# Patient Record
Sex: Female | Born: 1955 | Race: Black or African American | Hispanic: No | Marital: Married | Smoking: Never smoker
Health system: Southern US, Community
[De-identification: ages and names within clinical notes are randomized; demographics above are authoritative.]

## PROBLEM LIST (undated history)

## (undated) DIAGNOSIS — I10 Essential (primary) hypertension: Secondary | ICD-10-CM

## (undated) DIAGNOSIS — R51 Headache: Secondary | ICD-10-CM

## (undated) DIAGNOSIS — G43909 Migraine, unspecified, not intractable, without status migrainosus: Secondary | ICD-10-CM

## (undated) DIAGNOSIS — F32A Depression, unspecified: Secondary | ICD-10-CM

## (undated) DIAGNOSIS — Z9289 Personal history of other medical treatment: Secondary | ICD-10-CM

## (undated) DIAGNOSIS — R569 Unspecified convulsions: Secondary | ICD-10-CM

## (undated) DIAGNOSIS — R519 Headache, unspecified: Secondary | ICD-10-CM

## (undated) DIAGNOSIS — F329 Major depressive disorder, single episode, unspecified: Secondary | ICD-10-CM

## (undated) DIAGNOSIS — K259 Gastric ulcer, unspecified as acute or chronic, without hemorrhage or perforation: Secondary | ICD-10-CM

## (undated) DIAGNOSIS — R55 Syncope and collapse: Secondary | ICD-10-CM

## (undated) DIAGNOSIS — M1990 Unspecified osteoarthritis, unspecified site: Secondary | ICD-10-CM

## (undated) DIAGNOSIS — C50919 Malignant neoplasm of unspecified site of unspecified female breast: Secondary | ICD-10-CM

## (undated) HISTORY — DX: Malignant neoplasm of unspecified site of unspecified female breast: C50.919

## (undated) HISTORY — DX: Depression, unspecified: F32.A

## (undated) HISTORY — DX: Essential (primary) hypertension: I10

## (undated) HISTORY — DX: Major depressive disorder, single episode, unspecified: F32.9

## (undated) HISTORY — DX: Unspecified osteoarthritis, unspecified site: M19.90

## (undated) HISTORY — PX: ABDOMINAL HYSTERECTOMY: SHX81

## (undated) HISTORY — DX: Headache: R51

## (undated) HISTORY — DX: Personal history of other medical treatment: Z92.89

## (undated) HISTORY — DX: Migraine, unspecified, not intractable, without status migrainosus: G43.909

## (undated) HISTORY — DX: Syncope and collapse: R55

## (undated) HISTORY — DX: Headache, unspecified: R51.9

---

## 2013-06-09 ENCOUNTER — Ambulatory Visit: Payer: Self-pay | Admitting: Internal Medicine

## 2013-07-04 ENCOUNTER — Ambulatory Visit (INDEPENDENT_AMBULATORY_CARE_PROVIDER_SITE_OTHER)
Admission: RE | Admit: 2013-07-04 | Discharge: 2013-07-04 | Disposition: A | Discharge status: Home / Self Care | Payer: BC Managed Care – PPO | Source: Ambulatory Visit | Attending: Internal Medicine | Admitting: Internal Medicine

## 2013-07-04 ENCOUNTER — Encounter: Payer: Self-pay | Admitting: Internal Medicine

## 2013-07-04 ENCOUNTER — Ambulatory Visit (INDEPENDENT_AMBULATORY_CARE_PROVIDER_SITE_OTHER): Payer: BC Managed Care – PPO | Admitting: Internal Medicine

## 2013-07-04 VITALS — BP 162/110 | HR 81 | Temp 99.1°F | Ht 64.5 in | Wt 181.2 lb

## 2013-07-04 DIAGNOSIS — F411 Generalized anxiety disorder: Secondary | ICD-10-CM | POA: Insufficient documentation

## 2013-07-04 DIAGNOSIS — M1990 Unspecified osteoarthritis, unspecified site: Secondary | ICD-10-CM | POA: Insufficient documentation

## 2013-07-04 DIAGNOSIS — Z0000 Encounter for general adult medical examination without abnormal findings: Secondary | ICD-10-CM

## 2013-07-04 DIAGNOSIS — M25539 Pain in unspecified wrist: Secondary | ICD-10-CM

## 2013-07-04 DIAGNOSIS — R079 Chest pain, unspecified: Secondary | ICD-10-CM

## 2013-07-04 DIAGNOSIS — F329 Major depressive disorder, single episode, unspecified: Secondary | ICD-10-CM | POA: Insufficient documentation

## 2013-07-04 DIAGNOSIS — I10 Essential (primary) hypertension: Secondary | ICD-10-CM

## 2013-07-04 DIAGNOSIS — G43909 Migraine, unspecified, not intractable, without status migrainosus: Secondary | ICD-10-CM

## 2013-07-04 DIAGNOSIS — M25531 Pain in right wrist: Secondary | ICD-10-CM | POA: Insufficient documentation

## 2013-07-04 MED ORDER — NAPROXEN 500 MG PO TABS: 500.0000 mg | ORAL_TABLET | Freq: Two times a day (BID) | ORAL | Status: DC

## 2013-07-04 MED ORDER — ESCITALOPRAM OXALATE 10 MG PO TABS: 10.0000 mg | ORAL_TABLET | Freq: Every day | ORAL | Status: DC

## 2013-07-04 MED ORDER — ASPIRIN 81 MG PO TBEC: 81.0000 mg | DELAYED_RELEASE_TABLET | Freq: Every day | ORAL | Status: DC

## 2013-07-04 MED ORDER — SUMATRIPTAN SUCCINATE 100 MG PO TABS: 100.0000 mg | ORAL_TABLET | ORAL | Status: DC | PRN

## 2013-07-04 MED ORDER — LISINOPRIL 20 MG PO TABS: 20.0000 mg | ORAL_TABLET | Freq: Every day | ORAL | Status: DC

## 2013-07-04 NOTE — Patient Instructions (Addendum)
Your EKG was OK today Please take all new medication as prescribed- the antiinflammatory for pain, lisinopril for blood pressure, generic imitrex for migraine, and Lexapro for nerves Please also start Aspirin 81 mg per day (enteric coated only) to reduce risk of stroke and heart disease You will be contacted regarding the referral for: hand surgury Please go to the XRAY Department in the Basement (go straight as you get off the elevator) for the x-ray testing You will be contacted by phone if any changes need to be made immediately.  Otherwise, you will receive a letter about your results with an explanation, but please check with MyChart first.  Please remember to sign up for My Chart if you have not done so, as this will be important to you in the future with finding out test results, communicating by private email, and scheduling acute appointments online when needed.  Please return in 1 months, or sooner if needed, with Lab testing done 3-5 days before

## 2013-07-04 NOTE — Assessment & Plan Note (Signed)
For imitrex prn,  to f/u any worsening symptoms or concerns  

## 2013-07-04 NOTE — Assessment & Plan Note (Signed)
For lexapro 10 qd  Note:  Total time for pt hx, exam, review of record with pt in the room, determination of diagnoses and plan for further eval and tx is > 40 min, with over 50% spent in coordination and counseling of patient

## 2013-07-04 NOTE — Assessment & Plan Note (Signed)
Atypical, suspect MSK, for nsaid prn, ECG reviewed as per emr, and for cxr as well, with labs to be done prior  To next visit

## 2013-07-04 NOTE — Assessment & Plan Note (Signed)
To re-start med as before

## 2013-07-04 NOTE — Assessment & Plan Note (Signed)
Rather severe lateral wrist bony swelling, some soft tissue swelling - for nsaid, hand surgury referral, to f/u any worsening symptoms or concerns

## 2013-07-04 NOTE — Progress Notes (Signed)
  Subjective:    Patient ID: Mary [name], female    DOB: December 30, 1955, 57 y.o.   MRN: [id number]  HPI  Here to establish as new pt, c/o left lower anterior soreness/sharpness x 1 mo, mild, pleuritic, without fever, ST , cough, Pt denies chest pain, increased sob or doe, wheezing, orthopnea, PND, increased LE swelling, palpitations, dizziness or syncope, except for recent ankle fx x 3 yrs, used crutches, now with 2 knows to the right lateral chest near the armpit.  Not working, Has had mild worsening depressive symptoms x 2 yrs, without suicidal ideation, or panic; has ongoing anxiety, also increased recently. Treated years ago with paxil and another that did seem to help.  Does c/o ongoing fatigue, but denies signficant daytime hypersomnolence except for past 1 wk, does snore at night, no apnea per pt per husband.  Also with recurrent migraines, worse with more stress, has been freq for 6 mo, last tx years ago. No med use for BP for over 1 yr, first dx x 7 yrs, remembers being on one med - lisinopril from [facility]. Past Medical History  Diagnosis Date  . Arthritis   . Depression   . Fainting   . Headache   . Hypertension   . Migraines   . History of blood transfusion    No past surgical history on file.  reports that she has never smoked. She has never used smokeless tobacco. She reports that she does not drink alcohol or use illicit drugs. family history includes Arthritis in her other; Heart disease in her other; Hypertension in her others; and Stroke in her other. Allergies  Allergen Reactions  . Penicillins     swelling   No current outpatient prescriptions on file prior to visit.   No current facility-administered medications on file prior to visit.   Review of Systems  Constitutional: Negative for unexpected weight change, or unusual diaphoresis  HENT: Negative for tinnitus.   Eyes: Negative for photophobia and visual disturbance.  Respiratory: Negative for choking and stridor.    Gastrointestinal: Negative for vomiting and blood in stool.  Genitourinary: Negative for hematuria and decreased urine volume.  Musculoskeletal: Negative for acute joint swelling Skin: Negative for color change and wound.  Neurological: Negative for tremors and numbness other than noted  Psychiatric/Behavioral: Negative for decreased concentration or  hyperactivity.       Objective:   Physical Exam BP 162/110  Pulse 81  Temp(Src) 99.1 F (37.3 C) (Oral)  Ht 5' 4.5" (1.638 m)  Wt 181 lb 4 oz (82.214 kg)  BMI 30.64 kg/m2  SpO2 97% VS noted,  Constitutional: Pt appears well-developed and well-nourished.  HENT: Head: NCAT.  Right Ear: External ear normal.  Left Ear: External ear normal.  Eyes: Conjunctivae and EOM are normal. Pupils are equal, round, and reactive to light.  Neck: Normal range of motion. Neck supple.  Cardiovascular: Normal rate and regular rhythm.   Pulmonary/Chest: Effort normal and breath sounds normal.  Abd:  Soft, NT, non-distended, + BS No overt costal margin tender Right wrist with medial bony and soft tissue 2-3+ swelling, mild tender Neurological: Pt is alert. Not confused , motor 5/5, dtr intact Skin: Skin is warm. No erythema.  Psychiatric: Pt behavior is normal. Thought content normal. mild nervous, somewhat decrased affect    Assessment & Plan:

## 2013-08-04 ENCOUNTER — Ambulatory Visit: Payer: BC Managed Care – PPO

## 2013-08-04 ENCOUNTER — Other Ambulatory Visit (INDEPENDENT_AMBULATORY_CARE_PROVIDER_SITE_OTHER): Payer: BC Managed Care – PPO

## 2013-08-04 ENCOUNTER — Ambulatory Visit (INDEPENDENT_AMBULATORY_CARE_PROVIDER_SITE_OTHER): Payer: BC Managed Care – PPO | Admitting: Internal Medicine

## 2013-08-04 ENCOUNTER — Encounter: Payer: Self-pay | Admitting: Internal Medicine

## 2013-08-04 VITALS — BP 152/110 | HR 60 | Temp 98.5°F | Ht 64.0 in | Wt 178.0 lb

## 2013-08-04 DIAGNOSIS — Z0000 Encounter for general adult medical examination without abnormal findings: Secondary | ICD-10-CM

## 2013-08-04 DIAGNOSIS — Z23 Encounter for immunization: Secondary | ICD-10-CM

## 2013-08-04 DIAGNOSIS — I10 Essential (primary) hypertension: Secondary | ICD-10-CM

## 2013-08-04 DIAGNOSIS — D649 Anemia, unspecified: Secondary | ICD-10-CM

## 2013-08-04 LAB — URINALYSIS, ROUTINE W REFLEX MICROSCOPIC
Ketones, ur: NEGATIVE
Total Protein, Urine: NEGATIVE
Urine Glucose: NEGATIVE

## 2013-08-04 LAB — HEPATIC FUNCTION PANEL
Alkaline Phosphatase: 72 U/L (ref 39–117)
Bilirubin, Direct: 0.1 mg/dL (ref 0.0–0.3)
Total Bilirubin: 0.6 mg/dL (ref 0.3–1.2)

## 2013-08-04 LAB — BASIC METABOLIC PANEL
CO2: 29 mEq/L (ref 19–32)
Calcium: 9.7 mg/dL (ref 8.4–10.5)
Creatinine, Ser: 0.7 mg/dL (ref 0.4–1.2)
Sodium: 140 mEq/L (ref 135–145)

## 2013-08-04 LAB — CBC WITH DIFFERENTIAL/PLATELET
Basophils Absolute: 0 10*3/uL (ref 0.0–0.1)
Basophils Relative: 0.9 % (ref 0.0–3.0)
Eosinophils Absolute: 0.1 10*3/uL (ref 0.0–0.7)
HCT: 35 % — ABNORMAL LOW (ref 36.0–46.0)
Hemoglobin: 11.4 g/dL — ABNORMAL LOW (ref 12.0–15.0)
Lymphocytes Relative: 40.1 % (ref 12.0–46.0)
Lymphs Abs: 2.2 10*3/uL (ref 0.7–4.0)
MCHC: 32.7 g/dL (ref 30.0–36.0)
MCV: 88.8 fl (ref 78.0–100.0)
Monocytes Absolute: 0.5 10*3/uL (ref 0.1–1.0)
Neutro Abs: 2.6 10*3/uL (ref 1.4–7.7)
RBC: 3.94 Mil/uL (ref 3.87–5.11)
RDW: 14.6 % (ref 11.5–14.6)

## 2013-08-04 LAB — LIPID PANEL
HDL: 36.6 mg/dL — ABNORMAL LOW (ref >39.00)
LDL Cholesterol: 84 mg/dL (ref 0–99)
Total CHOL/HDL Ratio: 4
Triglycerides: 81 mg/dL (ref 0.0–149.0)

## 2013-08-04 MED ORDER — LISINOPRIL 40 MG PO TABS: 40.0000 mg | ORAL_TABLET | Freq: Every day | ORAL | Status: DC

## 2013-08-04 MED ORDER — AMLODIPINE BESYLATE 5 MG PO TABS: 5.0000 mg | ORAL_TABLET | Freq: Every day | ORAL | Status: DC

## 2013-08-04 NOTE — Patient Instructions (Addendum)
You had the tetanus shot today Please remember to followup with your GYN for the mammogram ( to consider [facility] on [address]) You will be contacted regarding the referral for: colonoscopy You had blood work done today You will be contacted by phone if any changes need to be made immediately.  Otherwise, you will receive a letter about your results with an explanation, but please check with MyChart first. Please take all new medication as prescribed - the increased lisinopril to 40 mg, and the amlodipine 5 mg per day Please continue all other medications as before, and refills have been done if requested. Please continue your efforts at being more active, low cholesterol diet, and weight control. You are otherwise up to date with prevention measures today.  Please return in 6 months

## 2013-08-04 NOTE — Assessment & Plan Note (Signed)

## 2013-08-04 NOTE — Assessment & Plan Note (Signed)
Uncontrolled, to incr lisinopril to 40 qd, add amlodpine, f/u BP at home , f/u next visit

## 2013-08-04 NOTE — Progress Notes (Signed)
Subjective:    Patient ID: Mary [name], female    DOB: [birth year], 57 y.o.   MRN: [id number]  HPI  Here for wellness and f/u;  Overall doing ok;  Pt denies CP, worsening SOB, DOE, wheezing, orthopnea, PND, worsening LE edema, palpitations, dizziness or syncope.  Pt denies neurological change such as new headache, facial or extremity weakness.  Pt denies polydipsia, polyuria, or low sugar symptoms. Pt states overall good compliance with treatment and medications, good tolerability, and has been trying to follow lower cholesterol diet.  Pt denies worsening depressive symptoms, suicidal ideation or panic. No fever, night sweats, wt loss, loss of appetite, or other constitutional symptoms.  Pt states good ability with ADL's, has low fall risk, home safety reviewed and adequate, no other significant changes in hearing or vision, and only occasionally active with exercise. Had labs done prior to visit, not available yet. Past Medical History  Diagnosis Date  . Arthritis   . Depression   . Fainting   . Headache   . Hypertension   . Migraines   . History of blood transfusion    No past surgical history on file.  reports that she has never smoked. She has never used smokeless tobacco. She reports that she does not drink alcohol or use illicit drugs. family history includes Arthritis in her other; Heart disease in her other; Hypertension in her others; and Stroke in her other. Allergies  Allergen Reactions  . Penicillins     swelling   Current Outpatient Prescriptions on File Prior to Visit  Medication Sig Dispense Refill  . aspirin 81 MG EC tablet Take 1 tablet (81 mg total) by mouth daily. Swallow whole.  30 tablet  12  . escitalopram (LEXAPRO) 10 MG tablet Take 1 tablet (10 mg total) by mouth daily.  90 tablet  3  . naproxen (NAPROSYN) 500 MG tablet Take 1 tablet (500 mg total) by mouth 2 (two) times daily with a meal.  60 tablet  3  . SUMAtriptan (IMITREX) 100 MG tablet Take 1 tablet (100 mg  total) by mouth every 2 (two) hours as needed for migraine.  10 tablet  3   No current facility-administered medications on file prior to visit.   Review of Systems  Constitutional: Negative for diaphoresis, activity change, appetite change or unexpected weight change.  HENT: Negative for hearing loss, ear pain, facial swelling, mouth sores and neck stiffness.   Eyes: Negative for pain, redness and visual disturbance.  Respiratory: Negative for shortness of breath and wheezing.   Cardiovascular: Negative for chest pain and palpitations.  Gastrointestinal: Negative for diarrhea, blood in stool, abdominal distention or other pain Genitourinary: Negative for hematuria, flank pain or change in urine volume.  Musculoskeletal: Negative for myalgias and joint swelling.  Skin: Negative for color change and wound.  Neurological: Negative for syncope and numbness. other than noted Hematological: Negative for adenopathy.  Psychiatric/Behavioral: Negative for hallucinations, self-injury, decreased concentration and agitation.      Objective:   Physical Exam BP 152/110  Pulse 60  Temp(Src) 98.5 F (36.9 C) (Oral)  Ht 5\' 4"  (1.626 m)  Wt 178 lb (80.74 kg)  BMI 30.54 kg/m2  SpO2 97% VS noted,  Constitutional: Pt is oriented to person, place, and time. Appears well-developed and well-nourished.  Head: Normocephalic and atraumatic.  Right Ear: External ear normal.  Left Ear: External ear normal.  Nose: Nose normal.  Mouth/Throat: Oropharynx is clear and moist.  Eyes: Conjunctivae and EOM are normal. Pupils  are equal, round, and reactive to light.  Neck: Normal range of motion. Neck supple. No JVD present. No tracheal deviation present.  Cardiovascular: Normal rate, regular rhythm, normal heart sounds and intact distal pulses.   Pulmonary/Chest: Effort normal and breath sounds normal.  Abdominal: Soft. Bowel sounds are normal. There is no tenderness. No HSM  Musculoskeletal: Normal range of  motion. Exhibits no edema.  Lymphadenopathy:  Has no cervical adenopathy.  Neurological: Pt is alert and oriented to person, place, and time. Pt has normal reflexes. No cranial nerve deficit.  Skin: Skin is warm and dry. No rash noted.  Psychiatric:  Has  normal mood and affect. Behavior is normal.     Assessment & Plan:

## 2013-08-09 ENCOUNTER — Telehealth: Payer: Self-pay | Admitting: Internal Medicine

## 2013-08-09 NOTE — Telephone Encounter (Signed)
rec'd records from [facility],Forward 4 page's to Dr. Jonny Ruiz

## 2013-08-16 ENCOUNTER — Encounter: Payer: Self-pay | Admitting: Internal Medicine

## 2013-08-22 ENCOUNTER — Telehealth: Payer: Self-pay

## 2013-08-22 MED ORDER — ESCITALOPRAM OXALATE 20 MG PO TABS: 20.0000 mg | ORAL_TABLET | Freq: Every day | ORAL | Status: DC

## 2013-08-22 NOTE — Telephone Encounter (Signed)
Ok to increase lexapro to 20 mg qd

## 2013-08-22 NOTE — Telephone Encounter (Signed)
The patient was in the office today with her sister and mom ([name] Eliberto Ivory and Zane Herald) and is requesting something to be sent to [facility] for her nerves.

## 2013-10-26 ENCOUNTER — Encounter: Payer: Self-pay | Admitting: Internal Medicine

## 2013-10-31 ENCOUNTER — Encounter: Payer: BC Managed Care – PPO | Admitting: Internal Medicine

## 2014-01-03 ENCOUNTER — Encounter: Payer: BC Managed Care – PPO | Admitting: Internal Medicine

## 2014-02-09 ENCOUNTER — Ambulatory Visit: Payer: BC Managed Care – PPO | Admitting: Internal Medicine

## 2014-04-03 ENCOUNTER — Ambulatory Visit: Payer: BC Managed Care – PPO | Admitting: Internal Medicine

## 2014-04-04 ENCOUNTER — Ambulatory Visit: Payer: BC Managed Care – PPO | Admitting: Internal Medicine

## 2014-04-04 DIAGNOSIS — Z0289 Encounter for other administrative examinations: Secondary | ICD-10-CM

## 2014-04-05 ENCOUNTER — Ambulatory Visit (INDEPENDENT_AMBULATORY_CARE_PROVIDER_SITE_OTHER): Payer: Medicaid Other | Admitting: Internal Medicine

## 2014-04-05 ENCOUNTER — Encounter (HOSPITAL_COMMUNITY): Payer: Self-pay | Admitting: Emergency Medicine

## 2014-04-05 ENCOUNTER — Inpatient Hospital Stay (HOSPITAL_COMMUNITY)
Admission: EM | Admit: 2014-04-05 | Discharge: 2014-04-10 | DRG: 871 | Disposition: A | Discharge status: Home / Self Care | Payer: BC Managed Care – PPO | Attending: Internal Medicine | Admitting: Internal Medicine

## 2014-04-05 ENCOUNTER — Encounter: Payer: Self-pay | Admitting: Internal Medicine

## 2014-04-05 ENCOUNTER — Ambulatory Visit (INDEPENDENT_AMBULATORY_CARE_PROVIDER_SITE_OTHER)
Admission: RE | Admit: 2014-04-05 | Discharge: 2014-04-05 | Disposition: A | Discharge status: Home / Self Care | Payer: BC Managed Care – PPO | Source: Ambulatory Visit | Attending: Internal Medicine | Admitting: Internal Medicine

## 2014-04-05 VITALS — BP 140/90 | HR 118 | Temp 100.3°F | Ht 64.0 in | Wt 169.2 lb

## 2014-04-05 DIAGNOSIS — Z823 Family history of stroke: Secondary | ICD-10-CM

## 2014-04-05 DIAGNOSIS — E43 Unspecified severe protein-calorie malnutrition: Secondary | ICD-10-CM

## 2014-04-05 DIAGNOSIS — R509 Fever, unspecified: Secondary | ICD-10-CM

## 2014-04-05 DIAGNOSIS — R109 Unspecified abdominal pain: Secondary | ICD-10-CM

## 2014-04-05 DIAGNOSIS — M1990 Unspecified osteoarthritis, unspecified site: Secondary | ICD-10-CM

## 2014-04-05 DIAGNOSIS — R5383 Other fatigue: Secondary | ICD-10-CM

## 2014-04-05 DIAGNOSIS — F411 Generalized anxiety disorder: Secondary | ICD-10-CM

## 2014-04-05 DIAGNOSIS — M25559 Pain in unspecified hip: Secondary | ICD-10-CM

## 2014-04-05 DIAGNOSIS — R188 Other ascites: Secondary | ICD-10-CM | POA: Diagnosis present

## 2014-04-05 DIAGNOSIS — M549 Dorsalgia, unspecified: Secondary | ICD-10-CM

## 2014-04-05 DIAGNOSIS — R7401 Elevation of levels of liver transaminase levels: Secondary | ICD-10-CM | POA: Diagnosis present

## 2014-04-05 DIAGNOSIS — M25551 Pain in right hip: Secondary | ICD-10-CM

## 2014-04-05 DIAGNOSIS — D72829 Elevated white blood cell count, unspecified: Secondary | ICD-10-CM

## 2014-04-05 DIAGNOSIS — R10817 Generalized abdominal tenderness: Secondary | ICD-10-CM

## 2014-04-05 DIAGNOSIS — C787 Secondary malignant neoplasm of liver and intrahepatic bile duct: Secondary | ICD-10-CM | POA: Diagnosis present

## 2014-04-05 DIAGNOSIS — F329 Major depressive disorder, single episode, unspecified: Secondary | ICD-10-CM | POA: Diagnosis present

## 2014-04-05 DIAGNOSIS — R42 Dizziness and giddiness: Secondary | ICD-10-CM | POA: Diagnosis present

## 2014-04-05 DIAGNOSIS — Z0000 Encounter for general adult medical examination without abnormal findings: Secondary | ICD-10-CM

## 2014-04-05 DIAGNOSIS — R5381 Other malaise: Secondary | ICD-10-CM | POA: Diagnosis present

## 2014-04-05 DIAGNOSIS — R7402 Elevation of levels of lactic acid dehydrogenase (LDH): Secondary | ICD-10-CM | POA: Diagnosis present

## 2014-04-05 DIAGNOSIS — D649 Anemia, unspecified: Secondary | ICD-10-CM | POA: Diagnosis present

## 2014-04-05 DIAGNOSIS — C7800 Secondary malignant neoplasm of unspecified lung: Secondary | ICD-10-CM | POA: Diagnosis present

## 2014-04-05 DIAGNOSIS — F3289 Other specified depressive episodes: Secondary | ICD-10-CM | POA: Diagnosis present

## 2014-04-05 DIAGNOSIS — R748 Abnormal levels of other serum enzymes: Secondary | ICD-10-CM | POA: Diagnosis present

## 2014-04-05 DIAGNOSIS — C7951 Secondary malignant neoplasm of bone: Secondary | ICD-10-CM | POA: Diagnosis present

## 2014-04-05 DIAGNOSIS — Z7982 Long term (current) use of aspirin: Secondary | ICD-10-CM

## 2014-04-05 DIAGNOSIS — Z8711 Personal history of peptic ulcer disease: Secondary | ICD-10-CM

## 2014-04-05 DIAGNOSIS — C7952 Secondary malignant neoplasm of bone marrow: Secondary | ICD-10-CM

## 2014-04-05 DIAGNOSIS — Z79899 Other long term (current) drug therapy: Secondary | ICD-10-CM

## 2014-04-05 DIAGNOSIS — N6310 Unspecified lump in the right breast, unspecified quadrant: Secondary | ICD-10-CM

## 2014-04-05 DIAGNOSIS — M8448XA Pathological fracture, other site, initial encounter for fracture: Secondary | ICD-10-CM | POA: Diagnosis present

## 2014-04-05 DIAGNOSIS — F32A Depression, unspecified: Secondary | ICD-10-CM

## 2014-04-05 DIAGNOSIS — N63 Unspecified lump in unspecified breast: Secondary | ICD-10-CM

## 2014-04-05 DIAGNOSIS — R55 Syncope and collapse: Secondary | ICD-10-CM

## 2014-04-05 DIAGNOSIS — N61 Mastitis without abscess: Secondary | ICD-10-CM

## 2014-04-05 DIAGNOSIS — R000 Tachycardia, unspecified: Secondary | ICD-10-CM | POA: Diagnosis present

## 2014-04-05 DIAGNOSIS — I10 Essential (primary) hypertension: Secondary | ICD-10-CM

## 2014-04-05 DIAGNOSIS — C50419 Malignant neoplasm of upper-outer quadrant of unspecified female breast: Secondary | ICD-10-CM | POA: Diagnosis present

## 2014-04-05 DIAGNOSIS — Z6828 Body mass index (BMI) 28.0-28.9, adult: Secondary | ICD-10-CM

## 2014-04-05 DIAGNOSIS — L0390 Cellulitis, unspecified: Secondary | ICD-10-CM | POA: Diagnosis present

## 2014-04-05 DIAGNOSIS — C779 Secondary and unspecified malignant neoplasm of lymph node, unspecified: Secondary | ICD-10-CM | POA: Diagnosis present

## 2014-04-05 DIAGNOSIS — R740 Nonspecific elevation of levels of transaminase and lactic acid dehydrogenase [LDH]: Secondary | ICD-10-CM

## 2014-04-05 DIAGNOSIS — A419 Sepsis, unspecified organism: Principal | ICD-10-CM

## 2014-04-05 DIAGNOSIS — G43909 Migraine, unspecified, not intractable, without status migrainosus: Secondary | ICD-10-CM

## 2014-04-05 HISTORY — DX: Gastric ulcer, unspecified as acute or chronic, without hemorrhage or perforation: K25.9

## 2014-04-05 LAB — COMPREHENSIVE METABOLIC PANEL
ALT: 44 U/L — ABNORMAL HIGH (ref 0–35)
AST: 85 U/L — ABNORMAL HIGH (ref 0–37)
Albumin: 2.9 g/dL — ABNORMAL LOW (ref 3.5–5.2)
Alkaline Phosphatase: 170 U/L — ABNORMAL HIGH (ref 39–117)
BUN: 17 mg/dL (ref 6–23)
CO2: 22 meq/L (ref 19–32)
CREATININE: 0.94 mg/dL (ref 0.50–1.10)
Calcium: 9.7 mg/dL (ref 8.4–10.5)
Chloride: 97 mEq/L (ref 96–112)
GFR calc Af Amer: 77 mL/min — ABNORMAL LOW (ref >90)
GFR, EST NON AFRICAN AMERICAN: 66 mL/min — AB (ref >90)
Glucose, Bld: 102 mg/dL — ABNORMAL HIGH (ref 70–99)
Potassium: 4.3 mEq/L (ref 3.7–5.3)
Sodium: 134 mEq/L — ABNORMAL LOW (ref 137–147)
Total Bilirubin: 1.2 mg/dL (ref 0.3–1.2)
Total Protein: 7.4 g/dL (ref 6.0–8.3)

## 2014-04-05 LAB — URINE MICROSCOPIC-ADD ON

## 2014-04-05 LAB — URINALYSIS, ROUTINE W REFLEX MICROSCOPIC
Glucose, UA: NEGATIVE mg/dL
Ketones, ur: 15 mg/dL — AB
NITRITE: NEGATIVE
Specific Gravity, Urine: 1.023 (ref 1.005–1.030)
UROBILINOGEN UA: 1 mg/dL (ref 0.0–1.0)
pH: 6 (ref 5.0–8.0)

## 2014-04-05 LAB — CBC WITH DIFFERENTIAL/PLATELET
Basophils Absolute: 0 10*3/uL (ref 0.0–0.1)
Basophils Relative: 0 % (ref 0–1)
EOS ABS: 0 10*3/uL (ref 0.0–0.7)
EOS PCT: 0 % (ref 0–5)
HCT: 27.8 % — ABNORMAL LOW (ref 36.0–46.0)
HEMOGLOBIN: 9.5 g/dL — AB (ref 12.0–15.0)
LYMPHS ABS: 1.3 10*3/uL (ref 0.7–4.0)
Lymphocytes Relative: 8 % — ABNORMAL LOW (ref 12–46)
MCH: 30.7 pg (ref 26.0–34.0)
MCHC: 34.2 g/dL (ref 30.0–36.0)
MCV: 90 fL (ref 78.0–100.0)
Monocytes Absolute: 2.2 10*3/uL — ABNORMAL HIGH (ref 0.1–1.0)
Monocytes Relative: 13 % — ABNORMAL HIGH (ref 3–12)
NEUTROS PCT: 79 % — AB (ref 43–77)
Neutro Abs: 13.1 10*3/uL — ABNORMAL HIGH (ref 1.7–7.7)
Platelets: 343 10*3/uL (ref 150–400)
RBC: 3.09 MIL/uL — AB (ref 3.87–5.11)
RDW: 14.6 % (ref 11.5–15.5)
WBC: 16.7 10*3/uL — ABNORMAL HIGH (ref 4.0–10.5)

## 2014-04-05 LAB — CBG MONITORING, ED: Glucose-Capillary: 92 mg/dL (ref 70–99)

## 2014-04-05 MED ORDER — ZOLPIDEM TARTRATE 5 MG PO TABS
5.0000 mg | ORAL_TABLET | Freq: Once | ORAL | Status: AC
  Administered 2014-04-05: 5 mg via ORAL
  Filled 2014-04-05: qty 1

## 2014-04-05 MED ORDER — SUMATRIPTAN SUCCINATE 100 MG PO TABS
100.0000 mg | ORAL_TABLET | ORAL | Status: DC | PRN
  Filled 2014-04-05: qty 1

## 2014-04-05 MED ORDER — SODIUM CHLORIDE 0.9 % IV SOLN
INTRAVENOUS | Status: DC
  Administered 2014-04-05 – 2014-04-09 (×7): via INTRAVENOUS
  Administered 2014-04-09: 75 mL via INTRAVENOUS

## 2014-04-05 MED ORDER — CLINDAMYCIN PHOSPHATE 600 MG/50ML IV SOLN
600.0000 mg | Freq: Three times a day (TID) | INTRAVENOUS | Status: DC
Start: 2014-04-05 — End: 2014-04-10
  Administered 2014-04-05 – 2014-04-10 (×14): 600 mg via INTRAVENOUS
  Filled 2014-04-05 (×16): qty 50

## 2014-04-05 MED ORDER — SODIUM CHLORIDE 0.9 % IV BOLUS (SEPSIS)
1000.0000 mL | Freq: Once | INTRAVENOUS | Status: AC
  Administered 2014-04-05: 1000 mL via INTRAVENOUS

## 2014-04-05 MED ORDER — LEVOFLOXACIN 500 MG PO TABS: 500.0000 mg | ORAL_TABLET | Freq: Every day | ORAL | Status: DC

## 2014-04-05 MED ORDER — ESCITALOPRAM OXALATE 20 MG PO TABS
20.0000 mg | ORAL_TABLET | Freq: Every day | ORAL | Status: DC
Start: 2014-04-05 — End: 2014-04-10
  Administered 2014-04-05 – 2014-04-10 (×6): 20 mg via ORAL
  Filled 2014-04-05 (×6): qty 1

## 2014-04-05 MED ORDER — CLINDAMYCIN PHOSPHATE 600 MG/50ML IV SOLN
600.0000 mg | Freq: Once | INTRAVENOUS | Status: AC
  Administered 2014-04-05: 600 mg via INTRAVENOUS
  Filled 2014-04-05: qty 50

## 2014-04-05 NOTE — Progress Notes (Signed)
Utilization Review completed.  Gary Bultman RN CM  

## 2014-04-05 NOTE — ED Notes (Addendum)
Pt c/o light headedness and weakness. Says "she has a tooth that needs to be fixed." States this has been the main reason for not eating and drinking. Arrived with one patent peripheral IV. Pt says it has been Saturday since she ate anything. A&O x4. Able to move herself over to stretcher. Husband, daughter, and sister at bedside. No c/o pain at this time. Right breast noted to be grossly swollen compared to left. Small bandage is in place over a circular 2cmx2cm yellow wound. Skin marked around entire wound. Patient denies pain with touch. Resting comfortably and waiting for MD.

## 2014-04-05 NOTE — Progress Notes (Signed)
[facility] CL spoke with patient about community resources. Patient stated that she had [employer] and was just waiting on card.

## 2014-04-05 NOTE — Progress Notes (Signed)
Pre visit review using our clinic review tool, if applicable. No additional management support is needed unless otherwise documented below in the visit note. 

## 2014-04-05 NOTE — Progress Notes (Addendum)
Subjective:    Patient ID: Mary [name], female    DOB: Oct 05, 1956, 58 y.o.   MRN: [id number]  HPI  Here for evalaution of acute symtpoms; approx 10 days ago recalls changing position with bending and felt a pop in her back, points to midline thoracic, and recalls several days of what sounds like signficant bilat paravertebral muscular stiffness that improved afterwards; today c/o 4 days feverish, decreased appetite, flushed feeling, weakness with standing, as well as right groin/hip pain with radiation to the right anterior thigh such that she has worsening ability to walk and stand as well, no Falls.  Not aware of specific fever.  Has had some diffuse abd pain as well but Denies urinary symptoms such as dysuria, frequency, urgency, flank pain, hematuria or n/v. No HA, ST, cough, and Pt denies chest pain, increased sob or doe, wheezing, orthopnea, PND, increased LE swelling, palpitations, dizziness or syncope.  Denies worsening reflux, dysphagia, n/v, diarrhea or blood. Past Medical History  Diagnosis Date  . Arthritis   . Depression   . Fainting   . Headache   . Hypertension   . Migraines   . History of blood transfusion    No past surgical history on file.  reports that she has never smoked. She has never used smokeless tobacco. She reports that she does not drink alcohol or use illicit drugs. family history includes Arthritis in her other; Heart disease in her other; Hypertension in her other and other; Stroke in her other. Allergies  Allergen Reactions  . Penicillins     swelling   Current Outpatient Prescriptions on File Prior to Visit  Medication Sig Dispense Refill  . amLODipine (NORVASC) 5 MG tablet Take 1 tablet (5 mg total) by mouth daily.  90 tablet  3  . aspirin 81 MG EC tablet Take 1 tablet (81 mg total) by mouth daily. Swallow whole.  30 tablet  12  . escitalopram (LEXAPRO) 20 MG tablet Take 1 tablet (20 mg total) by mouth daily.  90 tablet  3  . lisinopril  (PRINIVIL,ZESTRIL) 40 MG tablet Take 1 tablet (40 mg total) by mouth daily.  90 tablet  3  . naproxen (NAPROSYN) 500 MG tablet Take 1 tablet (500 mg total) by mouth 2 (two) times daily with a meal.  60 tablet  3  . SUMAtriptan (IMITREX) 100 MG tablet Take 1 tablet (100 mg total) by mouth every 2 (two) hours as needed for migraine.  10 tablet  3   No current facility-administered medications on file prior to visit.   Review of Systems  Constitutional: Negative for unexpected weight change, or unusual diaphoresis  HENT: Negative for tinnitus.   Eyes: Negative for photophobia and visual disturbance.  Respiratory: Negative for choking and stridor.   Gastrointestinal: Negative for vomiting and blood in stool.  Genitourinary: Negative for hematuria and decreased urine volume.  Musculoskeletal: Negative for acute joint swelling Skin: Negative for color change and wound.  Neurological: Negative for tremors and numbness other than noted  Psychiatric/Behavioral: Negative for decreased concentration or  hyperactivity.       Objective:   Physical Exam BP 140/90  Pulse 118  Temp(Src) 100.3 F (37.9 C) (Oral)  Ht 5\' 4"  (1.626 m)  Wt 169 lb 4 oz (76.771 kg)  BMI 29.04 kg/m2  SpO2 98% VS noted, mild ill, generally weak appearing but able to get on exam table, and sit up without assist Constitutional: Pt appears well-developed and well-nourished.  HENT: Head: NCAT.  Right Ear: External ear normal.  Left Ear: External ear normal.  Eyes: Conjunctivae and EOM are normal. Pupils are equal, round, and reactive to light.  Neck: Normal range of motion. Neck supple. No LA or masses Cardiovascular: mild increased rate and regular rhythm.   Pulmonary/Chest: Effort normal and breath sounds normal.  Abd:  Soft, non-distended, + BS but diffuse mild tender, ? Worse to low mid abd and epigastric Diffuse tender thoracic and lumbar spine in midline, ? Worst lower thoracic maybe t10 Has some mild left upper  buttock tender, none on right, no rash, erythema or swelling Right groin tender along the inguinal ligament area, no masses Passive Right hip flexion and int rotation elicit at least mod pain with decreased ROM Neurological: Pt is alert. Not confused, motor 5/5 , sens intact Skin: Skin is warm. No erythema. No LE edema Psychiatric: Pt behavior is normal. Thought content normal.     Assessment & Plan:

## 2014-04-05 NOTE — ED Notes (Signed)
Pt is aware that a urine sample is needed and will give one as soon as she can.

## 2014-04-05 NOTE — ED Notes (Signed)
Bed: PP50 Expected date:  Expected time:  Means of arrival:  Comments: ems- altered

## 2014-04-05 NOTE — ED Notes (Signed)
Lexapro not given due to medication not available yet from pharmacy.

## 2014-04-05 NOTE — H&P (Signed)
History and Physical   Mary [name]:[id number] DOB: Dec 06, 1956 DOA: 04/05/2014  Referring physician: Dr. Leonides Schanz PCP: [name], MD  Specialists: surgery  Chief Complaint: fever/weakness   HPI: Mary [name] is a 58 y.o. female has a past medical history significant for HTN, Migraines presents to the ED from her PCP office where she was febrile, lightheaded and had a near syncopal episode. She is complaining of a left breast redness that has appeared on Monday and has progressed since. She has a chronic left breast ulceration that has been there for a year or more, previously worked up in [place] and she was told it was nothing. Her ulceration has now started to have a drainage with the development of her redness. Endorses fevers at home and weakness. Patient denies chest pain, breathing difficulties. She has no GI or GU complaints. She has no history of DM. In the ED, patient normotensive but febrile, tachycardic and with Leukocytosis. General surgery has been consulted by EDP for evaluation and [facility] asked to admit.   Review of Systems: as per HPI otherwise negative  Past Medical History  Diagnosis Date  . Arthritis   . Depression   . Fainting   . Headache   . Hypertension   . Migraines   . History of blood transfusion   . Gastric ulcer    Past Surgical History  Procedure Laterality Date  . Abdominal hysterectomy  15 years go    partial   Social History:  reports that she has never smoked. She has never used smokeless tobacco. She reports that she does not drink alcohol or use illicit drugs.  Allergies  Allergen Reactions  . Hydrocodone     "makes me crawl on the floor"  . Penicillins     swelling    Family History  Problem Relation Age of Onset  . Arthritis Other   . Hypertension Other   . Hypertension Other   . Heart disease Other   . Stroke Other    Prior to Admission medications   Medication Sig Start Date End Date Taking? Authorizing Provider  amLODipine  (NORVASC) 5 MG tablet Take 1 tablet (5 mg total) by mouth daily. 08/04/13  Yes Biagio Borg, MD  aspirin 81 MG EC tablet Take 1 tablet (81 mg total) by mouth daily. Swallow whole. 07/04/13  Yes Biagio Borg, MD  escitalopram (LEXAPRO) 20 MG tablet Take 1 tablet (20 mg total) by mouth daily. 08/22/13  Yes Biagio Borg, MD  lisinopril (PRINIVIL,ZESTRIL) 40 MG tablet Take 1 tablet (40 mg total) by mouth daily. 08/04/13  Yes Biagio Borg, MD  naproxen (NAPROSYN) 500 MG tablet Take 1 tablet (500 mg total) by mouth 2 (two) times daily with a meal. 07/04/13  Yes Biagio Borg, MD  SUMAtriptan (IMITREX) 100 MG tablet Take 1 tablet (100 mg total) by mouth every 2 (two) hours as needed for migraine. 07/04/13  Yes Biagio Borg, MD  levofloxacin (LEVAQUIN) 500 MG tablet Take 1 tablet (500 mg total) by mouth daily. 04/05/14   Biagio Borg, MD   Physical Exam: Filed Vitals:   04/05/14 1439 04/05/14 1442 04/05/14 1443 04/05/14 1500  BP: 138/76 141/75 136/80 131/76  Pulse: 104 98 118 97  Temp:      Resp:    18  Height:      Weight:      SpO2:    96%    General:  No apparent distress  Eyes: PERRL,  EOMI, no scleral icterus  ENT: moist oropharynx  Neck: supple, no JVD  Cardiovascular: regular rate without MRG; 2+ peripheral pulses  Respiratory: CTA biL, good air movement without wheezing, rhonchi or crackled  Abdomen: soft, non tender to palpation, positive bowel sounds, no guarding, no rebound  Skin: right breast with 1 cm ulceration with drainage, extensive cellulitic changes, marked, picture taken in ED note. ~4x4 cm collection/mass felt under ulceration.   Musculoskeletal: no peripheral edema  Psychiatric: normal mood and affect  Neurologic: CN 2-12 grossly intact, MS 5/5 in all 4  Labs on Admission:  Basic Metabolic Panel:  Recent Labs Lab 04/05/14 1410  NA 134*  K 4.3  CL 97  CO2 22  GLUCOSE 102*  BUN 17  CREATININE 0.94  CALCIUM 9.7   Liver Function Tests:  Recent Labs Lab  04/05/14 1410  AST 85*  ALT 44*  ALKPHOS 170*  BILITOT 1.2  PROT 7.4  ALBUMIN 2.9*   CBC:  Recent Labs Lab 04/05/14 1410  WBC 16.7*  NEUTROABS 13.1*  HGB 9.5*  HCT 27.8*  MCV 90.0  PLT 343   Radiological Exams on Admission: Dg Thoracic Spine 2 View  04/05/2014   CLINICAL DATA:  Back pain  EXAM: THORACIC SPINE - 2 VIEW  COMPARISON:  CXR 07/04/2013  FINDINGS: There is no evidence of thoracic spine fracture. Alignment is normal. No other significant bone abnormalities are identified.  IMPRESSION: Negative.   Electronically Signed   By: Franchot Gallo M.D.   On: 04/05/2014 14:20    Assessment/Plan Principal Problem:   Sepsis Active Problems:   Hypertension   Fever, unspecified   Cellulitis   Migraine headache   Elevated liver enzymes    Sepsis due to cellulitis - with elevated WBC, fever, tachycardia. She is pen allergic, not a known diabetic, will start IV clindamycin, obtain blood cultures. Contours drawn in ED. There is underlying mass wonder if this is infectious related or an underlying mass, I am concerned about the chronic ulceration that the patient described and the skin appearance. Will probably need some form of imaging, appreciate surgery input also. I understand that we cannot obtain breast US as an inpatient, appreciate surgery input regarding alternatives.  Cellulitis - as above. Will check A1C to evaluate underlying DM. HTN - hold home antihypertensives for tonight Migraine HA - Imitrex PRN.  Mild transaminitis - unclear etiology, will repeat in am and if persistently high consider RUQ [place]   Diet: regular Fluids: SN DVT Prophylaxis: SCDs  Code Status: Full  Family Communication: family bedside  Disposition Plan: inpatient  Time spent: 53  This note has been created with Surveyor, quantity. Any transcriptional errors are unintentional.   Costin M. Cruzita Lederer, MD [facility] Pager [phone]  If  7PM-7AM, please contact night-coverage [email or web address] Password [email or web address] 04/05/2014, 4:31 PM

## 2014-04-05 NOTE — ED Provider Notes (Signed)
CSN: [id number]     Arrival date & time 04/05/14  1312 History   First MD Initiated Contact with Patient 04/05/14 1344     Chief Complaint  Patient presents with  . Weakness  . Near Syncope     (Consider location/radiation/quality/duration/timing/severity/associated sxs/prior Treatment) Patient is a 58 y.o. female presenting with near-syncope and rash. The history is provided by the patient and a relative.  Near Syncope This is a new problem. The current episode started 1 to 2 hours ago. The problem occurs rarely. The problem has been resolved. Pertinent negatives include no chest pain, no abdominal pain, no headaches and no shortness of breath. The symptoms are aggravated by standing. The symptoms are relieved by lying down. She has tried nothing for the symptoms. The treatment provided no relief.  Rash Location:  Torso Torso rash location:  R chest Quality: draining, redness and swelling   Severity:  Moderate Onset quality: reports small bump at site for 4 years, suddenly worse for 1 week. Duration:  1 week Timing:  Constant Progression:  Worsening Chronicity:  New Relieved by:  Nothing Ineffective treatments:  None tried Associated symptoms: fatigue and fever   Associated symptoms: no abdominal pain, no diarrhea, no headaches, no joint pain, no nausea, no shortness of breath, no sore throat and not vomiting     Past Medical History  Diagnosis Date  . Arthritis   . Depression   . Fainting   . Headache   . Hypertension   . Migraines   . History of blood transfusion    Past Surgical History  Procedure Laterality Date  . Abdominal hysterectomy  15 years go    partial   Family History  Problem Relation Age of Onset  . Arthritis Other   . Hypertension Other   . Hypertension Other   . Heart disease Other   . Stroke Other    History  Substance Use Topics  . Smoking status: Never Smoker   . Smokeless tobacco: Never Used  . Alcohol Use: No   OB History   Grav Para  Term Preterm Abortions TAB SAB Ect Mult Living                 Review of Systems  Constitutional: Positive for fever, activity change, appetite change and fatigue. Negative for chills and diaphoresis.  HENT: Negative for congestion, facial swelling, rhinorrhea and sore throat.   Eyes: Negative for photophobia and discharge.  Respiratory: Negative for cough, chest tightness and shortness of breath.   Cardiovascular: Positive for near-syncope. Negative for chest pain, palpitations and leg swelling.  Gastrointestinal: Negative for nausea, vomiting, abdominal pain and diarrhea.  Endocrine: Negative for polydipsia and polyuria.  Genitourinary: Negative for dysuria, frequency, difficulty urinating and pelvic pain.  Musculoskeletal: Negative for arthralgias, back pain, neck pain and neck stiffness.  Skin: Positive for rash and wound. Negative for color change.  Allergic/Immunologic: Negative for immunocompromised state.  Neurological: Positive for weakness and light-headedness. Negative for facial asymmetry, numbness and headaches.  Hematological: Does not bruise/bleed easily.  Psychiatric/Behavioral: Negative for confusion and agitation.      Allergies  Hydrocodone and Penicillins  Home Medications   Current Outpatient Rx  Name  Route  Sig  Dispense  Refill  . amLODipine (NORVASC) 5 MG tablet   Oral   Take 1 tablet (5 mg total) by mouth daily.   90 tablet   3   . aspirin 81 MG EC tablet   Oral   Take 1  tablet (81 mg total) by mouth daily. Swallow whole.   30 tablet   12   . escitalopram (LEXAPRO) 20 MG tablet   Oral   Take 1 tablet (20 mg total) by mouth daily.   90 tablet   3   . lisinopril (PRINIVIL,ZESTRIL) 40 MG tablet   Oral   Take 1 tablet (40 mg total) by mouth daily.   90 tablet   3   . naproxen (NAPROSYN) 500 MG tablet   Oral   Take 1 tablet (500 mg total) by mouth 2 (two) times daily with a meal.   60 tablet   3   . SUMAtriptan (IMITREX) 100 MG tablet    Oral   Take 1 tablet (100 mg total) by mouth every 2 (two) hours as needed for migraine.   10 tablet   3   . levofloxacin (LEVAQUIN) 500 MG tablet   Oral   Take 1 tablet (500 mg total) by mouth daily.   10 tablet   0    BP 131/76  Pulse 97  Temp(Src) 99.5 F (37.5 C)  Resp 18  Ht 5\' 4"  (1.626 m)  Wt 169 lb (76.658 kg)  BMI 28.99 kg/m2  SpO2 96% Physical Exam  Constitutional: She is oriented to person, place, and time. She appears well-developed and well-nourished. No distress.  HENT:  Head: Normocephalic and atraumatic.  Mouth/Throat: No oropharyngeal exudate.  Eyes: Pupils are equal, round, and reactive to light.  Neck: Normal range of motion. Neck supple.  Cardiovascular: Normal rate, regular rhythm and normal heart sounds.  Exam reveals no gallop and no friction rub.   No murmur heard. Pulmonary/Chest: Effort normal and breath sounds normal. No respiratory distress. She has no wheezes. She has no rales. Right breast exhibits mass and skin change. Right breast exhibits no tenderness. Breasts are asymmetrical.    Abdominal: Soft. Bowel sounds are normal. She exhibits no distension and no mass. There is no tenderness. There is no rebound and no guarding.  Musculoskeletal: Normal range of motion. She exhibits no edema and no tenderness.  Neurological: She is alert and oriented to person, place, and time.  Skin: Skin is warm and dry.  Psychiatric: She has a normal mood and affect.         ED Course  Procedures (including critical care time) Labs Review Labs Reviewed  COMPREHENSIVE METABOLIC PANEL - Abnormal; Notable for the following:    Sodium 134 (*)    Glucose, Bld 102 (*)    Albumin 2.9 (*)    AST 85 (*)    ALT 44 (*)    Alkaline Phosphatase 170 (*)    GFR calc non Af Amer 66 (*)    GFR calc Af Amer 77 (*)    All other components within normal limits  CBC WITH DIFFERENTIAL - Abnormal; Notable for the following:    WBC 16.7 (*)    RBC 3.09 (*)     Hemoglobin 9.5 (*)    HCT 27.8 (*)    Neutrophils Relative % 79 (*)    Neutro Abs 13.1 (*)    Lymphocytes Relative 8 (*)    Monocytes Relative 13 (*)    Monocytes Absolute 2.2 (*)    All other components within normal limits  URINE CULTURE  URINALYSIS, ROUTINE W REFLEX MICROSCOPIC   Imaging Review Dg Thoracic Spine 2 View  04/05/2014   CLINICAL DATA:  Back pain  EXAM: THORACIC SPINE - 2 VIEW  COMPARISON:  CXR 07/04/2013  FINDINGS: There is no evidence of thoracic spine fracture. Alignment is normal. No other significant bone abnormalities are identified.  IMPRESSION: Negative.   Electronically Signed   By: Franchot Gallo M.D.   On: 04/05/2014 14:20     EKG Interpretation   Date/Time:  Thursday April 05 2014 14:15:57 EDT Ventricular Rate:  102 PR Interval:  165 QRS Duration: 94 QT Interval:  346 QTC Calculation: 451 R Axis:   74 Text Interpretation:  Sinus tachycardia Baseline wander in lead(s) V6 no  prior for comparison Confirmed by DOCHERTY  MD, MEGAN ([id number]) on 04/05/2014  4:15:57 PM      MDM   Final diagnoses:  Breast mass, right  Cellulitis of breast  Near syncope    Pt is a 58 y.o. female with Pmhx as above who presents with near syncopal episode during PCP appointment. She complains of about 1 week of inc swelling, redness around L lateral breast lesion which had been present for about 4 years, but is now draining purulent fluid. She has also had intermittent fevers, chills, and anorexia. On PE, pt febrile 100.3, tachycardic at 118. R breast exam as above with large, irregular mass with purulent drainage and irregularly shaped underlying fluctuant area. Breast is grossly enlarged compared w/ L, demonstrates erythema, warmth, drainage from large central lesion but pt denies pain w/ palpation.  Pt has skin changes c/w cellulitis, but central lesion concerning for breast mass +/- abscess formation. IV clinda given. Sen surg consulted, rec [facility] for admission for tx of  cellulitis.         Neta Ehlers, MD 04/05/14 [phone]

## 2014-04-05 NOTE — Assessment & Plan Note (Signed)
Etiology unclear, exam as documented, consider hip MRI

## 2014-04-05 NOTE — Assessment & Plan Note (Signed)
Difficult to know of relevance as onset of at least some her back pain occurred prior to onset fever aspect, suspect MSK strain, but cant r/o other such as discitis or other given fever

## 2014-04-05 NOTE — Assessment & Plan Note (Addendum)
Worst tender mid low abdomen, but also LLQ and epigastric to my exam less so, also some diffuse tender as well; for eval as above, if UA neg for obvious infection, would likely need abd/pelvic ct if bun/cr reasonable

## 2014-04-05 NOTE — ED Notes (Signed)
Patient requesting to eat. MD advised against this due to general surgery consult. General surgery in to assess patient. Will check BG and continue to monitor. Sister and mother at bedside also informed of plan.

## 2014-04-05 NOTE — Patient Instructions (Signed)
Please take all new medication as prescribed - the antibiotic, and pain medication  Please continue all other medications as before, and refills have been done if requested. Please have the pharmacy call with any other refills you may need.  Please go to the LAB in the Basement (turn left off the elevator) for the tests to be done today  Please go to the XRAY Department in the Basement (go straight as you get off the elevator) for the x-ray testing  You will be contacted by phone if any changes need to be made immediately.  Otherwise, you will receive a letter about your results with an explanation, but please check with MyChart first.  Please remember to sign up for MyChart if you have not done so, as this will be important to you in the future with finding out test results, communicating by private email, and scheduling acute appointments online when needed.  Please return in 1 week or less if not improved, or consider going to ER if any worsening fever, chills, abd pain or other pain, falls or other unsual symptoms

## 2014-04-05 NOTE — ED Notes (Signed)
Attempted to call report. Receiving nurse unable to take report at this time and will call back when able.

## 2014-04-05 NOTE — Progress Notes (Signed)
Mary [name] [birth year]  [id number].    Requesting MD: Dr. Tawnya Crook Chief Complaint/Reason for Consult: Right breast cellulitis  HPI:  58 y/o female who reported to her PCP at [facility] for fevers/chills over the last week.  On Sunday she started to feel ill.  Incidentally her sister pointed out that the patient has an area of redness on her right breast, and pus draining from a sore on her right axilla.  She says it started on Monday and started around the sore in the axilla that has been there for 4 years or more.  She says she had it looked at by her PCP in the past who did not recommend any follow up.  She says over the last few days the redness has spread and the are is hard and warm.  The axilla wound is draining yellow purulent drainage.  +fever/chills, fatigue, anorexia, and near syncope/lightheadedness/weakenss.  Denies chest pain/SOB, headache, abdominal pain, myalgias/arthralgias, rashes, denies any known lymphadenopathy.  She says her other breast is completely normal.  Mild pain radiates across the right chest wall and axilla, standing makes the pain worse, lying down improves the pain.  Her last mammogram was maybe 2 years ago which she says was normal.  She had an abdominal hysterectomy, but ovaries were spared.  She denies any history of cancer in the family including breast cancer.  She has been dressing it to collect the drainage but thought it would go away.  She says the axilla sore has intermittently become inflamed and enlarged, but always resolves down to about 1cm.    ROS: All systems reviewed and otherwise negative except for as above  Family History  Problem Relation Age of Onset  . Arthritis Other   . Hypertension Other   . Hypertension Other   . Heart disease Other   . Stroke Other     Past Medical History  Diagnosis Date  . Arthritis   . Depression   . Fainting   . Headache   . Hypertension   . Migraines   . History of blood transfusion   . Gastric ulcer      Past Surgical History  Procedure Laterality Date  . Abdominal hysterectomy  15 years go    partial    Social History:  reports that she has never smoked. She has never used smokeless tobacco. She reports that she does not drink alcohol or use illicit drugs.  Allergies:  Allergies  Allergen Reactions  . Hydrocodone     "makes me crawl on the floor"  . Penicillins     swelling     (Not in a hospital admission)  Blood pressure 131/76, pulse 97, temperature 99.5 F (37.5 C), resp. rate 18, height 5' 4"  (1.626 m), weight 169 lb (76.658 kg), SpO2 96.00%. Physical Exam: General: pleasant, WD/WN AA female who is laying in bed in NAD HEENT: head is normocephalic, atraumatic.  Sclera are noninjected.  PERRL.  Ears and nose without any masses or lesions.  Mouth is pink and moist Breast:  Right breast with large irregularly shaped are of erythema and hard induration from the right axilla across the right breast to the sternum and inferiorly to the bra line.  There is noted peau d'orange around the areola.  Tenderness throughout.  There is a hard nodular area in the right axilla which is draining purulent drainage.  Heart: regular, rate, and rhythm.  No obvious murmurs, gallops, or rubs noted.  Palpable pedal pulses bilaterally Lungs: CTAB, no  wheezes, rhonchi, or rales noted.  Respiratory effort nonlabored Abd: soft, NT/ND, +BS, no masses, hernias, or organomegaly MS: all 4 extremities are symmetrical with no cyanosis, clubbing, or edema. Skin: Otherwise warm and dry with no masses, lesions, or rashes Psych: A&Ox3 with an appropriate affect. Neuro: CM 2-12 intact, extremity CSM intact bilaterally, normal speech   04/05/14  04/05/14   Results for orders placed during the hospital encounter of 04/05/14 (from the past 48 hour(s))  COMPREHENSIVE METABOLIC PANEL     Status: Abnormal   Collection Time    04/05/14  2:10 PM      Result Value Ref Range   Sodium 134 (*) 137 - 147 mEq/L    Potassium 4.3  3.7 - 5.3 mEq/L   Chloride 97  96 - 112 mEq/L   CO2 22  19 - 32 mEq/L   Glucose, Bld 102 (*) 70 - 99 mg/dL   BUN 17  6 - 23 mg/dL   Creatinine, Ser 0.94  0.50 - 1.10 mg/dL   Calcium 9.7  8.4 - 10.5 mg/dL   Total Protein 7.4  6.0 - 8.3 g/dL   Albumin 2.9 (*) 3.5 - 5.2 g/dL   AST 85 (*) 0 - 37 U/L   ALT 44 (*) 0 - 35 U/L   Alkaline Phosphatase 170 (*) 39 - 117 U/L   Total Bilirubin 1.2  0.3 - 1.2 mg/dL   GFR calc non Af Amer 66 (*) >90 mL/min   GFR calc Af Amer 77 (*) >90 mL/min   Comment: (NOTE)     The eGFR has been calculated using the CKD EPI equation.     This calculation has not been validated in all clinical situations.     eGFR's persistently <90 mL/min signify possible Chronic Kidney     Disease.  CBC WITH DIFFERENTIAL     Status: Abnormal   Collection Time    04/05/14  2:10 PM      Result Value Ref Range   WBC 16.7 (*) 4.0 - 10.5 K/uL   RBC 3.09 (*) 3.87 - 5.11 MIL/uL   Hemoglobin 9.5 (*) 12.0 - 15.0 g/dL   HCT 27.8 (*) 36.0 - 46.0 %   MCV 90.0  78.0 - 100.0 fL   MCH 30.7  26.0 - 34.0 pg   MCHC 34.2  30.0 - 36.0 g/dL   RDW 14.6  11.5 - 15.5 %   Platelets 343  150 - 400 K/uL   Neutrophils Relative % 79 (*) 43 - 77 %   Neutro Abs 13.1 (*) 1.7 - 7.7 K/uL   Lymphocytes Relative 8 (*) 12 - 46 %   Lymphs Abs 1.3  0.7 - 4.0 K/uL   Monocytes Relative 13 (*) 3 - 12 %   Monocytes Absolute 2.2 (*) 0.1 - 1.0 K/uL   Eosinophils Relative 0  0 - 5 %   Eosinophils Absolute 0.0  0.0 - 0.7 K/uL   Basophils Relative 0  0 - 1 %   Basophils Absolute 0.0  0.0 - 0.1 K/uL   Dg Thoracic Spine 2 View  04/05/2014   CLINICAL DATA:  Back pain  EXAM: THORACIC SPINE - 2 VIEW  COMPARISON:  CXR 07/04/2013  FINDINGS: There is no evidence of thoracic spine fracture. Alignment is normal. No other significant bone abnormalities are identified.  IMPRESSION: Negative.   Electronically Signed   By: Franchot Gallo M.D.   On: 04/05/2014 14:20      Assessment/Plan Right breast  cellulitis ?  abscess Unhealed wound of 4 years Leukocytosis Fever  Plan: 1.  Admit to medicine for cellulitis management - IV antibiotics, pain control 2.  I'm concerned for inflammatory breast cancer given peau de orange.  Will need to control the cellulitis and hopefully discharge for further imaging at the breast center including mammogram and ultrasound for definitive management and additional workup including possibly biopsy.   3.  Diet as tolerated as no surgery is planned for today or tomorrow, IVF, pain control, antiemetics, antibiotics 4.  Dr. Hassell Done or Dr. Lucia Gaskins to see later today and give further recommendations.  Have not discussed the possibility of malignancy with the patient as of yet. 5.  Talked to Dr. Cruzita Lederer and he wonders whether a breast MRI would be indicated.  I will let Dr. Lucia Gaskins take a look and see if he feels that would be beneficial.     Coralie Keens, [facility] 04/05/2014, 4:32 PM Pager: [phone]  I have not seen the patient.  Dr. Hassell Done is DOW and I assume is following the patient.  The was sent to me for cosign by Coralie Keens.  Alphonsa Overall, MD, [facility] Pager: [phone] Office phone:  [phone]

## 2014-04-05 NOTE — ED Notes (Signed)
MD Docherty in to do [place] of patient's right breast. Family and patient aware.

## 2014-04-05 NOTE — Assessment & Plan Note (Addendum)
Pt is at least mild to mod ill with febrile illness, and otherwise difficult and confusing hx and exam as far as determining a source.  Will start with initial working diagnosis of possible UTI, though diff would include numerous other, including diverticulitis, renal stone, PSBO, discitis, or even AVN or other hip disease.  To check labs including UA, esr and blood culture, gave empiric levaquin; as well as plain films of t-spine/ls spine though admittedly MRI of same would elicit more information;  If UA negative, would likely need to pursue other more aggressive eval including abd/pelvic ct, Spine MRI or even right hip MRI  Note:  Total time for pt hx, exam, review of record with pt in the room, determination of diagnoses and plan for further eval and tx is > 40 min, with over 50% spent in coordination and counseling of patient

## 2014-04-05 NOTE — ED Notes (Signed)
Per EMS- patient has had generalized weakness and light headedness starting Saturday April 4. Went to [facility] today with near syncope during an appt. Last vitals BP 137/94 HR 96 BG 135 RR 18. 12 lead unremarkable per EMS. Report received from [facility].

## 2014-04-06 ENCOUNTER — Inpatient Hospital Stay (HOSPITAL_COMMUNITY): Payer: BC Managed Care – PPO

## 2014-04-06 DIAGNOSIS — E43 Unspecified severe protein-calorie malnutrition: Secondary | ICD-10-CM | POA: Insufficient documentation

## 2014-04-06 LAB — URINE CULTURE: Colony Count: >=100000

## 2014-04-06 LAB — COMPREHENSIVE METABOLIC PANEL
ALBUMIN: 2.5 g/dL — AB (ref 3.5–5.2)
ALK PHOS: 161 U/L — AB (ref 39–117)
ALT: 37 U/L — ABNORMAL HIGH (ref 0–35)
AST: 85 U/L — ABNORMAL HIGH (ref 0–37)
BILIRUBIN TOTAL: 1.2 mg/dL (ref 0.3–1.2)
BUN: 13 mg/dL (ref 6–23)
CO2: 19 mEq/L (ref 19–32)
Calcium: 9 mg/dL (ref 8.4–10.5)
Chloride: 102 mEq/L (ref 96–112)
Creatinine, Ser: 0.68 mg/dL (ref 0.50–1.10)
GFR calc Af Amer: >90 mL/min (ref >90)
GFR calc non Af Amer: >90 mL/min (ref >90)
GLUCOSE: 96 mg/dL (ref 70–99)
POTASSIUM: 3.7 meq/L (ref 3.7–5.3)
Sodium: 136 mEq/L — ABNORMAL LOW (ref 137–147)
Total Protein: 6.5 g/dL (ref 6.0–8.3)

## 2014-04-06 LAB — CBC
HCT: 24 % — ABNORMAL LOW (ref 36.0–46.0)
HEMOGLOBIN: 8.4 g/dL — AB (ref 12.0–15.0)
MCH: 30.8 pg (ref 26.0–34.0)
MCHC: 35 g/dL (ref 30.0–36.0)
MCV: 87.9 fL (ref 78.0–100.0)
Platelets: 337 10*3/uL (ref 150–400)
RBC: 2.73 MIL/uL — AB (ref 3.87–5.11)
RDW: 14.6 % (ref 11.5–15.5)
WBC: 14.8 10*3/uL — ABNORMAL HIGH (ref 4.0–10.5)

## 2014-04-06 LAB — HEMOGLOBIN A1C
HEMOGLOBIN A1C: 5.3 % (ref <5.7)
Mean Plasma Glucose: 105 mg/dL (ref <117)

## 2014-04-06 MED ORDER — OXYCODONE HCL 5 MG PO TABS
5.0000 mg | ORAL_TABLET | Freq: Four times a day (QID) | ORAL | Status: DC | PRN
  Administered 2014-04-06 – 2014-04-10 (×5): 5 mg via ORAL
  Filled 2014-04-06 (×5): qty 1

## 2014-04-06 MED ORDER — BOOST PLUS PO LIQD
237.0000 mL | Freq: Two times a day (BID) | ORAL | Status: DC
  Administered 2014-04-06 – 2014-04-10 (×5): 237 mL via ORAL
  Filled 2014-04-06 (×9): qty 237

## 2014-04-06 NOTE — Progress Notes (Signed)
Subjective: Pt feels much better.  No pain, no fevers/chills.  Tolerating regular diet although appetite low.  Urinating well.    Objective: Vital signs in last 24 hours: Temp:  [98.6 F (37 C)-100.4 F (38 C)] 98.6 F (37 C) (04/10 0536) Pulse Rate:  [95-118] 98 (04/10 0536) Resp:  [15-23] 18 (04/10 0536) BP: (119-155)/(64-87) 138/79 mmHg (04/10 0536) SpO2:  [95 %-100 %] 98 % (04/10 0536) Weight:  [169 lb (76.658 kg)] 169 lb (76.658 kg) (04/09 1320) Last BM Date: 04/11/14  Intake/Output from previous day: 04/09 0701 - 04/10 0700 In: 952.5 [I.V.:852.5; IV Piggyback:100] Out: -  Intake/Output this shift:    PE: Gen:  Alert, NAD, pleasant Breast: Right breast with large irregularly shaped are of erythema and hard induration from the right axilla across the right breast to the sternum and inferiorly to the bra line.  The erythema is more patchy (improving) around the marked area.  The erythema seems to be receding back.  There is noted peau d'orange around the areola. Tenderness throughout. There is a hard nodular area in the right axilla which is not currently draining.    Lab Results:   Recent Labs  04/05/14 1410 04/06/14 0540  WBC 16.7* 14.8*  HGB 9.5* 8.4*  HCT 27.8* 24.0*  PLT 343 337   BMET  Recent Labs  04/05/14 1410 04/06/14 0540  NA 134* 136*  K 4.3 3.7  CL 97 102  CO2 22 19  GLUCOSE 102* 96  BUN 17 13  CREATININE 0.94 0.68  CALCIUM 9.7 9.0   PT/INR No results found for this basename: LABPROT, INR,  in the last 72 hours CMP     Component Value Date/Time   NA 136* 04/06/2014 0540   K 3.7 04/06/2014 0540   CL 102 04/06/2014 0540   CO2 19 04/06/2014 0540   GLUCOSE 96 04/06/2014 0540   BUN 13 04/06/2014 0540   CREATININE 0.68 04/06/2014 0540   CALCIUM 9.0 04/06/2014 0540   PROT 6.5 04/06/2014 0540   ALBUMIN 2.5* 04/06/2014 0540   AST 85* 04/06/2014 0540   ALT 37* 04/06/2014 0540   ALKPHOS 161* 04/06/2014 0540   BILITOT 1.2 04/06/2014 0540   GFRNONAA  >90 04/06/2014 0540   GFRAA >90 04/06/2014 0540   Lipase  No results found for this basename: lipase       Studies/Results: Dg Thoracic Spine 2 View  04/05/2014   CLINICAL DATA:  Back pain  EXAM: THORACIC SPINE - 2 VIEW  COMPARISON:  CXR 07/04/2013  FINDINGS: There is no evidence of thoracic spine fracture. Alignment is normal. No other significant bone abnormalities are identified.  IMPRESSION: Negative.   Electronically Signed   By: Franchot Gallo M.D.   On: 04/05/2014 14:20    Anti-infectives: Anti-infectives   Start     Dose/Rate Route Frequency Ordered Stop   04/05/14 2200  clindamycin (CLEOCIN) IVPB 600 mg     600 mg 100 mL/hr over 30 Minutes Intravenous 3 times per day 04/05/14 1706     04/05/14 1445  clindamycin (CLEOCIN) IVPB 600 mg     600 mg 100 mL/hr over 30 Minutes Intravenous  Once 04/05/14 1436 04/05/14 1545       Assessment/Plan Right breast cellulitis ?abscess  Unhealed wound of 4 years  Leukocytosis - 14.8 today Fever   Plan:  1. Cont. cellulitis management - IV antibiotics, pain control; WBC improving 2. I'm concerned for inflammatory breast cancer given peau de orange. Will need to control  the cellulitis and hopefully discharge for further imaging at the breast center including mammogram and ultrasound for definitive management and additional workup including possibly biopsy.  3. Diet as tolerated as no surgery is planned for today, IVF, pain control, antiemetics, antibiotics  4. Have not discussed the possibility of malignancy with the patient as of yet.  5. Pending "focal" Breast Ultrasound (full breast US cant be done), Breast MRI can't be done unless mammogram has been done and can not obtain those here.     LOS: 1 day    Coralie Keens 04/06/2014, 11:59 AM Pager: [phone]

## 2014-04-06 NOTE — Progress Notes (Signed)
INITIAL NUTRITION ASSESSMENT  DOCUMENTATION CODES Per approved criteria  -Severe malnutrition in the context of acute illness or injury   INTERVENTION: -Boost bid -Regular diet, provide preferences -RD to monitor  NUTRITION DIAGNOSIS: Inadequate oral intake related to decreased appetite as evidenced by patient report..   Goal: Intake of meals and supplements to meet >90% of estimated needs.  Monitor:  Intake, labs, weight trend  Reason for Assessment: MST  58 y.o. female  Admitting Dx: Sepsis  ASSESSMENT: Patient admitted with cellulitis of right breast.  Patient reports improving appetite currently with fair intake.  Good intake prior to admit until 6 days ago.  UBW 182 lbs 2 weeks ago.  Patient has lost 8% in the past 2 weeks.    Patient meets criteria for severe malnutrition related to acute illness AEB weight loss of 8% in the past 2 weeks, and intake <50% for >/=5 days.  Height: Ht Readings from Last 1 Encounters:  04/05/14 5\' 4"  (1.626 m)    Weight: Wt Readings from Last 1 Encounters:  04/05/14 169 lb (76.658 kg)    Ideal Body Weight: 120 lbs  % Ideal Body Weight: 141  Wt Readings from Last 10 Encounters:  04/05/14 169 lb (76.658 kg)  04/05/14 169 lb 4 oz (76.771 kg)  08/04/13 178 lb (80.74 kg)  07/04/13 181 lb 4 oz (82.214 kg)    Usual Body Weight: 182 lbs  % Usual Body Weight: 92  BMI:  Body mass index is 28.99 kg/(m^2).  Estimated Nutritional Needs: Kcal: 1800-1600 Protein: 75-85 gm Fluid: 1.8L  Skin: cellulitis  Diet Order: General  EDUCATION NEEDS: -Education needs addressed   Intake/Output Summary (Last 24 hours) at 04/06/14 1408 Last data filed at 04/06/14 0628  Gross per 24 hour  Intake  952.5 ml  Output      0 ml  Net  952.5 ml     Labs:   Recent Labs Lab 04/05/14 1410 04/06/14 0540  NA 134* 136*  K 4.3 3.7  CL 97 102  CO2 22 19  BUN 17 13  CREATININE 0.94 0.68  CALCIUM 9.7 9.0  GLUCOSE 102* 96    CBG (last  3)   Recent Labs  04/05/14 1634  GLUCAP 92    Scheduled Meds: . clindamycin (CLEOCIN) IV  600 mg Intravenous 3 times per day  . escitalopram  20 mg Oral Daily  . lactose free nutrition  237 mL Oral BID BM    Continuous Infusions: . sodium chloride 75 mL/hr at 04/06/14 8546    Past Medical History  Diagnosis Date  . Arthritis   . Depression   . Fainting   . Headache   . Hypertension   . Migraines   . History of blood transfusion   . Gastric ulcer     Past Surgical History  Procedure Laterality Date  . Abdominal hysterectomy  15 years go    partial    Antonieta Iba, RD, LDN Clinical Inpatient Dietitian Pager:  [phone] Weekend and after hours pager:  [phone]

## 2014-04-06 NOTE — Progress Notes (Signed)
[facility] PROGRESS NOTE  Mary [name] LFY:[id number] DOB: March 08, 1956 DOA: 04/05/2014 PCP: [name], MD  Assessment/Plan: Cellulitis of Right Breast -Improved with IV abx. -Concerned for inflammatory breast cancer. -For [place] today.  Sepsis -Due to above. -Sepsis parameters improved.  Mild Transaminitis -Likely NASH. -Check RUQ [place].  Code Status: Full Code Family Communication: Patient only  Disposition Plan: Home when ready   Consultants:  Surgery   Antibiotics:  Clindamycin   Subjective: Feels better  Objective: Filed Vitals:   04/05/14 1820 04/05/14 2057 04/06/14 0536 04/06/14 1352  BP: 146/87 135/71 138/79 151/79  Pulse: 101 100 98 72  Temp: 99.9 F (37.7 C) 100.4 F (38 C) 98.6 F (37 C) 99.1 F (37.3 C)  TempSrc: Oral Oral Oral Oral  Resp: 18 16 18 18   Height:      Weight:      SpO2:  98% 98% 93%    Intake/Output Summary (Last 24 hours) at 04/06/14 1614 Last data filed at 04/06/14 8527  Gross per 24 hour  Intake  952.5 ml  Output      0 ml  Net  952.5 ml   Filed Weights   04/05/14 1320  Weight: 76.658 kg (169 lb)    Exam:   General:  AA Ox3  Cardiovascular: RRR  Respiratory: CTA B  Abdomen: S/NT/ND/+BS  Extremities: no C/C/E   Neurologic:  Non-focal  Data Reviewed: Basic Metabolic Panel:  Recent Labs Lab 04/05/14 1410 04/06/14 0540  NA 134* 136*  K 4.3 3.7  CL 97 102  CO2 22 19  GLUCOSE 102* 96  BUN 17 13  CREATININE 0.94 0.68  CALCIUM 9.7 9.0   Liver Function Tests:  Recent Labs Lab 04/05/14 1410 04/06/14 0540  AST 85* 85*  ALT 44* 37*  ALKPHOS 170* 161*  BILITOT 1.2 1.2  PROT 7.4 6.5  ALBUMIN 2.9* 2.5*   No results found for this basename: LIPASE, AMYLASE,  in the last 168 hours No results found for this basename: AMMONIA,  in the last 168 hours CBC:  Recent Labs Lab 04/05/14 1410 04/06/14 0540  WBC 16.7* 14.8*  NEUTROABS 13.1*  --   HGB 9.5* 8.4*  HCT 27.8* 24.0*  MCV 90.0 87.9   PLT 343 337   Cardiac Enzymes: No results found for this basename: CKTOTAL, CKMB, CKMBINDEX, TROPONINI,  in the last 168 hours BNP (last 3 results) No results found for this basename: PROBNP,  in the last 8760 hours CBG:  Recent Labs Lab 04/05/14 1634  GLUCAP 92    Recent Results (from the past 240 hour(s))  CULTURE, BLOOD (ROUTINE X 2)     Status: None   Collection Time    04/05/14  5:30 PM      Result Value Ref Range Status   Specimen Description BLOOD LEFT HAND   Final   Special Requests BOTTLES DRAWN AEROBIC AND ANAEROBIC 4CC   Final   Culture  Setup Time     Final   Value: 04/05/2014 22:25     Performed at [facility]   Culture     Final   Value:        BLOOD CULTURE RECEIVED NO GROWTH TO DATE CULTURE WILL BE HELD FOR 5 DAYS BEFORE ISSUING A FINAL NEGATIVE REPORT     Performed at [facility]   Report Status PENDING   Incomplete  CULTURE, BLOOD (ROUTINE X 2)     Status: None   Collection Time  04/05/14  5:34 PM      Result Value Ref Range Status   Specimen Description BLOOD RIGHT ANTECUBITAL   Final   Special Requests BOTTLES DRAWN AEROBIC AND ANAEROBIC 5CC   Final   Culture  Setup Time     Final   Value: 04/05/2014 22:25     Performed at [facility]   Culture     Final   Value:        BLOOD CULTURE RECEIVED NO GROWTH TO DATE CULTURE WILL BE HELD FOR 5 DAYS BEFORE ISSUING A FINAL NEGATIVE REPORT     Performed at [facility]   Report Status PENDING   Incomplete     Studies: Dg Thoracic Spine 2 View  04/05/2014   CLINICAL DATA:  Back pain  EXAM: THORACIC SPINE - 2 VIEW  COMPARISON:  CXR 07/04/2013  FINDINGS: There is no evidence of thoracic spine fracture. Alignment is normal. No other significant bone abnormalities are identified.  IMPRESSION: Negative.   Electronically Signed   By: Franchot Gallo M.D.   On: 04/05/2014 14:20    Scheduled Meds: . clindamycin (CLEOCIN) IV  600 mg Intravenous 3 times per day  . escitalopram  20  mg Oral Daily  . lactose free nutrition  237 mL Oral BID BM   Continuous Infusions: . sodium chloride 75 mL/hr at 04/06/14 0610    Principal Problem:   Sepsis Active Problems:   Hypertension   Fever, unspecified   Cellulitis   Migraine headache   Elevated liver enzymes   Protein-calorie malnutrition, severe    Time spent: 35 minutes. Greater than 50% of this time was spent in direct contact with the patient coordinating care.    [facility] Pager [phone]  If 7PM-7AM, please contact night-coverage at [email or web address], password [facility] 04/06/2014, 4:14 PM  LOS: 1 day

## 2014-04-07 ENCOUNTER — Inpatient Hospital Stay (HOSPITAL_COMMUNITY): Payer: BC Managed Care – PPO

## 2014-04-07 LAB — COMPREHENSIVE METABOLIC PANEL
ALBUMIN: 2.4 g/dL — AB (ref 3.5–5.2)
ALT: 34 U/L (ref 0–35)
AST: 74 U/L — ABNORMAL HIGH (ref 0–37)
Alkaline Phosphatase: 179 U/L — ABNORMAL HIGH (ref 39–117)
BILIRUBIN TOTAL: 0.7 mg/dL (ref 0.3–1.2)
BUN: 9 mg/dL (ref 6–23)
CALCIUM: 9.3 mg/dL (ref 8.4–10.5)
CHLORIDE: 102 meq/L (ref 96–112)
CO2: 22 meq/L (ref 19–32)
CREATININE: 0.67 mg/dL (ref 0.50–1.10)
GFR calc Af Amer: >90 mL/min (ref >90)
Glucose, Bld: 103 mg/dL — ABNORMAL HIGH (ref 70–99)
Potassium: 3.6 mEq/L — ABNORMAL LOW (ref 3.7–5.3)
Sodium: 137 mEq/L (ref 137–147)
Total Protein: 6.7 g/dL (ref 6.0–8.3)

## 2014-04-07 LAB — CBC
HCT: 23.6 % — ABNORMAL LOW (ref 36.0–46.0)
Hemoglobin: 7.8 g/dL — ABNORMAL LOW (ref 12.0–15.0)
MCH: 29.7 pg (ref 26.0–34.0)
MCHC: 33.1 g/dL (ref 30.0–36.0)
MCV: 89.7 fL (ref 78.0–100.0)
Platelets: 398 10*3/uL (ref 150–400)
RBC: 2.63 MIL/uL — AB (ref 3.87–5.11)
RDW: 15.3 % (ref 11.5–15.5)
WBC: 13.1 10*3/uL — AB (ref 4.0–10.5)

## 2014-04-07 MED ORDER — VITAMINS A & D EX OINT
TOPICAL_OINTMENT | CUTANEOUS | Status: AC
  Administered 2014-04-07: 09:00:00
  Filled 2014-04-07: qty 5

## 2014-04-07 MED ORDER — IOHEXOL 300 MG/ML  SOLN
25.0000 mL | INTRAMUSCULAR | Status: AC
  Administered 2014-04-08: 25 mL via ORAL
  Administered 2014-04-08: 50 mL via ORAL

## 2014-04-07 NOTE — Progress Notes (Signed)
[facility] PROGRESS NOTE  Emaly Boschert EXN:[id number] DOB: [birth year] DOA: 04/05/2014 PCP: [name], MD  Assessment/Plan: Cellulitis of Right Breast -Continue improvement with IV abx. -Concern for cancer given findings of [place]. -Will need biopsy early next week and oncology consultation following results.  Right Breast Mass -Will discuss with oncology to see what steps can be taken while she is in the hospital.  Sepsis -Due to above. -Sepsis parameters resolved.  Mild Transaminitis -RUQ [place]: 1. There is an abnormal appearance of the liver worrisome for  multiple masses likely metastatic foci. There is intrahepatic ductal  dilation. Contrast-enhanced CT scanning of the abdomen and pelvis is  recommended.  2. No definite gallstones are present but there may be echogenic  bile or sludge. There is no sonographic evidence of acute  Cholecystitis.  -Likely evidence of metastatic breast cancer.  Code Status: Full Code Family Communication: Patient only  Disposition Plan: Home when ready   Consultants:  Surgery   Antibiotics:  Clindamycin   Subjective: Feels better  Objective: Filed Vitals:   04/06/14 1352 04/06/14 2200 04/07/14 0519 04/07/14 1458  BP: 151/79 134/80 130/73 149/85  Pulse: 72 90 87 96  Temp: 99.1 F (37.3 C) 98.6 F (37 C) 98.7 F (37.1 C) 97.4 F (36.3 C)  TempSrc: Oral Oral Oral Oral  Resp: 18 18 18 18   Height:      Weight:      SpO2: 93% 96% 98% 99%   No intake or output data in the 24 hours ending 04/07/14 1701 Filed Weights   04/05/14 1320  Weight: 76.658 kg (169 lb)    Exam:   General:  AA Ox3  Cardiovascular: RRR  Respiratory: CTA B  Abdomen: S/NT/ND/+BS  Extremities: no C/C/E   Neurologic:  Non-focal  Data Reviewed: Basic Metabolic Panel:  Recent Labs Lab 04/05/14 1410 04/06/14 0540 04/07/14 0630  NA 134* 136* 137  K 4.3 3.7 3.6*  CL 97 102 102  CO2 22 19 22   GLUCOSE 102* 96 103*  BUN 17 13 9     CREATININE 0.94 0.68 0.67  CALCIUM 9.7 9.0 9.3   Liver Function Tests:  Recent Labs Lab 04/05/14 1410 04/06/14 0540 04/07/14 0630  AST 85* 85* 74*  ALT 44* 37* 34  ALKPHOS 170* 161* 179*  BILITOT 1.2 1.2 0.7  PROT 7.4 6.5 6.7  ALBUMIN 2.9* 2.5* 2.4*   No results found for this basename: LIPASE, AMYLASE,  in the last 168 hours No results found for this basename: AMMONIA,  in the last 168 hours CBC:  Recent Labs Lab 04/05/14 1410 04/06/14 0540 04/07/14 0630  WBC 16.7* 14.8* 13.1*  NEUTROABS 13.1*  --   --   HGB 9.5* 8.4* 7.8*  HCT 27.8* 24.0* 23.6*  MCV 90.0 87.9 89.7  PLT 343 337 398   Cardiac Enzymes: No results found for this basename: CKTOTAL, CKMB, CKMBINDEX, TROPONINI,  in the last 168 hours BNP (last 3 results) No results found for this basename: PROBNP,  in the last 8760 hours CBG:  Recent Labs Lab 04/05/14 1634  GLUCAP 92    Recent Results (from the past 240 hour(s))  URINE CULTURE     Status: None   Collection Time    04/05/14  3:39 PM      Result Value Ref Range Status   Specimen Description URINE, CLEAN CATCH   Final   Special Requests NONE   Final   Culture  Setup Time     Final  Value: 04/05/2014 22:59     Performed at [place]     Final   Value: >=100,000 COLONIES/ML     Performed at [facility]   Culture     Final   Value: Multiple bacterial morphotypes present, none predominant. Suggest appropriate recollection if clinically indicated.     Performed at [facility]   Report Status 04/06/2014 FINAL   Final  CULTURE, BLOOD (ROUTINE X 2)     Status: None   Collection Time    04/05/14  5:30 PM      Result Value Ref Range Status   Specimen Description BLOOD LEFT HAND   Final   Special Requests BOTTLES DRAWN AEROBIC AND ANAEROBIC 4CC   Final   Culture  Setup Time     Final   Value: 04/05/2014 22:25     Performed at [facility]   Culture     Final   Value:        BLOOD CULTURE  RECEIVED NO GROWTH TO DATE CULTURE WILL BE HELD FOR 5 DAYS BEFORE ISSUING A FINAL NEGATIVE REPORT     Performed at [facility]   Report Status PENDING   Incomplete  CULTURE, BLOOD (ROUTINE X 2)     Status: None   Collection Time    04/05/14  5:34 PM      Result Value Ref Range Status   Specimen Description BLOOD RIGHT ANTECUBITAL   Final   Special Requests BOTTLES DRAWN AEROBIC AND ANAEROBIC 5CC   Final   Culture  Setup Time     Final   Value: 04/05/2014 22:25     Performed at [facility]   Culture     Final   Value:        BLOOD CULTURE RECEIVED NO GROWTH TO DATE CULTURE WILL BE HELD FOR 5 DAYS BEFORE ISSUING A FINAL NEGATIVE REPORT     Performed at [facility]   Report Status PENDING   Incomplete     Studies: [place] Chest  04/06/2014   CLINICAL DATA:  Patient admitted for right breast cellulitis. Per the admitting PA, the patient has peau d'orange of the right breast and erythema. The PA states that there is a palpable abnormality at 11 o'clock, 14 cm from the nipple.  EXAM: ULTRASOUND OF THE RIGHT BREAST  COMPARISON:  None  FINDINGS: Physical exam could not be performed since the ultrasound was done off site due to the patient being admitted.  Targeted ultrasound at 11 o'clock, 14 cm from the nipple demonstrates a large mass with internal vascularity. This measures at least 7 x 6.3 x 5.1 cm. Due to the large size, exact measurements are difficult. Foci of increased echogenicity are noted within the mass, suggestive of calcifications.  IMPRESSION: Suspicious right breast mass.  RECOMMENDATION: A bilateral diagnostic ultrasound and possibly repeat right breast ultrasound is recommended. Following this, the patient will need an ultrasound-guided core needle biopsy of the right breast mass imaged today at 11 o'clock. Further recommendations will be made following complete workup.  Results and recommendations were discussed with PA Megan Dort at 4:20 p.m. on 04/06/2014.   BI-RADS CATEGORY  0: Incomplete. Need additional imaging evaluation and/or prior mammograms for comparison.   Electronically Signed   By: Donavan Burnet M.D.   On: 04/06/2014 16:27   US Abdomen Limited Ruq  04/07/2014   CLINICAL DATA:  Elevated hepatic function studies.  EXAM: US ABDOMEN LIMITED -  RIGHT UPPER QUADRANT  COMPARISON:  None.  FINDINGS: Gallbladder:  The gallbladder is adequately distended. No calcified stones are evident. . There is no wall thickening or pericholecystic fluid or positive sonographic Murphy's sign.  Common bile duct:  Diameter: 4 mm  Liver:  There are multiple hypoechoic masses with central areas of increased echogenicity throughout the liver. The largest demonstrated is 5.3 x 3.8 x 5.1 cm and lies in the left lobe. There is mild intrahepatic ductal dilation.  IMPRESSION: 1. There is an abnormal appearance of the liver worrisome for multiple masses likely metastatic foci. There is intrahepatic ductal dilation. Contrast-enhanced CT scanning of the abdomen and pelvis is recommended. 2. No definite gallstones are present but there may be echogenic bile or sludge. There is no sonographic evidence of acute cholecystitis.   Electronically Signed   By: David  Martinique   On: 04/07/2014 10:27    Scheduled Meds: . clindamycin (CLEOCIN) IV  600 mg Intravenous 3 times per day  . escitalopram  20 mg Oral Daily  . lactose free nutrition  237 mL Oral BID BM   Continuous Infusions: . sodium chloride 75 mL/hr at 04/07/14 1049    Principal Problem:   Sepsis Active Problems:   Hypertension   Fever, unspecified   Cellulitis   Migraine headache   Elevated liver enzymes   Protein-calorie malnutrition, severe    Time spent: 35 minutes. Greater than 50% of this time was spent in direct contact with the patient coordinating care.    [facility] Pager [phone]  If 7PM-7AM, please contact night-coverage at [email or web address], password  [facility] 04/07/2014, 5:01 PM  LOS: 2 days

## 2014-04-07 NOTE — Progress Notes (Signed)
Subjective: No complaints.  She reports that she noted a mass in the UOQ of the right breast 4 years ago that has been progressively getting bigger.  She did not have it evaluated.  Objective: Vital signs in last 24 hours: Temp:  [98.6 F (37 C)-99.1 F (37.3 C)] 98.7 F (37.1 C) (04/11 0519) Pulse Rate:  [72-90] 87 (04/11 0519) Resp:  [18] 18 (04/11 0519) BP: (130-151)/(73-80) 130/73 mmHg (04/11 0519) SpO2:  [93 %-98 %] 98 % (04/11 0519) Last BM Date: 04/04/14  Intake/Output from previous day:   Intake/Output this shift:    PE: General- In NAD Right breast-erythema present that appears improved from picture on 4/10; large, firm, fungating mass in UOQ.  Lab Results:   Recent Labs  04/06/14 0540 04/07/14 0630  WBC 14.8* 13.1*  HGB 8.4* 7.8*  HCT 24.0* 23.6*  PLT 337 398   BMET  Recent Labs  04/06/14 0540 04/07/14 0630  NA 136* 137  K 3.7 3.6*  CL 102 102  CO2 19 22  GLUCOSE 96 103*  BUN 13 9  CREATININE 0.68 0.67  CALCIUM 9.0 9.3   PT/INR No results found for this basename: LABPROT, INR,  in the last 72 hours Comprehensive Metabolic Panel:    Component Value Date/Time   NA 137 04/07/2014 0630   NA 136* 04/06/2014 0540   K 3.6* 04/07/2014 0630   K 3.7 04/06/2014 0540   CL 102 04/07/2014 0630   CL 102 04/06/2014 0540   CO2 22 04/07/2014 0630   CO2 19 04/06/2014 0540   BUN 9 04/07/2014 0630   BUN 13 04/06/2014 0540   CREATININE 0.67 04/07/2014 0630   CREATININE 0.68 04/06/2014 0540   GLUCOSE 103* 04/07/2014 0630   GLUCOSE 96 04/06/2014 0540   CALCIUM 9.3 04/07/2014 0630   CALCIUM 9.0 04/06/2014 0540   AST 74* 04/07/2014 0630   AST 85* 04/06/2014 0540   ALT 34 04/07/2014 0630   ALT 37* 04/06/2014 0540   ALKPHOS 179* 04/07/2014 0630   ALKPHOS 161* 04/06/2014 0540   BILITOT 0.7 04/07/2014 0630   BILITOT 1.2 04/06/2014 0540   PROT 6.7 04/07/2014 0630   PROT 6.5 04/06/2014 0540   ALBUMIN 2.4* 04/07/2014 0630   ALBUMIN 2.5* 04/06/2014 0540     Studies/Results: Dg  Thoracic Spine 2 View  04/05/2014   CLINICAL DATA:  Back pain  EXAM: THORACIC SPINE - 2 VIEW  COMPARISON:  CXR 07/04/2013  FINDINGS: There is no evidence of thoracic spine fracture. Alignment is normal. No other significant bone abnormalities are identified.  IMPRESSION: Negative.   Electronically Signed   By: Franchot Gallo M.D.   On: 04/05/2014 14:20   [place] Chest  04/06/2014   CLINICAL DATA:  Patient admitted for right breast cellulitis. Per the admitting PA, the patient has peau d'orange of the right breast and erythema. The PA states that there is a palpable abnormality at 11 o'clock, 14 cm from the nipple.  EXAM: ULTRASOUND OF THE RIGHT BREAST  COMPARISON:  None  FINDINGS: Physical exam could not be performed since the ultrasound was done off site due to the patient being admitted.  Targeted ultrasound at 11 o'clock, 14 cm from the nipple demonstrates a large mass with internal vascularity. This measures at least 7 x 6.3 x 5.1 cm. Due to the large size, exact measurements are difficult. Foci of increased echogenicity are noted within the mass, suggestive of calcifications.  IMPRESSION: Suspicious right breast mass.  RECOMMENDATION: A bilateral diagnostic ultrasound  and possibly repeat right breast ultrasound is recommended. Following this, the patient will need an ultrasound-guided core needle biopsy of the right breast mass imaged today at 11 o'clock. Further recommendations will be made following complete workup.  Results and recommendations were discussed with PA Megan Dort at 4:20 p.m. on 04/06/2014.  BI-RADS CATEGORY  0: Incomplete. Need additional imaging evaluation and/or prior mammograms for comparison.   Electronically Signed   By: Donavan Burnet M.D.   On: 04/06/2014 16:27    Anti-infectives: Anti-infectives   Start     Dose/Rate Route Frequency Ordered Stop   04/05/14 2200  clindamycin (CLEOCIN) IVPB 600 mg     600 mg 100 mL/hr over 30 Minutes Intravenous 3 times per day 04/05/14 1706      04/05/14 1445  clindamycin (CLEOCIN) IVPB 600 mg     600 mg 100 mL/hr over 30 Minutes Intravenous  Once 04/05/14 1436 04/05/14 1545      Assessment  Largest fungating right breast mass with surrounding cellulitis that is improving with abxs.-clinically, this is consistent with an inflammatory breast cancer.  I had a long discussion with her and her husband regarding the likely diagnosis of breast cancer.    LOS: 2 days   Rec:  Large core needle biopsy of mass early next week with a rush on the pathology.  If positive, would then obtain an urgent medical oncology consult.  Will see again on Monday.   Rhunette Croft Erric Machnik 04/07/2014

## 2014-04-08 ENCOUNTER — Encounter (HOSPITAL_COMMUNITY): Payer: Self-pay | Admitting: Radiology

## 2014-04-08 ENCOUNTER — Inpatient Hospital Stay (HOSPITAL_COMMUNITY): Payer: BC Managed Care – PPO

## 2014-04-08 MED ORDER — IOHEXOL 300 MG/ML  SOLN
100.0000 mL | Freq: Once | INTRAMUSCULAR | Status: AC | PRN
  Administered 2014-04-08: 100 mL via INTRAVENOUS

## 2014-04-08 NOTE — Progress Notes (Signed)
[facility] PROGRESS NOTE  Mary [name] ZOX:[id number] DOB: Dec 09, 1956 DOA: 04/05/2014 PCP: [name], MD  Assessment/Plan: Cellulitis of Right Breast -Continue improvement with IV abx. -Will need biopsy early next week and oncology consultation following results.  Right Breast Mass -Discussed via phone with onc, Dr. Alen Blew: will get staging CT scans and see if IR can perform biopsy early next week.  Sepsis -Due to above. -Sepsis parameters resolved.  Mild Transaminitis -RUQ [place]: 1. There is an abnormal appearance of the liver worrisome for  multiple masses likely metastatic foci. There is intrahepatic ductal  dilation. Contrast-enhanced CT scanning of the abdomen and pelvis is  recommended.  2. No definite gallstones are present but there may be echogenic  bile or sludge. There is no sonographic evidence of acute  Cholecystitis.  -Likely evidence of metastatic breast cancer.  Code Status: Full Code Family Communication: Patient only  Disposition Plan: Home when ready   Consultants:  Surgery   Antibiotics:  Clindamycin   Subjective: Feels better  Objective: Filed Vitals:   04/07/14 1458 04/07/14 2133 04/08/14 0611 04/08/14 1500  BP: 149/85 163/96 156/86 144/88  Pulse: 96 89 89 88  Temp: 97.4 F (36.3 C) 98.1 F (36.7 C) 98.1 F (36.7 C) 98.5 F (36.9 C)  TempSrc: Oral Oral Oral Oral  Resp: 18 18 18 18   Height:      Weight:      SpO2: 99% 95% 92% 98%    Intake/Output Summary (Last 24 hours) at 04/08/14 1634 Last data filed at 04/08/14 0700  Gross per 24 hour  Intake 1403.75 ml  Output      0 ml  Net 1403.75 ml   Filed Weights   04/05/14 1320  Weight: 76.658 kg (169 lb)    Exam:   General:  AA Ox3  Cardiovascular: RRR  Respiratory: CTA B  Abdomen: S/NT/ND/+BS  Extremities: no C/C/E   Neurologic:  Non-focal  Data Reviewed: Basic Metabolic Panel:  Recent Labs Lab 04/05/14 1410 04/06/14 0540 04/07/14 0630  NA 134*  136* 137  K 4.3 3.7 3.6*  CL 97 102 102  CO2 22 19 22   GLUCOSE 102* 96 103*  BUN 17 13 9   CREATININE 0.94 0.68 0.67  CALCIUM 9.7 9.0 9.3   Liver Function Tests:  Recent Labs Lab 04/05/14 1410 04/06/14 0540 04/07/14 0630  AST 85* 85* 74*  ALT 44* 37* 34  ALKPHOS 170* 161* 179*  BILITOT 1.2 1.2 0.7  PROT 7.4 6.5 6.7  ALBUMIN 2.9* 2.5* 2.4*   No results found for this basename: LIPASE, AMYLASE,  in the last 168 hours No results found for this basename: AMMONIA,  in the last 168 hours CBC:  Recent Labs Lab 04/05/14 1410 04/06/14 0540 04/07/14 0630  WBC 16.7* 14.8* 13.1*  NEUTROABS 13.1*  --   --   HGB 9.5* 8.4* 7.8*  HCT 27.8* 24.0* 23.6*  MCV 90.0 87.9 89.7  PLT 343 337 398   Cardiac Enzymes: No results found for this basename: CKTOTAL, CKMB, CKMBINDEX, TROPONINI,  in the last 168 hours BNP (last 3 results) No results found for this basename: PROBNP,  in the last 8760 hours CBG:  Recent Labs Lab 04/05/14 1634  GLUCAP 92    Recent Results (from the past 240 hour(s))  URINE CULTURE     Status: None   Collection Time    04/05/14  3:39 PM      Result Value Ref Range Status   Specimen Description URINE, CLEAN  CATCH   Final   Special Requests NONE   Final   Culture  Setup Time     Final   Value: 04/05/2014 22:59     Performed at [facility] Count     Final   Value: >=100,000 COLONIES/ML     Performed at [facility]   Culture     Final   Value: Multiple bacterial morphotypes present, none predominant. Suggest appropriate recollection if clinically indicated.     Performed at [facility]   Report Status 04/06/2014 FINAL   Final  CULTURE, BLOOD (ROUTINE X 2)     Status: None   Collection Time    04/05/14  5:30 PM      Result Value Ref Range Status   Specimen Description BLOOD LEFT HAND   Final   Special Requests BOTTLES DRAWN AEROBIC AND ANAEROBIC 4CC   Final   Culture  Setup Time     Final   Value: 04/05/2014 22:25      Performed at [facility]   Culture     Final   Value:        BLOOD CULTURE RECEIVED NO GROWTH TO DATE CULTURE WILL BE HELD FOR 5 DAYS BEFORE ISSUING A FINAL NEGATIVE REPORT     Performed at [facility]   Report Status PENDING   Incomplete  CULTURE, BLOOD (ROUTINE X 2)     Status: None   Collection Time    04/05/14  5:34 PM      Result Value Ref Range Status   Specimen Description BLOOD RIGHT ANTECUBITAL   Final   Special Requests BOTTLES DRAWN AEROBIC AND ANAEROBIC 5CC   Final   Culture  Setup Time     Final   Value: 04/05/2014 22:25     Performed at [facility]   Culture     Final   Value:        BLOOD CULTURE RECEIVED NO GROWTH TO DATE CULTURE WILL BE HELD FOR 5 DAYS BEFORE ISSUING A FINAL NEGATIVE REPORT     Performed at [facility]   Report Status PENDING   Incomplete     Studies: Ct Chest W Contrast  04/08/2014   CLINICAL DATA:  Recent diagnosis of breast cancer, initial staging.  EXAM: CT CHEST, ABDOMEN, AND PELVIS WITH CONTRAST  TECHNIQUE: Multidetector CT imaging of the chest, abdomen and pelvis was performed following the standard protocol during bolus administration of intravenous contrast.  CONTRAST:  1 OMNIPAQUE IOHEXOL 300 MG/ML SOLN, 133mL OMNIPAQUE IOHEXOL 300 MG/ML SOLN  COMPARISON:  None.  FINDINGS: CT CHEST FINDINGS  Mild asymmetric fullness of the left thyroid lobe may suggest underlying nodule, with punctate calcification identified image 10. Great vessels are normal in caliber. Heart size is normal. Small, right greater than left, pleural effusions are identified. Associated compressive atelectasis is noted. No pericardial effusion.  There is a centrally necrotic right upper outer quadrant breast mass measuring 6.2 x 6.0 x 7.2 cm image 27. This is immediately contiguous with the overlying skin. No discernible fat plane is identified between the mass and the underlying pectoralis minor muscle, for example image 26. Associated right  breast skin and trabecular thickening are identified. Centrally hypodense likely necrotic dominant right axillary lymphadenopathy is present, largest representative node measuring 2.6 cm in short axis diameter image 19. Retropectoral lymphadenopathy is also identified, representative 1.4 cm level 3 retropectoral node identified image 12. Several left axillary nodes are identified  with prominent cortices subjectively, but no adenopathy. AP window lymphadenopathy is identified, largest 1.6 cm image 20. Pretracheal lymphadenopathy measures 1.3 cm image 20. Right hilar lymphadenopathy measures 1.1 cm image 26. Epicardial lymphadenopathy measures 0.8 cm image 42.  Innumerable oval and round pulmonary masses are identified throughout both lungs, largest right upper lobe abutting the major fissure measuring 1.3 cm image 21.  Bone windows demonstrate multiple lytic lesions in the thoracic spine for example image 40, and sternum image 21. Multiple lytic rib lesions are also noted, including expansile left ninth lateral rib lesion with surrounding soft tissue component and pathologic fracture image 41.  CT ABDOMEN AND PELVIS FINDINGS  There are innumerable irregular hypodense hepatic masses with rim enhancement including a dominant lateral segment left hepatic lobe mass measuring at least 6.3 x 5.2 cm image 59. Several of these hepatic lesions are associated with capsular retraction and trace perihepatic ascites is noted, for example image 55. Porta hepatis node versus exophytic hepatic mass measures 1.9 cm image 60. Peritoneal nodules are also identified, for example right upper quadrant measuring 0.8 cm image 69.  Multiple hypodense splenic lesions are identified, largest 1.1 cm image 59. Statistically these are most likely cysts or lymphangiomas, less likely hemangiomas, although metastatic disease is within the differential diagnosis.  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 No  hydroureteronephrosis. Adrenal glands, pancreas, and collapse gallbladder are unremarkable. Ascites tracks to the pelvis, overall small in volume. No bowel wall thickening or focal segmental dilatation is identified. The appendix is normal. Bladder is normal.  In the left adnexa, there is nodularity measuring 2.7 cm image 101. The patient is status post hysterectomy. Right ovary is likely visualized image 96. The nodularity in the left adnexa may represent the left ovary although metastatic disease is not excluded as ovarian remnant tissue is thought to be more likely located along the left pelvic sidewall image 97. Small fat containing left inguinal hernia.  Mild disc degenerative change noted. L4 and L5 and L5-S1 osteoarthritic facet change noted. Lytic sacral lesion identified for example image 91. There are also ill-defined lytic lesions elsewhere in the bony pelvis for example sagittal reformatted image 108 representative lesion. No compression deformity.  IMPRESSION: Dominant right upper outer quadrant breast mass with findings overall suggestive of stage IV metastatic disease involving lungs, liver, and bone as above. Multi station lymphadenopathy is also described as above. If the target for biopsy is needed, the dominant right upper outer quadrant breast mass and liver masses would likely be most amenable to percutaneous sampling.  Pathologic left lateral ninth rib fracture. This is associated with the lytic presumably metastatic lesion with soft tissue component as described above.  Left adnexal mass versus remnant ovary status post hysterectomy. This is amenable to further characterization and follow-up at presumed future restaging exams as clinically needed.   Electronically Signed   By: Conchita Paris M.D.   On: 04/08/2014 11:37   Ct Abdomen Pelvis W Contrast  04/08/2014   CLINICAL DATA:  Recent diagnosis of breast cancer, initial staging.  EXAM: CT CHEST, ABDOMEN, AND PELVIS WITH CONTRAST   TECHNIQUE: Multidetector CT imaging of the chest, abdomen and pelvis was performed following the standard protocol during bolus administration of intravenous contrast.  CONTRAST:  1 OMNIPAQUE IOHEXOL 300 MG/ML SOLN, 183mL OMNIPAQUE IOHEXOL 300 MG/ML SOLN  COMPARISON:  None.  FINDINGS: CT CHEST FINDINGS  Mild asymmetric fullness of the left thyroid lobe may suggest underlying nodule, with punctate calcification  identified image 10. Great vessels are normal in caliber. Heart size is normal. Small, right greater than left, pleural effusions are identified. Associated compressive atelectasis is noted. No pericardial effusion.  There is a centrally necrotic right upper outer quadrant breast mass measuring 6.2 x 6.0 x 7.2 cm image 27. This is immediately contiguous with the overlying skin. No discernible fat plane is identified between the mass and the underlying pectoralis minor muscle, for example image 26. Associated right breast skin and trabecular thickening are identified. Centrally hypodense likely necrotic dominant right axillary lymphadenopathy is present, largest representative node measuring 2.6 cm in short axis diameter image 19. Retropectoral lymphadenopathy is also identified, representative 1.4 cm level 3 retropectoral node identified image 12. Several left axillary nodes are identified with prominent cortices subjectively, but no adenopathy. AP window lymphadenopathy is identified, largest 1.6 cm image 20. Pretracheal lymphadenopathy measures 1.3 cm image 20. Right hilar lymphadenopathy measures 1.1 cm image 26. Epicardial lymphadenopathy measures 0.8 cm image 42.  Innumerable oval and round pulmonary masses are identified throughout both lungs, largest right upper lobe abutting the major fissure measuring 1.3 cm image 21.  Bone windows demonstrate multiple lytic lesions in the thoracic spine for example image 40, and sternum image 21. Multiple lytic rib lesions are also noted, including expansile left  ninth lateral rib lesion with surrounding soft tissue component and pathologic fracture image 41.  CT ABDOMEN AND PELVIS FINDINGS  There are innumerable irregular hypodense hepatic masses with rim enhancement including a dominant lateral segment left hepatic lobe mass measuring at least 6.3 x 5.2 cm image 59. Several of these hepatic lesions are associated with capsular retraction and trace perihepatic ascites is noted, for example image 55. Porta hepatis node versus exophytic hepatic mass measures 1.9 cm image 60. Peritoneal nodules are also identified, for example right upper quadrant measuring 0.8 cm image 69.  Multiple hypodense splenic lesions are identified, largest 1.1 cm image 59. Statistically these are most likely cysts or lymphangiomas, less likely hemangiomas, although metastatic disease is within the differential diagnosis.  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 No hydroureteronephrosis. Adrenal glands, pancreas, and collapse gallbladder are unremarkable. Ascites tracks to the pelvis, overall small in volume. No bowel wall thickening or focal segmental dilatation is identified. The appendix is normal. Bladder is normal.  In the left adnexa, there is nodularity measuring 2.7 cm image 101. The patient is status post hysterectomy. Right ovary is likely visualized image 96. The nodularity in the left adnexa may represent the left ovary although metastatic disease is not excluded as ovarian remnant tissue is thought to be more likely located along the left pelvic sidewall image 97. Small fat containing left inguinal hernia.  Mild disc degenerative change noted. L4 and L5 and L5-S1 osteoarthritic facet change noted. Lytic sacral lesion identified for example image 91. There are also ill-defined lytic lesions elsewhere in the bony pelvis for example sagittal reformatted image 108 representative lesion. No compression deformity.  IMPRESSION: Dominant right upper outer  quadrant breast mass with findings overall suggestive of stage IV metastatic disease involving lungs, liver, and bone as above. Multi station lymphadenopathy is also described as above. If the target for biopsy is needed, the dominant right upper outer quadrant breast mass and liver masses would likely be most amenable to percutaneous sampling.  Pathologic left lateral ninth rib fracture. This is associated with the lytic presumably metastatic lesion with soft tissue component as described above.  Left adnexal mass versus  remnant ovary status post hysterectomy. This is amenable to further characterization and follow-up at presumed future restaging exams as clinically needed.   Electronically Signed   By: Conchita Paris M.D.   On: 04/08/2014 11:37   US Abdomen Limited Ruq  04/07/2014   CLINICAL DATA:  Elevated hepatic function studies.  EXAM: US ABDOMEN LIMITED - RIGHT UPPER QUADRANT  COMPARISON:  None.  FINDINGS: Gallbladder:  The gallbladder is adequately distended. No calcified stones are evident. . There is no wall thickening or pericholecystic fluid or positive sonographic Murphy's sign.  Common bile duct:  Diameter: 4 mm  Liver:  There are multiple hypoechoic masses with central areas of increased echogenicity throughout the liver. The largest demonstrated is 5.3 x 3.8 x 5.1 cm and lies in the left lobe. There is mild intrahepatic ductal dilation.  IMPRESSION: 1. There is an abnormal appearance of the liver worrisome for multiple masses likely metastatic foci. There is intrahepatic ductal dilation. Contrast-enhanced CT scanning of the abdomen and pelvis is recommended. 2. No definite gallstones are present but there may be echogenic bile or sludge. There is no sonographic evidence of acute cholecystitis.   Electronically Signed   By: David  Martinique   On: 04/07/2014 10:27    Scheduled Meds: . clindamycin (CLEOCIN) IV  600 mg Intravenous 3 times per day  . escitalopram  20 mg Oral Daily  . lactose free  nutrition  237 mL Oral BID BM   Continuous Infusions: . sodium chloride 75 mL/hr at 04/08/14 0111    Principal Problem:   Sepsis Active Problems:   Hypertension   Fever, unspecified   Cellulitis   Migraine headache   Elevated liver enzymes   Protein-calorie malnutrition, severe    Time spent: 35 minutes. Greater than 50% of this time was spent in direct contact with the patient coordinating care.    [facility] Pager [phone]  If 7PM-7AM, please contact night-coverage at [email or web address], password [facility] 04/08/2014, 4:34 PM  LOS: 3 days

## 2014-04-08 NOTE — Progress Notes (Addendum)
   CARE MANAGEMENT NOTE 04/08/2014  Patient:  Mary [name], Mary [name]   Account Number:  [id number]  Date Initiated:  04/08/2014  Documentation initiated by:  [facility]  Subjective/Objective Assessment:   cellulitis     Action/Plan:   lives at home with husband   Anticipated DC Date:     Anticipated DC Plan:  [place]  CM consult      Choice offered to / List presented to:             Status of service:  In process, will continue to follow Medicare Important Message given?   (If response is "NO", the following Medicare IM given date fields will be blank) Date Medicare IM given:   Date Additional Medicare IM given:    Discharge Disposition:    Per UR Regulation:    If discussed at Long Length of Stay Meetings, dates discussed:    Comments:  04/08/2014 1645 NCM spoke to pt and husband has not returned with insurance info. Gave information on FMLA and filing for SS disability. States she is currently unemployed but husband works full-time. Jonnie Finner RN CCM Case Mgmt phone [phone]  04/08/2014 0930 NCM spoke to pt and states she completed paperwork to have Is[employer] reinstated. States she received letter but it was at home. NCM explained importance of having information on file to send adequate info to insurance provider regarding hospitalization. States her husband will bring info when he returns. Jonnie Finner RN CCM Case Mgmt phone [phone]

## 2014-04-09 ENCOUNTER — Inpatient Hospital Stay (HOSPITAL_COMMUNITY): Payer: BC Managed Care – PPO

## 2014-04-09 ENCOUNTER — Encounter (HOSPITAL_COMMUNITY): Payer: Self-pay | Admitting: Radiology

## 2014-04-09 ENCOUNTER — Telehealth: Payer: Self-pay | Admitting: *Deleted

## 2014-04-09 DIAGNOSIS — A419 Sepsis, unspecified organism: Principal | ICD-10-CM

## 2014-04-09 DIAGNOSIS — C50419 Malignant neoplasm of upper-outer quadrant of unspecified female breast: Secondary | ICD-10-CM

## 2014-04-09 DIAGNOSIS — C7951 Secondary malignant neoplasm of bone: Secondary | ICD-10-CM

## 2014-04-09 DIAGNOSIS — C7800 Secondary malignant neoplasm of unspecified lung: Secondary | ICD-10-CM

## 2014-04-09 DIAGNOSIS — R188 Other ascites: Secondary | ICD-10-CM

## 2014-04-09 DIAGNOSIS — C787 Secondary malignant neoplasm of liver and intrahepatic bile duct: Secondary | ICD-10-CM

## 2014-04-09 DIAGNOSIS — N61 Mastitis without abscess: Secondary | ICD-10-CM

## 2014-04-09 DIAGNOSIS — C7952 Secondary malignant neoplasm of bone marrow: Secondary | ICD-10-CM

## 2014-04-09 LAB — CBC
HCT: 24.7 % — ABNORMAL LOW (ref 36.0–46.0)
Hemoglobin: 8.2 g/dL — ABNORMAL LOW (ref 12.0–15.0)
MCH: 29.7 pg (ref 26.0–34.0)
MCHC: 33.2 g/dL (ref 30.0–36.0)
MCV: 89.5 fL (ref 78.0–100.0)
PLATELETS: 541 10*3/uL — AB (ref 150–400)
RBC: 2.76 MIL/uL — AB (ref 3.87–5.11)
RDW: 15.2 % (ref 11.5–15.5)
WBC: 10.6 10*3/uL — ABNORMAL HIGH (ref 4.0–10.5)

## 2014-04-09 LAB — BASIC METABOLIC PANEL
BUN: 5 mg/dL — ABNORMAL LOW (ref 6–23)
CHLORIDE: 101 meq/L (ref 96–112)
CO2: 23 meq/L (ref 19–32)
Calcium: 9.3 mg/dL (ref 8.4–10.5)
Creatinine, Ser: 0.59 mg/dL (ref 0.50–1.10)
GFR calc Af Amer: >90 mL/min (ref >90)
GFR calc non Af Amer: >90 mL/min (ref >90)
Glucose, Bld: 98 mg/dL (ref 70–99)
Potassium: 3.5 mEq/L — ABNORMAL LOW (ref 3.7–5.3)
SODIUM: 137 meq/L (ref 137–147)

## 2014-04-09 LAB — MAGNESIUM: MAGNESIUM: 1.9 mg/dL (ref 1.5–2.5)

## 2014-04-09 LAB — PROTIME-INR
INR: 1.26 (ref 0.00–1.49)
PROTHROMBIN TIME: 15.5 s — AB (ref 11.6–15.2)

## 2014-04-09 LAB — APTT: aPTT: 40 seconds — ABNORMAL HIGH (ref 24–37)

## 2014-04-09 MED ORDER — ALUM & MAG HYDROXIDE-SIMETH 200-200-20 MG/5ML PO SUSP
30.0000 mL | Freq: Four times a day (QID) | ORAL | Status: DC | PRN
  Administered 2014-04-09: 30 mL via ORAL
  Filled 2014-04-09: qty 30

## 2014-04-09 MED ORDER — MIDAZOLAM HCL 2 MG/2ML IJ SOLN
INTRAMUSCULAR | Status: AC | PRN
  Administered 2014-04-09: 1 mg via INTRAVENOUS

## 2014-04-09 MED ORDER — FENTANYL CITRATE 0.05 MG/ML IJ SOLN
INTRAMUSCULAR | Status: AC
  Filled 2014-04-09: qty 4

## 2014-04-09 MED ORDER — ZOLPIDEM TARTRATE 5 MG PO TABS
5.0000 mg | ORAL_TABLET | Freq: Once | ORAL | Status: AC
  Administered 2014-04-09: 5 mg via ORAL
  Filled 2014-04-09: qty 1

## 2014-04-09 MED ORDER — MIDAZOLAM HCL 2 MG/2ML IJ SOLN
INTRAMUSCULAR | Status: AC
  Filled 2014-04-09: qty 4

## 2014-04-09 MED ORDER — POTASSIUM CHLORIDE 10 MEQ/100ML IV SOLN
10.0000 meq | Freq: Once | INTRAVENOUS | Status: AC
  Administered 2014-04-09: 10 meq via INTRAVENOUS
  Filled 2014-04-09: qty 100

## 2014-04-09 MED ORDER — FENTANYL CITRATE 0.05 MG/ML IJ SOLN
INTRAMUSCULAR | Status: AC | PRN
  Administered 2014-04-09: 100 ug via INTRAVENOUS

## 2014-04-09 NOTE — Procedures (Signed)
Successful RT LIVER MASS 18 G CORE BX NO COMP STABLE SUSPECT MET BREAST CANCER

## 2014-04-09 NOTE — Telephone Encounter (Signed)
Mary [name] Fax: [phone] From: [facility] Date/ Time: 04/07/2014 11:28 PM Taken By: Osvaldo Human: [place]: not collected Patient: Mary [name], Mary [name] DOB: [birth year] Phone: [phone] Reason for Call: admitted into [facility] on Thursday, doctors say she has UTI and they will be doing a biopsy, sister wants Dr. Jenny Reichmann to be aware that this is going on, and that the others are not doing anything excessive. Regarding Appointment: Appt Date: Appt Time: Unknown Provider: Reason: Details:

## 2014-04-09 NOTE — Progress Notes (Signed)
Subjective: She feels better, the cellulitis is better.  She is awaiting IR and biopsy today.  Objective: Vital signs in last 24 hours: Temp:  [97.1 F (36.2 C)-98.5 F (36.9 C)] 97.1 F (36.2 C) (04/13 0500) Pulse Rate:  [82-88] 82 (04/13 0500) Resp:  [18] 18 (04/13 0500) BP: (144-150)/(88-95) 148/91 mmHg (04/13 0500) SpO2:  [95 %-98 %] 95 % (04/13 0500) Last BM Date: 04/06/14 590 PO recorded,  Currently NPO Afebrile, VSS, BP is up. K+ 3.5  WBC improving,  Ongoing anemia CT scan 04/08/14:  Dominant right upper outer quadrant breast mass with findings overall suggestive of stage IV metastatic disease involving lungs, liver, and bone as above. Multi station lymphadenopathy is also described as above. If the target for biopsy is needed, the dominant right upper outer quadrant breast mass and liver masses would likely be most amenable to percutaneous sampling. Pathologic left lateral ninth rib fracture.   Intake/Output from previous day: 04/12 0701 - 04/13 0700 In: 31 [P.O.:590] Out: 500 [Urine:500] Intake/Output this shift:    General appearance: alert, cooperative and no distress Breasts: The cellulitis is much better, not much drainage now.  This is comparable to the the picture from Dr. Jennell Corner consult note.  Lab Results:   Recent Labs  04/07/14 0630 04/09/14 0450  WBC 13.1* 10.6*  HGB 7.8* 8.2*  HCT 23.6* 24.7*  PLT 398 541*    BMET  Recent Labs  04/07/14 0630 04/09/14 0450  NA 137 137  K 3.6* 3.5*  CL 102 101  CO2 22 23  GLUCOSE 103* 98  BUN 9 5*  CREATININE 0.67 0.59  CALCIUM 9.3 9.3   PT/INR  Recent Labs  04/09/14 0450  LABPROT 15.5*  INR 1.26     Recent Labs Lab 04/05/14 1410 04/06/14 0540 04/07/14 0630  AST 85* 85* 74*  ALT 44* 37* 34  ALKPHOS 170* 161* 179*  BILITOT 1.2 1.2 0.7  PROT 7.4 6.5 6.7  ALBUMIN 2.9* 2.5* 2.4*     Lipase  No results found for this basename: lipase     Studies/Results: Ct Chest W  Contrast  04/08/2014   CLINICAL DATA:  Recent diagnosis of breast cancer, initial staging.  EXAM: CT CHEST, ABDOMEN, AND PELVIS WITH CONTRAST  TECHNIQUE: Multidetector CT imaging of the chest, abdomen and pelvis was performed following the standard protocol during bolus administration of intravenous contrast.  CONTRAST:  1 OMNIPAQUE IOHEXOL 300 MG/ML SOLN, 117mL OMNIPAQUE IOHEXOL 300 MG/ML SOLN  COMPARISON:  None.  FINDINGS: CT CHEST FINDINGS  Mild asymmetric fullness of the left thyroid lobe may suggest underlying nodule, with punctate calcification identified image 10. Great vessels are normal in caliber. Heart size is normal. Small, right greater than left, pleural effusions are identified. Associated compressive atelectasis is noted. No pericardial effusion.  There is a centrally necrotic right upper outer quadrant breast mass measuring 6.2 x 6.0 x 7.2 cm image 27. This is immediately contiguous with the overlying skin. No discernible fat plane is identified between the mass and the underlying pectoralis minor muscle, for example image 26. Associated right breast skin and trabecular thickening are identified. Centrally hypodense likely necrotic dominant right axillary lymphadenopathy is present, largest representative node measuring 2.6 cm in short axis diameter image 19. Retropectoral lymphadenopathy is also identified, representative 1.4 cm level 3 retropectoral node identified image 12. Several left axillary nodes are identified with prominent cortices subjectively, but no adenopathy. AP window lymphadenopathy is identified, largest 1.6 cm image 20. Pretracheal lymphadenopathy measures 1.3  cm image 20. Right hilar lymphadenopathy measures 1.1 cm image 26. Epicardial lymphadenopathy measures 0.8 cm image 42.  Innumerable oval and round pulmonary masses are identified throughout both lungs, largest right upper lobe abutting the major fissure measuring 1.3 cm image 21.  Bone windows demonstrate multiple lytic  lesions in the thoracic spine for example image 40, and sternum image 21. Multiple lytic rib lesions are also noted, including expansile left ninth lateral rib lesion with surrounding soft tissue component and pathologic fracture image 41.  CT ABDOMEN AND PELVIS FINDINGS  There are innumerable irregular hypodense hepatic masses with rim enhancement including a dominant lateral segment left hepatic lobe mass measuring at least 6.3 x 5.2 cm image 59. Several of these hepatic lesions are associated with capsular retraction and trace perihepatic ascites is noted, for example image 55. Porta hepatis node versus exophytic hepatic mass measures 1.9 cm image 60. Peritoneal nodules are also identified, for example right upper quadrant measuring 0.8 cm image 69.  Multiple hypodense splenic lesions are identified, largest 1.1 cm image 59. Statistically these are most likely cysts or lymphangiomas, less likely hemangiomas, although metastatic disease is within the differential diagnosis.  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 No hydroureteronephrosis. Adrenal glands, pancreas, and collapse gallbladder are unremarkable. Ascites tracks to the pelvis, overall small in volume. No bowel wall thickening or focal segmental dilatation is identified. The appendix is normal. Bladder is normal.  In the left adnexa, there is nodularity measuring 2.7 cm image 101. The patient is status post hysterectomy. Right ovary is likely visualized image 96. The nodularity in the left adnexa may represent the left ovary although metastatic disease is not excluded as ovarian remnant tissue is thought to be more likely located along the left pelvic sidewall image 97. Small fat containing left inguinal hernia.  Mild disc degenerative change noted. L4 and L5 and L5-S1 osteoarthritic facet change noted. Lytic sacral lesion identified for example image 91. There are also ill-defined lytic lesions elsewhere in the bony  pelvis for example sagittal reformatted image 108 representative lesion. No compression deformity.  IMPRESSION: Dominant right upper outer quadrant breast mass with findings overall suggestive of stage IV metastatic disease involving lungs, liver, and bone as above. Multi station lymphadenopathy is also described as above. If the target for biopsy is needed, the dominant right upper outer quadrant breast mass and liver masses would likely be most amenable to percutaneous sampling.  Pathologic left lateral ninth rib fracture. This is associated with the lytic presumably metastatic lesion with soft tissue component as described above.  Left adnexal mass versus remnant ovary status post hysterectomy. This is amenable to further characterization and follow-up at presumed future restaging exams as clinically needed.   Electronically Signed   By: Conchita Paris M.D.   On: 04/08/2014 11:37   Ct Abdomen Pelvis W Contrast  04/08/2014   CLINICAL DATA:  Recent diagnosis of breast cancer, initial staging.  EXAM: CT CHEST, ABDOMEN, AND PELVIS WITH CONTRAST  TECHNIQUE: Multidetector CT imaging of the chest, abdomen and pelvis was performed following the standard protocol during bolus administration of intravenous contrast.  CONTRAST:  1 OMNIPAQUE IOHEXOL 300 MG/ML SOLN, 151mL OMNIPAQUE IOHEXOL 300 MG/ML SOLN  COMPARISON:  None.  FINDINGS: CT CHEST FINDINGS  Mild asymmetric fullness of the left thyroid lobe may suggest underlying nodule, with punctate calcification identified image 10. Great vessels are normal in caliber. Heart size is normal. Small, right greater than left, pleural effusions are  identified. Associated compressive atelectasis is noted. No pericardial effusion.  There is a centrally necrotic right upper outer quadrant breast mass measuring 6.2 x 6.0 x 7.2 cm image 27. This is immediately contiguous with the overlying skin. No discernible fat plane is identified between the mass and the underlying pectoralis  minor muscle, for example image 26. Associated right breast skin and trabecular thickening are identified. Centrally hypodense likely necrotic dominant right axillary lymphadenopathy is present, largest representative node measuring 2.6 cm in short axis diameter image 19. Retropectoral lymphadenopathy is also identified, representative 1.4 cm level 3 retropectoral node identified image 12. Several left axillary nodes are identified with prominent cortices subjectively, but no adenopathy. AP window lymphadenopathy is identified, largest 1.6 cm image 20. Pretracheal lymphadenopathy measures 1.3 cm image 20. Right hilar lymphadenopathy measures 1.1 cm image 26. Epicardial lymphadenopathy measures 0.8 cm image 42.  Innumerable oval and round pulmonary masses are identified throughout both lungs, largest right upper lobe abutting the major fissure measuring 1.3 cm image 21.  Bone windows demonstrate multiple lytic lesions in the thoracic spine for example image 40, and sternum image 21. Multiple lytic rib lesions are also noted, including expansile left ninth lateral rib lesion with surrounding soft tissue component and pathologic fracture image 41.  CT ABDOMEN AND PELVIS FINDINGS  There are innumerable irregular hypodense hepatic masses with rim enhancement including a dominant lateral segment left hepatic lobe mass measuring at least 6.3 x 5.2 cm image 59. Several of these hepatic lesions are associated with capsular retraction and trace perihepatic ascites is noted, for example image 55. Porta hepatis node versus exophytic hepatic mass measures 1.9 cm image 60. Peritoneal nodules are also identified, for example right upper quadrant measuring 0.8 cm image 69.  Multiple hypodense splenic lesions are identified, largest 1.1 cm image 59. Statistically these are most likely cysts or lymphangiomas, less likely hemangiomas, although metastatic disease is within the differential diagnosis.  Fat density 0.7 cm right upper  renal pole angiomyolipoma identified image 62. No hydroureteronephrosis. Adrenal glands, pancreas, and collapse gallbladder are unremarkable. Ascites tracks to the pelvis, overall small in volume. No bowel wall thickening or focal segmental dilatation is identified. The appendix is normal. Bladder is normal.  In the left adnexa, there is nodularity measuring 2.7 cm image 101. The patient is status post hysterectomy. Right ovary is likely visualized image 96. The nodularity in the left adnexa may represent the left ovary although metastatic disease is not excluded as ovarian remnant tissue is thought to be more likely located along the left pelvic sidewall image 97. Small fat containing left inguinal hernia.  Mild disc degenerative change noted. L4 and L5 and L5-S1 osteoarthritic facet change noted. Lytic sacral lesion identified for example image 91. There are also ill-defined lytic lesions elsewhere in the bony pelvis for example sagittal reformatted image 108 representative lesion. No compression deformity.  IMPRESSION: Dominant right upper outer quadrant breast mass with findings overall suggestive of stage IV metastatic disease involving lungs, liver, and bone as above. Multi station lymphadenopathy is also described as above. If the target for biopsy is needed, the dominant right upper outer quadrant breast mass and liver masses would likely be most amenable to percutaneous sampling.  Pathologic left lateral ninth rib fracture. This is associated with the lytic presumably metastatic lesion with soft tissue component as described above.  Left adnexal mass versus remnant ovary status post hysterectomy. This is amenable to further characterization and follow-up at presumed future restaging exams as clinically needed.  Electronically Signed   By: Conchita Paris M.D.   On: 04/08/2014 11:37   US Abdomen Limited Ruq  04/07/2014   CLINICAL DATA:  Elevated hepatic function studies.  EXAM: US ABDOMEN LIMITED - RIGHT  UPPER QUADRANT  COMPARISON:  None.  FINDINGS: Gallbladder:  The gallbladder is adequately distended. No calcified stones are evident. . There is no wall thickening or pericholecystic fluid or positive sonographic Murphy's sign.  Common bile duct:  Diameter: 4 mm  Liver:  There are multiple hypoechoic masses with central areas of increased echogenicity throughout the liver. The largest demonstrated is 5.3 x 3.8 x 5.1 cm and lies in the left lobe. There is mild intrahepatic ductal dilation.  IMPRESSION: 1. There is an abnormal appearance of the liver worrisome for multiple masses likely metastatic foci. There is intrahepatic ductal dilation. Contrast-enhanced CT scanning of the abdomen and pelvis is recommended. 2. No definite gallstones are present but there may be echogenic bile or sludge. There is no sonographic evidence of acute cholecystitis.   Electronically Signed   By: David  Martinique   On: 04/07/2014 10:27    Medications: . clindamycin (CLEOCIN) IV  600 mg Intravenous 3 times per day  . escitalopram  20 mg Oral Daily  . lactose free nutrition  237 mL Oral BID BM  . potassium chloride  10 mEq Intravenous Once    Assessment/Plan 1.  Right breast cellulitis ?abscess hospitalized on 04/06/14 2.  Possible breast cancer, with right upper outer quadrant mass getting larger over the last 4 years, CT scan showing possible metastasis to bone, liver and lungs. 3.  Hx of Migraine 4.  Hx of gastric ulcer 5.  Hypertension 6.  Hx of depression 7.  Anemia  Plan:  Cellulitis is improving, Mass unchanged, less drainage and it's dry right now.  CT scan results above.  She is for needle biopsy today by IR.  I have added SCD's for DVT.  We will continue to follow with you  She has had 4 days of clindomycin, blood culture is pending, urine culture is >100K multiple species, I do not see a wound culture.   LOS: 4 days    Earnstine Regal 04/09/2014

## 2014-04-09 NOTE — Telephone Encounter (Signed)
Ok to let sister know, I have reviewed the chart, and her care is appropriate.  I hope all goes well.

## 2014-04-09 NOTE — Telephone Encounter (Signed)
Patient and sister informed of MD response.

## 2014-04-09 NOTE — Consult Note (Signed)
HPI: Mary [name] is an 58 y.o. female who has been admitted with large right fungating breast mass. Further workup has found evidence concerning for metastatic disease to her lung, liver , and bones. IR is asked to obtain tissue biopsy for diagnosis. Imaging reviewed by Dr. Annamaria Boots. Liver lesions are worrisome for metastatic process and are amenable to biopsy, which would also offer some staging. Pt apparently not aware of CT results and I had to inform her of the liver lesions. She is agreeable to biopsy of liver lesion. Also d/w Dr. Redmond Pulling of [facility], who is also agreeable to liver biopsy. PMHx and meds reviewed.  Past Medical History:  Past Medical History  Diagnosis Date  . Arthritis   . Depression   . Fainting   . Headache   . Hypertension   . Migraines   . History of blood transfusion   . Gastric ulcer     Past Surgical History:  Past Surgical History  Procedure Laterality Date  . Abdominal hysterectomy  15 years go    partial    Family History:  Family History  Problem Relation Age of Onset  . Arthritis Other   . Hypertension Other   . Hypertension Other   . Heart disease Other   . Stroke Other     Social History:  reports that she has never smoked. She has never used smokeless tobacco. She reports that she does not drink alcohol or use illicit drugs.  Allergies:  Allergies  Allergen Reactions  . Hydrocodone     "makes me crawl on the floor"  . Penicillins     swelling    Medications:   Medication List    ASK your doctor about these medications       amLODipine 5 MG tablet  Commonly known as:  NORVASC  Take 1 tablet (5 mg total) by mouth daily.     aspirin 81 MG EC tablet  Take 1 tablet (81 mg total) by mouth daily. Swallow whole.     escitalopram 20 MG tablet  Commonly known as:  LEXAPRO  Take 1 tablet (20 mg total) by mouth daily.     levofloxacin 500 MG tablet  Commonly known as:  LEVAQUIN  Take 1 tablet (500 mg total) by mouth daily.      lisinopril 40 MG tablet  Commonly known as:  PRINIVIL,ZESTRIL  Take 1 tablet (40 mg total) by mouth daily.     naproxen 500 MG tablet  Commonly known as:  NAPROSYN  Take 1 tablet (500 mg total) by mouth 2 (two) times daily with a meal.     SUMAtriptan 100 MG tablet  Commonly known as:  IMITREX  Take 1 tablet (100 mg total) by mouth every 2 (two) hours as needed for migraine.        Please HPI for pertinent positives, otherwise complete 10 system ROS negative.  Physical Exam: BP 151/85  Pulse 89  Temp(Src) 97.6 F (36.4 C) (Oral)  Resp 20  Ht $R'5\' 4"'UV$  (1.626 m)  Wt 169 lb (76.658 kg)  BMI 28.99 kg/m2  SpO2 94% Body mass index is 28.99 kg/(m^2).   General Appearance:  Alert, cooperative, no distress, appears stated age  Head:  Normocephalic, without obvious abnormality, atraumatic  ENT: Unremarkable airway  Neck: Supple, symmetrical, trachea midline  Lungs:   Clear to auscultation bilaterally, no w/r/r, respirations unlabored without use of accessory muscles.  Chest Wall:  Fungating (R) breast mass  Heart:  Regular rate and rhythm,  S1, S2 normal, no murmur, rub or gallop.  Abdomen:   Soft, non-tender, non distended.  Neurologic: Normal affect, no gross deficits.   Results for orders placed during the hospital encounter of 04/05/14 (from the past 48 hour(s))  PROTIME-INR     Status: Abnormal   Collection Time    04/09/14  4:50 AM      Result Value Ref Range   Prothrombin Time 15.5 (*) 11.6 - 15.2 seconds   INR 1.26  0.00 - 1.49  APTT     Status: Abnormal   Collection Time    04/09/14  4:50 AM      Result Value Ref Range   aPTT 40 (*) 24 - 37 seconds   Comment:            IF BASELINE aPTT IS ELEVATED,     SUGGEST PATIENT RISK ASSESSMENT     BE USED TO DETERMINE APPROPRIATE     ANTICOAGULANT THERAPY.  CBC     Status: Abnormal   Collection Time    04/09/14  4:50 AM      Result Value Ref Range   WBC 10.6 (*) 4.0 - 10.5 K/uL   RBC 2.76 (*) 3.87 - 5.11 MIL/uL    Hemoglobin 8.2 (*) 12.0 - 15.0 g/dL   HCT 24.7 (*) 36.0 - 46.0 %   MCV 89.5  78.0 - 100.0 fL   MCH 29.7  26.0 - 34.0 pg   MCHC 33.2  30.0 - 36.0 g/dL   RDW 15.2  11.5 - 15.5 %   Platelets 541 (*) 150 - 400 K/uL   Comment: REPEATED TO VERIFY     DELTA CHECK NOTED  BASIC METABOLIC PANEL     Status: Abnormal   Collection Time    04/09/14  4:50 AM      Result Value Ref Range   Sodium 137  137 - 147 mEq/L   Potassium 3.5 (*) 3.7 - 5.3 mEq/L   Chloride 101  96 - 112 mEq/L   CO2 23  19 - 32 mEq/L   Glucose, Bld 98  70 - 99 mg/dL   BUN 5 (*) 6 - 23 mg/dL   Creatinine, Ser 0.59  0.50 - 1.10 mg/dL   Calcium 9.3  8.4 - 10.5 mg/dL   GFR calc non Af Amer >90  >90 mL/min   GFR calc Af Amer >90  >90 mL/min   Comment: (NOTE)     The eGFR has been calculated using the CKD EPI equation.     This calculation has not been validated in all clinical situations.     eGFR's persistently <90 mL/min signify possible Chronic Kidney     Disease.  MAGNESIUM     Status: None   Collection Time    04/09/14  4:50 AM      Result Value Ref Range   Magnesium 1.9  1.5 - 2.5 mg/dL   Ct Chest W Contrast  04/08/2014   CLINICAL DATA:  Recent diagnosis of breast cancer, initial staging.  EXAM: CT CHEST, ABDOMEN, AND PELVIS WITH CONTRAST  TECHNIQUE: Multidetector CT imaging of the chest, abdomen and pelvis was performed following the standard protocol during bolus administration of intravenous contrast.  CONTRAST:  1 OMNIPAQUE IOHEXOL 300 MG/ML SOLN, 179mL OMNIPAQUE IOHEXOL 300 MG/ML SOLN  COMPARISON:  None.  FINDINGS: CT CHEST FINDINGS  Mild asymmetric fullness of the left thyroid lobe may suggest underlying nodule, with punctate calcification identified image 10. Great vessels are normal in caliber.  Heart size is normal. Small, right greater than left, pleural effusions are identified. Associated compressive atelectasis is noted. No pericardial effusion.  There is a centrally necrotic right upper outer quadrant breast  mass measuring 6.2 x 6.0 x 7.2 cm image 27. This is immediately contiguous with the overlying skin. No discernible fat plane is identified between the mass and the underlying pectoralis minor muscle, for example image 26. Associated right breast skin and trabecular thickening are identified. Centrally hypodense likely necrotic dominant right axillary lymphadenopathy is present, largest representative node measuring 2.6 cm in short axis diameter image 19. Retropectoral lymphadenopathy is also identified, representative 1.4 cm level 3 retropectoral node identified image 12. Several left axillary nodes are identified with prominent cortices subjectively, but no adenopathy. AP window lymphadenopathy is identified, largest 1.6 cm image 20. Pretracheal lymphadenopathy measures 1.3 cm image 20. Right hilar lymphadenopathy measures 1.1 cm image 26. Epicardial lymphadenopathy measures 0.8 cm image 42.  Innumerable oval and round pulmonary masses are identified throughout both lungs, largest right upper lobe abutting the major fissure measuring 1.3 cm image 21.  Bone windows demonstrate multiple lytic lesions in the thoracic spine for example image 40, and sternum image 21. Multiple lytic rib lesions are also noted, including expansile left ninth lateral rib lesion with surrounding soft tissue component and pathologic fracture image 41.  CT ABDOMEN AND PELVIS FINDINGS  There are innumerable irregular hypodense hepatic masses with rim enhancement including a dominant lateral segment left hepatic lobe mass measuring at least 6.3 x 5.2 cm image 59. Several of these hepatic lesions are associated with capsular retraction and trace perihepatic ascites is noted, for example image 55. Porta hepatis node versus exophytic hepatic mass measures 1.9 cm image 60. Peritoneal nodules are also identified, for example right upper quadrant measuring 0.8 cm image 69.  Multiple hypodense splenic lesions are identified, largest 1.1 cm image 59.  Statistically these are most likely cysts or lymphangiomas, less likely hemangiomas, although metastatic disease is within the differential diagnosis.  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 No hydroureteronephrosis. Adrenal glands, pancreas, and collapse gallbladder are unremarkable. Ascites tracks to the pelvis, overall small in volume. No bowel wall thickening or focal segmental dilatation is identified. The appendix is normal. Bladder is normal.  In the left adnexa, there is nodularity measuring 2.7 cm image 101. The patient is status post hysterectomy. Right ovary is likely visualized image 96. The nodularity in the left adnexa may represent the left ovary although metastatic disease is not excluded as ovarian remnant tissue is thought to be more likely located along the left pelvic sidewall image 97. Small fat containing left inguinal hernia.  Mild disc degenerative change noted. L4 and L5 and L5-S1 osteoarthritic facet change noted. Lytic sacral lesion identified for example image 91. There are also ill-defined lytic lesions elsewhere in the bony pelvis for example sagittal reformatted image 108 representative lesion. No compression deformity.  IMPRESSION: Dominant right upper outer quadrant breast mass with findings overall suggestive of stage IV metastatic disease involving lungs, liver, and bone as above. Multi station lymphadenopathy is also described as above. If the target for biopsy is needed, the dominant right upper outer quadrant breast mass and liver masses would likely be most amenable to percutaneous sampling.  Pathologic left lateral ninth rib fracture. This is associated with the lytic presumably metastatic lesion with soft tissue component as described above.  Left adnexal mass versus remnant ovary status post hysterectomy. This is amenable to  further characterization and follow-up at presumed future restaging exams as clinically needed.   Electronically  Signed   By: Conchita Paris M.D.   On: 04/08/2014 11:37   Ct Abdomen Pelvis W Contrast  04/08/2014   CLINICAL DATA:  Recent diagnosis of breast cancer, initial staging.  EXAM: CT CHEST, ABDOMEN, AND PELVIS WITH CONTRAST  TECHNIQUE: Multidetector CT imaging of the chest, abdomen and pelvis was performed following the standard protocol during bolus administration of intravenous contrast.  CONTRAST:  1 OMNIPAQUE IOHEXOL 300 MG/ML SOLN, 128mL OMNIPAQUE IOHEXOL 300 MG/ML SOLN  COMPARISON:  None.  FINDINGS: CT CHEST FINDINGS  Mild asymmetric fullness of the left thyroid lobe may suggest underlying nodule, with punctate calcification identified image 10. Great vessels are normal in caliber. Heart size is normal. Small, right greater than left, pleural effusions are identified. Associated compressive atelectasis is noted. No pericardial effusion.  There is a centrally necrotic right upper outer quadrant breast mass measuring 6.2 x 6.0 x 7.2 cm image 27. This is immediately contiguous with the overlying skin. No discernible fat plane is identified between the mass and the underlying pectoralis minor muscle, for example image 26. Associated right breast skin and trabecular thickening are identified. Centrally hypodense likely necrotic dominant right axillary lymphadenopathy is present, largest representative node measuring 2.6 cm in short axis diameter image 19. Retropectoral lymphadenopathy is also identified, representative 1.4 cm level 3 retropectoral node identified image 12. Several left axillary nodes are identified with prominent cortices subjectively, but no adenopathy. AP window lymphadenopathy is identified, largest 1.6 cm image 20. Pretracheal lymphadenopathy measures 1.3 cm image 20. Right hilar lymphadenopathy measures 1.1 cm image 26. Epicardial lymphadenopathy measures 0.8 cm image 42.  Innumerable oval and round pulmonary masses are identified throughout both lungs, largest right upper lobe abutting the  major fissure measuring 1.3 cm image 21.  Bone windows demonstrate multiple lytic lesions in the thoracic spine for example image 40, and sternum image 21. Multiple lytic rib lesions are also noted, including expansile left ninth lateral rib lesion with surrounding soft tissue component and pathologic fracture image 41.  CT ABDOMEN AND PELVIS FINDINGS  There are innumerable irregular hypodense hepatic masses with rim enhancement including a dominant lateral segment left hepatic lobe mass measuring at least 6.3 x 5.2 cm image 59. Several of these hepatic lesions are associated with capsular retraction and trace perihepatic ascites is noted, for example image 55. Porta hepatis node versus exophytic hepatic mass measures 1.9 cm image 60. Peritoneal nodules are also identified, for example right upper quadrant measuring 0.8 cm image 69.  Multiple hypodense splenic lesions are identified, largest 1.1 cm image 59. Statistically these are most likely cysts or lymphangiomas, less likely hemangiomas, although metastatic disease is within the differential diagnosis.  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 No hydroureteronephrosis. Adrenal glands, pancreas, and collapse gallbladder are unremarkable. Ascites tracks to the pelvis, overall small in volume. No bowel wall thickening or focal segmental dilatation is identified. The appendix is normal. Bladder is normal.  In the left adnexa, there is nodularity measuring 2.7 cm image 101. The patient is status post hysterectomy. Right ovary is likely visualized image 96. The nodularity in the left adnexa may represent the left ovary although metastatic disease is not excluded as ovarian remnant tissue is thought to be more likely located along the left pelvic sidewall image 97. Small fat containing left inguinal hernia.  Mild disc degenerative change noted. L4 and L5 and L5-S1  osteoarthritic facet change noted. Lytic sacral lesion identified for  example image 91. There are also ill-defined lytic lesions elsewhere in the bony pelvis for example sagittal reformatted image 108 representative lesion. No compression deformity.  IMPRESSION: Dominant right upper outer quadrant breast mass with findings overall suggestive of stage IV metastatic disease involving lungs, liver, and bone as above. Multi station lymphadenopathy is also described as above. If the target for biopsy is needed, the dominant right upper outer quadrant breast mass and liver masses would likely be most amenable to percutaneous sampling.  Pathologic left lateral ninth rib fracture. This is associated with the lytic presumably metastatic lesion with soft tissue component as described above.  Left adnexal mass versus remnant ovary status post hysterectomy. This is amenable to further characterization and follow-up at presumed future restaging exams as clinically needed.   Electronically Signed   By: Conchita Paris M.D.   On: 04/08/2014 11:37    Assessment/Plan Large right fungating breast mass with numerous liver, lung, and bony lesions. For US guided biopsy of liver lesion. Explained procedure, risks, complications, use of sedation with pt. Labs reviewed. Consent obtained in chart.  Ascencion Dike PA-C 04/09/2014, 11:26 AM

## 2014-04-09 NOTE — Progress Notes (Addendum)
Patient had biopsy done today and results are still pending, Dr Jana Hakim went to see patient with family members at  bedside: husband, sister, and mother with friends. 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 Dr had no compassion, and showed no empathy for the patient or family present. Patient and family, stated,"We do not want this Dr Jana Hakim back in our present, for he made [place] feel like he did not care, and just told us to go on the Internet and find a diagnose and cure for our self, wrote some information on a piece of paper and give it to [place], took away any hope that we might have had," patient in stable condition at this time, spoke with family and patient and inform them that maybe it was just miscommunication that took place and I do not think the Dr meant it the way it came across to you guys, family and patient still concern and do not want Dr Jana Hakim to come back in patient's room

## 2014-04-09 NOTE — Progress Notes (Signed)
[facility] PROGRESS NOTE  Mary [name] [id number] DOB: May 15, 1956 DOA: 04/05/2014 PCP: [name], MD  Assessment/Plan: Cellulitis of Right Breast -Continue improvement with IV abx.  Right Breast Mass -Staging CT scans done with evidence of metastatic disease. -s/p liver biopsy today. -Have requested oncology consultation.  Sepsis -Due to above. -Sepsis parameters resolved.  Mild Transaminitis -Due to metastatic disease.  Code Status: Full Code Family Communication: Patient only  Disposition Plan: Home when ready   Consultants:  Surgery  IR  Oncology  Antibiotics:  Clindamycin   Subjective: Feels better. Breast redness improved.  Objective: Filed Vitals:   04/09/14 1151 04/09/14 1154 04/09/14 1157 04/09/14 1338  BP: 144/77 152/75 138/74 152/88  Pulse: 88 88 89 91  Temp:    97.9 F (36.6 C)  TempSrc:    Oral  Resp: 17 15 17 18   Height:      Weight:      SpO2: 96% 96% 94% 97%    Intake/Output Summary (Last 24 hours) at 04/09/14 1457 Last data filed at 04/09/14 0559  Gross per 24 hour  Intake 2228.75 ml  Output    300 ml  Net 1928.75 ml   Filed Weights   04/05/14 1320  Weight: 76.658 kg (169 lb)    Exam:   General:  AA Ox3  Cardiovascular: RRR  Respiratory: CTA B  Abdomen: S/NT/ND/+BS  Extremities: no C/C/E   Neurologic:  Non-focal  Data Reviewed: Basic Metabolic Panel:  Recent Labs Lab 04/05/14 1410 04/06/14 0540 04/07/14 0630 04/09/14 0450  NA 134* 136* 137 137  K 4.3 3.7 3.6* 3.5*  CL 97 102 102 101  CO2 22 19 22 23   GLUCOSE 102* 96 103* 98  BUN 17 13 9  5*  CREATININE 0.94 0.68 0.67 0.59  CALCIUM 9.7 9.0 9.3 9.3  MG  --   --   --  1.9   Liver Function Tests:  Recent Labs Lab 04/05/14 1410 04/06/14 0540 04/07/14 0630  AST 85* 85* 74*  ALT 44* 37* 34  ALKPHOS 170* 161* 179*  BILITOT 1.2 1.2 0.7  PROT 7.4 6.5 6.7  ALBUMIN 2.9* 2.5* 2.4*   No results found for this basename: LIPASE,  AMYLASE,  in the last 168 hours No results found for this basename: AMMONIA,  in the last 168 hours CBC:  Recent Labs Lab 04/05/14 1410 04/06/14 0540 04/07/14 0630 04/09/14 0450  WBC 16.7* 14.8* 13.1* 10.6*  NEUTROABS 13.1*  --   --   --   HGB 9.5* 8.4* 7.8* 8.2*  HCT 27.8* 24.0* 23.6* 24.7*  MCV 90.0 87.9 89.7 89.5  PLT 343 337 398 541*   Cardiac Enzymes: No results found for this basename: CKTOTAL, CKMB, CKMBINDEX, TROPONINI,  in the last 168 hours BNP (last 3 results) No results found for this basename: PROBNP,  in the last 8760 hours CBG:  Recent Labs Lab 04/05/14 1634  GLUCAP 92    Recent Results (from the past 240 hour(s))  URINE CULTURE     Status: None   Collection Time    04/05/14  3:39 PM      Result Value Ref Range Status   Specimen Description URINE, CLEAN CATCH   Final   Special Requests NONE   Final   Culture  Setup Time     Final   Value: 04/05/2014 22:59     Performed at [facility]     Final   Value: >=100,000 COLONIES/ML  Performed at [facility]     Final   Value: Multiple bacterial morphotypes present, none predominant. Suggest appropriate recollection if clinically indicated.     Performed at [facility]   Report Status 04/06/2014 FINAL   Final  CULTURE, BLOOD (ROUTINE X 2)     Status: None   Collection Time    04/05/14  5:30 PM      Result Value Ref Range Status   Specimen Description BLOOD LEFT HAND   Final   Special Requests BOTTLES DRAWN AEROBIC AND ANAEROBIC 4CC   Final   Culture  Setup Time     Final   Value: 04/05/2014 22:25     Performed at [facility]   Culture     Final   Value:        BLOOD CULTURE RECEIVED NO GROWTH TO DATE CULTURE WILL BE HELD FOR 5 DAYS BEFORE ISSUING A FINAL NEGATIVE REPORT     Performed at [facility]   Report Status PENDING   Incomplete  CULTURE, BLOOD (ROUTINE X 2)     Status: None   Collection Time    04/05/14  5:34 PM       Result Value Ref Range Status   Specimen Description BLOOD RIGHT ANTECUBITAL   Final   Special Requests BOTTLES DRAWN AEROBIC AND ANAEROBIC 5CC   Final   Culture  Setup Time     Final   Value: 04/05/2014 22:25     Performed at [facility]   Culture     Final   Value:        BLOOD CULTURE RECEIVED NO GROWTH TO DATE CULTURE WILL BE HELD FOR 5 DAYS BEFORE ISSUING A FINAL NEGATIVE REPORT     Performed at [facility]   Report Status PENDING   Incomplete     Studies: Ct Chest W Contrast  04/08/2014   CLINICAL DATA:  Recent diagnosis of breast cancer, initial staging.  EXAM: CT CHEST, ABDOMEN, AND PELVIS WITH CONTRAST  TECHNIQUE: Multidetector CT imaging of the chest, abdomen and pelvis was performed following the standard protocol during bolus administration of intravenous contrast.  CONTRAST:  1 OMNIPAQUE IOHEXOL 300 MG/ML SOLN, 166mL OMNIPAQUE IOHEXOL 300 MG/ML SOLN  COMPARISON:  None.  FINDINGS: CT CHEST FINDINGS  Mild asymmetric fullness of the left thyroid lobe may suggest underlying nodule, with punctate calcification identified image 10. Great vessels are normal in caliber. Heart size is normal. Small, right greater than left, pleural effusions are identified. Associated compressive atelectasis is noted. No pericardial effusion.  There is a centrally necrotic right upper outer quadrant breast mass measuring 6.2 x 6.0 x 7.2 cm image 27. This is immediately contiguous with the overlying skin. No discernible fat plane is identified between the mass and the underlying pectoralis minor muscle, for example image 26. Associated right breast skin and trabecular thickening are identified. Centrally hypodense likely necrotic dominant right axillary lymphadenopathy is present, largest representative node measuring 2.6 cm in short axis diameter image 19. Retropectoral lymphadenopathy is also identified, representative 1.4 cm level 3 retropectoral node identified image 12. Several left axillary  nodes are identified with prominent cortices subjectively, but no adenopathy. AP window lymphadenopathy is identified, largest 1.6 cm image 20. Pretracheal lymphadenopathy measures 1.3 cm image 20. Right hilar lymphadenopathy measures 1.1 cm image 26. Epicardial lymphadenopathy measures 0.8 cm image 42.  Innumerable oval and round pulmonary masses are identified throughout both lungs, largest right upper lobe  abutting the major fissure measuring 1.3 cm image 21.  Bone windows demonstrate multiple lytic lesions in the thoracic spine for example image 40, and sternum image 21. Multiple lytic rib lesions are also noted, including expansile left ninth lateral rib lesion with surrounding soft tissue component and pathologic fracture image 41.  CT ABDOMEN AND PELVIS FINDINGS  There are innumerable irregular hypodense hepatic masses with rim enhancement including a dominant lateral segment left hepatic lobe mass measuring at least 6.3 x 5.2 cm image 59. Several of these hepatic lesions are associated with capsular retraction and trace perihepatic ascites is noted, for example image 55. Porta hepatis node versus exophytic hepatic mass measures 1.9 cm image 60. Peritoneal nodules are also identified, for example right upper quadrant measuring 0.8 cm image 69.  Multiple hypodense splenic lesions are identified, largest 1.1 cm image 59. Statistically these are most likely cysts or lymphangiomas, less likely hemangiomas, although metastatic disease is within the differential diagnosis.  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 No hydroureteronephrosis. Adrenal glands, pancreas, and collapse gallbladder are unremarkable. Ascites tracks to the pelvis, overall small in volume. No bowel wall thickening or focal segmental dilatation is identified. The appendix is normal. Bladder is normal.  In the left adnexa, there is nodularity measuring 2.7 cm image 101. The patient is status post  hysterectomy. Right ovary is likely visualized image 96. The nodularity in the left adnexa may represent the left ovary although metastatic disease is not excluded as ovarian remnant tissue is thought to be more likely located along the left pelvic sidewall image 97. Small fat containing left inguinal hernia.  Mild disc degenerative change noted. L4 and L5 and L5-S1 osteoarthritic facet change noted. Lytic sacral lesion identified for example image 91. There are also ill-defined lytic lesions elsewhere in the bony pelvis for example sagittal reformatted image 108 representative lesion. No compression deformity.  IMPRESSION: Dominant right upper outer quadrant breast mass with findings overall suggestive of stage IV metastatic disease involving lungs, liver, and bone as above. Multi station lymphadenopathy is also described as above. If the target for biopsy is needed, the dominant right upper outer quadrant breast mass and liver masses would likely be most amenable to percutaneous sampling.  Pathologic left lateral ninth rib fracture. This is associated with the lytic presumably metastatic lesion with soft tissue component as described above.  Left adnexal mass versus remnant ovary status post hysterectomy. This is amenable to further characterization and follow-up at presumed future restaging exams as clinically needed.   Electronically Signed   By: Conchita Paris M.D.   On: 04/08/2014 11:37   Ct Abdomen Pelvis W Contrast  04/08/2014   CLINICAL DATA:  Recent diagnosis of breast cancer, initial staging.  EXAM: CT CHEST, ABDOMEN, AND PELVIS WITH CONTRAST  TECHNIQUE: Multidetector CT imaging of the chest, abdomen and pelvis was performed following the standard protocol during bolus administration of intravenous contrast.  CONTRAST:  1 OMNIPAQUE IOHEXOL 300 MG/ML SOLN, 155mL OMNIPAQUE IOHEXOL 300 MG/ML SOLN  COMPARISON:  None.  FINDINGS: CT CHEST FINDINGS  Mild asymmetric fullness of the left thyroid lobe may  suggest underlying nodule, with punctate calcification identified image 10. Great vessels are normal in caliber. Heart size is normal. Small, right greater than left, pleural effusions are identified. Associated compressive atelectasis is noted. No pericardial effusion.  There is a centrally necrotic right upper outer quadrant breast mass measuring 6.2 x 6.0 x 7.2 cm image 27. This is immediately contiguous  with the overlying skin. No discernible fat plane is identified between the mass and the underlying pectoralis minor muscle, for example image 26. Associated right breast skin and trabecular thickening are identified. Centrally hypodense likely necrotic dominant right axillary lymphadenopathy is present, largest representative node measuring 2.6 cm in short axis diameter image 19. Retropectoral lymphadenopathy is also identified, representative 1.4 cm level 3 retropectoral node identified image 12. Several left axillary nodes are identified with prominent cortices subjectively, but no adenopathy. AP window lymphadenopathy is identified, largest 1.6 cm image 20. Pretracheal lymphadenopathy measures 1.3 cm image 20. Right hilar lymphadenopathy measures 1.1 cm image 26. Epicardial lymphadenopathy measures 0.8 cm image 42.  Innumerable oval and round pulmonary masses are identified throughout both lungs, largest right upper lobe abutting the major fissure measuring 1.3 cm image 21.  Bone windows demonstrate multiple lytic lesions in the thoracic spine for example image 40, and sternum image 21. Multiple lytic rib lesions are also noted, including expansile left ninth lateral rib lesion with surrounding soft tissue component and pathologic fracture image 41.  CT ABDOMEN AND PELVIS FINDINGS  There are innumerable irregular hypodense hepatic masses with rim enhancement including a dominant lateral segment left hepatic lobe mass measuring at least 6.3 x 5.2 cm image 59. Several of these hepatic lesions are associated  with capsular retraction and trace perihepatic ascites is noted, for example image 55. Porta hepatis node versus exophytic hepatic mass measures 1.9 cm image 60. Peritoneal nodules are also identified, for example right upper quadrant measuring 0.8 cm image 69.  Multiple hypodense splenic lesions are identified, largest 1.1 cm image 59. Statistically these are most likely cysts or lymphangiomas, less likely hemangiomas, although metastatic disease is within the differential diagnosis.  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 No hydroureteronephrosis. Adrenal glands, pancreas, and collapse gallbladder are unremarkable. Ascites tracks to the pelvis, overall small in volume. No bowel wall thickening or focal segmental dilatation is identified. The appendix is normal. Bladder is normal.  In the left adnexa, there is nodularity measuring 2.7 cm image 101. The patient is status post hysterectomy. Right ovary is likely visualized image 96. The nodularity in the left adnexa may represent the left ovary although metastatic disease is not excluded as ovarian remnant tissue is thought to be more likely located along the left pelvic sidewall image 97. Small fat containing left inguinal hernia.  Mild disc degenerative change noted. L4 and L5 and L5-S1 osteoarthritic facet change noted. Lytic sacral lesion identified for example image 91. There are also ill-defined lytic lesions elsewhere in the bony pelvis for example sagittal reformatted image 108 representative lesion. No compression deformity.  IMPRESSION: Dominant right upper outer quadrant breast mass with findings overall suggestive of stage IV metastatic disease involving lungs, liver, and bone as above. Multi station lymphadenopathy is also described as above. If the target for biopsy is needed, the dominant right upper outer quadrant breast mass and liver masses would likely be most amenable to percutaneous sampling.  Pathologic  left lateral ninth rib fracture. This is associated with the lytic presumably metastatic lesion with soft tissue component as described above.  Left adnexal mass versus remnant ovary status post hysterectomy. This is amenable to further characterization and follow-up at presumed future restaging exams as clinically needed.   Electronically Signed   By: Conchita Paris M.D.   On: 04/08/2014 11:37   US Biopsy  04/09/2014   CLINICAL DATA:  Hepatic metastases, known large right breast mass  with axillary lymph nodes, clinical diagnosis is suspected metastatic breast cancer  EXAM: ULTRASOUND GUIDED CORE BIOPSY OF LEFT LIVER MASS  MEDICATIONS: 1 mg IV Versed; 100 mcg IV Fentanyl  Total Moderate Sedation Time: 10  PROCEDURE: The procedure, risks, benefits, and alternatives were explained to the patient. Questions regarding the procedure were encouraged and answered. The patient understands and consents to the procedure.  The SUBXIPHOID REGION was prepped with BETADINE in a sterile fashion, and a sterile drape was applied covering the operative field. A sterile gown and sterile gloves were used for the procedure. Local anesthesia was provided with 1% Lidocaine.  Previous imaging reviewed. Large left hepatic mass is well visualized from a subxiphoid approach. Under sterile conditions and local anesthesia, a 17 gauge 6.8 cm access needle was advanced into the left liver mass with direct ultrasound guidance. Needle position confirmed. 3 18 gauge core biopsies obtained. Samples were placed in formalin. No immediate complication. Needle tract embolized with Gelfoam pledgets. Postprocedure imaging demonstrates no hemorrhage or hematoma. Patient tolerated the biopsy well.  COMPLICATIONS: None.  FINDINGS: Imaging confirms needle placement in the large left hepatic mass within the lateral segment.  IMPRESSION: Successful ultrasound left liver mass 18 gauge core biopsy   Electronically Signed   By: Daryll Brod M.D.   On:  04/09/2014 12:21    Scheduled Meds: . clindamycin (CLEOCIN) IV  600 mg Intravenous 3 times per day  . escitalopram  20 mg Oral Daily  . fentaNYL      . lactose free nutrition  237 mL Oral BID BM  . midazolam       Continuous Infusions: . sodium chloride 75 mL/hr at 04/09/14 0559    Principal Problem:   Sepsis Active Problems:   Hypertension   Fever, unspecified   Cellulitis   Migraine headache   Elevated liver enzymes   Protein-calorie malnutrition, severe    Time spent: 25 minutes. Greater than 50% of this time was spent in direct contact with the patient coordinating care.    [facility] Pager [phone]  If 7PM-7AM, please contact night-coverage at [email or web address], password [facility] 04/09/2014, 2:57 PM  LOS: 4 days

## 2014-04-09 NOTE — Progress Notes (Signed)
04/09/2014 0900 NCM spoke to pt and states the letter from [facility] requires her to pay $230 to reinstate her coverage. States he will pay at end of week and she will then receive her insurance card. Jonnie Finner RN CCM Case Mgmt phone [phone]

## 2014-04-09 NOTE — Consult Note (Signed)
[place]  [phone]   HEMATOLOGY ONCOLOGY CONSULTATION   Mary [name]  DOB: [birth year]  MR#: [id number]  CSN#: [id number]    Requesting Physician: [facility]  Primary MD: [name], MD at [facility]   Reason for Consultation: Breast Cancer  History of present illness:57 y.o. [place] woman is to see for evaluation of breast cancer.She was admitted on 4/11 from her primary care physician's office with a large right fungating breast mass, which per patient report was progressively growing for 4 years, initially appearing as a small ulceration, complicated with drainage,fever chills and generalized weakness.CBC showed elevated white count, and mild transaminitis. She was admitted under the hospitalist service for further evaluation and management. A CT of the chest on 4/12 confirmed a 6.2 x 6 x 7.2 cm centrally necrotic right upper outer quadrant breast mass,along with metastatic disease to the lungs liver and bone,with a pathologic left lateral ninth rib fracture. A CT of the abdomen and pelvis on 4/12 with contrast revealed innumerable liver masses, largest at the left hepatic lobe measuring at least 6.3 x 5.2 cm, as well as peritoneal nodules and suspicious splenic lesions. Small amount of ascites was noted.In the left adnexa, there is a nodularity of 2.7 cm of unknown etiology.Metastatic disease to the bone seen, especially in the bony pelvis.Interventional Radiology proceeded with biopsy of the liver lesion on 04/09/2014, with results currently pending.Surgery evaluated the patient. At this point,core biopsy of the breast is pending on oncologic recommendations. No tumor markers are available for review. We were kindly requested to see the patient in consultation anticipating that she is to need followup and treatment as an outpatient.   Past medical history:      Past Medical History  Diagnosis Date  . Arthritis   . Depression   . History of  syncopal episodes   .  Migraine Headaches   . Hypertension    Prior history of uterine fibroids   . Gastric ulcer         Iron deficiency anemia, not on Iron supplements  Past surgical history:      Past Surgical History  Procedure Laterality Date  . Abdominal hysterectomy  1990s    Partial (for fibroids)    Medications:  Scheduled Meds: . clindamycin (CLEOCIN) IV  600 mg Intravenous 3 times per day  . escitalopram  20 mg Oral Daily  . fentaNYL      . lactose free nutrition  237 mL Oral BID BM  . midazolam       Continuous Infusions: . sodium chloride 75 mL (04/09/14 1457)   PRN Meds:.oxyCODONE, SUMAtriptan  Allergies:  Allergies  Allergen Reactions  . Hydrocodone     "makes me crawl on the floor"  . Penicillins     swelling    Family history:     Family History  Problem Relation Age of Onset  . Arthritis Other   . Hypertension Other   . Hypertension Other   . Heart disease Other   . Stroke Other       Leukemia                                                             Sister  The patient's father died from a CVA. The patient's  mother is alive, 28, and ?demented. The patient has 3 bros and 2 sis "on her father's side" but is not in touch with them. She has one sister "on her mother's side", Mary [name], who has a history of leukemia ("I'm cured." Dianne lives with the patient's mother and is her primary caregiver.---No history of breast or ovarian cancer in the family.       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 At home it's just she and her husband but the patient's siste lives 'around the corner" . No children.  Code Status: Full Code    Health maintenance:               MGM: about 2 years ago in [place]. SHe states it was in a small clinic and she cannot remember the location             Colonoscopy:never.  [id number] since the 1990s  Bone density:never  Cholesterol:as per PCP             Last physical in 07/2103 She was referred for GYN but she failed to follow through .No tobacco, ETOH or recreational drug use.   Review of systems:   Constitutional: Positive for 10 lb weight loss (she was on a green tea diet) with decreased appetite. Positive for fever,night sweats,chills prior to admission Eyes: Negative for blurred vision and double vision.  Respiratory Negative for cough, hemoptysis or shortness of breath.  Cardiovascular: Negative for chest pain. Negative for palpitations.  GI: No nausea, vomiting, diarrhea, or constipation. No change in bowel caliber. No melena or hematochezia. No abdominal pain.  [id number] for hematuria. No loss of urinary control.No Urinary retention.No Vaginal bleeding. Prior history of menorrhagia due to fibroids.  Skin: Negative for itching. No rash. No petechia. No bruising. Right outer quadrant of the breast with ulcerative mass as mentioned above. Musculoskeletal: Chronic back  And right hip pain Neurological: c/o headaches and dizzyness Review of all other organs systems are otherwise negative    Physical exam:      Filed Vitals:   04/09/14 1524  BP: 163/92  Pulse: 92  Temp: 98.1 F (36.7 C)  Resp: 17    Weight change:   General:  58 y.o. female  Examined in bed HEENT: sclerae not icteric, EOMs intact,  PERRLA. Oropharynx:  Dentition in poor repair Neck supple. no thyromegaly, no cervical or supraclavicular adenopathy  Lungs auscultated anterolaterally-- no rales or wheezes. Breasts: Right outer quadrant with a central draining mass surrounded by erythematous induration of about 9 cm, irregular, non tender, movable (not attached to chest wall), Right axilla firm with fixed adenopathy Cardiac regular  rate and rhythm, no murmur, no rubs or gallops Abdomen soft nontender, bowel sounds x4.liver and spleen non palpable. GU/rectal: deferred. Extremities no clubbing, no cyanosis or edema.No bruising or petechial rash Musculoskeletal: no spinal tenderness.  Neuro: Non Focal, well oriented   Lab results:       CBC  Recent Labs Lab 04/05/14 1410 04/06/14 0540 04/07/14 0630 04/09/14 0450  WBC 16.7* 14.8* 13.1* 10.6*  HGB 9.5* 8.4* 7.8* 8.2*  HCT 27.8* 24.0* 23.6* 24.7*  PLT 343 337 398 541*  MCV 90.0 87.9 89.7 89.5  MCH 30.7 30.8 29.7 29.7  MCHC 34.2 35.0 33.1 33.2  RDW 14.6 14.6 15.3 15.2  LYMPHSABS 1.3  --   --   --   MONOABS 2.2*  --   --   --   EOSABS 0.0  --   --   --   BASOSABS 0.0  --   --   --     Anemia panel:  No results found for this basename: VITAMINB12, FOLATE, FERRITIN, TIBC, IRON, RETICCTPCT,  in the last 72 hours   Chemistries   Recent Labs Lab 04/05/14 1410 04/06/14 0540 04/07/14 0630 04/09/14 0450  NA 134* 136* 137 137  K 4.3 3.7 3.6* 3.5*  CL 97 102 102 101  CO2 22 19 22 23   GLUCOSE 102* 96 103* 98  BUN 17 13 9  5*  CREATININE 0.94 0.68 0.67 0.59  CALCIUM 9.7 9.0 9.3 9.3  MG  --   --   --  1.9     Coagulation profile  Recent Labs Lab 04/09/14 0450  INR 1.26   Hepatic Function Panel     Component Value Date/Time   PROT 6.7 04/07/2014 0630   ALBUMIN 2.4* 04/07/2014 0630   AST 74* 04/07/2014 0630   ALT 34 04/07/2014 0630   ALKPHOS 179* 04/07/2014 0630   BILITOT 0.7 04/07/2014 0630   BILIDIR 0.1 08/04/2013 1157      Studies:      Dg Thoracic Spine 2 View  04/05/2014   CLINICAL DATA:  Back pain  EXAM: THORACIC SPINE - 2 VIEW  COMPARISON:  CXR 07/04/2013  FINDINGS: There is no evidence of thoracic spine fracture. Alignment is normal. No other significant bone abnormalities are identified.  IMPRESSION: Negative.   Electronically Signed   By: Franchot Gallo M.D.   On: 04/05/2014 14:20   Ct Chest W Contrast  04/08/2014     COMPARISON:   None.  FINDINGS: CT CHEST FINDINGS  Mild asymmetric fullness of the left thyroid lobe may suggest underlying nodule, with punctate calcification identified image 10. Great vessels are normal in caliber. Heart size is normal. Small, right greater than left, pleural effusions are identified. Associated compressive atelectasis is noted. No pericardial effusion.  There is a centrally necrotic right upper outer quadrant breast mass measuring 6.2 x 6.0 x 7.2 cm image 27. This is immediately contiguous with the overlying skin. No discernible fat plane is identified between the mass and the underlying pectoralis minor muscle, for example image 26. Associated right breast skin and trabecular thickening are identified. Centrally hypodense likely necrotic dominant right axillary lymphadenopathy is present, largest representative node measuring 2.6 cm in short axis diameter image 19. Retropectoral lymphadenopathy is also identified, representative 1.4 cm level 3 retropectoral node identified image 12. Several left axillary nodes are identified with prominent cortices subjectively, but no adenopathy. AP window lymphadenopathy is identified, largest 1.6 cm image 20. Pretracheal lymphadenopathy measures 1.3 cm image 20. Right hilar lymphadenopathy measures 1.1 cm image 26. Epicardial lymphadenopathy measures 0.8 cm image 42.  Innumerable oval and round pulmonary masses are identified throughout both lungs, largest right upper lobe abutting the major fissure measuring 1.3 cm image 21.  Bone windows demonstrate multiple lytic lesions in the thoracic spine for example image 40, and sternum image 21. Multiple lytic rib lesions are also noted, including expansile left ninth lateral rib lesion with surrounding soft tissue component and pathologic fracture image 41.   [place] Chest  04/06/2014   CLINICAL DATA:  Patient admitted for right breast cellulitis. Per the admitting PA, the patient has peau d'orange of the right breast and erythema.  The PA states that there is a palpable abnormality at 11 o'clock, 14 cm from the nipple.  EXAM: ULTRASOUND OF THE RIGHT BREAST  COMPARISON:  None  FINDINGS: Physical exam could not be performed since the ultrasound was done off site due to the patient being admitted.  Targeted ultrasound at 11 o'clock, 14 cm from the nipple demonstrates a large mass with internal vascularity. This measures at least 7 x 6.3 x 5.1 cm. Due to the large size, exact measurements are difficult. Foci of increased echogenicity are noted within the mass, suggestive of calcifications.  IMPRESSION: Suspicious right breast mass.  RECOMMENDATION: A bilateral diagnostic ultrasound and possibly repeat right breast ultrasound is recommended. Following this, the patient will need an ultrasound-guided core needle biopsy of the right breast mass imaged today at 11 o'clock. Further recommendations will be made following complete workup.  Results and recommendations were discussed with PA Megan Dort at 4:20 p.m. on 04/06/2014.  BI-RADS CATEGORY  0: Incomplete. Need additional imaging evaluation and/or prior mammograms for comparison.   Electronically Signed   By: Donavan Burnet M.D.   On: 04/06/2014 16:27   Ct Abdomen Pelvis W Contrast  04/08/2014      CT ABDOMEN AND PELVIS FINDINGS  There are innumerable irregular hypodense hepatic masses with rim enhancement including a dominant lateral segment left hepatic lobe mass measuring at least 6.3 x 5.2 cm image 59. Several of these hepatic lesions are associated with capsular retraction and trace perihepatic ascites is noted, for example image 55. Porta hepatis node versus exophytic hepatic mass measures 1.9 cm image 60. Peritoneal nodules are also identified, for example right upper quadrant measuring 0.8 cm image 69.  Multiple hypodense splenic lesions are identified, largest 1.1 cm image 59. Statistically these are most likely cysts or lymphangiomas, less likely hemangiomas, although metastatic  disease is within the differential diagnosis.  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 No hydroureteronephrosis. Adrenal glands, pancreas, and collapse gallbladder are unremarkable. Ascites tracks to the pelvis, overall small in volume. No bowel wall thickening or focal segmental dilatation is identified. The appendix is normal. Bladder is normal.  In the left adnexa, there is nodularity measuring 2.7 cm image 101. The patient is status post hysterectomy. Right ovary is likely visualized image 96. The nodularity in the left adnexa may represent the left ovary although metastatic disease is not excluded as ovarian remnant tissue is thought to be more likely located along the left pelvic sidewall image 97. Small fat containing left inguinal hernia.  Mild disc degenerative change noted. L4 and L5 and L5-S1 osteoarthritic facet change noted. Lytic sacral lesion identified for example image 91. There are also ill-defined lytic lesions elsewhere in the bony pelvis for example sagittal reformatted image 108 representative lesion. No compression deformity.  IMPRESSION: Dominant right upper outer quadrant breast mass with findings overall suggestive of stage IV metastatic disease involving lungs, liver, and bone as above. Multi station lymphadenopathy is also described as above. If the target for biopsy is needed, the dominant right upper outer quadrant breast mass and liver masses would likely be most amenable to percutaneous sampling.  Pathologic left lateral ninth rib fracture. This is associated with the lytic presumably metastatic lesion with soft tissue component as described above.  Left adnexal mass versus remnant ovary status post  hysterectomy. This is amenable to further characterization and follow-up at presumed future restaging exams as clinically needed.   Electronically Signed   By: Christiana Pellant M.D.   On: 04/08/2014 11:37   US Biopsy  04/09/2014    FINDINGS: Imaging  confirms needle placement in the large left hepatic mass within the lateral segment.  IMPRESSION: Successful ultrasound left liver mass 18 gauge core biopsy   Electronically Signed   By: Ruel Favors M.D.   On: 04/09/2014 12:21   US Abdomen Limited Ruq  04/07/2014     COMPARISON:  None.  FINDINGS: Gallbladder:  The gallbladder is adequately distended. No calcified stones are evident. . There is no wall thickening or pericholecystic fluid or positive sonographic Murphy's sign.  Common bile duct:  Diameter: 4 mm  Liver:  There are multiple hypoechoic masses with central areas of increased echogenicity throughout the liver. The largest demonstrated is 5.3 x 3.8 x 5.1 cm and lies in the left lobe. There is mild intrahepatic ductal dilation.  IMPRESSION: 1. There is an abnormal appearance of the liver worrisome for multiple masses likely metastatic foci. There is intrahepatic ductal dilation. Contrast-enhanced CT scanning of the abdomen and pelvis is recommended. 2. No definite gallstones are present but there may be echogenic bile or sludge. There is no sonographic evidence of acute cholecystitis.   Electronically Signed   By: David  [place]   On: 04/07/2014 10:27    Assessmnent/Plan:57 y.o. [place] woman admitted with Right breast cellulitis and sepsis, found to have a large right outer quadrant breast mass consistent with malignancy. CT scans of the chest, abdomen and pelvis demonstrate metastatic disease to the lung, liver, bone, peritoneal nodules. Suspicious splenic lesions were seen.In addition,small amount of ascites were noted. In the left adnexa, there is a nodularity of unknown etiology.She underwent liver biopsy today, with results currently pending.  Surgery evaluated the patient, and further plans for biopsy are pending on Oncology consultation. We were kindly informed of the patient's admission to proceed with recommendations once pathology becomes available. Consider Ca 27.27 tumor marker  anticipating breast primary.She may need completion of imaging studies for staging with MRI of the brain. Radiation Oncology may consult  For radiation to the painful pelvic bone which is causing her pain. Other medical issues as per admitting team. Dr. Darnelle Catalan  is to see the patient following this consult once the pathology report becomes available and an addendum to this note is to be written. Thank you for the referral.   Marcos Eke, PA-C 04/09/2014  ADDENDUM: 58 y/o [place] woman with a clinical T3 N2-3 M1, stage IV breast cancer, involving liver, lungs, bones, lymph nodes, Left adnexa and possibly mesentery, liver biopsy 04/13 2015 with results pending.  I discussed the patient's situation for approximately one hour with the patient, her husband and her sister; the patient's mother was also present. The patient understands it appears she has stage IV breast cancer, that this is not curable, but that it is treatable. We will need to wait for pathologic confirmation and results of the breast prognostic panel from her liver biopsy (middle of next week) before starting specific therapy. In the meantime we will obtain a brain MRI and ask radiation oncology to consult for palliative treatment to painful bone areas (rib fracture).  Patient;s husband works two jobs and sister is primary caregiver for their mother--patient would benefit from an aide if that could be arranged for. All treatments I have can be given outpatient and  should not delay discharge.  The patient's family and especially her sister Mary [name] want to make sure we don't depress the patient or discourage her with negative statements. They understand what I have to offer is treatment that can help her live longer and improve her quality of life.    I personally saw this patient, amended the APP's note, and performed a substantive portion of this encounter. I will follow with you while in house and arrange for outpatient follow up  at the cancer center after discharge.  Chauncey Cruel, MD

## 2014-04-09 NOTE — Progress Notes (Signed)
Just back from IR bx of liver.  R breast stable -  No changes from admission.  [place] path  Family believes this is from abscessed tooth, explained this is cancer til proven otherwise Let us know if MedOnc requires core bx of breast  Leighton Ruff. Redmond Pulling, MD, FACS General, Bariatric, & Minimally Invasive Surgery [facility], [place]

## 2014-04-10 ENCOUNTER — Inpatient Hospital Stay (HOSPITAL_COMMUNITY): Payer: BC Managed Care – PPO

## 2014-04-10 ENCOUNTER — Ambulatory Visit: Admit: 2014-04-10 | Payer: MEDICAID | Admitting: Radiation Oncology

## 2014-04-10 LAB — CANCER ANTIGEN 27.29: CA 27.29: 1897 U/mL — AB (ref 0–39)

## 2014-04-10 MED ORDER — GADOBENATE DIMEGLUMINE 529 MG/ML IV SOLN
15.0000 mL | Freq: Once | INTRAVENOUS | Status: AC | PRN
  Administered 2014-04-10: 15 mL via INTRAVENOUS

## 2014-04-10 MED ORDER — CLINDAMYCIN HCL 300 MG PO CAPS: 600.0000 mg | ORAL_CAPSULE | Freq: Three times a day (TID) | ORAL | Status: DC

## 2014-04-10 MED ORDER — OXYCODONE HCL 5 MG PO TABS
5.0000 mg | ORAL_TABLET | Freq: Four times a day (QID) | ORAL | Status: DC | PRN
Start: 2014-04-10 — End: 2014-05-28

## 2014-04-10 NOTE — Progress Notes (Signed)
Subjective: She has less cellulitis, but she does have some drainage this AM.  I took the dressing down and she has some clear fluid, and some skin break down around the mass.  It currently does not look purulent and I did not think it necessary to culture.  Objective: Vital signs in last 24 hours: Temp:  [97.9 F (36.6 C)-98.4 F (36.9 C)] 98.4 F (36.9 C) (04/14 0900) Pulse Rate:  [80-93] 93 (04/14 0900) Resp:  [15-19] 17 (04/14 0900) BP: (137-163)/(74-92) 152/88 mmHg (04/14 0900) SpO2:  [94 %-99 %] 95 % (04/14 0900) Last BM Date: 04/09/14 480 PO Regular diet Afebrile, VSS BP up some No labs today Path is pending MRI of the Brain:  Several small calvarial metastatic lesions.  No parenchymal enhancing lesion to suggest the presence of brain parenchymal metastatic disease.  Intake/Output from previous day: 04/13 0701 - 04/14 0700 In: 2481.3 [P.O.:480; I.V.:1801.3; IV Piggyback:200] Out: -  Intake/Output this shift:    General appearance: alert, cooperative and no distress Skin: Erythema around the breast is generally better.  she has some skin around the mass that is blistering with some drainage, that is clear.  Lab Results:   Recent Labs  04/09/14 0450  WBC 10.6*  HGB 8.2*  HCT 24.7*  PLT 541*    BMET  Recent Labs  04/09/14 0450  NA 137  K 3.5*  CL 101  CO2 23  GLUCOSE 98  BUN 5*  CREATININE 0.59  CALCIUM 9.3   PT/INR  Recent Labs  04/09/14 0450  LABPROT 15.5*  INR 1.26     Recent Labs Lab 04/05/14 1410 04/06/14 0540 04/07/14 0630  AST 85* 85* 74*  ALT 44* 37* 34  ALKPHOS 170* 161* 179*  BILITOT 1.2 1.2 0.7  PROT 7.4 6.5 6.7  ALBUMIN 2.9* 2.5* 2.4*     Lipase  No results found for this basename: lipase     Studies/Results: Mr Kizzie Fantasia Contrast  04/10/2014   CLINICAL DATA:  Recent diagnosis of stage IV breast cancer. New onset of dizziness and headaches. Evaluate for possibility of intracranial metastatic disease. History of  high blood pressure and migraine headaches.  EXAM: MRI HEAD WITHOUT AND WITH CONTRAST  TECHNIQUE: Multiplanar, multiecho pulse sequences of the brain and surrounding structures were obtained without and with intravenous contrast.  CONTRAST:  54mL MULTIHANCE GADOBENATE DIMEGLUMINE 529 MG/ML IV SOLN  COMPARISON:  No comparison brain MR or head CT. Comparison CT scan of the chest, abdomen and pelvis 04/08/2014.  FINDINGS: No acute infarct.  Several small calvarial metastatic lesions (best appreciated on diffusion sequence).  Heterogeneous appearance of the bone marrow of the clivus/ skullbase and upper cervical spine without discrete bony destructive lesion.  No parenchymal enhancing lesion to suggest the presence of parenchymal metastatic disease.  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  No intracranial hemorrhage.  No hydrocephalus.  Major intracranial vascular structures are patent.  Cervical medullary junction, pituitary region, pineal region and orbital structures unremarkable.  Mucosal thickening maxillary sinuses.  IMPRESSION: Several small calvarial metastatic lesions.  No parenchymal enhancing lesion to suggest the presence of brain parenchymal metastatic disease.  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  Mucosal thickening maxillary sinuses.   Electronically Signed   By: Chauncey Cruel M.D.   On: 04/10/2014 08:16   US Biopsy  04/09/2014   CLINICAL DATA:  Hepatic metastases, known large right breast mass with axillary lymph nodes, clinical diagnosis is suspected metastatic breast cancer  EXAM:  ULTRASOUND GUIDED CORE BIOPSY OF LEFT LIVER MASS  MEDICATIONS: 1 mg IV Versed; 100 mcg IV Fentanyl  Total Moderate Sedation Time: 10  PROCEDURE: The procedure, risks, benefits, and alternatives were explained to the patient. Questions regarding the procedure were encouraged and answered. The patient understands and consents to the procedure.  The SUBXIPHOID REGION was prepped  with BETADINE in a sterile fashion, and a sterile drape was applied covering the operative field. A sterile gown and sterile gloves were used for the procedure. Local anesthesia was provided with 1% Lidocaine.  Previous imaging reviewed. Large left hepatic mass is well visualized from a subxiphoid approach. Under sterile conditions and local anesthesia, a 17 gauge 6.8 cm access needle was advanced into the left liver mass with direct ultrasound guidance. Needle position confirmed. 3 18 gauge core biopsies obtained. Samples were placed in formalin. No immediate complication. Needle tract embolized with Gelfoam pledgets. Postprocedure imaging demonstrates no hemorrhage or hematoma. Patient tolerated the biopsy well.  COMPLICATIONS: None.  FINDINGS: Imaging confirms needle placement in the large left hepatic mass within the lateral segment.  IMPRESSION: Successful ultrasound left liver mass 18 gauge core biopsy   Electronically Signed   By: Daryll Brod M.D.   On: 04/09/2014 12:21    Medications: . clindamycin (CLEOCIN) IV  600 mg Intravenous 3 times per day  . escitalopram  20 mg Oral Daily  . lactose free nutrition  237 mL Oral BID BM    Assessment/Plan 1. Right breast cellulitis ?abscess hospitalized on 04/06/14  2. Possible breast cancer, with right upper outer quadrant mass getting larger over the last 4 years, CT scan showing possible metastasis to bone, liver and lungs.  3. Hx of Migraine  4. Hx of gastric ulcer  5. Hypertension  6. Hx of depression  7. Anemia 8. SCD for DVT  Plan:  I took the current 4 x 4 dressing down and replaced it with some petroleum and hydrophilic dressing.  I will get them to place xeroform over the mass to help protect the skin.   LOS: 5 days    Earnstine Regal 04/10/2014

## 2014-04-10 NOTE — Progress Notes (Signed)
Discussed with PA Await path results  Mary [name]. Redmond Pulling, MD, FACS General, Bariatric, & Minimally Invasive Surgery [facility], [place]

## 2014-04-10 NOTE — Discharge Summary (Signed)
Physician Discharge Summary  Mary [name] [id number] DOB: 09-28-1956 DOA: 04/05/2014  PCP: [name], MD  Admit date: 04/05/2014 Discharge date: 04/10/2014  Time spent: 45 minutes  Recommendations for Outpatient Follow-up:  -Will be discharged home today. -Should follow up with Dr. Jana Hakim at the [facility] in 1 week for pathology report and plan of care.   Discharge Diagnoses:  Principal Problem:   Sepsis Active Problems:   Hypertension   Fever, unspecified   Cellulitis   Migraine headache   Elevated liver enzymes   Protein-calorie malnutrition, severe   Discharge Condition: Stable and improved  Filed Weights   04/05/14 1320  Weight: 76.658 kg (169 lb)    History of present illness:  Mary [name] is a 58 y.o. female has a past medical history significant for HTN, Migraines presents to the ED from her PCP office where she was febrile, lightheaded and had a near syncopal episode. She is complaining of a left breast redness that has appeared on Monday and has progressed since. She has a chronic left breast ulceration that has been there for a year or more, previously worked up in [place] and she was told it was nothing. Her ulceration has now started to have a drainage with the development of her redness. Endorses fevers at home and weakness. Patient denies chest pain, breathing difficulties. She has no GI or GU complaints. She has no history of DM. In the ED, patient normotensive but febrile, tachycardic and with Leukocytosis. General surgery has been consulted by EDP for evaluation and [facility] asked to admit.    Hospital Course:   Cellulitis of Right Breast  -Continued improvement with IV abx.  -Will transition to PO clindamycin for 7 more days.  Right Breast Mass  -Staging CT scans done with evidence of metastatic disease.  -s/p liver biopsy 4/13.  -Has been seen by oncology with plans for OP follow up for discussion of pathology reports and plan of  care.  Sepsis  -Due to above.  -Sepsis parameters resolved.   Mild Transaminitis  -Due to metastatic disease.   Procedures:  Liver Biopsy 4.13.   Consultations:  IR  Oncology  Surgery  Discharge Instructions  Discharge Orders   Future Appointments Provider Department Dept Phone   04/12/2014 4:45 PM Biagio Borg, MD [place] [phone]   Future Orders Complete By Expires   Discontinue IV  As directed    Increase activity slowly  As directed        Medication List    STOP taking these medications       aspirin 81 MG EC tablet     levofloxacin 500 MG tablet  Commonly known as:  LEVAQUIN     naproxen 500 MG tablet  Commonly known as:  NAPROSYN      TAKE these medications       amLODipine 5 MG tablet  Commonly known as:  NORVASC  Take 1 tablet (5 mg total) by mouth daily.     clindamycin 300 MG capsule  Commonly known as:  CLEOCIN  Take 2 capsules (600 mg total) by mouth 3 (three) times daily.     escitalopram 20 MG tablet  Commonly known as:  LEXAPRO  Take 1 tablet (20 mg total) by mouth daily.     lisinopril 40 MG tablet  Commonly known as:  PRINIVIL,ZESTRIL  Take 1 tablet (40 mg total) by mouth daily.     oxyCODONE 5 MG immediate release tablet  Commonly known as:  Oxy IR/ROXICODONE  Take 1 tablet (5 mg total) by mouth every 6 (six) hours as needed for severe pain.     SUMAtriptan 100 MG tablet  Commonly known as:  IMITREX  Take 1 tablet (100 mg total) by mouth every 2 (two) hours as needed for migraine.       Allergies  Allergen Reactions  . Hydrocodone     "makes me crawl on the floor"  . Penicillins     swelling       Follow-up Information   Follow up with Chauncey Cruel, MD. Call in 3 days. (for appointment)    Specialty:  Oncology   Contact information:   [place] [id number] [phone]        The results of significant diagnostics from this hospitalization  (including imaging, microbiology, ancillary and laboratory) are listed below for reference.    Significant Diagnostic Studies: Dg Thoracic Spine 2 View  04/05/2014   CLINICAL DATA:  Back pain  EXAM: THORACIC SPINE - 2 VIEW  COMPARISON:  CXR 07/04/2013  FINDINGS: There is no evidence of thoracic spine fracture. Alignment is normal. No other significant bone abnormalities are identified.  IMPRESSION: Negative.   Electronically Signed   By: Franchot Gallo M.D.   On: 04/05/2014 14:20   Ct Chest W Contrast  04/08/2014   CLINICAL DATA:  Recent diagnosis of breast cancer, initial staging.  EXAM: CT CHEST, ABDOMEN, AND PELVIS WITH CONTRAST  TECHNIQUE: Multidetector CT imaging of the chest, abdomen and pelvis was performed following the standard protocol during bolus administration of intravenous contrast.  CONTRAST:  1 OMNIPAQUE IOHEXOL 300 MG/ML SOLN, 133mL OMNIPAQUE IOHEXOL 300 MG/ML SOLN  COMPARISON:  None.  FINDINGS: CT CHEST FINDINGS  Mild asymmetric fullness of the left thyroid lobe may suggest underlying nodule, with punctate calcification identified image 10. Great vessels are normal in caliber. Heart size is normal. Small, right greater than left, pleural effusions are identified. Associated compressive atelectasis is noted. No pericardial effusion.  There is a centrally necrotic right upper outer quadrant breast mass measuring 6.2 x 6.0 x 7.2 cm image 27. This is immediately contiguous with the overlying skin. No discernible fat plane is identified between the mass and the underlying pectoralis minor muscle, for example image 26. Associated right breast skin and trabecular thickening are identified. Centrally hypodense likely necrotic dominant right axillary lymphadenopathy is present, largest representative node measuring 2.6 cm in short axis diameter image 19. Retropectoral lymphadenopathy is also identified, representative 1.4 cm level 3 retropectoral node identified image 12. Several left axillary nodes  are identified with prominent cortices subjectively, but no adenopathy. AP window lymphadenopathy is identified, largest 1.6 cm image 20. Pretracheal lymphadenopathy measures 1.3 cm image 20. Right hilar lymphadenopathy measures 1.1 cm image 26. Epicardial lymphadenopathy measures 0.8 cm image 42.  Innumerable oval and round pulmonary masses are identified throughout both lungs, largest right upper lobe abutting the major fissure measuring 1.3 cm image 21.  Bone windows demonstrate multiple lytic lesions in the thoracic spine for example image 40, and sternum image 21. Multiple lytic rib lesions are also noted, including expansile left ninth lateral rib lesion with surrounding soft tissue component and pathologic fracture image 41.  CT ABDOMEN AND PELVIS FINDINGS  There are innumerable irregular hypodense hepatic masses with rim enhancement including a dominant lateral segment left hepatic lobe mass measuring at least 6.3 x 5.2 cm image 59. Several of these hepatic lesions are associated with capsular  retraction and trace perihepatic ascites is noted, for example image 55. Porta hepatis node versus exophytic hepatic mass measures 1.9 cm image 60. Peritoneal nodules are also identified, for example right upper quadrant measuring 0.8 cm image 69.  Multiple hypodense splenic lesions are identified, largest 1.1 cm image 59. Statistically these are most likely cysts or lymphangiomas, less likely hemangiomas, although metastatic disease is within the differential diagnosis.  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 No hydroureteronephrosis. Adrenal glands, pancreas, and collapse gallbladder are unremarkable. Ascites tracks to the pelvis, overall small in volume. No bowel wall thickening or focal segmental dilatation is identified. The appendix is normal. Bladder is normal.  In the left adnexa, there is nodularity measuring 2.7 cm image 101. The patient is status post hysterectomy.  Right ovary is likely visualized image 96. The nodularity in the left adnexa may represent the left ovary although metastatic disease is not excluded as ovarian remnant tissue is thought to be more likely located along the left pelvic sidewall image 97. Small fat containing left inguinal hernia.  Mild disc degenerative change noted. L4 and L5 and L5-S1 osteoarthritic facet change noted. Lytic sacral lesion identified for example image 91. There are also ill-defined lytic lesions elsewhere in the bony pelvis for example sagittal reformatted image 108 representative lesion. No compression deformity.  IMPRESSION: Dominant right upper outer quadrant breast mass with findings overall suggestive of stage IV metastatic disease involving lungs, liver, and bone as above. Multi station lymphadenopathy is also described as above. If the target for biopsy is needed, the dominant right upper outer quadrant breast mass and liver masses would likely be most amenable to percutaneous sampling.  Pathologic left lateral ninth rib fracture. This is associated with the lytic presumably metastatic lesion with soft tissue component as described above.  Left adnexal mass versus remnant ovary status post hysterectomy. This is amenable to further characterization and follow-up at presumed future restaging exams as clinically needed.   Electronically Signed   By: Conchita Paris M.D.   On: 04/08/2014 11:37   Mr Jeri Cos WE Contrast  04/10/2014   CLINICAL DATA:  Recent diagnosis of stage IV breast cancer. New onset of dizziness and headaches. Evaluate for possibility of intracranial metastatic disease. History of high blood pressure and migraine headaches.  EXAM: MRI HEAD WITHOUT AND WITH CONTRAST  TECHNIQUE: Multiplanar, multiecho pulse sequences of the brain and surrounding structures were obtained without and with intravenous contrast.  CONTRAST:  23mL MULTIHANCE GADOBENATE DIMEGLUMINE 529 MG/ML IV SOLN  COMPARISON:  No comparison brain  MR or head CT. Comparison CT scan of the chest, abdomen and pelvis 04/08/2014.  FINDINGS: No acute infarct.  Several small calvarial metastatic lesions (best appreciated on diffusion sequence).  Heterogeneous appearance of the bone marrow of the clivus/ skullbase and upper cervical spine without discrete bony destructive lesion.  No parenchymal enhancing lesion to suggest the presence of parenchymal metastatic disease.  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  No intracranial hemorrhage.  No hydrocephalus.  Major intracranial vascular structures are patent.  Cervical medullary junction, pituitary region, pineal region and orbital structures unremarkable.  Mucosal thickening maxillary sinuses.  IMPRESSION: Several small calvarial metastatic lesions.  No parenchymal enhancing lesion to suggest the presence of brain parenchymal metastatic disease.  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  Mucosal thickening maxillary sinuses.   Electronically Signed   By: Chauncey Cruel M.D.   On: 04/10/2014 08:16   [place] Chest  04/06/2014  CLINICAL DATA:  Patient admitted for right breast cellulitis. Per the admitting PA, the patient has peau d'orange of the right breast and erythema. The PA states that there is a palpable abnormality at 11 o'clock, 14 cm from the nipple.  EXAM: ULTRASOUND OF THE RIGHT BREAST  COMPARISON:  None  FINDINGS: Physical exam could not be performed since the ultrasound was done off site due to the patient being admitted.  Targeted ultrasound at 11 o'clock, 14 cm from the nipple demonstrates a large mass with internal vascularity. This measures at least 7 x 6.3 x 5.1 cm. Due to the large size, exact measurements are difficult. Foci of increased echogenicity are noted within the mass, suggestive of calcifications.  IMPRESSION: Suspicious right breast mass.  RECOMMENDATION: A bilateral diagnostic ultrasound and possibly repeat right breast ultrasound is recommended.  Following this, the patient will need an ultrasound-guided core needle biopsy of the right breast mass imaged today at 11 o'clock. Further recommendations will be made following complete workup.  Results and recommendations were discussed with PA Megan Dort at 4:20 p.m. on 04/06/2014.  BI-RADS CATEGORY  0: Incomplete. Need additional imaging evaluation and/or prior mammograms for comparison.   Electronically Signed   By: Donavan Burnet M.D.   On: 04/06/2014 16:27   Ct Abdomen Pelvis W Contrast  04/08/2014   CLINICAL DATA:  Recent diagnosis of breast cancer, initial staging.  EXAM: CT CHEST, ABDOMEN, AND PELVIS WITH CONTRAST  TECHNIQUE: Multidetector CT imaging of the chest, abdomen and pelvis was performed following the standard protocol during bolus administration of intravenous contrast.  CONTRAST:  1 OMNIPAQUE IOHEXOL 300 MG/ML SOLN, 1104mL OMNIPAQUE IOHEXOL 300 MG/ML SOLN  COMPARISON:  None.  FINDINGS: CT CHEST FINDINGS  Mild asymmetric fullness of the left thyroid lobe may suggest underlying nodule, with punctate calcification identified image 10. Great vessels are normal in caliber. Heart size is normal. Small, right greater than left, pleural effusions are identified. Associated compressive atelectasis is noted. No pericardial effusion.  There is a centrally necrotic right upper outer quadrant breast mass measuring 6.2 x 6.0 x 7.2 cm image 27. This is immediately contiguous with the overlying skin. No discernible fat plane is identified between the mass and the underlying pectoralis minor muscle, for example image 26. Associated right breast skin and trabecular thickening are identified. Centrally hypodense likely necrotic dominant right axillary lymphadenopathy is present, largest representative node measuring 2.6 cm in short axis diameter image 19. Retropectoral lymphadenopathy is also identified, representative 1.4 cm level 3 retropectoral node identified image 12. Several left axillary nodes are  identified with prominent cortices subjectively, but no adenopathy. AP window lymphadenopathy is identified, largest 1.6 cm image 20. Pretracheal lymphadenopathy measures 1.3 cm image 20. Right hilar lymphadenopathy measures 1.1 cm image 26. Epicardial lymphadenopathy measures 0.8 cm image 42.  Innumerable oval and round pulmonary masses are identified throughout both lungs, largest right upper lobe abutting the major fissure measuring 1.3 cm image 21.  Bone windows demonstrate multiple lytic lesions in the thoracic spine for example image 40, and sternum image 21. Multiple lytic rib lesions are also noted, including expansile left ninth lateral rib lesion with surrounding soft tissue component and pathologic fracture image 41.  CT ABDOMEN AND PELVIS FINDINGS  There are innumerable irregular hypodense hepatic masses with rim enhancement including a dominant lateral segment left hepatic lobe mass measuring at least 6.3 x 5.2 cm image 59. Several of these hepatic lesions are associated with capsular retraction and trace perihepatic ascites is noted,  for example image 55. Porta hepatis node versus exophytic hepatic mass measures 1.9 cm image 60. Peritoneal nodules are also identified, for example right upper quadrant measuring 0.8 cm image 69.  Multiple hypodense splenic lesions are identified, largest 1.1 cm image 59. Statistically these are most likely cysts or lymphangiomas, less likely hemangiomas, although metastatic disease is within the differential diagnosis.  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 No hydroureteronephrosis. Adrenal glands, pancreas, and collapse gallbladder are unremarkable. Ascites tracks to the pelvis, overall small in volume. No bowel wall thickening or focal segmental dilatation is identified. The appendix is normal. Bladder is normal.  In the left adnexa, there is nodularity measuring 2.7 cm image 101. The patient is status post hysterectomy. Right  ovary is likely visualized image 96. The nodularity in the left adnexa may represent the left ovary although metastatic disease is not excluded as ovarian remnant tissue is thought to be more likely located along the left pelvic sidewall image 97. Small fat containing left inguinal hernia.  Mild disc degenerative change noted. L4 and L5 and L5-S1 osteoarthritic facet change noted. Lytic sacral lesion identified for example image 91. There are also ill-defined lytic lesions elsewhere in the bony pelvis for example sagittal reformatted image 108 representative lesion. No compression deformity.  IMPRESSION: Dominant right upper outer quadrant breast mass with findings overall suggestive of stage IV metastatic disease involving lungs, liver, and bone as above. Multi station lymphadenopathy is also described as above. If the target for biopsy is needed, the dominant right upper outer quadrant breast mass and liver masses would likely be most amenable to percutaneous sampling.  Pathologic left lateral ninth rib fracture. This is associated with the lytic presumably metastatic lesion with soft tissue component as described above.  Left adnexal mass versus remnant ovary status post hysterectomy. This is amenable to further characterization and follow-up at presumed future restaging exams as clinically needed.   Electronically Signed   By: Conchita Paris M.D.   On: 04/08/2014 11:37   US Biopsy  04/09/2014   CLINICAL DATA:  Hepatic metastases, known large right breast mass with axillary lymph nodes, clinical diagnosis is suspected metastatic breast cancer  EXAM: ULTRASOUND GUIDED CORE BIOPSY OF LEFT LIVER MASS  MEDICATIONS: 1 mg IV Versed; 100 mcg IV Fentanyl  Total Moderate Sedation Time: 10  PROCEDURE: The procedure, risks, benefits, and alternatives were explained to the patient. Questions regarding the procedure were encouraged and answered. The patient understands and consents to the procedure.  The SUBXIPHOID REGION  was prepped with BETADINE in a sterile fashion, and a sterile drape was applied covering the operative field. A sterile gown and sterile gloves were used for the procedure. Local anesthesia was provided with 1% Lidocaine.  Previous imaging reviewed. Large left hepatic mass is well visualized from a subxiphoid approach. Under sterile conditions and local anesthesia, a 17 gauge 6.8 cm access needle was advanced into the left liver mass with direct ultrasound guidance. Needle position confirmed. 3 18 gauge core biopsies obtained. Samples were placed in formalin. No immediate complication. Needle tract embolized with Gelfoam pledgets. Postprocedure imaging demonstrates no hemorrhage or hematoma. Patient tolerated the biopsy well.  COMPLICATIONS: None.  FINDINGS: Imaging confirms needle placement in the large left hepatic mass within the lateral segment.  IMPRESSION: Successful ultrasound left liver mass 18 gauge core biopsy   Electronically Signed   By: Daryll Brod M.D.   On: 04/09/2014 12:21   US Abdomen Limited Ruq  04/07/2014   CLINICAL DATA:  Elevated hepatic function studies.  EXAM: US ABDOMEN LIMITED - RIGHT UPPER QUADRANT  COMPARISON:  None.  FINDINGS: Gallbladder:  The gallbladder is adequately distended. No calcified stones are evident. . There is no wall thickening or pericholecystic fluid or positive sonographic Murphy's sign.  Common bile duct:  Diameter: 4 mm  Liver:  There are multiple hypoechoic masses with central areas of increased echogenicity throughout the liver. The largest demonstrated is 5.3 x 3.8 x 5.1 cm and lies in the left lobe. There is mild intrahepatic ductal dilation.  IMPRESSION: 1. There is an abnormal appearance of the liver worrisome for multiple masses likely metastatic foci. There is intrahepatic ductal dilation. Contrast-enhanced CT scanning of the abdomen and pelvis is recommended. 2. No definite gallstones are present but there may be echogenic bile or sludge. There is no  sonographic evidence of acute cholecystitis.   Electronically Signed   By: David  Martinique   On: 04/07/2014 10:27    Microbiology: Recent Results (from the past 240 hour(s))  URINE CULTURE     Status: None   Collection Time    04/05/14  3:39 PM      Result Value Ref Range Status   Specimen Description URINE, CLEAN CATCH   Final   Special Requests NONE   Final   Culture  Setup Time     Final   Value: 04/05/2014 22:59     Performed at [facility]     Final   Value: >=100,000 COLONIES/ML     Performed at [facility]   Culture     Final   Value: Multiple bacterial morphotypes present, none predominant. Suggest appropriate recollection if clinically indicated.     Performed at [facility]   Report Status 04/06/2014 FINAL   Final  CULTURE, BLOOD (ROUTINE X 2)     Status: None   Collection Time    04/05/14  5:30 PM      Result Value Ref Range Status   Specimen Description BLOOD LEFT HAND   Final   Special Requests BOTTLES DRAWN AEROBIC AND ANAEROBIC 4CC   Final   Culture  Setup Time     Final   Value: 04/05/2014 22:25     Performed at [facility]   Culture     Final   Value:        BLOOD CULTURE RECEIVED NO GROWTH TO DATE CULTURE WILL BE HELD FOR 5 DAYS BEFORE ISSUING A FINAL NEGATIVE REPORT     Performed at [facility]   Report Status PENDING   Incomplete  CULTURE, BLOOD (ROUTINE X 2)     Status: None   Collection Time    04/05/14  5:34 PM      Result Value Ref Range Status   Specimen Description BLOOD RIGHT ANTECUBITAL   Final   Special Requests BOTTLES DRAWN AEROBIC AND ANAEROBIC 5CC   Final   Culture  Setup Time     Final   Value: 04/05/2014 22:25     Performed at [facility]   Culture     Final   Value:        BLOOD CULTURE RECEIVED NO GROWTH TO DATE CULTURE WILL BE HELD FOR 5 DAYS BEFORE ISSUING A FINAL NEGATIVE REPORT     Performed at [facility]   Report Status PENDING   Incomplete      Labs: Basic Metabolic Panel:  Recent  Labs Lab 04/05/14 1410 04/06/14 0540 04/07/14 0630 04/09/14 0450  NA 134* 136* 137 137  K 4.3 3.7 3.6* 3.5*  CL 97 102 102 101  CO2 22 19 22 23   GLUCOSE 102* 96 103* 98  BUN 17 13 9  5*  CREATININE 0.94 0.68 0.67 0.59  CALCIUM 9.7 9.0 9.3 9.3  MG  --   --   --  1.9   Liver Function Tests:  Recent Labs Lab 04/05/14 1410 04/06/14 0540 04/07/14 0630  AST 85* 85* 74*  ALT 44* 37* 34  ALKPHOS 170* 161* 179*  BILITOT 1.2 1.2 0.7  PROT 7.4 6.5 6.7  ALBUMIN 2.9* 2.5* 2.4*   No results found for this basename: LIPASE, AMYLASE,  in the last 168 hours No results found for this basename: AMMONIA,  in the last 168 hours CBC:  Recent Labs Lab 04/05/14 1410 04/06/14 0540 04/07/14 0630 04/09/14 0450  WBC 16.7* 14.8* 13.1* 10.6*  NEUTROABS 13.1*  --   --   --   HGB 9.5* 8.4* 7.8* 8.2*  HCT 27.8* 24.0* 23.6* 24.7*  MCV 90.0 87.9 89.7 89.5  PLT 343 337 398 541*   Cardiac Enzymes: No results found for this basename: CKTOTAL, CKMB, CKMBINDEX, TROPONINI,  in the last 168 hours BNP: BNP (last 3 results) No results found for this basename: PROBNP,  in the last 8760 hours CBG:  Recent Labs Lab 04/05/14 1634  GLUCAP 92       Signed:  Erline Hau  [facility] Pager: [phone] 04/10/2014, 6:01 PM

## 2014-04-11 ENCOUNTER — Telehealth: Payer: Self-pay | Admitting: Hematology & Oncology

## 2014-04-11 ENCOUNTER — Other Ambulatory Visit: Payer: Self-pay | Admitting: Oncology

## 2014-04-11 LAB — CULTURE, BLOOD (ROUTINE X 2)
CULTURE: NO GROWTH
Culture: NO GROWTH

## 2014-04-11 NOTE — Telephone Encounter (Signed)
Pt aware of 4-16 appointment

## 2014-04-11 NOTE — Telephone Encounter (Signed)
Mary [name] and I both spoke w NEW PATIENT today to remind them of their appointment with [name]. Also, advised them to bring all meds and insurance information.

## 2014-04-12 ENCOUNTER — Telehealth: Payer: Self-pay | Admitting: Hematology & Oncology

## 2014-04-12 ENCOUNTER — Ambulatory Visit: Payer: Self-pay | Admitting: Internal Medicine

## 2014-04-12 ENCOUNTER — Ambulatory Visit: Payer: BC Managed Care – PPO

## 2014-04-12 ENCOUNTER — Encounter: Payer: Self-pay | Admitting: Hematology & Oncology

## 2014-04-12 ENCOUNTER — Other Ambulatory Visit (HOSPITAL_BASED_OUTPATIENT_CLINIC_OR_DEPARTMENT_OTHER): Payer: BC Managed Care – PPO | Admitting: Lab

## 2014-04-12 ENCOUNTER — Ambulatory Visit (HOSPITAL_BASED_OUTPATIENT_CLINIC_OR_DEPARTMENT_OTHER): Payer: BC Managed Care – PPO | Admitting: Hematology & Oncology

## 2014-04-12 VITALS — BP 161/94 | HR 104 | Temp 98.6°F | Resp 14 | Ht 63.0 in | Wt 170.0 lb

## 2014-04-12 DIAGNOSIS — C50919 Malignant neoplasm of unspecified site of unspecified female breast: Secondary | ICD-10-CM | POA: Diagnosis not present

## 2014-04-12 DIAGNOSIS — C787 Secondary malignant neoplasm of liver and intrahepatic bile duct: Principal | ICD-10-CM

## 2014-04-12 DIAGNOSIS — C7800 Secondary malignant neoplasm of unspecified lung: Secondary | ICD-10-CM

## 2014-04-12 DIAGNOSIS — C7951 Secondary malignant neoplasm of bone: Secondary | ICD-10-CM

## 2014-04-12 DIAGNOSIS — C7952 Secondary malignant neoplasm of bone marrow: Secondary | ICD-10-CM

## 2014-04-12 DIAGNOSIS — I10 Essential (primary) hypertension: Secondary | ICD-10-CM

## 2014-04-12 HISTORY — DX: Malignant neoplasm of unspecified site of unspecified female breast: C50.919

## 2014-04-12 LAB — CMP (CANCER CENTER ONLY)
ALBUMIN: 3.1 g/dL — AB (ref 3.3–5.5)
ALK PHOS: 227 U/L — AB (ref 26–84)
ALT(SGPT): 41 U/L (ref 10–47)
AST: 138 U/L — ABNORMAL HIGH (ref 11–38)
BUN: 8 mg/dL (ref 7–22)
CO2: 29 meq/L (ref 18–33)
Calcium: 9.9 mg/dL (ref 8.0–10.3)
Chloride: 99 mEq/L (ref 98–108)
Creat: 0.5 mg/dl — ABNORMAL LOW (ref 0.6–1.2)
Glucose, Bld: 106 mg/dL (ref 73–118)
POTASSIUM: 3.7 meq/L (ref 3.3–4.7)
SODIUM: 140 meq/L (ref 128–145)
TOTAL PROTEIN: 8.2 g/dL — AB (ref 6.4–8.1)
Total Bilirubin: 1 mg/dl (ref 0.20–1.60)

## 2014-04-12 LAB — TECHNOLOGIST REVIEW CHCC SATELLITE

## 2014-04-12 LAB — CBC WITH DIFFERENTIAL (CANCER CENTER ONLY)
BASO#: 0 10*3/uL (ref 0.0–0.2)
BASO%: 0.3 % (ref 0.0–2.0)
EOS ABS: 0.4 10*3/uL (ref 0.0–0.5)
EOS%: 3.2 % (ref 0.0–7.0)
HEMATOCRIT: 28.6 % — AB (ref 34.8–46.6)
HEMOGLOBIN: 9.5 g/dL — AB (ref 11.6–15.9)
LYMPH#: 1.7 10*3/uL (ref 0.9–3.3)
LYMPH%: 14.9 % (ref 14.0–48.0)
MCH: 30.7 pg (ref 26.0–34.0)
MCHC: 33.2 g/dL (ref 32.0–36.0)
MCV: 93 fL (ref 81–101)
MONO#: 1 10*3/uL — AB (ref 0.1–0.9)
MONO%: 8.6 % (ref 0.0–13.0)
NEUT#: 8.4 10*3/uL — ABNORMAL HIGH (ref 1.5–6.5)
NEUT%: 73 % (ref 39.6–80.0)
Platelets: 744 10*3/uL — ABNORMAL HIGH (ref 145–400)
RBC: 3.09 10*6/uL — ABNORMAL LOW (ref 3.70–5.32)
RDW: 15.5 % (ref 11.1–15.7)
WBC: 11.5 10*3/uL — AB (ref 3.9–10.0)

## 2014-04-12 LAB — CANCER ANTIGEN 27.29: CA 27.29: 2587 U/mL — ABNORMAL HIGH (ref 0–39)

## 2014-04-12 LAB — LACTATE DEHYDROGENASE: LDH: 1303 U/L — ABNORMAL HIGH (ref 94–250)

## 2014-04-12 MED ORDER — PROCHLORPERAZINE MALEATE 10 MG PO TABS: 10.0000 mg | ORAL_TABLET | Freq: Four times a day (QID) | ORAL | Status: DC | PRN

## 2014-04-12 MED ORDER — LORAZEPAM 0.5 MG PO TABS: 0.5000 mg | ORAL_TABLET | Freq: Four times a day (QID) | ORAL | Status: DC | PRN

## 2014-04-12 MED ORDER — DEXAMETHASONE 4 MG PO TABS: 8.0000 mg | ORAL_TABLET | Freq: Two times a day (BID) | ORAL | Status: DC

## 2014-04-12 MED ORDER — ONDANSETRON HCL 8 MG PO TABS: 8.0000 mg | ORAL_TABLET | Freq: Two times a day (BID) | ORAL | Status: DC | PRN

## 2014-04-12 NOTE — Progress Notes (Addendum)
Referral MD  Reason for Referral: Metastatic breast cancer   HPI: Mary [name] is a very charming 58 year old African American female. She was recently hospitalized because of a large right breast mass. She underwent a biopsy of a liver metastases. She is taking studies which showed extensive metastasis in the lung and liver. She had a CA 27.29 of 1870.  MRI of the brain showed no intracranial metastasis. 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  She's lost some weight. Her appetite is down a little bit. Overall she still has a good performance status of ECOG 1.  There is no family history of breast cancer. There is really no history of cancer of all. She has never been pregnant. She had a hysterectomy probably 23 years ago. She had her ovaries left in place. She does not smoke. She does not drink.  There is no cough. She is tired. She's not noted any obvious bleeding. There's been no fever. She's had no headache. There's been no double vision or blurred vision.       Allergies are hydrocodone and penicillin  Chief Complaint  Patient presents with  . NEW PATIENT    Past Medical History  Diagnosis Date  . Arthritis   . Depression   . Fainting   . Headache   . Hypertension   . Migraines   . History of blood transfusion   . Gastric ulcer   . Breast cancer metastasized to multiple sites 04/12/2014  :  Past Surgical History  Procedure Laterality Date  . Abdominal hysterectomy  15 years go    partial  :  Current outpatient prescriptions:amLODipine (NORVASC) 5 MG tablet, Take 1 tablet (5 mg total) by mouth daily., Disp: 90 tablet, Rfl: 3;  clindamycin (CLEOCIN) 300 MG capsule, Take 600 mg by mouth 3 (three) times daily., Disp: , Rfl: ;  escitalopram (LEXAPRO) 20 MG tablet, Take 1 tablet (20 mg total) by mouth daily., Disp: 90 tablet, Rfl: 3 lisinopril (PRINIVIL,ZESTRIL) 40 MG tablet, Take 1 tablet (40 mg total) by mouth daily., Disp: 90 tablet, Rfl: 3;  meloxicam (MOBIC) 7.5 MG  tablet, Take 7.5 mg by mouth as needed for pain., Disp: , Rfl: ;  oxyCODONE (OXY IR/ROXICODONE) 5 MG immediate release tablet, Take 1 tablet (5 mg total) by mouth every 6 (six) hours as needed for severe pain., Disp: 30 tablet, Rfl: 0 SUMAtriptan (IMITREX) 100 MG tablet, Take 1 tablet (100 mg total) by mouth every 2 (two) hours as needed for migraine., Disp: 10 tablet, Rfl: 3;  dexamethasone (DECADRON) 4 MG tablet, Take 2 tablets (8 mg total) by mouth 2 (two) times daily. Start the day before Taxotere. Take once the day after, then 2 times a day x 2d., Disp: 60 tablet, Rfl: 1 LORazepam (ATIVAN) 0.5 MG tablet, Take 1 tablet (0.5 mg total) by mouth every 6 (six) hours as needed (Nausea or vomiting)., Disp: 60 tablet, Rfl: 2;  ondansetron (ZOFRAN) 8 MG tablet, Take 1 tablet (8 mg total) by mouth 2 (two) times daily as needed (Nausea or vomiting). Begin 4 days after chemotherapy., Disp: 30 tablet, Rfl: 4 prochlorperazine (COMPAZINE) 10 MG tablet, Take 1 tablet (10 mg total) by mouth every 6 (six) hours as needed (Nausea or vomiting)., Disp: 60 tablet, Rfl: 3:  :  Allergies  Allergen Reactions  . Hydrocodone     "makes me crawl on the floor"  . Penicillins     swelling  :  Family History  Problem Relation Age of  Onset  . Arthritis Other   . Hypertension Other   . Hypertension Other   . Heart disease Other   . Stroke Other   :  History   Social History  . Marital Status: Married    Spouse Name: N/A    Number of Children: N/A  . Years of Education: N/A   Occupational History  . Not on file.   Social History Main Topics  . Smoking status: Never Smoker   . Smokeless tobacco: Never Used     Comment: never used tobacco  . Alcohol Use: No  . Drug Use: No  . Sexual Activity: Not Currently   Other Topics Concern  . Not on file   Social History Narrative  . No narrative on file  :  Pertinent items are noted in HPI.  Exam: _0 @ Well-developed well-nourished Afro-American  female. Her vital signs show a temperature of 98.6. Pulse 104. Blood pressure 161/94. Weight is 170 pounds. Head and neck exam shows no ocular or oral lesions. Shows no palpable cervical or supraclavicular lymph nodes. Lungs are clear. Cardiac exam tachycardia regular. Breast exam shows large right breast. There is erythema on the right breast skin. She has a large firm mass in the right upper quadrant. She is in dressing over this. She has palpable right axillary nodes. Left breast is without masses. Is no left axillary adenopathy. Abdomen soft. She has no palpable liver edge. There is no fluid wave. There is no palpable spleen. Back exam no tenderness over the spine ribs or hips. Extremities no clubbing cyanosis or edema. There is no lymphedema in the right arm. Has good range of motion of her joints. Skin exam shows a erythema over the right breast. Neurological exam shows no focal neurological deficits.   Recent Labs  04/12/14 0959  WBC 11.5*  HGB 9.5*  HCT 28.6*  PLT 744*    Recent Labs  04/12/14 0959  NA 140  K 3.7  CL 99  CO2 29  GLUCOSE 106  BUN 8  CREATININE 0.5*  CALCIUM 9.9    Blood smear review: None  Pathology:    Assessment and Plan: Mary [name] is very charming 58 year old African American female. She has metastatic breast cancer. The primary really is the right breast.  She does have a good performance status (ECOG 1). As such, we can be aggressive with therapy.  To me, does not matter right now whether she is ER positive or negative. What does matter is whether or not she is HER-2 positive. This will really dictate our therapy. She clearly needs chemotherapy. I think the choice will be dependent upon the HER-2 status.  She will need to have a Port-A-Cath placed. She will also need to have a MUGA scan.  She knows that we can treat this but that she is not curable.  I noted that when she was in the hospital, her CA 27.29 was about 1900.  I think that she  could tolerate triple therapy so we can get a fairly good response. I think that Cytoxan/Adriamycin/Taxotere would be reasonable. If she is HER-2 positive, then I will omit the Adriamycin. If she is HER-2 negative and ER negative, then I probably would utilize carboplatinum with Taxotere.   I spent a good hour and a half with her and her husband. She is very nice. She does understand that she is not curable. We do not talk about prognosis with respect to length of time.  I want to try  to get started on treatment on April 27 if possible.  I would give her 2 or 3 cycles of chemotherapy and then we will reassess her.  We do not need to have a PET scan.  She will also need Zometa.  I suspect that she is iron deficient from this breast mass. It is possible she also may have a bone marrow involvement. I will have to review her blood smear.  I will plan to get her back to see me the day of her second cycle of chemotherapy.  I went over the side effects and complications of chemotherapy at length with her and her husband. We gave her information sheets about these.    I did get the prognostic markers. She is ER positive and HER-2 positive.  She is PR negative. As such, we will go ahead and give her Taxotere/Cytoxan with Herceptin and Perjeta. We will get the MUGA scan on her. This I think is more favorable for her.

## 2014-04-12 NOTE — Telephone Encounter (Signed)
Pt aware of all appointments as listed above. She is aware to be NPO after 7 am for port placement and to have a driver

## 2014-04-12 NOTE — Telephone Encounter (Signed)
Pt in ofc today as a NEW PATIENT w no insurance. Pt did advise, she has [employer] pending, but no card at this time. I gave pt a  [place] ([employer]) application to complete in office. However, pt said she was not up to filling out the application in house and wanted to take it home to complete and return. I advised pt to return application back to [facility] [place] on her radiation appt set for 4-23 w Dr. Pablo Ledger.   Pt and her family seemed to be stressed, upset and crying about her new dx (breast cancer). So, I also told pt to see an onsite SW at [facility], also.

## 2014-04-12 NOTE — Patient Instructions (Signed)
Doxorubicin injection What is this medicine? DOXORUBICIN (dox oh ROO bi sin) is a chemotherapy drug. It is used to treat many kinds of cancer like Hodgkin's disease, leukemia, non-Hodgkin's lymphoma, neuroblastoma, sarcoma, and Wilms' tumor. It is also used to treat bladder cancer, breast cancer, lung cancer, ovarian cancer, stomach cancer, and thyroid cancer. This medicine may be used for other purposes; ask your health care provider or pharmacist if you have questions. COMMON BRAND NAME(S): Adriamycin PFS, Adriamycin RDF, Adriamycin, Rubex What should I tell my health care provider before I take this medicine? They need to know if you have any of these conditions: -blood disorders -heart disease, recent heart attack -infection (especially a virus infection such as chickenpox, cold sores, or herpes) -irregular heartbeat -liver disease -recent or ongoing radiation therapy -an unusual or allergic reaction to doxorubicin, other chemotherapy agents, other medicines, foods, dyes, or preservatives -pregnant or trying to get pregnant -breast-feeding How should I use this medicine? This drug is given as an infusion into a vein. It is administered in a hospital or clinic by a specially trained health care professional. If you have pain, swelling, burning or any unusual feeling around the site of your injection, tell your health care professional right away. Talk to your pediatrician regarding the use of this medicine in children. Special care may be needed. Overdosage: If you think you have taken too much of this medicine contact a poison control center or emergency room at once. NOTE: This medicine is only for you. Do not share this medicine with others. What if I miss a dose? It is important not to miss your dose. Call your doctor or health care professional if you are unable to keep an appointment. What may interact with this medicine? Do not take this medicine with any of the following  medications: -cisapride -droperidol -halofantrine -pimozide -zidovudine This medicine may also interact with the following medications: -chloroquine -chlorpromazine -clarithromycin -cyclophosphamide -cyclosporine -erythromycin -medicines for depression, anxiety, or psychotic disturbances -medicines for irregular heart beat like amiodarone, bepridil, dofetilide, encainide, flecainide, propafenone, quinidine -medicines for seizures like ethotoin, fosphenytoin, phenytoin -medicines for nausea, vomiting like dolasetron, ondansetron, palonosetron -medicines to increase blood counts like filgrastim, pegfilgrastim, sargramostim -methadone -methotrexate -pentamidine -progesterone -vaccines -verapamil Talk to your doctor or health care professional before taking any of these medicines: -acetaminophen -aspirin -ibuprofen -ketoprofen -naproxen This list may not describe all possible interactions. Give your health care provider a list of all the medicines, herbs, non-prescription drugs, or dietary supplements you use. Also tell them if you smoke, drink alcohol, or use illegal drugs. Some items may interact with your medicine. What should I watch for while using this medicine? Your condition will be monitored carefully while you are receiving this medicine. You will need important blood work done while you are taking this medicine. This drug may make you feel generally unwell. This is not uncommon, as chemotherapy can affect healthy cells as well as cancer cells. Report any side effects. Continue your course of treatment even though you feel ill unless your doctor tells you to stop. Your urine may turn red for a few days after your dose. This is not blood. If your urine is dark or brown, call your doctor. In some cases, you may be given additional medicines to help with side effects. Follow all directions for their use. Call your doctor or health care professional for advice if you get a  fever, chills or sore throat, or other symptoms of a cold or flu. Do not  treat yourself. This drug decreases your body's ability to fight infections. Try to avoid being around people who are sick. This medicine may increase your risk to bruise or bleed. Call your doctor or health care professional if you notice any unusual bleeding. Be careful brushing and flossing your teeth or using a toothpick because you may get an infection or bleed more easily. If you have any dental work done, tell your dentist you are receiving this medicine. Avoid taking products that contain aspirin, acetaminophen, ibuprofen, naproxen, or ketoprofen unless instructed by your doctor. These medicines may hide a fever. Men and women of childbearing age should use effective birth control methods while using taking this medicine. Do not become pregnant while taking this medicine. There is a potential for serious side effects to an unborn child. Talk to your health care professional or pharmacist for more information. Do not breast-feed an infant while taking this medicine. Do not let others touch your urine or other body fluids for 5 days after each treatment with this medicine. Caregivers should wear latex gloves to avoid touching body fluids during this time. There is a maximum amount of this medicine you should receive throughout your life. The amount depends on the medical condition being treated and your overall health. Your doctor will watch how much of this medicine you receive in your lifetime. Tell your doctor if you have taken this medicine before. What side effects may I notice from receiving this medicine? Side effects that you should report to your doctor or health care professional as soon as possible: -allergic reactions like skin rash, itching or hives, swelling of the face, lips, or tongue -low blood counts - this medicine may decrease the number of white blood cells, red blood cells and platelets. You may be at  increased risk for infections and bleeding. -signs of infection - fever or chills, cough, sore throat, pain or difficulty passing urine -signs of decreased platelets or bleeding - bruising, pinpoint red spots on the skin, black, tarry stools, blood in the urine -signs of decreased red blood cells - unusually weak or tired, fainting spells, lightheadedness -breathing problems -chest pain -fast, irregular heartbeat -mouth sores -nausea, vomiting -pain, swelling, redness at site where injected -pain, tingling, numbness in the hands or feet -swelling of ankles, feet, or hands -unusual bleeding or bruising Side effects that usually do not require medical attention (report to your doctor or health care professional if they continue or are bothersome): -diarrhea -facial flushing -hair loss -loss of appetite -missed menstrual periods -nail discoloration or damage -red or watery eyes -red colored urine -stomach upset This list may not describe all possible side effects. Call your doctor for medical advice about side effects. You may report side effects to FDA at 1-800-FDA-1088. Where should I keep my medicine? This drug is given in a hospital or clinic and will not be stored at home. NOTE: This sheet is a summary. It may not cover all possible information. If you have questions about this medicine, talk to your doctor, pharmacist, or health care provider.  2014, Elsevier/[employer] Standard. (2013-04-11 09:54:34)   Docetaxel injection What is this medicine? DOCETAXEL (doe se TAX el) is a chemotherapy drug. It targets fast dividing cells, like cancer cells, and causes these cells to die. This medicine is used to treat many types of cancers like breast cancer, certain stomach cancers, head and neck cancer, lung cancer, and prostate cancer. This medicine may be used for other purposes; ask your health care provider  or pharmacist if you have questions. COMMON BRAND NAME(S): Docefrez , Taxotere What  should I tell my health care provider before I take this medicine? They need to know if you have any of these conditions: -infection (especially a virus infection such as chickenpox, cold sores, or herpes) -liver disease -low blood counts, like low white cell, platelet, or red cell counts -an unusual or allergic reaction to docetaxel, polysorbate 80, other chemotherapy agents, other medicines, foods, dyes, or preservatives -pregnant or trying to get pregnant -breast-feeding How should I use this medicine? This drug is given as an infusion into a vein. It is administered in a hospital or clinic by a specially trained health care professional. Talk to your pediatrician regarding the use of this medicine in children. Special care may be needed. Overdosage: If you think you have taken too much of this medicine contact a poison control center or emergency room at once. NOTE: This medicine is only for you. Do not share this medicine with others. What if I miss a dose? It is important not to miss your dose. Call your doctor or health care professional if you are unable to keep an appointment. What may interact with this medicine? -cyclosporine -erythromycin -ketoconazole -medicines to increase blood counts like filgrastim, pegfilgrastim, sargramostim -vaccines Talk to your doctor or health care professional before taking any of these medicines: -acetaminophen -aspirin -ibuprofen -ketoprofen -naproxen This list may not describe all possible interactions. Give your health care provider a list of all the medicines, herbs, non-prescription drugs, or dietary supplements you use. Also tell them if you smoke, drink alcohol, or use illegal drugs. Some items may interact with your medicine. What should I watch for while using this medicine? Your condition will be monitored carefully while you are receiving this medicine. You will need important blood work done while you are taking this medicine. This  drug may make you feel generally unwell. This is not uncommon, as chemotherapy can affect healthy cells as well as cancer cells. Report any side effects. Continue your course of treatment even though you feel ill unless your doctor tells you to stop. In some cases, you may be given additional medicines to help with side effects. Follow all directions for their use. Call your doctor or health care professional for advice if you get a fever, chills or sore throat, or other symptoms of a cold or flu. Do not treat yourself. This drug decreases your body's ability to fight infections. Try to avoid being around people who are sick. This medicine may increase your risk to bruise or bleed. Call your doctor or health care professional if you notice any unusual bleeding. Be careful brushing and flossing your teeth or using a toothpick because you may get an infection or bleed more easily. If you have any dental work done, tell your dentist you are receiving this medicine. Avoid taking products that contain aspirin, acetaminophen, ibuprofen, naproxen, or ketoprofen unless instructed by your doctor. These medicines may hide a fever. Do not become pregnant while taking this medicine. Women should inform their doctor if they wish to become pregnant or think they might be pregnant. There is a potential for serious side effects to an unborn child. Talk to your health care professional or pharmacist for more information. Do not breast-feed an infant while taking this medicine. What side effects may I notice from receiving this medicine? Side effects that you should report to your doctor or health care professional as soon as possible: -allergic reactions like  skin rash, itching or hives, swelling of the face, lips, or tongue -low blood counts - This drug may decrease the number of white blood cells, red blood cells and platelets. You may be at increased risk for infections and bleeding. -signs of infection - fever or  chills, cough, sore throat, pain or difficulty passing urine -signs of decreased platelets or bleeding - bruising, pinpoint red spots on the skin, black, tarry stools, nosebleeds -signs of decreased red blood cells - unusually weak or tired, fainting spells, lightheadedness -breathing problems -fast or irregular heartbeat -low blood pressure -mouth sores -nausea and vomiting -pain, swelling, redness or irritation at the injection site -pain, tingling, numbness in the hands or feet -swelling of the ankle, feet, hands -weight gain Side effects that usually do not require medical attention (report to your prescriber or health care professional if they continue or are bothersome): -bone pain -complete hair loss including hair on your head, underarms, pubic hair, eyebrows, and eyelashes -diarrhea -excessive tearing -changes in the color of fingernails -loosening of the fingernails -nausea -muscle pain -red flush to skin -sweating -weak or tired This list may not describe all possible side effects. Call your doctor for medical advice about side effects. You may report side effects to FDA at 1-800-FDA-1088. Where should I keep my medicine? This drug is given in a hospital or clinic and will not be stored at home. NOTE: This sheet is a summary. It may not cover all possible information. If you have questions about this medicine, talk to your doctor, pharmacist, or health care provider.  2014, Elsevier/Gold Standard. (2008-11-26 11:52:10) Cyclophosphamide injection What is this medicine? CYCLOPHOSPHAMIDE (sye kloe FOSS fa mide) is a chemotherapy drug. It slows the growth of cancer cells. This medicine is used to treat many types of cancer like lymphoma, myeloma, leukemia, breast cancer, and ovarian cancer, to name a few. This medicine may be used for other purposes; ask your health care provider or pharmacist if you have questions. COMMON BRAND NAME(S): Cytoxan, Neosar What should I tell my  health care provider before I take this medicine? They need to know if you have any of these conditions: -blood disorders -history of other chemotherapy -infection -kidney disease -liver disease -recent or ongoing radiation therapy -tumors in the bone marrow -an unusual or allergic reaction to cyclophosphamide, other chemotherapy, other medicines, foods, dyes, or preservatives -pregnant or trying to get pregnant -breast-feeding How should I use this medicine? This drug is usually given as an injection into a vein or muscle or by infusion into a vein. It is administered in a hospital or clinic by a specially trained health care professional. Talk to your pediatrician regarding the use of this medicine in children. Special care may be needed. Overdosage: If you think you have taken too much of this medicine contact a poison control center or emergency room at once. NOTE: This medicine is only for you. Do not share this medicine with others. What if I miss a dose? It is important not to miss your dose. Call your doctor or health care professional if you are unable to keep an appointment. What may interact with this medicine? This medicine may interact with the following medications: -amiodarone -amphotericin B -azathioprine -certain antiviral medicines for HIV or AIDS such as protease inhibitors (e.g., indinavir, ritonavir) and zidovudine -certain blood pressure medications such as benazepril, captopril, enalapril, fosinopril, lisinopril, moexipril, monopril, perindopril, quinapril, ramipril, trandolapril -certain cancer medications such as anthracyclines (e.g., daunorubicin, doxorubicin), busulfan, cytarabine, paclitaxel, pentostatin, tamoxifen,  trastuzumab -certain diuretics such as chlorothiazide, chlorthalidone, hydrochlorothiazide, indapamide, metolazone -certain medicines that treat or prevent blood clots like warfarin -certain muscle relaxants such as  succinylcholine -cyclosporine -etanercept -indomethacin -medicines to increase blood counts like filgrastim, pegfilgrastim, sargramostim -medicines used as general anesthesia -metronidazole -natalizumab This list may not describe all possible interactions. Give your health care provider a list of all the medicines, herbs, non-prescription drugs, or dietary supplements you use. Also tell them if you smoke, drink alcohol, or use illegal drugs. Some items may interact with your medicine. What should I watch for while using this medicine? Visit your doctor for checks on your progress. This drug may make you feel generally unwell. This is not uncommon, as chemotherapy can affect healthy cells as well as cancer cells. Report any side effects. Continue your course of treatment even though you feel ill unless your doctor tells you to stop. Drink water or other fluids as directed. Urinate often, even at night. In some cases, you may be given additional medicines to help with side effects. Follow all directions for their use. Call your doctor or health care professional for advice if you get a fever, chills or sore throat, or other symptoms of a cold or flu. Do not treat yourself. This drug decreases your body's ability to fight infections. Try to avoid being around people who are sick. This medicine may increase your risk to bruise or bleed. Call your doctor or health care professional if you notice any unusual bleeding. Be careful brushing and flossing your teeth or using a toothpick because you may get an infection or bleed more easily. If you have any dental work done, tell your dentist you are receiving this medicine. You may get drowsy or dizzy. Do not drive, use machinery, or do anything that needs mental alertness until you know how this medicine affects you. Do not become pregnant while taking this medicine or for 1 year after stopping it. Women should inform their doctor if they wish to become  pregnant or think they might be pregnant. Men should not father a child while taking this medicine and for 4 months after stopping it. There is a potential for serious side effects to an unborn child. Talk to your health care professional or pharmacist for more information. Do not breast-feed an infant while taking this medicine. This medicine may interfere with the ability to have a child. This medicine has caused ovarian failure in some women. This medicine has caused reduced sperm counts in some men. You should talk with your doctor or health care professional if you are concerned about your fertility. If you are going to have surgery, tell your doctor or health care professional that you have taken this medicine. What side effects may I notice from receiving this medicine? Side effects that you should report to your doctor or health care professional as soon as possible: -allergic reactions like skin rash, itching or hives, swelling of the face, lips, or tongue -low blood counts - this medicine may decrease the number of white blood cells, red blood cells and platelets. You may be at increased risk for infections and bleeding. -signs of infection - fever or chills, cough, sore throat, pain or difficulty passing urine -signs of decreased platelets or bleeding - bruising, pinpoint red spots on the skin, black, tarry stools, blood in the urine -signs of decreased red blood cells - unusually weak or tired, fainting spells, lightheadedness -breathing problems -dark urine -dizziness -palpitations -swelling of the ankles, feet, hands -  trouble passing urine or change in the amount of urine -weight gain -yellowing of the eyes or skin Side effects that usually do not require medical attention (report to your doctor or health care professional if they continue or are bothersome): -changes in nail or skin color -hair loss -missed menstrual periods -mouth sores -nausea, vomiting This list may not  describe all possible side effects. Call your doctor for medical advice about side effects. You may report side effects to FDA at 1-800-FDA-1088. Where should I keep my medicine? This drug is given in a hospital or clinic and will not be stored at home. NOTE: This sheet is a summary. It may not cover all possible information. If you have questions about this medicine, talk to your doctor, pharmacist, or health care provider.  2014, [employer]vier/[employer] Standard. (2012-10-28 16:22:58)

## 2014-04-13 ENCOUNTER — Encounter (HOSPITAL_COMMUNITY): Payer: Self-pay | Admitting: Pharmacy Technician

## 2014-04-13 ENCOUNTER — Telehealth: Payer: Self-pay | Admitting: Hematology & Oncology

## 2014-04-13 NOTE — Telephone Encounter (Signed)
3 pofs entered by GM for pt. one on 4/15 for f/u 4/24 and 2 on 4/16 for imaging studies, port placement, chemo and f/u appt. pof's for 4/16 did not cross over to scheduling. pt has already been seen by PE and on schedule for tx and fu appts @ [facility] location. message to GM informing him.

## 2014-04-17 ENCOUNTER — Other Ambulatory Visit: Payer: Self-pay | Admitting: Radiology

## 2014-04-17 ENCOUNTER — Encounter: Payer: Self-pay | Admitting: *Deleted

## 2014-04-17 ENCOUNTER — Other Ambulatory Visit: Payer: BC Managed Care – PPO

## 2014-04-17 NOTE — Progress Notes (Addendum)
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 Microscopic Comment  Receptor Status: ER(+), PR (-), Her2-neu (+)  Did patient present with symptoms (if so, please note symptoms) or was this found on screening mammography?:Patient complained about swelling and redness around left lateral breast lesion with draining purulent fluid.Mass present 4 years.  Past/Anticipated interventions by surgeon, if any:  Past/Anticipated interventions by medical oncology, if any: Chemotherapy 2 to 3 cycles to be given before reassessing.Chemo to start April 23, 2014 to consist of taxotere/cytoxan with herceptin and Perjeta. Mri brain 04/10/14  reveals several small calvarial metastatic lesions.Scheduled for porta-cath placement on 04/20/2014.  Lymphedema issues, if any:No  Pain issues, if XEN:[id number] and achiness in right arm.Patient currently does right breast daily dressing change by cleaning with water and covering with dry gauze.  SAFETY ISSUES:  Prior radiation? No  Pacemaker/ICD? No  Possible current pregnancy?No  Is the patient on methotrexate? No  Current Complaints / other details: Married, no pregnancies and no family history of breast cancer.Menarche age 58.Last menstrual period 15 years ago (partial hysterectomy).No HRT Allergies:hydrocodone, penicillin and aspirin No smoking history    Reno Clasby Edison Nasuti, RN 04/17/2014,10:21 AM

## 2014-04-18 ENCOUNTER — Telehealth: Payer: Self-pay | Admitting: Hematology & Oncology

## 2014-04-18 ENCOUNTER — Encounter (HOSPITAL_COMMUNITY): Payer: Self-pay

## 2014-04-18 ENCOUNTER — Ambulatory Visit (HOSPITAL_COMMUNITY)
Admission: RE | Admit: 2014-04-18 | Discharge: 2014-04-18 | Disposition: A | Discharge status: Home / Self Care | Payer: BC Managed Care – PPO | Source: Ambulatory Visit | Attending: Hematology & Oncology | Admitting: Hematology & Oncology

## 2014-04-18 DIAGNOSIS — C787 Secondary malignant neoplasm of liver and intrahepatic bile duct: Secondary | ICD-10-CM | POA: Insufficient documentation

## 2014-04-18 DIAGNOSIS — Z01818 Encounter for other preprocedural examination: Secondary | ICD-10-CM | POA: Insufficient documentation

## 2014-04-18 DIAGNOSIS — C50919 Malignant neoplasm of unspecified site of unspecified female breast: Secondary | ICD-10-CM | POA: Insufficient documentation

## 2014-04-18 DIAGNOSIS — Z01810 Encounter for preprocedural cardiovascular examination: Secondary | ICD-10-CM | POA: Insufficient documentation

## 2014-04-18 MED ORDER — TECHNETIUM TC 99M-LABELED RED BLOOD CELLS IV KIT
25.3000 | PACK | Freq: Once | INTRAVENOUS | Status: AC | PRN
  Administered 2014-04-18: 25.3 via INTRAVENOUS

## 2014-04-18 NOTE — Telephone Encounter (Signed)
Left message with 4-27 time change. [place] is longer

## 2014-04-19 ENCOUNTER — Ambulatory Visit
Admission: RE | Admit: 2014-04-19 | Discharge: 2014-04-19 | Disposition: A | Discharge status: Home / Self Care | Payer: BC Managed Care – PPO | Source: Ambulatory Visit | Attending: Radiation Oncology | Admitting: Radiation Oncology

## 2014-04-19 ENCOUNTER — Encounter: Payer: Self-pay | Admitting: *Deleted

## 2014-04-19 ENCOUNTER — Encounter: Payer: Self-pay | Admitting: Radiation Oncology

## 2014-04-19 ENCOUNTER — Ambulatory Visit: Payer: BC Managed Care – PPO | Admitting: Radiation Oncology

## 2014-04-19 VITALS — BP 135/87 | HR 119 | Temp 97.8°F | Wt 163.6 lb

## 2014-04-19 DIAGNOSIS — Z90710 Acquired absence of both cervix and uterus: Secondary | ICD-10-CM | POA: Insufficient documentation

## 2014-04-19 DIAGNOSIS — C7952 Secondary malignant neoplasm of bone marrow: Secondary | ICD-10-CM | POA: Diagnosis not present

## 2014-04-19 DIAGNOSIS — Z79899 Other long term (current) drug therapy: Secondary | ICD-10-CM | POA: Diagnosis not present

## 2014-04-19 DIAGNOSIS — R918 Other nonspecific abnormal finding of lung field: Secondary | ICD-10-CM | POA: Diagnosis not present

## 2014-04-19 DIAGNOSIS — I10 Essential (primary) hypertension: Secondary | ICD-10-CM | POA: Diagnosis not present

## 2014-04-19 DIAGNOSIS — C787 Secondary malignant neoplasm of liver and intrahepatic bile duct: Secondary | ICD-10-CM | POA: Diagnosis present

## 2014-04-19 DIAGNOSIS — M545 Low back pain, unspecified: Secondary | ICD-10-CM | POA: Diagnosis not present

## 2014-04-19 DIAGNOSIS — C7951 Secondary malignant neoplasm of bone: Secondary | ICD-10-CM

## 2014-04-19 DIAGNOSIS — C779 Secondary and unspecified malignant neoplasm of lymph node, unspecified: Secondary | ICD-10-CM

## 2014-04-19 DIAGNOSIS — R599 Enlarged lymph nodes, unspecified: Secondary | ICD-10-CM | POA: Insufficient documentation

## 2014-04-19 DIAGNOSIS — Z170 Estrogen receptor positive status [ER+]: Secondary | ICD-10-CM | POA: Diagnosis not present

## 2014-04-19 DIAGNOSIS — C50919 Malignant neoplasm of unspecified site of unspecified female breast: Secondary | ICD-10-CM | POA: Diagnosis not present

## 2014-04-19 DIAGNOSIS — M25559 Pain in unspecified hip: Secondary | ICD-10-CM | POA: Insufficient documentation

## 2014-04-19 NOTE — [facility] Oncology Navigator Note (Signed)
Met with pt today.  Gave Alight bag and journey book.  Discussed navigation resources and gave contact information.  Discussed care port.  Encourage pt to call with any questions or concerns.  Received verbal understanding.

## 2014-04-19 NOTE — Progress Notes (Signed)
Please see the Nurse Progress Note in the MD Initial Consult Encounter for this patient. 

## 2014-04-20 ENCOUNTER — Ambulatory Visit (HOSPITAL_COMMUNITY)
Admission: RE | Admit: 2014-04-20 | Discharge: 2014-04-20 | Disposition: A | Discharge status: Home / Self Care | Payer: BC Managed Care – PPO | Source: Ambulatory Visit | Attending: Hematology & Oncology | Admitting: Hematology & Oncology

## 2014-04-20 ENCOUNTER — Encounter (HOSPITAL_COMMUNITY): Payer: Self-pay

## 2014-04-20 ENCOUNTER — Other Ambulatory Visit: Payer: Self-pay | Admitting: Hematology & Oncology

## 2014-04-20 DIAGNOSIS — C787 Secondary malignant neoplasm of liver and intrahepatic bile duct: Principal | ICD-10-CM

## 2014-04-20 DIAGNOSIS — Z9079 Acquired absence of other genital organ(s): Secondary | ICD-10-CM | POA: Diagnosis not present

## 2014-04-20 DIAGNOSIS — Z79899 Other long term (current) drug therapy: Secondary | ICD-10-CM | POA: Insufficient documentation

## 2014-04-20 DIAGNOSIS — C50919 Malignant neoplasm of unspecified site of unspecified female breast: Secondary | ICD-10-CM | POA: Diagnosis present

## 2014-04-20 DIAGNOSIS — C801 Malignant (primary) neoplasm, unspecified: Secondary | ICD-10-CM | POA: Insufficient documentation

## 2014-04-20 LAB — CBC WITH DIFFERENTIAL/PLATELET
Basophils Absolute: 0.1 10*3/uL (ref 0.0–0.1)
Basophils Relative: 1 % (ref 0–1)
EOS PCT: 2 % (ref 0–5)
Eosinophils Absolute: 0.2 10*3/uL (ref 0.0–0.7)
HCT: 29.1 % — ABNORMAL LOW (ref 36.0–46.0)
Hemoglobin: 9.6 g/dL — ABNORMAL LOW (ref 12.0–15.0)
Lymphocytes Relative: 23 % (ref 12–46)
Lymphs Abs: 1.6 10*3/uL (ref 0.7–4.0)
MCH: 30 pg (ref 26.0–34.0)
MCHC: 33 g/dL (ref 30.0–36.0)
MCV: 90.9 fL (ref 78.0–100.0)
Monocytes Absolute: 0.6 10*3/uL (ref 0.1–1.0)
Monocytes Relative: 9 % (ref 3–12)
Neutro Abs: 4.3 10*3/uL (ref 1.7–7.7)
Neutrophils Relative %: 65 % (ref 43–77)
Platelets: 451 10*3/uL — ABNORMAL HIGH (ref 150–400)
RBC: 3.2 MIL/uL — ABNORMAL LOW (ref 3.87–5.11)
RDW: 17 % — AB (ref 11.5–15.5)
WBC: 6.7 10*3/uL (ref 4.0–10.5)

## 2014-04-20 LAB — PROTIME-INR
INR: 1.23 (ref 0.00–1.49)
Prothrombin Time: 15.2 seconds (ref 11.6–15.2)

## 2014-04-20 LAB — APTT: aPTT: 38 seconds — ABNORMAL HIGH (ref 24–37)

## 2014-04-20 MED ORDER — MIDAZOLAM HCL 2 MG/2ML IJ SOLN
INTRAMUSCULAR | Status: AC | PRN
  Administered 2014-04-20: 0.5 mg via INTRAVENOUS
  Administered 2014-04-20: 1 mg via INTRAVENOUS
  Administered 2014-04-20 (×3): 0.5 mg via INTRAVENOUS

## 2014-04-20 MED ORDER — HEPARIN SOD (PORK) LOCK FLUSH 100 UNIT/ML IV SOLN
INTRAVENOUS | Status: AC | PRN
  Administered 2014-04-20: 500 [IU]

## 2014-04-20 MED ORDER — MIDAZOLAM HCL 2 MG/2ML IJ SOLN
INTRAMUSCULAR | Status: AC
  Filled 2014-04-20: qty 6

## 2014-04-20 MED ORDER — VANCOMYCIN HCL IN DEXTROSE 1-5 GM/200ML-% IV SOLN
1000.0000 mg | Freq: Once | INTRAVENOUS | Status: AC
  Administered 2014-04-20: 1000 mg via INTRAVENOUS
  Filled 2014-04-20: qty 200

## 2014-04-20 MED ORDER — FENTANYL CITRATE 0.05 MG/ML IJ SOLN
INTRAMUSCULAR | Status: AC | PRN
  Administered 2014-04-20 (×2): 25 ug via INTRAVENOUS

## 2014-04-20 MED ORDER — FENTANYL CITRATE 0.05 MG/ML IJ SOLN
INTRAMUSCULAR | Status: AC
  Filled 2014-04-20: qty 6

## 2014-04-20 MED ORDER — LIDOCAINE-EPINEPHRINE (PF) 2 %-1:200000 IJ SOLN
INTRAMUSCULAR | Status: AC
  Filled 2014-04-20: qty 20

## 2014-04-20 MED ORDER — SODIUM CHLORIDE 0.9 % IV SOLN
INTRAVENOUS | Status: DC
  Administered 2014-04-20: 12:00:00 via INTRAVENOUS

## 2014-04-20 MED ORDER — HEPARIN SOD (PORK) LOCK FLUSH 100 UNIT/ML IV SOLN
INTRAVENOUS | Status: AC
  Filled 2014-04-20: qty 5

## 2014-04-20 NOTE — Procedures (Signed)
Successful placement of left IJ approach port-a-cath with tip at the superior caval atrial junction. The catheter is ready for immediate use. No immediate post procedural complications. 

## 2014-04-20 NOTE — H&P (Signed)
Chief Complaint: "I'm here for a port" Referring Physician:Ennever HPI: Mary [name] is an 58 y.o. female who has been diagnosed with metastatic breast cancer. She is to be gin chemotherapy and is scheduled today for [facility] placement. PMHx and meds reviewed. Pt feels well otherwise, no recent fevers, chills.  Past Medical History:  Past Medical History  Diagnosis Date  . Arthritis   . Depression   . Fainting   . Headache   . Hypertension   . Migraines   . History of blood transfusion   . Gastric ulcer   . Breast cancer metastasized to multiple sites 04/12/2014    Past Surgical History:  Past Surgical History  Procedure Laterality Date  . Abdominal hysterectomy  15 years go    partial    Family History:  Family History  Problem Relation Age of Onset  . Arthritis Other   . Hypertension Other   . Hypertension Other   . Heart disease Other   . Stroke Other     Social History:  reports that she has never smoked. She has never used smokeless tobacco. She reports that she does not drink alcohol or use illicit drugs.  Allergies:  Allergies  Allergen Reactions  . Hydrocodone     "makes me crawl on the floor"  . Aspirin Other (See Comments)    Due to ulcers  . Penicillins Swelling    Medications:   Medication List    ASK your doctor about these medications       amLODipine 5 MG tablet  Commonly known as:  NORVASC  Take 5 mg by mouth every morning.     clindamycin 150 MG capsule  Commonly known as:  CLEOCIN  Take 600 mg by mouth 3 (three) times daily. She is taking for 7 days. She started on 04/11/14 and has not completed the regimen yet.     escitalopram 20 MG tablet  Commonly known as:  LEXAPRO  Take 20 mg by mouth every morning.     HEADACHE RELIEF PM 38-500 MG Tabs  Generic drug:  Diphenhydramine-APAP (sleep)  Take 2 tablets by mouth daily as needed (For headaches.).     lisinopril 40 MG tablet  Commonly known as:  PRINIVIL,ZESTRIL  Take 40 mg by mouth  every morning.     MOBIC 7.5 MG tablet  Generic drug:  meloxicam  Take 7.5 mg by mouth daily as needed for pain.     oxyCODONE 5 MG immediate release tablet  Commonly known as:  Oxy IR/ROXICODONE  Take 1 tablet (5 mg total) by mouth every 6 (six) hours as needed for severe pain.     SUMAtriptan 100 MG tablet  Commonly known as:  IMITREX  Take 1 tablet (100 mg total) by mouth every 2 (two) hours as needed for migraine.        Please HPI for pertinent positives, otherwise complete 10 system ROS negative.  Physical Exam: BP 155/99  Pulse 115  Temp(Src) 97.8 F (36.6 C) (Oral)  Resp 18  SpO2 99% There is no weight on file to calculate BMI.   General Appearance:  Alert, cooperative, no distress, appears stated age  Head:  Normocephalic, without obvious abnormality, atraumatic  ENT: Unremarkable  Neck: Supple, symmetrical, trachea midline  Lungs:   Clear to auscultation bilaterally, no w/r/r.  Chest Wall:  No tenderness or deformity  Heart:  Regular rate and rhythm, S1, S2 normal, no murmur, rub or gallop.  Abdomen:   Soft, non-tender, non distended.  Neurologic: Normal affect, no gross deficits.   Results for orders placed during the hospital encounter of 04/20/14 (from the past 48 hour(s))  APTT     Status: Abnormal   Collection Time    04/20/14 11:46 AM      Result Value Ref Range   aPTT 38 (*) 24 - 37 seconds   Comment:            IF BASELINE aPTT IS ELEVATED,     SUGGEST PATIENT RISK ASSESSMENT     BE USED TO DETERMINE APPROPRIATE     ANTICOAGULANT THERAPY.  CBC WITH DIFFERENTIAL     Status: Abnormal   Collection Time    04/20/14 11:46 AM      Result Value Ref Range   WBC 6.7  4.0 - 10.5 K/uL   RBC 3.20 (*) 3.87 - 5.11 MIL/uL   Hemoglobin 9.6 (*) 12.0 - 15.0 g/dL   HCT 29.1 (*) 36.0 - 46.0 %   MCV 90.9  78.0 - 100.0 fL   MCH 30.0  26.0 - 34.0 pg   MCHC 33.0  30.0 - 36.0 g/dL   RDW 17.0 (*) 11.5 - 15.5 %   Platelets 451 (*) 150 - 400 K/uL   Neutrophils  Relative % 65  43 - 77 %   Neutro Abs 4.3  1.7 - 7.7 K/uL   Lymphocytes Relative 23  12 - 46 %   Lymphs Abs 1.6  0.7 - 4.0 K/uL   Monocytes Relative 9  3 - 12 %   Monocytes Absolute 0.6  0.1 - 1.0 K/uL   Eosinophils Relative 2  0 - 5 %   Eosinophils Absolute 0.2  0.0 - 0.7 K/uL   Basophils Relative 1  0 - 1 %   Basophils Absolute 0.1  0.0 - 0.1 K/uL  PROTIME-INR     Status: None   Collection Time    04/20/14 11:46 AM      Result Value Ref Range   Prothrombin Time 15.2  11.6 - 15.2 seconds   INR 1.23  0.00 - 1.49   No results found.  Assessment/Plan Metastatic breast cancer For portacath placement. Discussed procedure, risks, complications, use of sedation. Labs reviewed. Consent signed in chart  Ascencion Dike PA-C 04/20/2014, 12:32 PM

## 2014-04-20 NOTE — Discharge Instructions (Signed)
Moderate Sedation, Adult °Moderate sedation is given to help you relax or even sleep through a procedure. You may remain sleepy, be clumsy, or have poor balance for several hours following this procedure. Arrange for a responsible adult, family member, or friend to take you home. A responsible adult should stay with you for at least 24 hours or until the medicines have worn off. °· Do not participate in any activities where you could become injured for the next 24 hours, or until you feel normal again. Do not: °· Drive. °· Swim. °· Ride a bicycle. °· Operate heavy machinery. °· Cook. °· Use power tools. °· Climb ladders. °· Work at heights. °· Do not make important decisions or sign legal documents until you are improved. °· Vomiting may occur if you eat too soon. When you can drink without vomiting, try water, juice, or soup. Try solid foods if you feel little or no nausea. °· Only take over-the-counter or prescription medications for pain, discomfort, or fever as directed by your caregiver.If pain medications have been prescribed for you, ask your caregiver how soon it is safe to take them. °· Make sure you and your family fully understands everything about the medication given to you. Make sure you understand what side effects may occur. °· You should not drink alcohol, take sleeping pills, or medications that cause drowsiness for at least 24 hours. °· If you smoke, do not smoke alone. °· If you are feeling better, you may resume normal activities 24 hours after receiving sedation. °· Keep all appointments as scheduled. Follow all instructions. °· Ask questions if you do not understand. °SEEK MEDICAL CARE IF:  °· Your skin is pale or bluish in color. °· You continue to feel sick to your stomach (nauseous) or throw up (vomit). °· Your pain is getting worse and not helped by medication. °· You have bleeding or swelling. °· You are still sleepy or feeling clumsy after 24 hours. °SEEK IMMEDIATE MEDICAL CARE IF:   °· You develop a rash. °· You have difficulty breathing. °· You develop any type of allergic problem. °· You have a fever. °Document Released: 09/08/2001 Document Revised: 03/07/2012 Document Reviewed: 08/21/2013 °[employer]® Patient Information ©2014 [employer]. °Implanted Port Home Guide °An implanted port is a type of central line that is placed under the skin. Central lines are used to provide IV access when treatment or nutrition needs to be given through a person's veins. Implanted ports are used for long-term IV access. An implanted port may be placed because:  °· You need IV medicine that would be irritating to the small veins in your hands or arms.   °· You need long-term IV medicines, such as antibiotics.   °· You need IV nutrition for a long period.   °· You need frequent blood draws for lab tests.   °· You need dialysis.   °Implanted ports are usually placed in the chest area, but they can also be placed in the upper arm, the abdomen, or the leg. An implanted port has two main parts:  °· Reservoir. The reservoir is round and will appear as a small, raised area under your skin. The reservoir is the part where a needle is inserted to give medicines or draw blood.   °· Catheter. The catheter is a thin, flexible tube that extends from the reservoir. The catheter is placed into a large vein. Medicine that is inserted into the reservoir goes into the catheter and then into the vein.   °HOW WILL I CARE FOR MY   INCISION SITE? Do not get the incision site wet. Bathe or shower as directed by your health care provider.  HOW IS MY PORT ACCESSED? Special steps must be taken to access the port:   Before the port is accessed, a numbing cream can be placed on the skin. This helps numb the skin over the port site.   Your health care provider uses a sterile technique to access the port.  Your health care provider must put on a mask and sterile gloves.  The skin over your port is cleaned carefully with an  antiseptic and allowed to dry.  The port is gently pinched between sterile gloves, and a needle is inserted into the port.  Only "non-coring" port needles should be used to access the port. Once the port is accessed, a blood return should be checked. This helps ensure that the port is in the vein and is not clogged.   If your port needs to remain accessed for a constant infusion, a clear (transparent) bandage will be placed over the needle site. The bandage and needle will need to be changed every week, or as directed by your health care provider.   Keep the bandage covering the needle clean and dry. Do not get it wet. Follow your health care provider's instructions on how to take a shower or bath while the port is accessed.   If your port does not need to stay accessed, no bandage is needed over the port.  WHAT IS FLUSHING? Flushing helps keep the port from getting clogged. Follow your health care provider's instructions on how and when to flush the port. Ports are usually flushed with saline solution or a medicine called heparin. The need for flushing will depend on how the port is used.   If the port is used for intermittent medicines or blood draws, the port will need to be flushed:   After medicines have been given.   After blood has been drawn.   As part of routine maintenance.   If a constant infusion is running, the port may not need to be flushed.  HOW LONG WILL MY PORT STAY IMPLANTED? The port can stay in for as long as your health care provider thinks it is needed. When it is time for the port to come out, surgery will be done to remove it. The procedure is similar to the one performed when the port was put in.  WHEN SHOULD I SEEK IMMEDIATE MEDICAL CARE? When you have an implanted port, you should seek immediate medical care if:   You notice a bad smell coming from the incision site.   You have swelling, redness, or drainage at the incision site.   You have more  swelling or pain at the port site or the surrounding area.   You have a fever that is not controlled with medicine. Document Released: 12/14/2005 Document Revised: 10/04/2013 Document Reviewed: 08/21/2013 [facility] Patient Information 2014 [place]. Implanted Port Insertion, Care After Refer to this sheet in the next few weeks. These instructions provide you with information on caring for yourself after your procedure. Your health care provider may also give you more specific instructions. Your treatment has been planned according to current medical practices, but problems sometimes occur. Call your health care provider if you have any problems or questions after your procedure. WHAT TO EXPECT AFTER THE PROCEDURE After your procedure, it is typical to have the following:   Discomfort at the port insertion site. Ice packs to the  area will help.  Bruising on the skin over the port. This will subside in 3 4 days. HOME CARE INSTRUCTIONS  After your port is placed, you will get a manufacturer's information card. The card has information about your port. Keep this card with you at all times.   Know what kind of port you have. There are many types of ports available.   Wear a medical alert bracelet in case of an emergency. This can help alert health care workers that you have a port.   The port can stay in for as long as your health care provider believes it is necessary.   A home health care nurse may give medicines and take care of the port.   You or a family member can get special training and directions for giving medicine and taking care of the port at home.  SEEK MEDICAL CARE IF:  Your port does not flush or you are unable to get a blood return.   SEEK IMMEDIATE MEDICAL CARE IF:  You have new fluid or pus coming from your incision.   You notice a bad smell coming from your incision site.   You have swelling, pain, or more redness at the incision or port site.   You  have a fever or chills.   You have chest pain or shortness of breath. Document Released: 10/04/2013 Document Reviewed: 08/21/2013 [facility] Patient Information 2014 [place], [place].

## 2014-04-21 NOTE — Progress Notes (Signed)
Radiation Oncology         [phone]) [phone] ________________________________  Initial outpatient Consultation - Date: 04/19/2014   Name: Mary [name] MRN: [id number]   DOB: 1956-08-07  REFERRING PHYSICIAN: Isaac Bliss, [place]*  DIAGNOSIS:  1. Breast cancer metastasized to multiple sites     STAGE: Breast cancer metastasized to multiple sites   Primary site: Breast   Staging method: AJCC 7th Edition   Clinical: Stage IV (T4b, N3, M1) signed by Volanda Napoleon, MD on 04/12/2014 11:42 AM   Summary: Stage IV (T4b, N3, M1)   HISTORY OF PRESENT ILLNESS::Mary [name] is a 58 y.o. female  who presented to the hospital earlier this month do to a fungating right breast mass. Staging studies showed significant right axillary adenopathy as well as left axillary adenopathy and right hilar adenopathy. Further mediastinal adenopathy was also noticed. Involvement of the pectoralis muscle was also noted. A pulmonary nodules were noted the largest measuring 1.3 cm. Multiple lytic lesions were noted in the bone windows including multiple rib lesions. Multiple hepatic lesions were noted and a metastatic disease to the pelvis was also noted. Biopsy of the liver lesions was performed on 04/09/2014. This showed metastatic adenocarcinoma.  This was ER positive at 19% PR negative and HER-2 positive.She saw medical oncology and chemotherapy is planned. She was referred to me for possible palliative radiation to her sites of bony pain. In speaking with her she does have some low back pain which is a chronic issue for her and some left hip pain. She is taking an occasional oxycodone for her hip pain. I should also note she had an MRI of the brain which is negative for intracranial metastatic disease. She did have some metastatic disease to the calvarium. She has not had any headaches. She has diminished appetite and energy.Marland Kitchen  PREVIOUS RADIATION THERAPY: No  PAST MEDICAL HISTORY:  has a past medical history of  Arthritis; Depression; Fainting; Headache; Hypertension; Migraines; History of blood transfusion; Gastric ulcer; and Breast cancer metastasized to multiple sites (04/12/2014).    PAST SURGICAL HISTORY: Past Surgical History  Procedure Laterality Date  . Abdominal hysterectomy  15 years go    partial    FAMILY HISTORY:  Family History  Problem Relation Age of Onset  . Arthritis Other   . Hypertension Other   . Hypertension Other   . Heart disease Other   . Stroke Other     SOCIAL HISTORY:  History  Substance Use Topics  . Smoking status: Never Smoker   . Smokeless tobacco: Never Used     Comment: never used tobacco  . Alcohol Use: No    ALLERGIES: Hydrocodone; Aspirin; and Penicillins  MEDICATIONS:  Current Outpatient Prescriptions  Medication Sig Dispense Refill  . amLODipine (NORVASC) 5 MG tablet Take 5 mg by mouth every morning.      . clindamycin (CLEOCIN) 150 MG capsule Take 600 mg by mouth 3 (three) times daily. She is taking for 7 days. She started on 04/11/14 and has not completed the regimen yet.      . Diphenhydramine-APAP, sleep, (HEADACHE RELIEF PM) 38-500 MG TABS Take 2 tablets by mouth daily as needed (For headaches.).      Marland Kitchen escitalopram (LEXAPRO) 20 MG tablet Take 20 mg by mouth every morning.      Marland Kitchen lisinopril (PRINIVIL,ZESTRIL) 40 MG tablet Take 40 mg by mouth every morning.      . meloxicam (MOBIC) 7.5 MG tablet Take 7.5 mg by mouth daily as needed  for pain.       Marland Kitchen oxyCODONE (OXY IR/ROXICODONE) 5 MG immediate release tablet Take 1 tablet (5 mg total) by mouth every 6 (six) hours as needed for severe pain.  30 tablet  0  . SUMAtriptan (IMITREX) 100 MG tablet Take 1 tablet (100 mg total) by mouth every 2 (two) hours as needed for migraine.  10 tablet  3   No current facility-administered medications for this encounter.    REVIEW OF SYSTEMS:  A 15 point review of systems is documented in the electronic medical record. This was obtained by the nursing staff.  However, I reviewed this with the patient to discuss relevant findings and make appropriate changes.  Pertinent items are noted in HPI.  PHYSICAL EXAM:  Filed Vitals:   04/19/14 0847  BP: 135/87  Pulse: 119  Temp: 97.8 F (36.6 C)  .163 lb 9.6 oz (74.208 kg). Pleasant alert and awake female in no distress sitting comfortably on examining chair. She has no palpable cervical or subclavicular adenopathy. She has palpable right axillary adenopathy. Examination of the right breast shows a fungating hard lesion in the upper outer quadrant. Cranial nerves are intact. She is 5 out of 5 strength bilaterally. She has normal gait.  LABORATORY DATA:  Lab Results  Component Value Date   WBC 6.7 04/20/2014   HGB 9.6* 04/20/2014   HCT 29.1* 04/20/2014   MCV 90.9 04/20/2014   PLT 451* 04/20/2014   Lab Results  Component Value Date   NA 140 04/12/2014   K 3.7 04/12/2014   CL 99 04/12/2014   CO2 29 04/12/2014   Lab Results  Component Value Date   ALT 41 04/12/2014   AST 138* 04/12/2014   ALKPHOS 227* 04/12/2014   BILITOT 1.00 04/12/2014     RADIOGRAPHY: Dg Thoracic Spine 2 View  04/05/2014   CLINICAL DATA:  Back pain  EXAM: THORACIC SPINE - 2 VIEW  COMPARISON:  CXR 07/04/2013  FINDINGS: There is no evidence of thoracic spine fracture. Alignment is normal. No other significant bone abnormalities are identified.  IMPRESSION: Negative.   Electronically Signed   By: Marlan Palau M.D.   On: 04/05/2014 14:20   Ct Chest W Contrast  04/08/2014   CLINICAL DATA:  Recent diagnosis of breast cancer, initial staging.  EXAM: CT CHEST, ABDOMEN, AND PELVIS WITH CONTRAST  TECHNIQUE: Multidetector CT imaging of the chest, abdomen and pelvis was performed following the standard protocol during bolus administration of intravenous contrast.  CONTRAST:  1 OMNIPAQUE IOHEXOL 300 MG/ML SOLN, OMNIPAQUE IOHEXOL 300 MG/ML SOLN  COMPARISON:  None.  FINDINGS: CT CHEST FINDINGS  Mild asymmetric fullness of the left thyroid lobe may  suggest underlying nodule, with punctate calcification identified image 10. Great vessels are normal in caliber. Heart size is normal. Small, right greater than left, pleural effusions are identified. Associated compressive atelectasis is noted. No pericardial effusion.  There is a centrally necrotic right upper outer quadrant breast mass measuring 6.2 x 6.0 x 7.2 cm image 27. This is immediately contiguous with the overlying skin. No discernible fat plane is identified between the mass and the underlying pectoralis minor muscle, for example image 26. Associated right breast skin and trabecular thickening are identified. Centrally hypodense likely necrotic dominant right axillary lymphadenopathy is present, largest representative node measuring 2.6 cm in short axis diameter image 19. Retropectoral lymphadenopathy is also identified, representative 1.4 cm level 3 retropectoral node identified image 12. Several left axillary nodes are identified with prominent cortices  subjectively, but no adenopathy. AP window lymphadenopathy is identified, largest 1.6 cm image 20. Pretracheal lymphadenopathy measures 1.3 cm image 20. Right hilar lymphadenopathy measures 1.1 cm image 26. Epicardial lymphadenopathy measures 0.8 cm image 42.  Innumerable oval and round pulmonary masses are identified throughout both lungs, largest right upper lobe abutting the major fissure measuring 1.3 cm image 21.  Bone windows demonstrate multiple lytic lesions in the thoracic spine for example image 40, and sternum image 21. Multiple lytic rib lesions are also noted, including expansile left ninth lateral rib lesion with surrounding soft tissue component and pathologic fracture image 41.  CT ABDOMEN AND PELVIS FINDINGS  There are innumerable irregular hypodense hepatic masses with rim enhancement including a dominant lateral segment left hepatic lobe mass measuring at least 6.3 x 5.2 cm image 59. Several of these hepatic lesions are associated  with capsular retraction and trace perihepatic ascites is noted, for example image 55. Porta hepatis node versus exophytic hepatic mass measures 1.9 cm image 60. Peritoneal nodules are also identified, for example right upper quadrant measuring 0.8 cm image 69.  Multiple hypodense splenic lesions are identified, largest 1.1 cm image 59. Statistically these are most likely cysts or lymphangiomas, less likely hemangiomas, although metastatic disease is within the differential diagnosis.  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 No hydroureteronephrosis. Adrenal glands, pancreas, and collapse gallbladder are unremarkable. Ascites tracks to the pelvis, overall small in volume. No bowel wall thickening or focal segmental dilatation is identified. The appendix is normal. Bladder is normal.  In the left adnexa, there is nodularity measuring 2.7 cm image 101. The patient is status post hysterectomy. Right ovary is likely visualized image 96. The nodularity in the left adnexa may represent the left ovary although metastatic disease is not excluded as ovarian remnant tissue is thought to be more likely located along the left pelvic sidewall image 97. Small fat containing left inguinal hernia.  Mild disc degenerative change noted. L4 and L5 and L5-S1 osteoarthritic facet change noted. Lytic sacral lesion identified for example image 91. There are also ill-defined lytic lesions elsewhere in the bony pelvis for example sagittal reformatted image 108 representative lesion. No compression deformity.  IMPRESSION: Dominant right upper outer quadrant breast mass with findings overall suggestive of stage IV metastatic disease involving lungs, liver, and bone as above. Multi station lymphadenopathy is also described as above. If the target for biopsy is needed, the dominant right upper outer quadrant breast mass and liver masses would likely be most amenable to percutaneous sampling.  Pathologic  left lateral ninth rib fracture. This is associated with the lytic presumably metastatic lesion with soft tissue component as described above.  Left adnexal mass versus remnant ovary status post hysterectomy. This is amenable to further characterization and follow-up at presumed future restaging exams as clinically needed.   Electronically Signed   By: Conchita Paris M.D.   On: 04/08/2014 11:37   Mr Jeri Cos BM Contrast  04/10/2014   CLINICAL DATA:  Recent diagnosis of stage IV breast cancer. New onset of dizziness and headaches. Evaluate for possibility of intracranial metastatic disease. History of high blood pressure and migraine headaches.  EXAM: MRI HEAD WITHOUT AND WITH CONTRAST  TECHNIQUE: Multiplanar, multiecho pulse sequences of the brain and surrounding structures were obtained without and with intravenous contrast.  CONTRAST:  37m MULTIHANCE GADOBENATE DIMEGLUMINE 529 MG/ML IV SOLN  COMPARISON:  No comparison brain MR or head CT. Comparison CT scan of the chest,  abdomen and pelvis 04/08/2014.  FINDINGS: No acute infarct.  Several small calvarial metastatic lesions (best appreciated on diffusion sequence).  Heterogeneous appearance of the bone marrow of the clivus/ skullbase and upper cervical spine without discrete bony destructive lesion.  No parenchymal enhancing lesion to suggest the presence of parenchymal metastatic disease.  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  No intracranial hemorrhage.  No hydrocephalus.  Major intracranial vascular structures are patent.  Cervical medullary junction, pituitary region, pineal region and orbital structures unremarkable.  Mucosal thickening maxillary sinuses.  IMPRESSION: Several small calvarial metastatic lesions.  No parenchymal enhancing lesion to suggest the presence of brain parenchymal metastatic disease.  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  Mucosal thickening maxillary sinuses.   Electronically Signed    By: Chauncey Cruel M.D.   On: 04/10/2014 08:16   Nm Cardiac Muga Rest  04/18/2014   CLINICAL DATA:  Breast cancer. New diagnosis. Pre chemotherapy cardiac evaluation.  EXAM: NUCLEAR MEDICINE CARDIAC BLOOD POOL IMAGING (MUGA)  TECHNIQUE: Cardiac multi-gated acquisition was performed at rest following intravenous injection of Tc-38m labeled red blood cells.  RADIOPHARMACEUTICALS:  25.3MILLI CURIE ULTRATAG TECHNETIUM TC 22M-LABELED RED BLOOD CELLS IV KITmCiTc-77m in-vitro labeled red blood cells.  COMPARISON:  CT 04/08/2014  FINDINGS: There is a abnormal contour of the left lateral ventricular wall. This may be artifact from adjacent elevated right hemidiaphragm.  Calculated left ventricular ejection fraction equals 61%.  IMPRESSION: 1. Questionable left lateral ventricular wall abnormality. This may be an attenuation artifact. 2. Normal left ventricular ejection fraction equals 61%.   Electronically Signed   By: Suzy Bouchard M.D.   On: 04/18/2014 12:43   [place] Chest  04/06/2014   CLINICAL DATA:  Patient admitted for right breast cellulitis. Per the admitting PA, the patient has peau d'orange of the right breast and erythema. The PA states that there is a palpable abnormality at 11 o'clock, 14 cm from the nipple.  EXAM: ULTRASOUND OF THE RIGHT BREAST  COMPARISON:  None  FINDINGS: Physical exam could not be performed since the ultrasound was done off site due to the patient being admitted.  Targeted ultrasound at 11 o'clock, 14 cm from the nipple demonstrates a large mass with internal vascularity. This measures at least 7 x 6.3 x 5.1 cm. Due to the large size, exact measurements are difficult. Foci of increased echogenicity are noted within the mass, suggestive of calcifications.  IMPRESSION: Suspicious right breast mass.  RECOMMENDATION: A bilateral diagnostic ultrasound and possibly repeat right breast ultrasound is recommended. Following this, the patient will need an ultrasound-guided core needle biopsy of the  right breast mass imaged today at 11 o'clock. Further recommendations will be made following complete workup.  Results and recommendations were discussed with PA Megan Dort at 4:20 p.m. on 04/06/2014.  BI-RADS CATEGORY  0: Incomplete. Need additional imaging evaluation and/or prior mammograms for comparison.   Electronically Signed   By: Donavan Burnet M.D.   On: 04/06/2014 16:27   Ct Abdomen Pelvis W Contrast  04/08/2014   CLINICAL DATA:  Recent diagnosis of breast cancer, initial staging.  EXAM: CT CHEST, ABDOMEN, AND PELVIS WITH CONTRAST  TECHNIQUE: Multidetector CT imaging of the chest, abdomen and pelvis was performed following the standard protocol during bolus administration of intravenous contrast.  CONTRAST:  1 OMNIPAQUE IOHEXOL 300 MG/ML SOLN, 155mL OMNIPAQUE IOHEXOL 300 MG/ML SOLN  COMPARISON:  None.  FINDINGS: CT CHEST FINDINGS  Mild asymmetric fullness of the left thyroid lobe may suggest underlying nodule,  with punctate calcification identified image 10. Great vessels are normal in caliber. Heart size is normal. Small, right greater than left, pleural effusions are identified. Associated compressive atelectasis is noted. No pericardial effusion.  There is a centrally necrotic right upper outer quadrant breast mass measuring 6.2 x 6.0 x 7.2 cm image 27. This is immediately contiguous with the overlying skin. No discernible fat plane is identified between the mass and the underlying pectoralis minor muscle, for example image 26. Associated right breast skin and trabecular thickening are identified. Centrally hypodense likely necrotic dominant right axillary lymphadenopathy is present, largest representative node measuring 2.6 cm in short axis diameter image 19. Retropectoral lymphadenopathy is also identified, representative 1.4 cm level 3 retropectoral node identified image 12. Several left axillary nodes are identified with prominent cortices subjectively, but no adenopathy. AP window  lymphadenopathy is identified, largest 1.6 cm image 20. Pretracheal lymphadenopathy measures 1.3 cm image 20. Right hilar lymphadenopathy measures 1.1 cm image 26. Epicardial lymphadenopathy measures 0.8 cm image 42.  Innumerable oval and round pulmonary masses are identified throughout both lungs, largest right upper lobe abutting the major fissure measuring 1.3 cm image 21.  Bone windows demonstrate multiple lytic lesions in the thoracic spine for example image 40, and sternum image 21. Multiple lytic rib lesions are also noted, including expansile left ninth lateral rib lesion with surrounding soft tissue component and pathologic fracture image 41.  CT ABDOMEN AND PELVIS FINDINGS  There are innumerable irregular hypodense hepatic masses with rim enhancement including a dominant lateral segment left hepatic lobe mass measuring at least 6.3 x 5.2 cm image 59. Several of these hepatic lesions are associated with capsular retraction and trace perihepatic ascites is noted, for example image 55. Porta hepatis node versus exophytic hepatic mass measures 1.9 cm image 60. Peritoneal nodules are also identified, for example right upper quadrant measuring 0.8 cm image 69.  Multiple hypodense splenic lesions are identified, largest 1.1 cm image 59. Statistically these are most likely cysts or lymphangiomas, less likely hemangiomas, although metastatic disease is within the differential diagnosis.  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 No hydroureteronephrosis. Adrenal glands, pancreas, and collapse gallbladder are unremarkable. Ascites tracks to the pelvis, overall small in volume. No bowel wall thickening or focal segmental dilatation is identified. The appendix is normal. Bladder is normal.  In the left adnexa, there is nodularity measuring 2.7 cm image 101. The patient is status post hysterectomy. Right ovary is likely visualized image 96. The nodularity in the left adnexa may  represent the left ovary although metastatic disease is not excluded as ovarian remnant tissue is thought to be more likely located along the left pelvic sidewall image 97. Small fat containing left inguinal hernia.  Mild disc degenerative change noted. L4 and L5 and L5-S1 osteoarthritic facet change noted. Lytic sacral lesion identified for example image 91. There are also ill-defined lytic lesions elsewhere in the bony pelvis for example sagittal reformatted image 108 representative lesion. No compression deformity.  IMPRESSION: Dominant right upper outer quadrant breast mass with findings overall suggestive of stage IV metastatic disease involving lungs, liver, and bone as above. Multi station lymphadenopathy is also described as above. If the target for biopsy is needed, the dominant right upper outer quadrant breast mass and liver masses would likely be most amenable to percutaneous sampling.  Pathologic left lateral ninth rib fracture. This is associated with the lytic presumably metastatic lesion with soft tissue component as described above.  Left  adnexal mass versus remnant ovary status post hysterectomy. This is amenable to further characterization and follow-up at presumed future restaging exams as clinically needed.   Electronically Signed   By: Christiana Pellant M.D.   On: 04/08/2014 11:37   US Biopsy  04/09/2014   CLINICAL DATA:  Hepatic metastases, known large right breast mass with axillary lymph nodes, clinical diagnosis is suspected metastatic breast cancer  EXAM: ULTRASOUND GUIDED CORE BIOPSY OF LEFT LIVER MASS  MEDICATIONS: 1 mg IV Versed; 100 mcg IV Fentanyl  Total Moderate Sedation Time: 10  PROCEDURE: The procedure, risks, benefits, and alternatives were explained to the patient. Questions regarding the procedure were encouraged and answered. The patient understands and consents to the procedure.  The SUBXIPHOID REGION was prepped with BETADINE in a sterile fashion, and a sterile drape was  applied covering the operative field. A sterile gown and sterile gloves were used for the procedure. Local anesthesia was provided with 1% Lidocaine.  Previous imaging reviewed. Large left hepatic mass is well visualized from a subxiphoid approach. Under sterile conditions and local anesthesia, a 17 gauge 6.8 cm access needle was advanced into the left liver mass with direct ultrasound guidance. Needle position confirmed. 3 18 gauge core biopsies obtained. Samples were placed in formalin. No immediate complication. Needle tract embolized with Gelfoam pledgets. Postprocedure imaging demonstrates no hemorrhage or hematoma. Patient tolerated the biopsy well.  COMPLICATIONS: None.  FINDINGS: Imaging confirms needle placement in the large left hepatic mass within the lateral segment.  IMPRESSION: Successful ultrasound left liver mass 18 gauge core biopsy   Electronically Signed   By: Ruel Favors M.D.   On: 04/09/2014 12:21   Ir Fluoro Guide Cv Line Left  04/20/2014   INDICATION: History of right-sided metastatic breast cancer, in need of intravenous access for chemotherapy administration  EXAM: IMPLANTED PORT A CATH PLACEMENT WITH ULTRASOUND AND FLUOROSCOPIC GUIDANCE  COMPARISON:  CT the chest, abdomen and pelvis - 04/17/2014  MEDICATIONS: Vancomycin 1 gm IV; IV antibiotic was given in an appropriate time interval prior to skin puncture.  ANESTHESIA/SEDATION: Versed 3 mg IV; Fentanyl 50 mcg IV;  Total Moderate Sedation Time  20  minutes.  CONTRAST:  None  FLUOROSCOPY TIME:  24 seconds.  COMPLICATIONS: None immediate  PROCEDURE: The procedure, risks, benefits, and alternatives were explained to the patient. Questions regarding the procedure were encouraged and answered. The patient understands and consents to the procedure.  Given the presence of the right-sided breast cancer, the decision was made to place a left anterior chest wall internal jugular approach port a catheter. As such, of the left neck and chest were  prepped with chlorhexidine in a sterile fashion, and a sterile drape was applied covering the operative field. Maximum barrier sterile technique with sterile gowns and gloves were used for the procedure. A timeout was performed prior to the initiation of the procedure. Local anesthesia was provided with 1% lidocaine with epinephrine.  After creating a small venotomy incision, a micropuncture kit was utilized to access the internal jugular vein under direct, real-time ultrasound guidance. Ultrasound image documentation was performed. The microwire was kinked to measure appropriate catheter length.  A subcutaneous port pocket was then created along the upper chest wall utilizing a combination of sharp and blunt dissection. The pocket was irrigated with sterile saline. A single lumen ISP power injectable port was chosen for placement. The 8 Fr catheter was tunneled from the port pocket site to the venotomy incision. The port was placed in the  pocket. The external catheter was trimmed to appropriate length. At the venotomy, an 8 Fr peel-away sheath was placed over a guidewire under fluoroscopic guidance. The catheter was then placed through the sheath and the sheath was removed. Final catheter positioning was confirmed and documented with a fluoroscopic spot radiograph. The port was accessed with a Huber needle, aspirated and flushed with heparinized saline.  The venotomy site was closed with an interrupted 4-0 Vicryl suture. The port pocket incision was closed with interrupted 2-0 Vicryl suture and the skin was opposed with a running subcuticular 4-0 Vicryl suture. Dermabond and Steri-strips were applied to both incisions. Dressings were placed. The patient tolerated the procedure well without immediate post procedural complication.  FINDINGS: After catheter placement, the tip lies within the superior cavoatrial junction. The catheter aspirates and flushes normally and is ready for immediate use.  IMPRESSION:  Successful placement of a left internal jugular approach power injectable Port-A-Cath. The catheter is ready for immediate use.   Electronically Signed   By: Sandi Mariscal M.D.   On: 04/20/2014 17:08   Ir US Guide Vasc Access Left  04/20/2014   INDICATION: History of right-sided metastatic breast cancer, in need of intravenous access for chemotherapy administration  EXAM: IMPLANTED PORT A CATH PLACEMENT WITH ULTRASOUND AND FLUOROSCOPIC GUIDANCE  COMPARISON:  CT the chest, abdomen and pelvis - 04/17/2014  MEDICATIONS: Vancomycin 1 gm IV; IV antibiotic was given in an appropriate time interval prior to skin puncture.  ANESTHESIA/SEDATION: Versed 3 mg IV; Fentanyl 50 mcg IV;  Total Moderate Sedation Time  20  minutes.  CONTRAST:  None  FLUOROSCOPY TIME:  24 seconds.  COMPLICATIONS: None immediate  PROCEDURE: The procedure, risks, benefits, and alternatives were explained to the patient. Questions regarding the procedure were encouraged and answered. The patient understands and consents to the procedure.  Given the presence of the right-sided breast cancer, the decision was made to place a left anterior chest wall internal jugular approach port a catheter. As such, of the left neck and chest were prepped with chlorhexidine in a sterile fashion, and a sterile drape was applied covering the operative field. Maximum barrier sterile technique with sterile gowns and gloves were used for the procedure. A timeout was performed prior to the initiation of the procedure. Local anesthesia was provided with 1% lidocaine with epinephrine.  After creating a small venotomy incision, a micropuncture kit was utilized to access the internal jugular vein under direct, real-time ultrasound guidance. Ultrasound image documentation was performed. The microwire was kinked to measure appropriate catheter length.  A subcutaneous port pocket was then created along the upper chest wall utilizing a combination of sharp and blunt dissection. The  pocket was irrigated with sterile saline. A single lumen ISP power injectable port was chosen for placement. The 8 Fr catheter was tunneled from the port pocket site to the venotomy incision. The port was placed in the pocket. The external catheter was trimmed to appropriate length. At the venotomy, an 8 Fr peel-away sheath was placed over a guidewire under fluoroscopic guidance. The catheter was then placed through the sheath and the sheath was removed. Final catheter positioning was confirmed and documented with a fluoroscopic spot radiograph. The port was accessed with a Huber needle, aspirated and flushed with heparinized saline.  The venotomy site was closed with an interrupted 4-0 Vicryl suture. The port pocket incision was closed with interrupted 2-0 Vicryl suture and the skin was opposed with a running subcuticular 4-0 Vicryl suture. Dermabond and Steri-strips  were applied to both incisions. Dressings were placed. The patient tolerated the procedure well without immediate post procedural complication.  FINDINGS: After catheter placement, the tip lies within the superior cavoatrial junction. The catheter aspirates and flushes normally and is ready for immediate use.  IMPRESSION: Successful placement of a left internal jugular approach power injectable Port-A-Cath. The catheter is ready for immediate use.   Electronically Signed   By: Sandi Mariscal M.D.   On: 04/20/2014 17:08   US Abdomen Limited Ruq  04/07/2014   CLINICAL DATA:  Elevated hepatic function studies.  EXAM: US ABDOMEN LIMITED - RIGHT UPPER QUADRANT  COMPARISON:  None.  FINDINGS: Gallbladder:  The gallbladder is adequately distended. No calcified stones are evident. . There is no wall thickening or pericholecystic fluid or positive sonographic Murphy's sign.  Common bile duct:  Diameter: 4 mm  Liver:  There are multiple hypoechoic masses with central areas of increased echogenicity throughout the liver. The largest demonstrated is 5.3 x 3.8 x 5.1  cm and lies in the left lobe. There is mild intrahepatic ductal dilation.  IMPRESSION: 1. There is an abnormal appearance of the liver worrisome for multiple masses likely metastatic foci. There is intrahepatic ductal dilation. Contrast-enhanced CT scanning of the abdomen and pelvis is recommended. 2. No definite gallstones are present but there may be echogenic bile or sludge. There is no sonographic evidence of acute cholecystitis.   Electronically Signed   By: David  [place]   On: 04/07/2014 10:27      IMPRESSION: 58 year old with newly diagnosed locally advanced and metastatic right breast cancer  PLAN: She seems to be relatively asymptomatic from her bone metastases. In addition the burden of her disease in the liver is concerning. I recommended to her that we proceed with chemotherapy for now. If her pain becomes a significant issue worth limiting her mobility her quality of life we could treat that at any point. We discussed treatment of the pelvis which seems to be her main side effects and concern. We discussed 3 weeks of treatment as an outpatient. We discussed the process of simulation the placement tattoos. Depending on her response to treatment we also discussed treatment of her right chest wall and lymph nodes if she did have a good response to chemotherapy. I spoke with [name] regarding these recommendations. She was also able to meet with our breast cancer navigator. I spent 60 minutes  face to face with the patient and more than 50% of that time was spent in counseling and/or coordination of care.   ------------------------------------------------  Thea Silversmith, MD

## 2014-04-23 ENCOUNTER — Ambulatory Visit: Payer: Self-pay

## 2014-04-23 ENCOUNTER — Encounter: Payer: Self-pay | Admitting: Hematology & Oncology

## 2014-04-23 ENCOUNTER — Ambulatory Visit (HOSPITAL_BASED_OUTPATIENT_CLINIC_OR_DEPARTMENT_OTHER): Payer: BC Managed Care – PPO

## 2014-04-23 VITALS — BP 145/104 | HR 110 | Temp 97.0°F | Resp 12

## 2014-04-23 DIAGNOSIS — C7952 Secondary malignant neoplasm of bone marrow: Secondary | ICD-10-CM

## 2014-04-23 DIAGNOSIS — Z5112 Encounter for antineoplastic immunotherapy: Secondary | ICD-10-CM

## 2014-04-23 DIAGNOSIS — C7951 Secondary malignant neoplasm of bone: Secondary | ICD-10-CM

## 2014-04-23 DIAGNOSIS — C50919 Malignant neoplasm of unspecified site of unspecified female breast: Secondary | ICD-10-CM

## 2014-04-23 DIAGNOSIS — C50419 Malignant neoplasm of upper-outer quadrant of unspecified female breast: Secondary | ICD-10-CM

## 2014-04-23 MED ORDER — DOCETAXEL CHEMO INJECTION 160 MG/16ML: 75.0000 mg/m2 | Freq: Once | INTRAVENOUS | Status: DC

## 2014-04-23 MED ORDER — PROCHLORPERAZINE MALEATE 10 MG PO TABS: 10.0000 mg | ORAL_TABLET | Freq: Four times a day (QID) | ORAL | Status: DC | PRN

## 2014-04-23 MED ORDER — SODIUM CHLORIDE 0.9 % IV SOLN
Freq: Once | INTRAVENOUS | Status: AC
  Administered 2014-04-23: 13:00:00 via INTRAVENOUS

## 2014-04-23 MED ORDER — ACETAMINOPHEN 325 MG PO TABS
ORAL_TABLET | ORAL | Status: AC
  Filled 2014-04-23: qty 2

## 2014-04-23 MED ORDER — SODIUM CHLORIDE 0.9 % IV SOLN: 600.0000 mg/m2 | Freq: Once | INTRAVENOUS | Status: DC

## 2014-04-23 MED ORDER — ZOLEDRONIC ACID 4 MG/100ML IV SOLN
4.0000 mg | Freq: Once | INTRAVENOUS | Status: AC
  Administered 2014-04-23: 4 mg via INTRAVENOUS
  Filled 2014-04-23: qty 100

## 2014-04-23 MED ORDER — DEXAMETHASONE SODIUM PHOSPHATE 20 MG/5ML IJ SOLN
20.0000 mg | Freq: Once | INTRAMUSCULAR | Status: DC
  Administered 2014-04-23: 20 mg via INTRAVENOUS

## 2014-04-23 MED ORDER — DEXAMETHASONE SODIUM PHOSPHATE 20 MG/5ML IJ SOLN
INTRAMUSCULAR | Status: AC
  Filled 2014-04-23: qty 5

## 2014-04-23 MED ORDER — ONDANSETRON 16 MG/50ML IVPB (CHCC): 16.0000 mg | Freq: Once | INTRAVENOUS | Status: DC

## 2014-04-23 MED ORDER — ONDANSETRON 16 MG/50ML IVPB (CHCC)
16.0000 mg | Freq: Once | INTRAVENOUS | Status: DC
  Administered 2014-04-23: 16 mg via INTRAVENOUS

## 2014-04-23 MED ORDER — SODIUM CHLORIDE 0.9 % IV SOLN
Freq: Once | INTRAVENOUS | Status: AC
  Administered 2014-04-23: 14:00:00 via INTRAVENOUS

## 2014-04-23 MED ORDER — DIPHENHYDRAMINE HCL 25 MG PO CAPS
ORAL_CAPSULE | ORAL | Status: AC
  Filled 2014-04-23: qty 2

## 2014-04-23 MED ORDER — SODIUM CHLORIDE 0.9 % IJ SOLN
10.0000 mL | INTRAMUSCULAR | Status: DC | PRN
  Administered 2014-04-23: 10 mL
  Filled 2014-04-23: qty 10

## 2014-04-23 MED ORDER — HEPARIN SOD (PORK) LOCK FLUSH 100 UNIT/ML IV SOLN
500.0000 [IU] | Freq: Once | INTRAVENOUS | Status: AC | PRN
  Administered 2014-04-23: 500 [IU]
  Filled 2014-04-23: qty 5

## 2014-04-23 MED ORDER — ONDANSETRON HCL 8 MG PO TABS: 8.0000 mg | ORAL_TABLET | Freq: Two times a day (BID) | ORAL | Status: DC

## 2014-04-23 MED ORDER — LORAZEPAM 0.5 MG PO TABS: 0.5000 mg | ORAL_TABLET | Freq: Four times a day (QID) | ORAL | Status: DC | PRN

## 2014-04-23 MED ORDER — DEXAMETHASONE SODIUM PHOSPHATE 20 MG/5ML IJ SOLN: 20.0000 mg | Freq: Once | INTRAMUSCULAR | Status: DC

## 2014-04-23 MED ORDER — ACETAMINOPHEN 325 MG PO TABS
650.0000 mg | ORAL_TABLET | Freq: Once | ORAL | Status: AC
  Administered 2014-04-23: 650 mg via ORAL

## 2014-04-23 MED ORDER — DIPHENHYDRAMINE HCL 25 MG PO CAPS
50.0000 mg | ORAL_CAPSULE | Freq: Once | ORAL | Status: AC
  Administered 2014-04-23: 50 mg via ORAL

## 2014-04-23 MED ORDER — SODIUM CHLORIDE 0.9 % IV SOLN
840.0000 mg | Freq: Once | INTRAVENOUS | Status: AC
  Administered 2014-04-23: 840 mg via INTRAVENOUS
  Filled 2014-04-23: qty 28

## 2014-04-23 MED ORDER — TRASTUZUMAB CHEMO INJECTION 440 MG
8.0000 mg/kg | Freq: Once | INTRAVENOUS | Status: AC
  Administered 2014-04-23: 609 mg via INTRAVENOUS
  Filled 2014-04-23: qty 29

## 2014-04-23 MED ORDER — DEXAMETHASONE 4 MG PO TABS: 8.0000 mg | ORAL_TABLET | Freq: Two times a day (BID) | ORAL | Status: DC

## 2014-04-23 MED ORDER — ONDANSETRON 16 MG/50ML IVPB (CHCC)
INTRAVENOUS | Status: AC
  Filled 2014-04-23: qty 16

## 2014-04-23 NOTE — Patient Instructions (Signed)
Docetaxel injection What is this medicine? DOCETAXEL (doe se TAX el) is a chemotherapy drug. It targets fast dividing cells, like cancer cells, and causes these cells to die. This medicine is used to treat many types of cancers like breast cancer, certain stomach cancers, head and neck cancer, lung cancer, and prostate cancer. This medicine may be used for other purposes; ask your health care provider or pharmacist if you have questions. COMMON BRAND NAME(S): Docefrez , Taxotere What should I tell my health care provider before I take this medicine? They need to know if you have any of these conditions: -infection (especially a virus infection such as chickenpox, cold sores, or herpes) -liver disease -low blood counts, like low white cell, platelet, or red cell counts -an unusual or allergic reaction to docetaxel, polysorbate 80, other chemotherapy agents, other medicines, foods, dyes, or preservatives -pregnant or trying to get pregnant -breast-feeding How should I use this medicine? This drug is given as an infusion into a vein. It is administered in a hospital or clinic by a specially trained health care professional. Talk to your pediatrician regarding the use of this medicine in children. Special care may be needed. Overdosage: If you think you have taken too much of this medicine contact a poison control center or emergency room at once. NOTE: This medicine is only for you. Do not share this medicine with others. What if I miss a dose? It is important not to miss your dose. Call your doctor or health care professional if you are unable to keep an appointment. What may interact with this medicine? -cyclosporine -erythromycin -ketoconazole -medicines to increase blood counts like filgrastim, pegfilgrastim, sargramostim -vaccines Talk to your doctor or health care professional before taking any of these medicines: -acetaminophen -aspirin -ibuprofen -ketoprofen -naproxen This list  may not describe all possible interactions. Give your health care provider a list of all the medicines, herbs, non-prescription drugs, or dietary supplements you use. Also tell them if you smoke, drink alcohol, or use illegal drugs. Some items may interact with your medicine. What should I watch for while using this medicine? Your condition will be monitored carefully while you are receiving this medicine. You will need important blood work done while you are taking this medicine. This drug may make you feel generally unwell. This is not uncommon, as chemotherapy can affect healthy cells as well as cancer cells. Report any side effects. Continue your course of treatment even though you feel ill unless your doctor tells you to stop. In some cases, you may be given additional medicines to help with side effects. Follow all directions for their use. Call your doctor or health care professional for advice if you get a fever, chills or sore throat, or other symptoms of a cold or flu. Do not treat yourself. This drug decreases your body's ability to fight infections. Try to avoid being around people who are sick. This medicine may increase your risk to bruise or bleed. Call your doctor or health care professional if you notice any unusual bleeding. Be careful brushing and flossing your teeth or using a toothpick because you may get an infection or bleed more easily. If you have any dental work done, tell your dentist you are receiving this medicine. Avoid taking products that contain aspirin, acetaminophen, ibuprofen, naproxen, or ketoprofen unless instructed by your doctor. These medicines may hide a fever. Do not become pregnant while taking this medicine. Women should inform their doctor if they wish to become pregnant or think   they might be pregnant. There is a potential for serious side effects to an unborn child. Talk to your health care professional or pharmacist for more information. Do not breast-feed an  infant while taking this medicine. What side effects may I notice from receiving this medicine? Side effects that you should report to your doctor or health care professional as soon as possible: -allergic reactions like skin rash, itching or hives, swelling of the face, lips, or tongue -low blood counts - This drug may decrease the number of white blood cells, red blood cells and platelets. You may be at increased risk for infections and bleeding. -signs of infection - fever or chills, cough, sore throat, pain or difficulty passing urine -signs of decreased platelets or bleeding - bruising, pinpoint red spots on the skin, black, tarry stools, nosebleeds -signs of decreased red blood cells - unusually weak or tired, fainting spells, lightheadedness -breathing problems -fast or irregular heartbeat -low blood pressure -mouth sores -nausea and vomiting -pain, swelling, redness or irritation at the injection site -pain, tingling, numbness in the hands or feet -swelling of the ankle, feet, hands -weight gain Side effects that usually do not require medical attention (report to your prescriber or health care professional if they continue or are bothersome): -bone pain -complete hair loss including hair on your head, underarms, pubic hair, eyebrows, and eyelashes -diarrhea -excessive tearing -changes in the color of fingernails -loosening of the fingernails -nausea -muscle pain -red flush to skin -sweating -weak or tired This list may not describe all possible side effects. Call your doctor for medical advice about side effects. You may report side effects to FDA at 1-800-FDA-1088. Where should I keep my medicine? This drug is given in a hospital or clinic and will not be stored at home. NOTE: This sheet is a summary. It may not cover all possible information. If you have questions about this medicine, talk to your doctor, pharmacist, or health care provider.  2014, [employer] Standard.  (2008-11-26 11:52:10) Cyclophosphamide injection What is this medicine? CYCLOPHOSPHAMIDE (sye kloe FOSS fa mide) is a chemotherapy drug. It slows the growth of cancer cells. This medicine is used to treat many types of cancer like lymphoma, myeloma, leukemia, breast cancer, and ovarian cancer, to name a few. This medicine may be used for other purposes; ask your health care provider or pharmacist if you have questions. COMMON BRAND NAME(S): Cytoxan, Neosar What should I tell my health care provider before I take this medicine? They need to know if you have any of these conditions: -blood disorders -history of other chemotherapy -infection -kidney disease -liver disease -recent or ongoing radiation therapy -tumors in the bone marrow -an unusual or allergic reaction to cyclophosphamide, other chemotherapy, other medicines, foods, dyes, or preservatives -pregnant or trying to get pregnant -breast-feeding How should I use this medicine? This drug is usually given as an injection into a vein or muscle or by infusion into a vein. It is administered in a hospital or clinic by a specially trained health care professional. Talk to your pediatrician regarding the use of this medicine in children. Special care may be needed. Overdosage: If you think you have taken too much of this medicine contact a poison control center or emergency room at once. NOTE: This medicine is only for you. Do not share this medicine with others. What if I miss a dose? It is important not to miss your dose. Call your doctor or health care professional if you are unable to keep an  appointment. What may interact with this medicine? This medicine may interact with the following medications: -amiodarone -amphotericin B -azathioprine -certain antiviral medicines for HIV or AIDS such as protease inhibitors (e.g., indinavir, ritonavir) and zidovudine -certain blood pressure medications such as benazepril, captopril, enalapril,  fosinopril, lisinopril, moexipril, monopril, perindopril, quinapril, ramipril, trandolapril -certain cancer medications such as anthracyclines (e.g., daunorubicin, doxorubicin), busulfan, cytarabine, paclitaxel, pentostatin, tamoxifen, trastuzumab -certain diuretics such as chlorothiazide, chlorthalidone, hydrochlorothiazide, indapamide, metolazone -certain medicines that treat or prevent blood clots like warfarin -certain muscle relaxants such as succinylcholine -cyclosporine -etanercept -indomethacin -medicines to increase blood counts like filgrastim, pegfilgrastim, sargramostim -medicines used as general anesthesia -metronidazole -natalizumab This list may not describe all possible interactions. Give your health care provider a list of all the medicines, herbs, non-prescription drugs, or dietary supplements you use. Also tell them if you smoke, drink alcohol, or use illegal drugs. Some items may interact with your medicine. What should I watch for while using this medicine? Visit your doctor for checks on your progress. This drug may make you feel generally unwell. This is not uncommon, as chemotherapy can affect healthy cells as well as cancer cells. Report any side effects. Continue your course of treatment even though you feel ill unless your doctor tells you to stop. Drink water or other fluids as directed. Urinate often, even at night. In some cases, you may be given additional medicines to help with side effects. Follow all directions for their use. Call your doctor or health care professional for advice if you get a fever, chills or sore throat, or other symptoms of a cold or flu. Do not treat yourself. This drug decreases your body's ability to fight infections. Try to avoid being around people who are sick. This medicine may increase your risk to bruise or bleed. Call your doctor or health care professional if you notice any unusual bleeding. Be careful brushing and flossing your  teeth or using a toothpick because you may get an infection or bleed more easily. If you have any dental work done, tell your dentist you are receiving this medicine. You may get drowsy or dizzy. Do not drive, use machinery, or do anything that needs mental alertness until you know how this medicine affects you. Do not become pregnant while taking this medicine or for 1 year after stopping it. Women should inform their doctor if they wish to become pregnant or think they might be pregnant. Men should not father a child while taking this medicine and for 4 months after stopping it. There is a potential for serious side effects to an unborn child. Talk to your health care professional or pharmacist for more information. Do not breast-feed an infant while taking this medicine. This medicine may interfere with the ability to have a child. This medicine has caused ovarian failure in some women. This medicine has caused reduced sperm counts in some men. You should talk with your doctor or health care professional if you are concerned about your fertility. If you are going to have surgery, tell your doctor or health care professional that you have taken this medicine. What side effects may I notice from receiving this medicine? Side effects that you should report to your doctor or health care professional as soon as possible: -allergic reactions like skin rash, itching or hives, swelling of the face, lips, or tongue -low blood counts - this medicine may decrease the number of white blood cells, red blood cells and platelets. You may be at increased risk  for infections and bleeding. -signs of infection - fever or chills, cough, sore throat, pain or difficulty passing urine -signs of decreased platelets or bleeding - bruising, pinpoint red spots on the skin, black, tarry stools, blood in the urine -signs of decreased red blood cells - unusually weak or tired, fainting spells, lightheadedness -breathing  problems -dark urine -dizziness -palpitations -swelling of the ankles, feet, hands -trouble passing urine or change in the amount of urine -weight gain -yellowing of the eyes or skin Side effects that usually do not require medical attention (report to your doctor or health care professional if they continue or are bothersome): -changes in nail or skin color -hair loss -missed menstrual periods -mouth sores -nausea, vomiting This list may not describe all possible side effects. Call your doctor for medical advice about side effects. You may report side effects to FDA at 1-800-FDA-1088. Where should I keep my medicine? This drug is given in a hospital or clinic and will not be stored at home. NOTE: This sheet is a summary. It may not cover all possible information. If you have questions about this medicine, talk to your doctor, pharmacist, or health care provider.  2014, [employer] Standard. (2012-10-28 16:22:58) Trastuzumab injection for infusion What is this medicine? TRASTUZUMAB (tras TOO zoo mab) is a monoclonal antibody. It targets a protein called HER2. This protein is found in some stomach and breast cancers. This medicine can stop cancer cell growth. This medicine may be used with other cancer treatments. This medicine may be used for other purposes; ask your health care provider or pharmacist if you have questions. COMMON BRAND NAME(S): Herceptin What should I tell my health care provider before I take this medicine? They need to know if you have any of these conditions: -heart disease -heart failure -infection (especially a virus infection such as chickenpox, cold sores, or herpes) -lung or breathing disease, like asthma -recent or ongoing radiation therapy -an unusual or allergic reaction to trastuzumab, benzyl alcohol, or other medications, foods, dyes, or preservatives -pregnant or trying to get pregnant -breast-feeding How should I use this medicine? This drug is  given as an infusion into a vein. It is administered in a hospital or clinic by a specially trained health care professional. Talk to your pediatrician regarding the use of this medicine in children. This medicine is not approved for use in children. Overdosage: If you think you have taken too much of this medicine contact a poison control center or emergency room at once. NOTE: This medicine is only for you. Do not share this medicine with others. What if I miss a dose? It is important not to miss a dose. Call your doctor or health care professional if you are unable to keep an appointment. What may interact with this medicine? -cyclophosphamide -doxorubicin -warfarin This list may not describe all possible interactions. Give your health care provider a list of all the medicines, herbs, non-prescription drugs, or dietary supplements you use. Also tell them if you smoke, drink alcohol, or use illegal drugs. Some items may interact with your medicine. What should I watch for while using this medicine? Visit your doctor for checks on your progress. Report any side effects. Continue your course of treatment even though you feel ill unless your doctor tells you to stop. Call your doctor or health care professional for advice if you get a fever, chills or sore throat, or other symptoms of a cold or flu. Do not treat yourself. Try to avoid being around people  who are sick. You may experience fever, chills and shaking during your first infusion. These effects are usually mild and can be treated with other medicines. Report any side effects during the infusion to your health care professional. Fever and chills usually do not happen with later infusions. What side effects may I notice from receiving this medicine? Side effects that you should report to your doctor or other health care professional as soon as possible: -breathing difficulties -chest pain or palpitations -cough -dizziness or  fainting -fever or chills, sore throat -skin rash, itching or hives -swelling of the legs or ankles -unusually weak or tired Side effects that usually do not require medical attention (report to your doctor or other health care professional if they continue or are bothersome): -loss of appetite -headache -muscle aches -nausea This list may not describe all possible side effects. Call your doctor for medical advice about side effects. You may report side effects to FDA at 1-800-FDA-1088. Where should I keep my medicine? This drug is given in a hospital or clinic and will not be stored at home. NOTE: This sheet is a summary. It may not cover all possible information. If you have questions about this medicine, talk to your doctor, pharmacist, or health care provider.  2014, [employer]/Gold Standard. (2009-10-18 13:43:15) Zoledronic Acid injection (Hypercalcemia, Oncology) What is this medicine? ZOLEDRONIC ACID (ZOE le dron ik AS id) lowers the amount of calcium loss from bone. It is used to treat too much calcium in your blood from cancer. It is also used to prevent complications of cancer that has spread to the bone. This medicine may be used for other purposes; ask your health care provider or pharmacist if you have questions. COMMON BRAND NAME(S): Zometa What should I tell my health care provider before I take this medicine? They need to know if you have any of these conditions: -aspirin-sensitive asthma -cancer, especially if you are receiving medicines used to treat cancer -dental disease or wear dentures -infection -kidney disease -receiving corticosteroids like dexamethasone or prednisone -an unusual or allergic reaction to zoledronic acid, other medicines, foods, dyes, or preservatives -pregnant or trying to get pregnant -breast-feeding How should I use this medicine? This medicine is for infusion into a vein. It is given by a health care professional in a hospital or clinic  setting. Talk to your pediatrician regarding the use of this medicine in children. Special care may be needed. Overdosage: If you think you have taken too much of this medicine contact a poison control center or emergency room at once. NOTE: This medicine is only for you. Do not share this medicine with others. What if I miss a dose? It is important not to miss your dose. Call your doctor or health care professional if you are unable to keep an appointment. What may interact with this medicine? -certain antibiotics given by injection -NSAIDs, medicines for pain and inflammation, like ibuprofen or naproxen -some diuretics like bumetanide, furosemide -teriparatide -thalidomide This list may not describe all possible interactions. Give your health care provider a list of all the medicines, herbs, non-prescription drugs, or dietary supplements you use. Also tell them if you smoke, drink alcohol, or use illegal drugs. Some items may interact with your medicine. What should I watch for while using this medicine? Visit your doctor or health care professional for regular checkups. It may be some time before you see the benefit from this medicine. Do not stop taking your medicine unless your doctor tells you to. Your  doctor may order blood tests or other tests to see how you are doing. Women should inform their doctor if they wish to become pregnant or think they might be pregnant. There is a potential for serious side effects to an unborn child. Talk to your health care professional or pharmacist for more information. You should make sure that you get enough calcium and vitamin D while you are taking this medicine. Discuss the foods you eat and the vitamins you take with your health care professional. Some people who take this medicine have severe bone, joint, and/or muscle pain. This medicine may also increase your risk for jaw problems or a broken thigh bone. Tell your doctor right away if you have severe  pain in your jaw, bones, joints, or muscles. Tell your doctor if you have any pain that does not go away or that gets worse. Tell your dentist and dental surgeon that you are taking this medicine. You should not have major dental surgery while on this medicine. See your dentist to have a dental exam and fix any dental problems before starting this medicine. Take good care of your teeth while on this medicine. Make sure you see your dentist for regular follow-up appointments. What side effects may I notice from receiving this medicine? Side effects that you should report to your doctor or health care professional as soon as possible: -allergic reactions like skin rash, itching or hives, swelling of the face, lips, or tongue -anxiety, confusion, or depression -breathing problems -changes in vision -eye pain -feeling faint or lightheaded, falls -jaw pain, especially after dental work -mouth sores -muscle cramps, stiffness, or weakness -trouble passing urine or change in the amount of urine Side effects that usually do not require medical attention (report to your doctor or health care professional if they continue or are bothersome): -bone, joint, or muscle pain -constipation -diarrhea -fever -hair loss -irritation at site where injected -loss of appetite -nausea, vomiting -stomach upset -trouble sleeping -trouble swallowing -weak or tired This list may not describe all possible side effects. Call your doctor for medical advice about side effects. You may report side effects to FDA at 1-800-FDA-1088. Where should I keep my medicine? This drug is given in a hospital or clinic and will not be stored at home. NOTE: This sheet is a summary. It may not cover all possible information. If you have questions about this medicine, talk to your doctor, pharmacist, or health care provider.  2014, [employer] Standard. (2013-05-25 13:03:13) Pertuzumab injection What is this medicine? PERTUZUMAB  (per TOOZ ue mab) is a monoclonal antibody that targets a protein called HER2. HER2 is found in some breast cancers. This medicine can stop cancer cell growth. This medicine is used with other cancer treatments. This medicine may be used for other purposes; ask your health care provider or pharmacist if you have questions. COMMON BRAND NAME(S): PERJETA What should I tell my health care provider before I take this medicine? They need to know if you have any of these conditions: -heart disease -heart failure -high blood pressure -history of irregular heart beat -recent or ongoing radiation therapy -an unusual or allergic reaction to pertuzumab, other medicines, foods, dyes, or preservatives -pregnant or trying to get pregnant -breast-feeding How should I use this medicine? This medicine is for infusion into a vein. It is given by a health care professional in a hospital or clinic setting. Talk to your pediatrician regarding the use of this medicine in children. Special care may be needed.  Overdosage: If you think you've taken too much of this medicine contact a poison control center or emergency room at once. Overdosage: If you think you have taken too much of this medicine contact a poison control center or emergency room at once. NOTE: This medicine is only for you. Do not share this medicine with others. What if I miss a dose? It is important not to miss your dose. Call your doctor or health care professional if you are unable to keep an appointment. What may interact with this medicine? Interactions are not expected. Give your health care provider a list of all the medicines, herbs, non-prescription drugs, or dietary supplements you use. Also tell them if you smoke, drink alcohol, or use illegal drugs. Some items may interact with your medicine. This list may not describe all possible interactions. Give your health care provider a list of all the medicines, herbs, non-prescription drugs, or  dietary supplements you use. Also tell them if you smoke, drink alcohol, or use illegal drugs. Some items may interact with your medicine. What should I watch for while using this medicine? Your condition will be monitored carefully while you are receiving this medicine. Report any side effects. Continue your course of treatment even though you feel ill unless your doctor tells you to stop. Do not become pregnant while taking this medicine. Women should inform their doctor if they wish to become pregnant or think they might be pregnant. There is a potential for serious side effects to an unborn child. Talk to your health care professional or pharmacist for more information. Do not breast-feed an infant while taking this medicine. Call your doctor or health care professional for advice if you get a fever, chills or sore throat, or other symptoms of a cold or flu. Do not treat yourself. Try to avoid being around people who are sick. You may experience fever, chills, and headache during the infusion. Report any side effects during the infusion to your health care professional. What side effects may I notice from receiving this medicine? Side effects that you should report to your doctor or health care professional as soon as possible: -breathing problems -chest pain or palpitations -dizziness -feeling faint or lightheaded -fever or chills -skin rash, itching or hives -sore throat -swelling of the face, lips, or tongue -swelling of the legs or ankles -unusually weak or tired  Side effects that usually do not require medical attention (Report these to your doctor or health care professional if they continue or are bothersome.): -diarrhea -hair loss -nausea, vomiting -tiredness This list may not describe all possible side effects. Call your doctor for medical advice about side effects. You may report side effects to FDA at 1-800-FDA-1088. Where should I keep my medicine? This drug is given in a  hospital or clinic and will not be stored at home. NOTE: This sheet is a summary. It may not cover all possible information. If you have questions about this medicine, talk to your doctor, pharmacist, or health care provider.  2014, [employer]/Gold Standard. (2012-10-12 16:54:15)

## 2014-04-24 ENCOUNTER — Ambulatory Visit (HOSPITAL_BASED_OUTPATIENT_CLINIC_OR_DEPARTMENT_OTHER): Payer: BC Managed Care – PPO

## 2014-04-24 VITALS — BP 144/94 | HR 95 | Temp 97.1°F | Resp 18

## 2014-04-24 DIAGNOSIS — C7951 Secondary malignant neoplasm of bone: Secondary | ICD-10-CM

## 2014-04-24 DIAGNOSIS — Z5111 Encounter for antineoplastic chemotherapy: Secondary | ICD-10-CM

## 2014-04-24 DIAGNOSIS — C7952 Secondary malignant neoplasm of bone marrow: Secondary | ICD-10-CM

## 2014-04-24 DIAGNOSIS — C50919 Malignant neoplasm of unspecified site of unspecified female breast: Secondary | ICD-10-CM

## 2014-04-24 DIAGNOSIS — C787 Secondary malignant neoplasm of liver and intrahepatic bile duct: Secondary | ICD-10-CM

## 2014-04-24 MED ORDER — SODIUM CHLORIDE 0.9 % IV SOLN
Freq: Once | INTRAVENOUS | Status: AC
  Administered 2014-04-24: 10:00:00 via INTRAVENOUS

## 2014-04-24 MED ORDER — DOCETAXEL CHEMO INJECTION 160 MG/16ML
75.0000 mg/m2 | Freq: Once | INTRAVENOUS | Status: AC
  Administered 2014-04-24: 140 mg via INTRAVENOUS
  Filled 2014-04-24: qty 14

## 2014-04-24 MED ORDER — DEXAMETHASONE SODIUM PHOSPHATE 20 MG/5ML IJ SOLN
20.0000 mg | Freq: Once | INTRAMUSCULAR | Status: AC
  Administered 2014-04-24: 20 mg via INTRAVENOUS

## 2014-04-24 MED ORDER — ONDANSETRON 16 MG/50ML IVPB (CHCC)
INTRAVENOUS | Status: AC
  Filled 2014-04-24: qty 16

## 2014-04-24 MED ORDER — HEPARIN SOD (PORK) LOCK FLUSH 100 UNIT/ML IV SOLN
500.0000 [IU] | Freq: Once | INTRAVENOUS | Status: AC | PRN
  Administered 2014-04-24: 500 [IU]
  Filled 2014-04-24: qty 5

## 2014-04-24 MED ORDER — SODIUM CHLORIDE 0.9 % IJ SOLN
10.0000 mL | INTRAMUSCULAR | Status: DC | PRN
  Administered 2014-04-24: 10 mL
  Filled 2014-04-24: qty 10

## 2014-04-24 MED ORDER — ONDANSETRON 16 MG/50ML IVPB (CHCC)
16.0000 mg | Freq: Once | INTRAVENOUS | Status: AC
  Administered 2014-04-24: 16 mg via INTRAVENOUS

## 2014-04-24 MED ORDER — DEXAMETHASONE SODIUM PHOSPHATE 20 MG/5ML IJ SOLN
INTRAMUSCULAR | Status: AC
  Filled 2014-04-24: qty 5

## 2014-04-24 MED ORDER — SODIUM CHLORIDE 0.9 % IV SOLN
600.0000 mg/m2 | Freq: Once | INTRAVENOUS | Status: AC
  Administered 2014-04-24: 1120 mg via INTRAVENOUS
  Filled 2014-04-24: qty 56

## 2014-04-24 NOTE — Patient Instructions (Signed)
[place] [facility] Discharge Instructions for Patients Receiving Chemotherapy  Today you received the following chemotherapy agents TAXOTERE AND CYTOXAN.   To help prevent nausea and vomiting after your treatment, we encourage you to take your nausea medication as prescribed. See Below:   DECADRON 4MG : TAKE 2 PILLS TWICE A DAY. TAKE WED-FRID.   ZOFRAN 8MG : TAKE 1 TAB TWICE A DAY. TAKE WED-FRID. YOU MAY THEN TAKE THIS PILL TWICE A DAY AS NEEDED FOR NAUSEA AND VOMITING. (Take 1st)  COMPAZINE 10MG : TAKE 1 TAB EVERY 6HRS AS NEEDED FOR NAUSEA AND VOMITING.(Take 2nd)   ATIVAN 10MG : TAKE 1 TAB EVERY 6HRS AS NEEDED FOR NAUSEA AND VOMITING THAT IS NOT RELIEVED BY THE ZOFRAN AND/OR THE COMPAZINE. (Take 3rd)    If you develop nausea and vomiting that is not controlled by your nausea medication, call the clinic.   BELOW ARE SYMPTOMS THAT SHOULD BE REPORTED IMMEDIATELY:  *FEVER GREATER THAN 100.5 F  *CHILLS WITH OR WITHOUT FEVER  NAUSEA AND VOMITING THAT IS NOT CONTROLLED WITH YOUR NAUSEA MEDICATION  *UNUSUAL SHORTNESS OF BREATH  *UNUSUAL BRUISING OR BLEEDING  TENDERNESS IN MOUTH AND THROAT WITH OR WITHOUT PRESENCE OF ULCERS  *URINARY PROBLEMS  *BOWEL PROBLEMS  UNUSUAL RASH Items with * indicate a potential emergency and should be followed up as soon as possible.  Feel free to call the clinic you have any questions or concerns. The clinic phone number is [phone].

## 2014-04-25 ENCOUNTER — Encounter: Payer: Self-pay | Admitting: *Deleted

## 2014-04-25 ENCOUNTER — Ambulatory Visit (HOSPITAL_BASED_OUTPATIENT_CLINIC_OR_DEPARTMENT_OTHER): Payer: BC Managed Care – PPO

## 2014-04-25 DIAGNOSIS — Z5189 Encounter for other specified aftercare: Secondary | ICD-10-CM

## 2014-04-25 DIAGNOSIS — C50919 Malignant neoplasm of unspecified site of unspecified female breast: Secondary | ICD-10-CM

## 2014-04-25 MED ORDER — PEGFILGRASTIM INJECTION 6 MG/0.6ML
6.0000 mg | Freq: Once | SUBCUTANEOUS | Status: AC
  Administered 2014-04-25: 6 mg via SUBCUTANEOUS

## 2014-04-25 MED ORDER — PEGFILGRASTIM INJECTION 6 MG/0.6ML
SUBCUTANEOUS | Status: AC
  Filled 2014-04-25: qty 0.6

## 2014-04-25 NOTE — Patient Instructions (Signed)

## 2014-04-25 NOTE — Progress Notes (Signed)
[facility] Psychosocial Distress Screening Clinical Social Work  Clinical Social Work was referred by distress screening protocol.  The patient scored a 5 on the Psychosocial Distress Thermometer which indicates moderate distress. Clinical Social Worker phoned pt to assess for distress and other psychosocial needs. CSW had attempted to phone pt last week, but she was having a procedure done. Pt available today and reports to be coping adequately at this time. CSW discussed resources available to assist at the [facility] and pt may follow up in the future. Pt denied additional needs currently, but agrees to seek out CSW as needed.   ONCBCN DISTRESS SCREENING 04/19/2014  Screening Type Initial Screening  Elta [place] the number that describes how much distress you have been experiencing in the past week 5  Practical problem type Food  Emotional problem type Nervousness/Anxiety;Adjusting to illness  Spiritual/Religous concerns type Relating to God  Physical Problem type Pain;Sleep/insomnia;Loss of appetitie   Clinical Social Worker follow up needed: no  Loren Racer, LCSW Clinical Social Worker [facility] [place] Wednesday, Thursday and Friday Phone: [phone] Fax: [phone]

## 2014-04-27 ENCOUNTER — Telehealth: Payer: Self-pay | Admitting: Dietician

## 2014-04-27 NOTE — Telephone Encounter (Signed)
Brief Outpatient Oncology Nutrition Note  Patient has been identified to be at risk on malnutrition screen.  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  Patient of [name].  Called patient secondary to continued weight loss.  Patient reports diarrhea after chemo treatment.  Eating only small amounts.  Patient known to this RD from hospitalization 04/06/14.  Patient had not been eating well at that time and for 2 weeks prior to that admit.  She was diagnosed with malnutrition related to acute illness as evidenced by weight loss and poor oral intake at that time.    Discussed priority to maintain adequate fluid intake to maintain hydration.  Discussed diet tips regarding this.  Option of Ensure Active (clear liquid supplement) or other protein shake as tolerated.    Will mail patient tips on nutrition and diarrhea, making each bite count, coupons, and contact information for the Outpatient Cancer RD.  Antonieta Iba, RD, LDN

## 2014-04-30 ENCOUNTER — Telehealth: Payer: Self-pay | Admitting: *Deleted

## 2014-04-30 NOTE — Telephone Encounter (Signed)
Daughter called questioning whether or not pt can have visitors. Informed pt that visitors are okay as long as they aren't sick.

## 2014-05-02 ENCOUNTER — Telehealth: Payer: Self-pay | Admitting: Hematology & Oncology

## 2014-05-02 NOTE — Telephone Encounter (Signed)
Pt has been APPROVED for the [place] Program for her XGEVA & NEULASTA.   Mary [name] Nbr: [id number] Dates: 05/02/2014 - 05/03/2015    Mary [name] Nbr: [id number] Dates: 05/02/2014 - 05/03/2015     COPY SCANNED

## 2014-05-02 NOTE — Telephone Encounter (Signed)
[employer] coverage efc 03/27/2014 - 12/27/2014  NPR required for chemo infusion: J7050 - PR NORMAL SALINE SOLUTION INFUS J2405 - PR ONDANSETRON HCL INJECTION J1100 - PR DEXAMETHASONE SODIUM PHOS J9171 - PR DOCETAXEL INJECTION J7042 - PR 5% DEXTROSE/NORMAL SALINE J9070 - PR CYCLOPHOSPHAMIDE 100 MG INJ J1642 - PR INJ HEPARIN SODIUM PER 10 U A4216 - PR STERILE WATER/SALINE, 10 ML J2505 - PR INJECTION, PEGFILGRASTIM 6MG  Q0163 - PR DIPHENHYDRAMINE HCL 50MG  J2505 - PR INJECTION, PEGFILGRASTIM 6MG  J9355 - PR TRASTUZUMAB J9306 - PR INJECTION, PERTUZUMAB, 1 MG J3489 - PR ZOLEDRONIC ACID 1MG  A4216 - PR STERILE WATER/SALINE, 10 ML J1642 - PR INJ HEPARIN SODIUM PER 10 U

## 2014-05-14 ENCOUNTER — Other Ambulatory Visit (HOSPITAL_BASED_OUTPATIENT_CLINIC_OR_DEPARTMENT_OTHER): Payer: BC Managed Care – PPO | Admitting: Lab

## 2014-05-14 ENCOUNTER — Ambulatory Visit (HOSPITAL_BASED_OUTPATIENT_CLINIC_OR_DEPARTMENT_OTHER): Payer: BC Managed Care – PPO

## 2014-05-14 ENCOUNTER — Encounter: Payer: Self-pay | Admitting: Hematology & Oncology

## 2014-05-14 ENCOUNTER — Ambulatory Visit (HOSPITAL_BASED_OUTPATIENT_CLINIC_OR_DEPARTMENT_OTHER): Payer: BC Managed Care – PPO | Admitting: Hematology & Oncology

## 2014-05-14 VITALS — BP 145/90 | HR 94 | Temp 98.2°F | Resp 18 | Ht 64.0 in | Wt 153.0 lb

## 2014-05-14 DIAGNOSIS — M899 Disorder of bone, unspecified: Secondary | ICD-10-CM | POA: Diagnosis not present

## 2014-05-14 DIAGNOSIS — R634 Abnormal weight loss: Secondary | ICD-10-CM

## 2014-05-14 DIAGNOSIS — C50919 Malignant neoplasm of unspecified site of unspecified female breast: Secondary | ICD-10-CM

## 2014-05-14 DIAGNOSIS — C787 Secondary malignant neoplasm of liver and intrahepatic bile duct: Secondary | ICD-10-CM

## 2014-05-14 DIAGNOSIS — R109 Unspecified abdominal pain: Secondary | ICD-10-CM

## 2014-05-14 DIAGNOSIS — Z5111 Encounter for antineoplastic chemotherapy: Secondary | ICD-10-CM

## 2014-05-14 DIAGNOSIS — M949 Disorder of cartilage, unspecified: Secondary | ICD-10-CM

## 2014-05-14 DIAGNOSIS — Z5112 Encounter for antineoplastic immunotherapy: Secondary | ICD-10-CM

## 2014-05-14 LAB — CMP (CANCER CENTER ONLY)
ALT(SGPT): 25 U/L (ref 10–47)
AST: 178 U/L — ABNORMAL HIGH (ref 11–38)
Albumin: 3.3 g/dL (ref 3.3–5.5)
Alkaline Phosphatase: 208 U/L — ABNORMAL HIGH (ref 26–84)
BILIRUBIN TOTAL: 0.9 mg/dL (ref 0.20–1.60)
BUN, Bld: 11 mg/dL (ref 7–22)
CO2: 22 meq/L (ref 18–33)
Calcium: 9.2 mg/dL (ref 8.0–10.3)
Chloride: 103 mEq/L (ref 98–108)
Creat: 0.8 mg/dl (ref 0.6–1.2)
Glucose, Bld: 144 mg/dL — ABNORMAL HIGH (ref 73–118)
Potassium: 3.7 mEq/L (ref 3.3–4.7)
Sodium: 142 mEq/L (ref 128–145)
Total Protein: 7.8 g/dL (ref 6.4–8.1)

## 2014-05-14 LAB — CBC WITH DIFFERENTIAL (CANCER CENTER ONLY)
BASO#: 0 10*3/uL (ref 0.0–0.2)
BASO%: 0.2 % (ref 0.0–2.0)
EOS%: 0 % (ref 0.0–7.0)
Eosinophils Absolute: 0 10*3/uL (ref 0.0–0.5)
HEMATOCRIT: 30.2 % — AB (ref 34.8–46.6)
HGB: 9.8 g/dL — ABNORMAL LOW (ref 11.6–15.9)
LYMPH#: 0.7 10*3/uL — ABNORMAL LOW (ref 0.9–3.3)
LYMPH%: 6.9 % — ABNORMAL LOW (ref 14.0–48.0)
MCH: 30.2 pg (ref 26.0–34.0)
MCHC: 32.5 g/dL (ref 32.0–36.0)
MCV: 93 fL (ref 81–101)
MONO#: 0.2 10*3/uL (ref 0.1–0.9)
MONO%: 1.5 % (ref 0.0–13.0)
NEUT#: 9.5 10*3/uL — ABNORMAL HIGH (ref 1.5–6.5)
NEUT%: 91.4 % — ABNORMAL HIGH (ref 39.6–80.0)
PLATELETS: 631 10*3/uL — AB (ref 145–400)
RBC: 3.25 10*6/uL — AB (ref 3.70–5.32)
RDW: 18.1 % — ABNORMAL HIGH (ref 11.1–15.7)
WBC: 10.4 10*3/uL — ABNORMAL HIGH (ref 3.9–10.0)

## 2014-05-14 LAB — CANCER ANTIGEN 27.29: CA 27.29: 3293 U/mL — AB (ref 0–39)

## 2014-05-14 MED ORDER — HEPARIN SOD (PORK) LOCK FLUSH 100 UNIT/ML IV SOLN
250.0000 [IU] | Freq: Once | INTRAVENOUS | Status: DC | PRN
  Filled 2014-05-14: qty 5

## 2014-05-14 MED ORDER — SODIUM CHLORIDE 0.9 % IV SOLN
420.0000 mg | Freq: Once | INTRAVENOUS | Status: AC
  Administered 2014-05-14: 420 mg via INTRAVENOUS
  Filled 2014-05-14: qty 14

## 2014-05-14 MED ORDER — DEXAMETHASONE SODIUM PHOSPHATE 20 MG/5ML IJ SOLN
INTRAMUSCULAR | Status: AC
  Filled 2014-05-14: qty 5

## 2014-05-14 MED ORDER — HEPARIN SOD (PORK) LOCK FLUSH 100 UNIT/ML IV SOLN
500.0000 [IU] | Freq: Once | INTRAVENOUS | Status: AC | PRN
  Administered 2014-05-14: 500 [IU]
  Filled 2014-05-14: qty 5

## 2014-05-14 MED ORDER — SODIUM CHLORIDE 0.9 % IV SOLN
420.0000 mg | Freq: Once | INTRAVENOUS | Status: AC
  Administered 2014-05-14: 420 mg via INTRAVENOUS
  Filled 2014-05-14: qty 20

## 2014-05-14 MED ORDER — ONDANSETRON 16 MG/50ML IVPB (CHCC)
INTRAVENOUS | Status: AC
  Filled 2014-05-14: qty 16

## 2014-05-14 MED ORDER — DIPHENHYDRAMINE HCL 25 MG PO CAPS
50.0000 mg | ORAL_CAPSULE | Freq: Once | ORAL | Status: AC
  Administered 2014-05-14: 50 mg via ORAL

## 2014-05-14 MED ORDER — DEXAMETHASONE SODIUM PHOSPHATE 20 MG/5ML IJ SOLN
20.0000 mg | Freq: Once | INTRAMUSCULAR | Status: AC
  Administered 2014-05-14: 20 mg via INTRAVENOUS

## 2014-05-14 MED ORDER — DIPHENHYDRAMINE HCL 25 MG PO CAPS
ORAL_CAPSULE | ORAL | Status: AC
  Filled 2014-05-14: qty 2

## 2014-05-14 MED ORDER — SODIUM CHLORIDE 0.9 % IJ SOLN
3.0000 mL | INTRAMUSCULAR | Status: DC | PRN
  Filled 2014-05-14: qty 10

## 2014-05-14 MED ORDER — SODIUM CHLORIDE 0.9 % IV SOLN
600.0000 mg/m2 | Freq: Once | INTRAVENOUS | Status: AC
  Administered 2014-05-14: 1120 mg via INTRAVENOUS
  Filled 2014-05-14: qty 56

## 2014-05-14 MED ORDER — ALTEPLASE 2 MG IJ SOLR
2.0000 mg | Freq: Once | INTRAMUSCULAR | Status: DC | PRN
  Filled 2014-05-14: qty 2

## 2014-05-14 MED ORDER — ONDANSETRON 16 MG/50ML IVPB (CHCC)
16.0000 mg | Freq: Once | INTRAVENOUS | Status: AC
  Administered 2014-05-14: 16 mg via INTRAVENOUS

## 2014-05-14 MED ORDER — ACETAMINOPHEN 325 MG PO TABS
650.0000 mg | ORAL_TABLET | Freq: Once | ORAL | Status: AC
  Administered 2014-05-14: 650 mg via ORAL

## 2014-05-14 MED ORDER — SODIUM CHLORIDE 0.9 % IV SOLN
Freq: Once | INTRAVENOUS | Status: AC
  Administered 2014-05-14: 10:00:00 via INTRAVENOUS

## 2014-05-14 MED ORDER — SODIUM CHLORIDE 0.9 % IJ SOLN
10.0000 mL | INTRAMUSCULAR | Status: DC | PRN
  Administered 2014-05-14: 10 mL
  Filled 2014-05-14: qty 10

## 2014-05-14 MED ORDER — ACETAMINOPHEN 325 MG PO TABS
ORAL_TABLET | ORAL | Status: AC
  Filled 2014-05-14: qty 2

## 2014-05-14 MED ORDER — DOCETAXEL CHEMO INJECTION 160 MG/16ML
75.0000 mg/m2 | Freq: Once | INTRAVENOUS | Status: AC
  Administered 2014-05-14: 140 mg via INTRAVENOUS
  Filled 2014-05-14: qty 14

## 2014-05-14 NOTE — Patient Instructions (Signed)
Docetaxel injection What is this medicine? DOCETAXEL (doe se TAX el) is a chemotherapy drug. It targets fast dividing cells, like cancer cells, and causes these cells to die. This medicine is used to treat many types of cancers like breast cancer, certain stomach cancers, head and neck cancer, lung cancer, and prostate cancer. This medicine may be used for other purposes; ask your health care provider or pharmacist if you have questions. COMMON BRAND NAME(S): Docefrez , Taxotere What should I tell my health care provider before I take this medicine? They need to know if you have any of these conditions: -infection (especially a virus infection such as chickenpox, cold sores, or herpes) -liver disease -low blood counts, like low white cell, platelet, or red cell counts -an unusual or allergic reaction to docetaxel, polysorbate 80, other chemotherapy agents, other medicines, foods, dyes, or preservatives -pregnant or trying to get pregnant -breast-feeding How should I use this medicine? This drug is given as an infusion into a vein. It is administered in a hospital or clinic by a specially trained health care professional. Talk to your pediatrician regarding the use of this medicine in children. Special care may be needed. Overdosage: If you think you have taken too much of this medicine contact a poison control center or emergency room at once. NOTE: This medicine is only for you. Do not share this medicine with others. What if I miss a dose? It is important not to miss your dose. Call your doctor or health care professional if you are unable to keep an appointment. What may interact with this medicine? -cyclosporine -erythromycin -ketoconazole -medicines to increase blood counts like filgrastim, pegfilgrastim, sargramostim -vaccines Talk to your doctor or health care professional before taking any of these medicines: -acetaminophen -aspirin -ibuprofen -ketoprofen -naproxen This list  may not describe all possible interactions. Give your health care provider a list of all the medicines, herbs, non-prescription drugs, or dietary supplements you use. Also tell them if you smoke, drink alcohol, or use illegal drugs. Some items may interact with your medicine. What should I watch for while using this medicine? Your condition will be monitored carefully while you are receiving this medicine. You will need important blood work done while you are taking this medicine. This drug may make you feel generally unwell. This is not uncommon, as chemotherapy can affect healthy cells as well as cancer cells. Report any side effects. Continue your course of treatment even though you feel ill unless your doctor tells you to stop. In some cases, you may be given additional medicines to help with side effects. Follow all directions for their use. Call your doctor or health care professional for advice if you get a fever, chills or sore throat, or other symptoms of a cold or flu. Do not treat yourself. This drug decreases your body's ability to fight infections. Try to avoid being around people who are sick. This medicine may increase your risk to bruise or bleed. Call your doctor or health care professional if you notice any unusual bleeding. Be careful brushing and flossing your teeth or using a toothpick because you may get an infection or bleed more easily. If you have any dental work done, tell your dentist you are receiving this medicine. Avoid taking products that contain aspirin, acetaminophen, ibuprofen, naproxen, or ketoprofen unless instructed by your doctor. These medicines may hide a fever. Do not become pregnant while taking this medicine. Women should inform their doctor if they wish to become pregnant or think   they might be pregnant. There is a potential for serious side effects to an unborn child. Talk to your health care professional or pharmacist for more information. Do not breast-feed an  infant while taking this medicine. What side effects may I notice from receiving this medicine? Side effects that you should report to your doctor or health care professional as soon as possible: -allergic reactions like skin rash, itching or hives, swelling of the face, lips, or tongue -low blood counts - This drug may decrease the number of white blood cells, red blood cells and platelets. You may be at increased risk for infections and bleeding. -signs of infection - fever or chills, cough, sore throat, pain or difficulty passing urine -signs of decreased platelets or bleeding - bruising, pinpoint red spots on the skin, black, tarry stools, nosebleeds -signs of decreased red blood cells - unusually weak or tired, fainting spells, lightheadedness -breathing problems -fast or irregular heartbeat -low blood pressure -mouth sores -nausea and vomiting -pain, swelling, redness or irritation at the injection site -pain, tingling, numbness in the hands or feet -swelling of the ankle, feet, hands -weight gain Side effects that usually do not require medical attention (report to your prescriber or health care professional if they continue or are bothersome): -bone pain -complete hair loss including hair on your head, underarms, pubic hair, eyebrows, and eyelashes -diarrhea -excessive tearing -changes in the color of fingernails -loosening of the fingernails -nausea -muscle pain -red flush to skin -sweating -weak or tired This list may not describe all possible side effects. Call your doctor for medical advice about side effects. You may report side effects to FDA at 1-800-FDA-1088. Where should I keep my medicine? This drug is given in a hospital or clinic and will not be stored at home. NOTE: This sheet is a summary. It may not cover all possible information. If you have questions about this medicine, talk to your doctor, pharmacist, or health care provider.  2014, [employer].  (2008-11-26 11:52:10) Cyclophosphamide injection What is this medicine? CYCLOPHOSPHAMIDE (sye kloe FOSS fa mide) is a chemotherapy drug. It slows the growth of cancer cells. This medicine is used to treat many types of cancer like lymphoma, myeloma, leukemia, breast cancer, and ovarian cancer, to name a few. This medicine may be used for other purposes; ask your health care provider or pharmacist if you have questions. COMMON BRAND NAME(S): Cytoxan, Neosar What should I tell my health care provider before I take this medicine? They need to know if you have any of these conditions: -blood disorders -history of other chemotherapy -infection -kidney disease -liver disease -recent or ongoing radiation therapy -tumors in the bone marrow -an unusual or allergic reaction to cyclophosphamide, other chemotherapy, other medicines, foods, dyes, or preservatives -pregnant or trying to get pregnant -breast-feeding How should I use this medicine? This drug is usually given as an injection into a vein or muscle or by infusion into a vein. It is administered in a hospital or clinic by a specially trained health care professional. Talk to your pediatrician regarding the use of this medicine in children. Special care may be needed. Overdosage: If you think you have taken too much of this medicine contact a poison control center or emergency room at once. NOTE: This medicine is only for you. Do not share this medicine with others. What if I miss a dose? It is important not to miss your dose. Call your doctor or health care professional if you are unable to keep an  appointment. What may interact with this medicine? This medicine may interact with the following medications: -amiodarone -amphotericin B -azathioprine -certain antiviral medicines for HIV or AIDS such as protease inhibitors (e.g., indinavir, ritonavir) and zidovudine -certain blood pressure medications such as benazepril, captopril, enalapril,  fosinopril, lisinopril, moexipril, monopril, perindopril, quinapril, ramipril, trandolapril -certain cancer medications such as anthracyclines (e.g., daunorubicin, doxorubicin), busulfan, cytarabine, paclitaxel, pentostatin, tamoxifen, trastuzumab -certain diuretics such as chlorothiazide, chlorthalidone, hydrochlorothiazide, indapamide, metolazone -certain medicines that treat or prevent blood clots like warfarin -certain muscle relaxants such as succinylcholine -cyclosporine -etanercept -indomethacin -medicines to increase blood counts like filgrastim, pegfilgrastim, sargramostim -medicines used as general anesthesia -metronidazole -natalizumab This list may not describe all possible interactions. Give your health care provider a list of all the medicines, herbs, non-prescription drugs, or dietary supplements you use. Also tell them if you smoke, drink alcohol, or use illegal drugs. Some items may interact with your medicine. What should I watch for while using this medicine? Visit your doctor for checks on your progress. This drug may make you feel generally unwell. This is not uncommon, as chemotherapy can affect healthy cells as well as cancer cells. Report any side effects. Continue your course of treatment even though you feel ill unless your doctor tells you to stop. Drink water or other fluids as directed. Urinate often, even at night. In some cases, you may be given additional medicines to help with side effects. Follow all directions for their use. Call your doctor or health care professional for advice if you get a fever, chills or sore throat, or other symptoms of a cold or flu. Do not treat yourself. This drug decreases your body's ability to fight infections. Try to avoid being around people who are sick. This medicine may increase your risk to bruise or bleed. Call your doctor or health care professional if you notice any unusual bleeding. Be careful brushing and flossing your  teeth or using a toothpick because you may get an infection or bleed more easily. If you have any dental work done, tell your dentist you are receiving this medicine. You may get drowsy or dizzy. Do not drive, use machinery, or do anything that needs mental alertness until you know how this medicine affects you. Do not become pregnant while taking this medicine or for 1 year after stopping it. Women should inform their doctor if they wish to become pregnant or think they might be pregnant. Men should not father a child while taking this medicine and for 4 months after stopping it. There is a potential for serious side effects to an unborn child. Talk to your health care professional or pharmacist for more information. Do not breast-feed an infant while taking this medicine. This medicine may interfere with the ability to have a child. This medicine has caused ovarian failure in some women. This medicine has caused reduced sperm counts in some men. You should talk with your doctor or health care professional if you are concerned about your fertility. If you are going to have surgery, tell your doctor or health care professional that you have taken this medicine. What side effects may I notice from receiving this medicine? Side effects that you should report to your doctor or health care professional as soon as possible: -allergic reactions like skin rash, itching or hives, swelling of the face, lips, or tongue -low blood counts - this medicine may decrease the number of white blood cells, red blood cells and platelets. You may be at increased risk  for infections and bleeding. -signs of infection - fever or chills, cough, sore throat, pain or difficulty passing urine -signs of decreased platelets or bleeding - bruising, pinpoint red spots on the skin, black, tarry stools, blood in the urine -signs of decreased red blood cells - unusually weak or tired, fainting spells, lightheadedness -breathing  problems -dark urine -dizziness -palpitations -swelling of the ankles, feet, hands -trouble passing urine or change in the amount of urine -weight gain -yellowing of the eyes or skin Side effects that usually do not require medical attention (report to your doctor or health care professional if they continue or are bothersome): -changes in nail or skin color -hair loss -missed menstrual periods -mouth sores -nausea, vomiting This list may not describe all possible side effects. Call your doctor for medical advice about side effects. You may report side effects to FDA at 1-800-FDA-1088. Where should I keep my medicine? This drug is given in a hospital or clinic and will not be stored at home. NOTE: This sheet is a summary. It may not cover all possible information. If you have questions about this medicine, talk to your doctor, pharmacist, or health care provider.  2014, [employer] Standard. (2012-10-28 16:22:58) Trastuzumab injection for infusion What is this medicine? TRASTUZUMAB (tras TOO zoo mab) is a monoclonal antibody. It targets a protein called HER2. This protein is found in some stomach and breast cancers. This medicine can stop cancer cell growth. This medicine may be used with other cancer treatments. This medicine may be used for other purposes; ask your health care provider or pharmacist if you have questions. COMMON BRAND NAME(S): Herceptin What should I tell my health care provider before I take this medicine? They need to know if you have any of these conditions: -heart disease -heart failure -infection (especially a virus infection such as chickenpox, cold sores, or herpes) -lung or breathing disease, like asthma -recent or ongoing radiation therapy -an unusual or allergic reaction to trastuzumab, benzyl alcohol, or other medications, foods, dyes, or preservatives -pregnant or trying to get pregnant -breast-feeding How should I use this medicine? This drug is  given as an infusion into a vein. It is administered in a hospital or clinic by a specially trained health care professional. Talk to your pediatrician regarding the use of this medicine in children. This medicine is not approved for use in children. Overdosage: If you think you have taken too much of this medicine contact a poison control center or emergency room at once. NOTE: This medicine is only for you. Do not share this medicine with others. What if I miss a dose? It is important not to miss a dose. Call your doctor or health care professional if you are unable to keep an appointment. What may interact with this medicine? -cyclophosphamide -doxorubicin -warfarin This list may not describe all possible interactions. Give your health care provider a list of all the medicines, herbs, non-prescription drugs, or dietary supplements you use. Also tell them if you smoke, drink alcohol, or use illegal drugs. Some items may interact with your medicine. What should I watch for while using this medicine? Visit your doctor for checks on your progress. Report any side effects. Continue your course of treatment even though you feel ill unless your doctor tells you to stop. Call your doctor or health care professional for advice if you get a fever, chills or sore throat, or other symptoms of a cold or flu. Do not treat yourself. Try to avoid being around people  who are sick. You may experience fever, chills and shaking during your first infusion. These effects are usually mild and can be treated with other medicines. Report any side effects during the infusion to your health care professional. Fever and chills usually do not happen with later infusions. What side effects may I notice from receiving this medicine? Side effects that you should report to your doctor or other health care professional as soon as possible: -breathing difficulties -chest pain or palpitations -cough -dizziness or  fainting -fever or chills, sore throat -skin rash, itching or hives -swelling of the legs or ankles -unusually weak or tired Side effects that usually do not require medical attention (report to your doctor or other health care professional if they continue or are bothersome): -loss of appetite -headache -muscle aches -nausea This list may not describe all possible side effects. Call your doctor for medical advice about side effects. You may report side effects to FDA at 1-800-FDA-1088. Where should I keep my medicine? This drug is given in a hospital or clinic and will not be stored at home. NOTE: This sheet is a summary. It may not cover all possible information. If you have questions about this medicine, talk to your doctor, pharmacist, or health care provider.  2014, [employer] Standard. (2009-10-18 13:43:15) Pertuzumab injection What is this medicine? PERTUZUMAB (per TOOZ ue mab) is a monoclonal antibody that targets a protein called HER2. HER2 is found in some breast cancers. This medicine can stop cancer cell growth. This medicine is used with other cancer treatments. This medicine may be used for other purposes; ask your health care provider or pharmacist if you have questions. COMMON BRAND NAME(S): PERJETA What should I tell my health care provider before I take this medicine? They need to know if you have any of these conditions: -heart disease -heart failure -high blood pressure -history of irregular heart beat -recent or ongoing radiation therapy -an unusual or allergic reaction to pertuzumab, other medicines, foods, dyes, or preservatives -pregnant or trying to get pregnant -breast-feeding How should I use this medicine? This medicine is for infusion into a vein. It is given by a health care professional in a hospital or clinic setting. Talk to your pediatrician regarding the use of this medicine in children. Special care may be needed. Overdosage: If you think  you've taken too much of this medicine contact a poison control center or emergency room at once. Overdosage: If you think you have taken too much of this medicine contact a poison control center or emergency room at once. NOTE: This medicine is only for you. Do not share this medicine with others. What if I miss a dose? It is important not to miss your dose. Call your doctor or health care professional if you are unable to keep an appointment. What may interact with this medicine? Interactions are not expected. Give your health care provider a list of all the medicines, herbs, non-prescription drugs, or dietary supplements you use. Also tell them if you smoke, drink alcohol, or use illegal drugs. Some items may interact with your medicine. This list may not describe all possible interactions. Give your health care provider a list of all the medicines, herbs, non-prescription drugs, or dietary supplements you use. Also tell them if you smoke, drink alcohol, or use illegal drugs. Some items may interact with your medicine. What should I watch for while using this medicine? Your condition will be monitored carefully while you are receiving this medicine. Report any side effects.  Continue your course of treatment even though you feel ill unless your doctor tells you to stop. Do not become pregnant while taking this medicine. Women should inform their doctor if they wish to become pregnant or think they might be pregnant. There is a potential for serious side effects to an unborn child. Talk to your health care professional or pharmacist for more information. Do not breast-feed an infant while taking this medicine. Call your doctor or health care professional for advice if you get a fever, chills or sore throat, or other symptoms of a cold or flu. Do not treat yourself. Try to avoid being around people who are sick. You may experience fever, chills, and headache during the infusion. Report any side effects  during the infusion to your health care professional. What side effects may I notice from receiving this medicine? Side effects that you should report to your doctor or health care professional as soon as possible: -breathing problems -chest pain or palpitations -dizziness -feeling faint or lightheaded -fever or chills -skin rash, itching or hives -sore throat -swelling of the face, lips, or tongue -swelling of the legs or ankles -unusually weak or tired  Side effects that usually do not require medical attention (Report these to your doctor or health care professional if they continue or are bothersome.): -diarrhea -hair loss -nausea, vomiting -tiredness This list may not describe all possible side effects. Call your doctor for medical advice about side effects. You may report side effects to FDA at 1-800-FDA-1088. Where should I keep my medicine? This drug is given in a hospital or clinic and will not be stored at home. NOTE: This sheet is a summary. It may not cover all possible information. If you have questions about this medicine, talk to your doctor, pharmacist, or health care provider.  2014, [employer] Standard. (2012-10-12 16:54:15)

## 2014-05-15 ENCOUNTER — Telehealth: Payer: Self-pay | Admitting: Hematology & Oncology

## 2014-05-15 ENCOUNTER — Ambulatory Visit (HOSPITAL_BASED_OUTPATIENT_CLINIC_OR_DEPARTMENT_OTHER): Payer: BC Managed Care – PPO

## 2014-05-15 ENCOUNTER — Ambulatory Visit: Payer: Self-pay

## 2014-05-15 VITALS — BP 122/70 | HR 98 | Temp 98.2°F

## 2014-05-15 DIAGNOSIS — Z5189 Encounter for other specified aftercare: Secondary | ICD-10-CM

## 2014-05-15 DIAGNOSIS — C50919 Malignant neoplasm of unspecified site of unspecified female breast: Secondary | ICD-10-CM

## 2014-05-15 MED ORDER — PEGFILGRASTIM INJECTION 6 MG/0.6ML
6.0000 mg | Freq: Once | SUBCUTANEOUS | Status: AC
  Administered 2014-05-15: 6 mg via SUBCUTANEOUS
  Filled 2014-05-15: qty 0.6

## 2014-05-15 NOTE — Telephone Encounter (Signed)
Pt in ofc today to complete an AUTHORIZATION FOR RELEASE/REQUEST OF MEDICAL INFORMATION form, for release of medical records for  Medicare and Medicaid claim assistance.      COPY SCANNED

## 2014-05-15 NOTE — Progress Notes (Signed)
Hematology and Oncology Follow Up Visit  Mary [name] [id number] February 25, 1956 58 y.o. 05/15/2014   Principle Diagnosis:   Metastatic breast cancer-ER positive/HER-2 positive  Current Therapy:    Status post cycle 1 of Cytoxan/Taxotere/Herceptin/Perjeta  Zometa 4 mg IV every 3 weeks     Interim History:  Mary [name] is a diversely office visit. She has lost some weight. She's lost 17 pounds his last saw her. She said that the right breast mass is shrinking. It's not as tender. there is not as much discharge. She does not have as much abdominal pain. There is no rashes. She's had no joint or bone issues. She's really not complaining much in way of pain. She had no headache. She's had no fever sweats or chills. She's had no change in bowel or bladder habits there's been no diarrhea.  Of note, on her initial MUGA scan, she had an ejection fraction of 61%.  She's had no cough or shortness of breath. He's had a low nausea but no vomiting.    Medications: Current outpatient prescriptions:amLODipine (NORVASC) 5 MG tablet, Take 5 mg by mouth every morning., Disp: , Rfl: ;  clindamycin (CLEOCIN) 150 MG capsule, Take 600 mg by mouth 3 (three) times daily. She is taking for 7 days. She started on 04/11/14 and has not completed the regimen yet., Disp: , Rfl:  dexamethasone (DECADRON) 4 MG tablet, Take 2 tablets (8 mg total) by mouth 2 (two) times daily. Start the day before Taxotere. Then again the day after chemo for 3 days., Disp: 30 tablet, Rfl: 1;  Diphenhydramine-APAP, sleep, (HEADACHE RELIEF PM) 38-500 MG TABS, Take 2 tablets by mouth daily as needed (For headaches.)., Disp: , Rfl: ;  escitalopram (LEXAPRO) 20 MG tablet, Take 20 mg by mouth every morning., Disp: , Rfl:  levofloxacin (LEVAQUIN) 500 MG tablet, Take 500 mg by mouth 2 (two) times daily. , Disp: , Rfl: ;  lisinopril (PRINIVIL,ZESTRIL) 40 MG tablet, Take 40 mg by mouth every morning., Disp: , Rfl: ;  LORazepam (ATIVAN) 0.5 MG tablet,  Take 1 tablet (0.5 mg total) by mouth every 6 (six) hours as needed (Nausea or vomiting)., Disp: 30 tablet, Rfl: 0;  meloxicam (MOBIC) 7.5 MG tablet, Take 7.5 mg by mouth daily as needed for pain. , Disp: , Rfl:  ondansetron (ZOFRAN) 8 MG tablet, Take 1 tablet (8 mg total) by mouth 2 (two) times daily. Start the day after chemo for 3 days. Then take as needed for nausea or vomiting., Disp: 30 tablet, Rfl: 1;  prochlorperazine (COMPAZINE) 10 MG tablet, Take 1 tablet (10 mg total) by mouth every 6 (six) hours as needed (Nausea or vomiting)., Disp: 30 tablet, Rfl: 1 SUMAtriptan (IMITREX) 100 MG tablet, Take 1 tablet (100 mg total) by mouth every 2 (two) hours as needed for migraine., Disp: 10 tablet, Rfl: 3;  oxyCODONE (OXY IR/ROXICODONE) 5 MG immediate release tablet, Take 1 tablet (5 mg total) by mouth every 6 (six) hours as needed for severe pain., Disp: 30 tablet, Rfl: 0  Allergies:  Allergies  Allergen Reactions  . Hydrocodone     "makes me crawl on the floor"  . Aspirin Other (See Comments)    Due to ulcers  . Penicillins Swelling    Past Medical History, Surgical history, Social history, and Family History were reviewed and updated.  Review of Systems: As above  Physical Exam:  height is _0  (1.626 m) and weight is 153 lb (69.4 kg). Her oral temperature is 98.2  F (36.8 C). Her blood pressure is 145/90 and her pulse is 94. Her respiration is 18.   Somewhat thin African American female. Her head exam shows no ocular or oral lesions. There are no palpable cervical or supraclavicular lymph nodes. Lungs are clear. Cardiac exam regular rate and rhythm with no murmurs rubs or bruits. Abdomen is soft. Has good bowel sounds. There is no fluid wave. There is a probable abdominal mass. Her spleen tip is nonpalpable. Her liver edge is at the right costal margin. Back exam no tenderness over the spine ribs or hips. Right breast shows decrease in the mass in the right upper quadrant. There is a  dressing there. There is not much erythema or swelling. Leprous is unremarkable. I really cannot palpate much in way of right axillary lymphadenopathy. Extremities shows some mild lymphedema of the right arm. Skin exam no rashes.  Lab Results  Component Value Date   WBC 10.4* 05/14/2014   HGB 9.8* 05/14/2014   HCT 30.2* 05/14/2014   MCV 93 05/14/2014   PLT 631* 05/14/2014     Chemistry      Component Value Date/Time   NA 142 05/14/2014 0842   NA 137 04/09/2014 0450   K 3.7 05/14/2014 0842   K 3.5* 04/09/2014 0450   CL 103 05/14/2014 0842   CL 101 04/09/2014 0450   CO2 22 05/14/2014 0842   CO2 23 04/09/2014 0450   BUN 11 05/14/2014 0842   BUN 5* 04/09/2014 0450   CREATININE 0.8 05/14/2014 0842   CREATININE 0.59 04/09/2014 0450      Component Value Date/Time   CALCIUM 9.2 05/14/2014 0842   CALCIUM 9.3 04/09/2014 0450   ALKPHOS 208* 05/14/2014 0842   ALKPHOS 179* 04/07/2014 0630   AST 178* 05/14/2014 0842   AST 74* 04/07/2014 0630   ALT 25 05/14/2014 0842   ALT 34 04/07/2014 0630   BILITOT 0.90 05/14/2014 0842   BILITOT 0.7 04/07/2014 0630         Impression and Plan: Mary [name] is a 58 year old African American female. She has triple-positive breast cancer. She has a large right breast mass. She has multiple hepatic lesions. She does bony lesions.  I do think that she is responding. Her CA 27-29 is 3300. Hopefully, we will see this improved.  The weight loss is troublesome to me.  [place] does have to continue to continue with treatment. Hopefully, we will see an improvement with her weight, and tumor marker, and overall performance status.  We will go ahead and plan for treatment today.  Upon want to try another cycle of chemotherapy before we start to rescan her. Again, we will have to see how her CA 27.29 response.  I spent a  good half hour with her today.  Volanda Napoleon, MD 5/19/20156:32 AM

## 2014-05-28 ENCOUNTER — Other Ambulatory Visit: Payer: Self-pay | Admitting: *Deleted

## 2014-05-28 MED ORDER — OXYCODONE HCL 5 MG PO TABS: 5.0000 mg | ORAL_TABLET | Freq: Four times a day (QID) | ORAL | Status: DC | PRN

## 2014-05-28 MED ORDER — LIDOCAINE-PRILOCAINE 2.5-2.5 % EX CREA: 1.0000 "application " | TOPICAL_CREAM | CUTANEOUS | Status: DC | PRN

## 2014-06-04 ENCOUNTER — Ambulatory Visit: Payer: Self-pay | Admitting: Hematology & Oncology

## 2014-06-04 ENCOUNTER — Other Ambulatory Visit (HOSPITAL_BASED_OUTPATIENT_CLINIC_OR_DEPARTMENT_OTHER): Payer: BC Managed Care – PPO | Admitting: Lab

## 2014-06-04 ENCOUNTER — Encounter: Payer: Self-pay | Admitting: Hematology & Oncology

## 2014-06-04 ENCOUNTER — Telehealth: Payer: Self-pay | Admitting: Hematology & Oncology

## 2014-06-04 ENCOUNTER — Other Ambulatory Visit: Payer: Self-pay | Admitting: Lab

## 2014-06-04 ENCOUNTER — Ambulatory Visit: Payer: Self-pay

## 2014-06-04 ENCOUNTER — Ambulatory Visit (HOSPITAL_BASED_OUTPATIENT_CLINIC_OR_DEPARTMENT_OTHER): Payer: BC Managed Care – PPO | Admitting: Hematology & Oncology

## 2014-06-04 ENCOUNTER — Ambulatory Visit (HOSPITAL_BASED_OUTPATIENT_CLINIC_OR_DEPARTMENT_OTHER): Payer: BC Managed Care – PPO

## 2014-06-04 ENCOUNTER — Other Ambulatory Visit: Payer: Self-pay | Admitting: *Deleted

## 2014-06-04 VITALS — BP 169/97 | HR 120 | Temp 98.1°F | Resp 16 | Ht 64.0 in | Wt 148.0 lb

## 2014-06-04 DIAGNOSIS — C7952 Secondary malignant neoplasm of bone marrow: Secondary | ICD-10-CM

## 2014-06-04 DIAGNOSIS — D509 Iron deficiency anemia, unspecified: Secondary | ICD-10-CM

## 2014-06-04 DIAGNOSIS — C787 Secondary malignant neoplasm of liver and intrahepatic bile duct: Secondary | ICD-10-CM

## 2014-06-04 DIAGNOSIS — C7800 Secondary malignant neoplasm of unspecified lung: Secondary | ICD-10-CM | POA: Diagnosis not present

## 2014-06-04 DIAGNOSIS — Z5112 Encounter for antineoplastic immunotherapy: Secondary | ICD-10-CM

## 2014-06-04 DIAGNOSIS — C7951 Secondary malignant neoplasm of bone: Secondary | ICD-10-CM | POA: Diagnosis not present

## 2014-06-04 DIAGNOSIS — Z5111 Encounter for antineoplastic chemotherapy: Secondary | ICD-10-CM

## 2014-06-04 DIAGNOSIS — C50919 Malignant neoplasm of unspecified site of unspecified female breast: Secondary | ICD-10-CM

## 2014-06-04 DIAGNOSIS — Z170 Estrogen receptor positive status [ER+]: Secondary | ICD-10-CM

## 2014-06-04 DIAGNOSIS — R634 Abnormal weight loss: Secondary | ICD-10-CM

## 2014-06-04 LAB — CBC WITH DIFFERENTIAL (CANCER CENTER ONLY)
BASO#: 0 10*3/uL (ref 0.0–0.2)
BASO%: 0.3 % (ref 0.0–2.0)
EOS ABS: 0 10*3/uL (ref 0.0–0.5)
EOS%: 0 % (ref 0.0–7.0)
HCT: 32 % — ABNORMAL LOW (ref 34.8–46.6)
HEMOGLOBIN: 10.4 g/dL — AB (ref 11.6–15.9)
LYMPH#: 1 10*3/uL (ref 0.9–3.3)
LYMPH%: 8.9 % — ABNORMAL LOW (ref 14.0–48.0)
MCH: 30.2 pg (ref 26.0–34.0)
MCHC: 32.5 g/dL (ref 32.0–36.0)
MCV: 93 fL (ref 81–101)
MONO#: 0.1 10*3/uL (ref 0.1–0.9)
MONO%: 1.2 % (ref 0.0–13.0)
NEUT#: 10.2 10*3/uL — ABNORMAL HIGH (ref 1.5–6.5)
NEUT%: 89.6 % — AB (ref 39.6–80.0)
Platelets: 536 10*3/uL — ABNORMAL HIGH (ref 145–400)
RBC: 3.44 10*6/uL — AB (ref 3.70–5.32)
RDW: 20.7 % — AB (ref 11.1–15.7)
WBC: 11.4 10*3/uL — AB (ref 3.9–10.0)

## 2014-06-04 LAB — CANCER ANTIGEN 27.29: CA 27.29: 2322 U/mL — AB (ref 0–39)

## 2014-06-04 LAB — LACTATE DEHYDROGENASE: LDH: 1551 U/L — AB (ref 94–250)

## 2014-06-04 LAB — CMP (CANCER CENTER ONLY)
ALK PHOS: 224 U/L — AB (ref 26–84)
ALT(SGPT): 9 U/L — ABNORMAL LOW (ref 10–47)
AST: 114 U/L — AB (ref 11–38)
Albumin: 3.5 g/dL (ref 3.3–5.5)
BILIRUBIN TOTAL: 0.9 mg/dL (ref 0.20–1.60)
BUN: 8 mg/dL (ref 7–22)
CO2: 22 mEq/L (ref 18–33)
Calcium: 9.2 mg/dL (ref 8.0–10.3)
Chloride: 103 mEq/L (ref 98–108)
Creat: 0.7 mg/dl (ref 0.6–1.2)
GLUCOSE: 168 mg/dL — AB (ref 73–118)
Potassium: 3.3 mEq/L (ref 3.3–4.7)
Sodium: 139 mEq/L (ref 128–145)
Total Protein: 7.9 g/dL (ref 6.4–8.1)

## 2014-06-04 LAB — PREALBUMIN: Prealbumin: 10.4 mg/dL — ABNORMAL LOW (ref 17.0–34.0)

## 2014-06-04 MED ORDER — ONDANSETRON 16 MG/50ML IVPB (CHCC)
16.0000 mg | Freq: Once | INTRAVENOUS | Status: AC
  Administered 2014-06-04: 16 mg via INTRAVENOUS

## 2014-06-04 MED ORDER — DEXAMETHASONE SODIUM PHOSPHATE 20 MG/5ML IJ SOLN
20.0000 mg | Freq: Once | INTRAMUSCULAR | Status: AC
  Administered 2014-06-04: 20 mg via INTRAVENOUS

## 2014-06-04 MED ORDER — DOCETAXEL CHEMO INJECTION 160 MG/16ML
75.0000 mg/m2 | Freq: Once | INTRAVENOUS | Status: AC
  Administered 2014-06-04: 140 mg via INTRAVENOUS
  Filled 2014-06-04: qty 14

## 2014-06-04 MED ORDER — ACETAMINOPHEN 325 MG PO TABS
ORAL_TABLET | ORAL | Status: AC
  Filled 2014-06-04: qty 2

## 2014-06-04 MED ORDER — TRASTUZUMAB CHEMO INJECTION 440 MG
420.0000 mg | Freq: Once | INTRAVENOUS | Status: AC
  Administered 2014-06-04: 420 mg via INTRAVENOUS
  Filled 2014-06-04: qty 20

## 2014-06-04 MED ORDER — DIPHENHYDRAMINE HCL 25 MG PO CAPS
ORAL_CAPSULE | ORAL | Status: AC
  Filled 2014-06-04: qty 2

## 2014-06-04 MED ORDER — OXYCODONE HCL 5 MG PO TABS: 5.0000 mg | ORAL_TABLET | Freq: Four times a day (QID) | ORAL | Status: DC | PRN

## 2014-06-04 MED ORDER — HEPARIN SOD (PORK) LOCK FLUSH 100 UNIT/ML IV SOLN
500.0000 [IU] | Freq: Once | INTRAVENOUS | Status: DC | PRN
  Filled 2014-06-04: qty 5

## 2014-06-04 MED ORDER — DEXAMETHASONE SODIUM PHOSPHATE 20 MG/5ML IJ SOLN
INTRAMUSCULAR | Status: AC
  Filled 2014-06-04: qty 5

## 2014-06-04 MED ORDER — ACETAMINOPHEN 325 MG PO TABS
650.0000 mg | ORAL_TABLET | Freq: Once | ORAL | Status: AC
  Administered 2014-06-04: 650 mg via ORAL

## 2014-06-04 MED ORDER — SODIUM CHLORIDE 0.9 % IV SOLN
600.0000 mg/m2 | Freq: Once | INTRAVENOUS | Status: AC
  Administered 2014-06-04: 1120 mg via INTRAVENOUS
  Filled 2014-06-04: qty 56

## 2014-06-04 MED ORDER — PANTOPRAZOLE SODIUM 40 MG PO TBEC: 40.0000 mg | DELAYED_RELEASE_TABLET | Freq: Every day | ORAL | Status: DC

## 2014-06-04 MED ORDER — DIPHENHYDRAMINE HCL 25 MG PO CAPS
50.0000 mg | ORAL_CAPSULE | Freq: Once | ORAL | Status: AC
  Administered 2014-06-04: 50 mg via ORAL

## 2014-06-04 MED ORDER — SODIUM CHLORIDE 0.9 % IJ SOLN
10.0000 mL | INTRAMUSCULAR | Status: DC | PRN
  Filled 2014-06-04: qty 10

## 2014-06-04 MED ORDER — ONDANSETRON 16 MG/50ML IVPB (CHCC)
INTRAVENOUS | Status: AC
  Filled 2014-06-04: qty 16

## 2014-06-04 MED ORDER — SODIUM CHLORIDE 0.9 % IV SOLN
Freq: Once | INTRAVENOUS | Status: AC
  Administered 2014-06-04: 10:00:00 via INTRAVENOUS

## 2014-06-04 MED ORDER — SODIUM CHLORIDE 0.9 % IV SOLN
420.0000 mg | Freq: Once | INTRAVENOUS | Status: AC
  Administered 2014-06-04: 420 mg via INTRAVENOUS
  Filled 2014-06-04: qty 14

## 2014-06-04 MED ORDER — ZOLEDRONIC ACID 4 MG/100ML IV SOLN
4.0000 mg | Freq: Once | INTRAVENOUS | Status: AC
  Administered 2014-06-04: 4 mg via INTRAVENOUS
  Filled 2014-06-04: qty 100

## 2014-06-04 NOTE — Patient Instructions (Signed)
[facility] Discharge Instructions for Patients Receiving Chemotherapy  Today you received the following chemotherapy agents Taxotere, Cytoxan, Herceptin, and Purjeta.  To help prevent nausea and vomiting after your treatment, we encourage you to take your nausea medication as prescribed.    If you develop nausea and vomiting that is not controlled by your nausea medication, call the clinic.   BELOW ARE SYMPTOMS THAT SHOULD BE REPORTED IMMEDIATELY:  *FEVER GREATER THAN 100.5 F  *CHILLS WITH OR WITHOUT FEVER  NAUSEA AND VOMITING THAT IS NOT CONTROLLED WITH YOUR NAUSEA MEDICATION  *UNUSUAL SHORTNESS OF BREATH  *UNUSUAL BRUISING OR BLEEDING  TENDERNESS IN MOUTH AND THROAT WITH OR WITHOUT PRESENCE OF ULCERS  *URINARY PROBLEMS  *BOWEL PROBLEMS  UNUSUAL RASH Items with * indicate a potential emergency and should be followed up as soon as possible.  Feel free to call the clinic you have any questions or concerns. The clinic phone number is [phone].

## 2014-06-04 NOTE — Telephone Encounter (Signed)
Thank you for completing your registration online.   Your card is now ready to use. Your confirmation number is [id number].   Simply hand your Neulasta FIRST STEP Program card over to the office staff when you arrive for your Neulasta(pegfilgrastim) treatment. The card can then be used to help cover the cost of your deductible, co-insurance, and/or co-payment for your Neulasta.

## 2014-06-04 NOTE — Progress Notes (Signed)
Hematology and Oncology Follow Up Visit  Mary [name] [id number] April 01, 1956 58 y.o. 06/04/2014   Principle Diagnosis:  Metastatic breast cancer-ER positive/HER-2 positive  Current Therapy:    Status post cycle 2 of Cytoxan/Taxotere/Herceptin/Perjeta  Zometa 4 mg IV every 3 weeks     Interim History:  [name] is back for followup. She really looks good. She's still losing some weight.  Her right breast is improving nicely. She still has some drainage but the fullness in the region and the firmness is improving nicely.  She's had no problems with increased pain. She does take oxycodone for pain.  She says her appetite is doing better. She's had no diarrhea. She's had no fever. His been no cough. She's had no headache.  Her last CA 27.29 was 3300.  Overall, her performance status is ECOG 1  Medications: Current outpatient prescriptions:amLODipine (NORVASC) 5 MG tablet, Take 5 mg by mouth every morning., Disp: , Rfl: ;  dexamethasone (DECADRON) 4 MG tablet, Take 2 tablets (8 mg total) by mouth 2 (two) times daily. Start the day before Taxotere. Then again the day after chemo for 3 days., Disp: 30 tablet, Rfl: 1;  Diphenhydramine-APAP, sleep, (HEADACHE RELIEF PM) 38-500 MG TABS, Take 2 tablets by mouth daily as needed (For headaches.)., Disp: , Rfl:  escitalopram (LEXAPRO) 20 MG tablet, Take 20 mg by mouth every morning., Disp: , Rfl: ;  lisinopril (PRINIVIL,ZESTRIL) 40 MG tablet, Take 40 mg by mouth every morning., Disp: , Rfl: ;  LORazepam (ATIVAN) 0.5 MG tablet, Take 1 tablet (0.5 mg total) by mouth every 6 (six) hours as needed (Nausea or vomiting)., Disp: 30 tablet, Rfl: 0;  meloxicam (MOBIC) 7.5 MG tablet, Take 7.5 mg by mouth daily as needed for pain. , Disp: , Rfl:  NON FORMULARY, Take by mouth every morning. OTC  Iron tab, Disp: , Rfl: ;  ondansetron (ZOFRAN) 8 MG tablet, Take 1 tablet (8 mg total) by mouth 2 (two) times daily. Start the day after chemo for 3 days. Then take as  needed for nausea or vomiting., Disp: 30 tablet, Rfl: 1;  prochlorperazine (COMPAZINE) 10 MG tablet, Take 1 tablet (10 mg total) by mouth every 6 (six) hours as needed (Nausea or vomiting)., Disp: 30 tablet, Rfl: 1 SUMAtriptan (IMITREX) 100 MG tablet, Take 1 tablet (100 mg total) by mouth every 2 (two) hours as needed for migraine., Disp: 10 tablet, Rfl: 3;  lidocaine-prilocaine (EMLA) cream, Apply 1 application topically as needed. Apply quarter sized amount to portacath site at least one hour prior to treatment .  Cover with saran wrap, Disp: 30 g, Rfl: 0 oxyCODONE (OXY IR/ROXICODONE) 5 MG immediate release tablet, Take 1 tablet (5 mg total) by mouth every 6 (six) hours as needed for severe pain., Disp: 30 tablet, Rfl: 0;  pantoprazole (PROTONIX) 40 MG tablet, Take 1 tablet (40 mg total) by mouth daily., Disp: 30 tablet, Rfl: 6 No current facility-administered medications for this visit. Facility-Administered Medications Ordered in Other Visits: heparin lock flush 100 unit/mL, 500 Units, Intracatheter, Once PRN, Volanda Napoleon, MD;  sodium chloride 0.9 % injection 10 mL, 10 mL, Intracatheter, PRN, Volanda Napoleon, MD  Allergies:  Allergies  Allergen Reactions  . Hydrocodone     "makes me crawl on the floor"  . Aspirin Other (See Comments)    Due to ulcers  . Penicillins Swelling    Past Medical History, Surgical history, Social history, and Family History were reviewed and updated.  Review of Systems:  As above  Physical Exam:  height is _0  (1.626 m) and weight is 148 lb (67.132 kg). Her oral temperature is 98.1 F (36.7 C). Her blood pressure is 169/97 and her pulse is 120. Her respiration is 16.   Thin but fairly well-nourished African American female. Her head and neck exam shows no ocular or oral lesions. She has no palpable cervical or supraclavicular lymph nodes. Lungs are clear. Cardiac exam regular in rhythm with no murmurs rubs or bruits. Breast exam shows left breast with no  masses edema or erythema. There is no left axillary adenopathy. Right breast shows a marked decrease in right breast mass. She has some firmness in the right axilla but this is also improved. There is decreased erythema in the right axillary area. There is decreased lymphadenopathy in the right axilla. Abdomen is soft. Has good bowel sounds. Liver edge might be palpable at the right costal margin. There is no spleen tip. Extremities shows no clubbing cyanosis or edema. She has no lymphedema of the right are. Shows good range of motion strength of her extremities. Skin exam no rashes. Neurological exam is nonfocal.  Lab Results  Component Value Date   WBC 11.4* 06/04/2014   HGB 10.4* 06/04/2014   HCT 32.0* 06/04/2014   MCV 93 06/04/2014   PLT 536* 06/04/2014     Chemistry      Component Value Date/Time   NA 139 06/04/2014 0837   NA 137 04/09/2014 0450   K 3.3 06/04/2014 0837   K 3.5* 04/09/2014 0450   CL 103 06/04/2014 0837   CL 101 04/09/2014 0450   CO2 22 06/04/2014 0837   CO2 23 04/09/2014 0450   BUN 8 06/04/2014 0837   BUN 5* 04/09/2014 0450   CREATININE 0.7 06/04/2014 0837   CREATININE 0.59 04/09/2014 0450      Component Value Date/Time   CALCIUM 9.2 06/04/2014 0837   CALCIUM 9.3 04/09/2014 0450   ALKPHOS 224* 06/04/2014 0837   ALKPHOS 179* 04/07/2014 0630   AST 114* 06/04/2014 0837   AST 74* 04/07/2014 0630   ALT 9* 06/04/2014 0837   ALT 34 04/07/2014 0630   BILITOT 0.90 06/04/2014 0837   BILITOT 0.7 04/07/2014 0630         Impression and Plan: Mary [name] is 58 year old Afro-American female. She has metastatic breast cancer. She has had 2 cycles of chemotherapy. Clinically, she is responding.  We will see how she is doing radiographically now. We will get CT scans to see how things are looking. I have to believe that her cancer is responding.  We did do a CA 27.29. it Is now down to 2300.  I still am not happy with the weight loss. I [name] can do to try to get her to gain some weight. She  does say that she is eating okay.  We will get her back in another 3 weeks.   Volanda Napoleon, MD 6/8/20156:26 PM

## 2014-06-05 ENCOUNTER — Ambulatory Visit (HOSPITAL_BASED_OUTPATIENT_CLINIC_OR_DEPARTMENT_OTHER): Payer: BC Managed Care – PPO

## 2014-06-05 VITALS — BP 118/72 | HR 77 | Temp 96.9°F | Resp 20

## 2014-06-05 DIAGNOSIS — C7952 Secondary malignant neoplasm of bone marrow: Secondary | ICD-10-CM

## 2014-06-05 DIAGNOSIS — C7800 Secondary malignant neoplasm of unspecified lung: Secondary | ICD-10-CM

## 2014-06-05 DIAGNOSIS — C7951 Secondary malignant neoplasm of bone: Secondary | ICD-10-CM

## 2014-06-05 DIAGNOSIS — C787 Secondary malignant neoplasm of liver and intrahepatic bile duct: Secondary | ICD-10-CM

## 2014-06-05 DIAGNOSIS — Z5189 Encounter for other specified aftercare: Secondary | ICD-10-CM

## 2014-06-05 DIAGNOSIS — C50919 Malignant neoplasm of unspecified site of unspecified female breast: Secondary | ICD-10-CM

## 2014-06-05 MED ORDER — PEGFILGRASTIM INJECTION 6 MG/0.6ML
6.0000 mg | Freq: Once | SUBCUTANEOUS | Status: AC
  Administered 2014-06-05: 6 mg via SUBCUTANEOUS

## 2014-06-05 MED ORDER — PEGFILGRASTIM INJECTION 6 MG/0.6ML
SUBCUTANEOUS | Status: AC
  Filled 2014-06-05: qty 0.6

## 2014-06-05 NOTE — Patient Instructions (Signed)

## 2014-06-06 ENCOUNTER — Telehealth: Payer: Self-pay | Admitting: Hematology & Oncology

## 2014-06-06 NOTE — Telephone Encounter (Signed)
Left pt message to call about 6-25 CT instructions

## 2014-06-21 ENCOUNTER — Ambulatory Visit (HOSPITAL_COMMUNITY)
Admission: RE | Admit: 2014-06-21 | Discharge: 2014-06-21 | Disposition: A | Discharge status: Home / Self Care | Payer: BC Managed Care – PPO | Source: Ambulatory Visit | Attending: Hematology & Oncology | Admitting: Hematology & Oncology

## 2014-06-21 ENCOUNTER — Encounter (HOSPITAL_COMMUNITY): Payer: Self-pay

## 2014-06-21 DIAGNOSIS — C7951 Secondary malignant neoplasm of bone: Secondary | ICD-10-CM | POA: Insufficient documentation

## 2014-06-21 DIAGNOSIS — J90 Pleural effusion, not elsewhere classified: Secondary | ICD-10-CM | POA: Diagnosis not present

## 2014-06-21 DIAGNOSIS — M8448XA Pathological fracture, other site, initial encounter for fracture: Secondary | ICD-10-CM | POA: Insufficient documentation

## 2014-06-21 DIAGNOSIS — R599 Enlarged lymph nodes, unspecified: Secondary | ICD-10-CM | POA: Diagnosis not present

## 2014-06-21 DIAGNOSIS — C7952 Secondary malignant neoplasm of bone marrow: Secondary | ICD-10-CM

## 2014-06-21 DIAGNOSIS — D3000 Benign neoplasm of unspecified kidney: Secondary | ICD-10-CM | POA: Diagnosis not present

## 2014-06-21 DIAGNOSIS — C787 Secondary malignant neoplasm of liver and intrahepatic bile duct: Secondary | ICD-10-CM | POA: Insufficient documentation

## 2014-06-21 DIAGNOSIS — D509 Iron deficiency anemia, unspecified: Secondary | ICD-10-CM

## 2014-06-21 DIAGNOSIS — C50919 Malignant neoplasm of unspecified site of unspecified female breast: Secondary | ICD-10-CM

## 2014-06-21 DIAGNOSIS — J9819 Other pulmonary collapse: Secondary | ICD-10-CM | POA: Insufficient documentation

## 2014-06-21 MED ORDER — IOHEXOL 300 MG/ML  SOLN
100.0000 mL | Freq: Once | INTRAMUSCULAR | Status: AC | PRN
  Administered 2014-06-21: 100 mL via INTRAVENOUS

## 2014-06-25 ENCOUNTER — Other Ambulatory Visit: Payer: Self-pay | Admitting: Lab

## 2014-06-25 ENCOUNTER — Ambulatory Visit: Payer: Self-pay | Admitting: Hematology & Oncology

## 2014-06-25 ENCOUNTER — Encounter: Payer: Self-pay | Admitting: Hematology & Oncology

## 2014-06-25 ENCOUNTER — Other Ambulatory Visit (HOSPITAL_BASED_OUTPATIENT_CLINIC_OR_DEPARTMENT_OTHER): Payer: BC Managed Care – PPO | Admitting: Lab

## 2014-06-25 ENCOUNTER — Ambulatory Visit (HOSPITAL_BASED_OUTPATIENT_CLINIC_OR_DEPARTMENT_OTHER): Payer: BC Managed Care – PPO | Admitting: Hematology & Oncology

## 2014-06-25 ENCOUNTER — Ambulatory Visit (HOSPITAL_BASED_OUTPATIENT_CLINIC_OR_DEPARTMENT_OTHER): Payer: BC Managed Care – PPO

## 2014-06-25 ENCOUNTER — Ambulatory Visit: Payer: Self-pay

## 2014-06-25 VITALS — BP 159/94 | HR 102 | Temp 98.2°F | Resp 14 | Ht 64.0 in | Wt 145.0 lb

## 2014-06-25 DIAGNOSIS — C50919 Malignant neoplasm of unspecified site of unspecified female breast: Secondary | ICD-10-CM

## 2014-06-25 DIAGNOSIS — C787 Secondary malignant neoplasm of liver and intrahepatic bile duct: Secondary | ICD-10-CM

## 2014-06-25 DIAGNOSIS — C7952 Secondary malignant neoplasm of bone marrow: Secondary | ICD-10-CM

## 2014-06-25 DIAGNOSIS — D509 Iron deficiency anemia, unspecified: Secondary | ICD-10-CM

## 2014-06-25 DIAGNOSIS — C7951 Secondary malignant neoplasm of bone: Secondary | ICD-10-CM

## 2014-06-25 DIAGNOSIS — C7800 Secondary malignant neoplasm of unspecified lung: Secondary | ICD-10-CM

## 2014-06-25 DIAGNOSIS — C50911 Malignant neoplasm of unspecified site of right female breast: Secondary | ICD-10-CM

## 2014-06-25 DIAGNOSIS — Z5112 Encounter for antineoplastic immunotherapy: Secondary | ICD-10-CM

## 2014-06-25 LAB — CBC WITH DIFFERENTIAL (CANCER CENTER ONLY)
BASO#: 0 10*3/uL (ref 0.0–0.2)
BASO%: 0.3 % (ref 0.0–2.0)
EOS%: 0 % (ref 0.0–7.0)
Eosinophils Absolute: 0 10*3/uL (ref 0.0–0.5)
HCT: 34.6 % — ABNORMAL LOW (ref 34.8–46.6)
HGB: 11.4 g/dL — ABNORMAL LOW (ref 11.6–15.9)
LYMPH#: 0.6 10*3/uL — ABNORMAL LOW (ref 0.9–3.3)
LYMPH%: 8.1 % — ABNORMAL LOW (ref 14.0–48.0)
MCH: 30.6 pg (ref 26.0–34.0)
MCHC: 32.9 g/dL (ref 32.0–36.0)
MCV: 93 fL (ref 81–101)
MONO#: 0 10*3/uL — ABNORMAL LOW (ref 0.1–0.9)
MONO%: 0.5 % (ref 0.0–13.0)
NEUT%: 91.1 % — ABNORMAL HIGH (ref 39.6–80.0)
NEUTROS ABS: 7.1 10*3/uL — AB (ref 1.5–6.5)
Platelets: 462 10*3/uL — ABNORMAL HIGH (ref 145–400)
RBC: 3.73 10*6/uL (ref 3.70–5.32)
RDW: 20.7 % — AB (ref 11.1–15.7)
WBC: 7.8 10*3/uL (ref 3.9–10.0)

## 2014-06-25 LAB — IRON AND TIBC CHCC
%SAT: 15 % — AB (ref 21–57)
Iron: 43 ug/dL (ref 41–142)
TIBC: 284 ug/dL (ref 236–444)
UIBC: 241 ug/dL (ref 120–384)

## 2014-06-25 LAB — CMP (CANCER CENTER ONLY)
ALK PHOS: 221 U/L — AB (ref 26–84)
ALT(SGPT): 19 U/L (ref 10–47)
AST: 83 U/L — ABNORMAL HIGH (ref 11–38)
Albumin: 3.8 g/dL (ref 3.3–5.5)
BUN: 9 mg/dL (ref 7–22)
CALCIUM: 9.5 mg/dL (ref 8.0–10.3)
CHLORIDE: 104 meq/L (ref 98–108)
CO2: 25 mEq/L (ref 18–33)
Creat: 0.6 mg/dl (ref 0.6–1.2)
Glucose, Bld: 163 mg/dL — ABNORMAL HIGH (ref 73–118)
POTASSIUM: 3.9 meq/L (ref 3.3–4.7)
SODIUM: 142 meq/L (ref 128–145)
Total Bilirubin: 0.9 mg/dl (ref 0.20–1.60)
Total Protein: 8.1 g/dL (ref 6.4–8.1)

## 2014-06-25 LAB — FERRITIN CHCC: Ferritin: 3172 ng/ml — ABNORMAL HIGH (ref 9–269)

## 2014-06-25 LAB — LACTATE DEHYDROGENASE: LDH: 964 U/L — ABNORMAL HIGH (ref 94–250)

## 2014-06-25 LAB — PREALBUMIN: PREALBUMIN: 12.7 mg/dL — AB (ref 17.0–34.0)

## 2014-06-25 LAB — CANCER ANTIGEN 27.29: CA 27.29: 1419 U/mL — ABNORMAL HIGH (ref 0–39)

## 2014-06-25 MED ORDER — SODIUM CHLORIDE 0.9 % IV SOLN
420.0000 mg | Freq: Once | INTRAVENOUS | Status: AC
  Administered 2014-06-25: 420 mg via INTRAVENOUS
  Filled 2014-06-25: qty 14

## 2014-06-25 MED ORDER — DOCETAXEL CHEMO INJECTION 160 MG/16ML
75.0000 mg/m2 | Freq: Once | INTRAVENOUS | Status: AC
  Administered 2014-06-25: 140 mg via INTRAVENOUS
  Filled 2014-06-25: qty 14

## 2014-06-25 MED ORDER — ONDANSETRON 16 MG/50ML IVPB (CHCC)
INTRAVENOUS | Status: AC
  Filled 2014-06-25: qty 16

## 2014-06-25 MED ORDER — DEXAMETHASONE SODIUM PHOSPHATE 20 MG/5ML IJ SOLN
INTRAMUSCULAR | Status: AC
  Filled 2014-06-25: qty 5

## 2014-06-25 MED ORDER — DEXAMETHASONE SODIUM PHOSPHATE 20 MG/5ML IJ SOLN
20.0000 mg | Freq: Once | INTRAMUSCULAR | Status: DC
Start: 2014-06-25 — End: 2014-06-25

## 2014-06-25 MED ORDER — HEPARIN SOD (PORK) LOCK FLUSH 100 UNIT/ML IV SOLN
500.0000 [IU] | Freq: Once | INTRAVENOUS | Status: AC | PRN
  Administered 2014-06-25: 500 [IU]
  Filled 2014-06-25: qty 5

## 2014-06-25 MED ORDER — SODIUM CHLORIDE 0.9 % IJ SOLN
10.0000 mL | INTRAMUSCULAR | Status: DC | PRN
  Administered 2014-06-25: 10 mL
  Filled 2014-06-25: qty 10

## 2014-06-25 MED ORDER — TRASTUZUMAB CHEMO INJECTION 440 MG
6.0000 mg/kg | Freq: Once | INTRAVENOUS | Status: AC
  Administered 2014-06-25: 399 mg via INTRAVENOUS
  Filled 2014-06-25: qty 19

## 2014-06-25 MED ORDER — SODIUM CHLORIDE 0.9 % IV SOLN
Freq: Once | INTRAVENOUS | Status: AC
  Administered 2014-06-25: 09:00:00 via INTRAVENOUS

## 2014-06-25 MED ORDER — ACETAMINOPHEN 325 MG PO TABS
ORAL_TABLET | ORAL | Status: AC
  Filled 2014-06-25: qty 2

## 2014-06-25 MED ORDER — ACETAMINOPHEN 325 MG PO TABS
650.0000 mg | ORAL_TABLET | Freq: Once | ORAL | Status: AC
  Administered 2014-06-25: 650 mg via ORAL

## 2014-06-25 MED ORDER — DIPHENHYDRAMINE HCL 25 MG PO CAPS
ORAL_CAPSULE | ORAL | Status: AC
  Filled 2014-06-25: qty 2

## 2014-06-25 MED ORDER — DIPHENHYDRAMINE HCL 25 MG PO CAPS
50.0000 mg | ORAL_CAPSULE | Freq: Once | ORAL | Status: AC
  Administered 2014-06-25: 50 mg via ORAL

## 2014-06-25 MED ORDER — SODIUM CHLORIDE 0.9 % IV SOLN
600.0000 mg/m2 | Freq: Once | INTRAVENOUS | Status: AC
  Administered 2014-06-25: 1120 mg via INTRAVENOUS
  Filled 2014-06-25: qty 56

## 2014-06-25 MED ORDER — ONDANSETRON 16 MG/50ML IVPB (CHCC)
16.0000 mg | Freq: Once | INTRAVENOUS | Status: AC
  Administered 2014-06-25: 16 mg via INTRAVENOUS

## 2014-06-25 NOTE — Patient Instructions (Signed)
[facility] Discharge Instructions for Patients Receiving Chemotherapy  Today you received the following chemotherapy agents Taxotere, Cytoxan, Herceptin, and Perjeta.  To help prevent nausea and vomiting after your treatment, we encourage you to take your nausea medication as prescribed.    If you develop nausea and vomiting that is not controlled by your nausea medication, call the clinic.   BELOW ARE SYMPTOMS THAT SHOULD BE REPORTED IMMEDIATELY:  *FEVER GREATER THAN 100.5 F  *CHILLS WITH OR WITHOUT FEVER  NAUSEA AND VOMITING THAT IS NOT CONTROLLED WITH YOUR NAUSEA MEDICATION  *UNUSUAL SHORTNESS OF BREATH  *UNUSUAL BRUISING OR BLEEDING  TENDERNESS IN MOUTH AND THROAT WITH OR WITHOUT PRESENCE OF ULCERS  *URINARY PROBLEMS  *BOWEL PROBLEMS  UNUSUAL RASH Items with * indicate a potential emergency and should be followed up as soon as possible.  Feel free to call the clinic you have any questions or concerns. The clinic phone number is [phone].

## 2014-06-25 NOTE — Progress Notes (Signed)
Hematology and Oncology Follow Up Visit  Aniya Jolicoeur [id number] 05/14/1956 58 y.o. 06/25/2014   Principle Diagnosis:   Metastatic breast cancer-ER positive/HER-2 positive  Current Therapy:    Status post 3 cycles of Cytoxan/Taxotere/Herceptin/Perjeta  Zometa 4 mg IV Q3 weeks     Interim History:  Ms.  Thorley is back for followup. She did look better. She's losing more weight. However, she says that she is eating well.  We did repeat a CT scan on her. This shows improvement in all of her disease areas. She's had nice improvement in the right breast mass in the right axillary lymph nodes. She's had improvement in her liver metastases.  Her CA 27.29 keeps coming down. When we checked it today, and that she was down to 1419.  Her LDH is also starting to come down.  She's had no bleeding. She has had much less pain. She's had no problems bowels or bladder.  Overall, her performance status is ECOG 1.  Medications: Current outpatient prescriptions:amLODipine (NORVASC) 5 MG tablet, Take 5 mg by mouth every morning., Disp: , Rfl: ;  dexamethasone (DECADRON) 4 MG tablet, Take 2 tablets (8 mg total) by mouth 2 (two) times daily. Start the day before Taxotere. Then again the day after chemo for 3 days., Disp: 30 tablet, Rfl: 1;  Diphenhydramine-APAP, sleep, (HEADACHE RELIEF PM) 38-500 MG TABS, Take 2 tablets by mouth daily as needed (For headaches.)., Disp: , Rfl:  escitalopram (LEXAPRO) 20 MG tablet, Take 20 mg by mouth every morning., Disp: , Rfl: ;  lidocaine-prilocaine (EMLA) cream, Apply 1 application topically as needed. Apply quarter sized amount to portacath site at least one hour prior to treatment .  Cover with saran wrap, Disp: 30 g, Rfl: 0;  lisinopril (PRINIVIL,ZESTRIL) 40 MG tablet, Take 40 mg by mouth every morning., Disp: , Rfl:  LORazepam (ATIVAN) 0.5 MG tablet, Take 1 tablet (0.5 mg total) by mouth every 6 (six) hours as needed (Nausea or vomiting)., Disp: 30 tablet, Rfl: 0;   meloxicam (MOBIC) 7.5 MG tablet, Take 7.5 mg by mouth daily as needed for pain. , Disp: , Rfl: ;  NON FORMULARY, Take by mouth every morning. OTC  Iron tab, Disp: , Rfl:  ondansetron (ZOFRAN) 8 MG tablet, Take 1 tablet (8 mg total) by mouth 2 (two) times daily. Start the day after chemo for 3 days. Then take as needed for nausea or vomiting., Disp: 30 tablet, Rfl: 1;  oxyCODONE (OXY IR/ROXICODONE) 5 MG immediate release tablet, Take 1 tablet (5 mg total) by mouth every 6 (six) hours as needed for severe pain., Disp: 30 tablet, Rfl: 0 pantoprazole (PROTONIX) 40 MG tablet, Take 1 tablet (40 mg total) by mouth daily., Disp: 30 tablet, Rfl: 6;  prochlorperazine (COMPAZINE) 10 MG tablet, Take 1 tablet (10 mg total) by mouth every 6 (six) hours as needed (Nausea or vomiting)., Disp: 30 tablet, Rfl: 1;  SUMAtriptan (IMITREX) 100 MG tablet, Take 1 tablet (100 mg total) by mouth every 2 (two) hours as needed for migraine., Disp: 10 tablet, Rfl: 3  Allergies:  Allergies  Allergen Reactions  . Hydrocodone     "makes me crawl on the floor"  . Aspirin Other (See Comments)    Due to ulcers  . Penicillins Swelling    Past Medical History, Surgical history, Social history, and Family History were reviewed and updated.  Review of Systems: As above  Physical Exam:  height is 5' 4"  (1.626 m) and weight is 145 lb (65.772 kg).  Her oral temperature is 98.2 F (36.8 C). Her blood pressure is 159/94 and her pulse is 102. Her respiration is 14.   Fairly thin African American female. Head and neck exam shows no ocular or oral lesions. She has no palpable cervical or supraclavicular lymph nodes. Lungs are clear bilaterally. Cardiac exam regular rate and rhythm with no murmurs was or bruits. Breast exam shows but present no masses. Right breast shows no obvious mass in the right breast. The right axillary lymphadenopathy is markedly reduced in size. It now measures may be 2 x 2 centimeters. Abdomen is soft. I cannot  palpate her liver edge. There is no ascites. Back exam no tenderness over the spine. Extremities shows no clubbing cyanosis or edema. Skin exam no rashes. Neurological exam is nonfocal.  Lab Results  Component Value Date   WBC 7.8 06/25/2014   HGB 11.4* 06/25/2014   HCT 34.6* 06/25/2014   MCV 93 06/25/2014   PLT 462* 06/25/2014     Chemistry      Component Value Date/Time   NA 142 06/25/2014 0809   NA 137 04/09/2014 0450   K 3.9 06/25/2014 0809   K 3.5* 04/09/2014 0450   CL 104 06/25/2014 0809   CL 101 04/09/2014 0450   CO2 25 06/25/2014 0809   CO2 23 04/09/2014 0450   BUN 9 06/25/2014 0809   BUN 5* 04/09/2014 0450   CREATININE 0.6 06/25/2014 0809   CREATININE 0.59 04/09/2014 0450      Component Value Date/Time   CALCIUM 9.5 06/25/2014 0809   CALCIUM 9.3 04/09/2014 0450   ALKPHOS 221* 06/25/2014 0809   ALKPHOS 179* 04/07/2014 0630   AST 83* 06/25/2014 0809   AST 74* 04/07/2014 0630   ALT 19 06/25/2014 0809   ALT 34 04/07/2014 0630   BILITOT 0.90 06/25/2014 0809   BILITOT 0.7 04/07/2014 0630         Impression and Plan: Ms. Carrell is 58 year old Afro-American female with metastatic breast cancer. She is responding nicely. Again, him not to worry about the weight loss as if she is eating well. Her prealbumin is improving slowly. Her liver tests are improving.  I think that as we see her cancer continues to regress, she will gain some more weight.  We have to also see about repeating a MUGA scan to make sure that her ejection fraction for a heart is doing okay. I will do this up in a couple weeks.  I will plan to get her back to see [place] in another 3 weeks.   Volanda Napoleon, MD 6/29/20156:49 PM

## 2014-06-26 ENCOUNTER — Ambulatory Visit (HOSPITAL_BASED_OUTPATIENT_CLINIC_OR_DEPARTMENT_OTHER): Payer: BC Managed Care – PPO

## 2014-06-26 VITALS — BP 141/94 | HR 76 | Temp 96.9°F | Resp 16

## 2014-06-26 DIAGNOSIS — C50911 Malignant neoplasm of unspecified site of right female breast: Secondary | ICD-10-CM

## 2014-06-26 DIAGNOSIS — C7800 Secondary malignant neoplasm of unspecified lung: Secondary | ICD-10-CM

## 2014-06-26 DIAGNOSIS — C7951 Secondary malignant neoplasm of bone: Secondary | ICD-10-CM

## 2014-06-26 DIAGNOSIS — C7952 Secondary malignant neoplasm of bone marrow: Secondary | ICD-10-CM

## 2014-06-26 DIAGNOSIS — Z5189 Encounter for other specified aftercare: Secondary | ICD-10-CM

## 2014-06-26 DIAGNOSIS — C787 Secondary malignant neoplasm of liver and intrahepatic bile duct: Secondary | ICD-10-CM

## 2014-06-26 DIAGNOSIS — C50919 Malignant neoplasm of unspecified site of unspecified female breast: Secondary | ICD-10-CM

## 2014-06-26 MED ORDER — PEGFILGRASTIM INJECTION 6 MG/0.6ML
6.0000 mg | Freq: Once | SUBCUTANEOUS | Status: AC
  Administered 2014-06-26: 6 mg via SUBCUTANEOUS

## 2014-06-26 MED ORDER — PEGFILGRASTIM INJECTION 6 MG/0.6ML
SUBCUTANEOUS | Status: AC
  Filled 2014-06-26: qty 0.6

## 2014-06-26 NOTE — Patient Instructions (Signed)

## 2014-07-03 ENCOUNTER — Telehealth: Payer: Self-pay | Admitting: Dietician

## 2014-07-03 NOTE — Telephone Encounter (Signed)
Brief Outpatient Oncology Nutrition Note  Patient has been identified to be at risk on malnutrition screen.  Wt Readings from Last 10 Encounters:  06/25/14 145 lb (65.772 kg)  06/04/14 148 lb (67.132 kg)  05/14/14 153 lb (69.4 kg)  04/19/14 163 lb 9.6 oz (74.208 kg)  04/12/14 170 lb (77.111 kg)  04/05/14 169 lb (76.658 kg)  04/05/14 169 lb 4 oz (76.771 kg)  08/04/13 178 lb (80.74 kg)  07/04/13 181 lb 4 oz (82.214 kg)   Dx:  Metastatic breast cancer to liver and lungs.  Patient of [name].  Status post 3 cycles of Cytoxan/Taxotere/Herceptin/Perjeta  Zometa 4 mg IV Q3 weeks  Called patient due to continued weight loss.  Patient with a weight loss of 36 lbs in the past year (19%).  25 lbs of which have been since diagnosis 3 months ago (15%).  I spoke to patient 04/27/14 and she was only eating small amounts at that time and having diarrhea after chemo.  Patient is concerned about inability to gain weight.  Patient reports eating well currently but per diet recall patient usually only eats 2 meals, no snacks and occasional Ensure daily.  Amounts are not adequate for weight gain and may cause continued loss.    Recommended patient begin eating 3 meals plus Ensure if needed daily.  Snack as desired.   Patient had questions regarding sugar and will mail "Sugar and Cancer" article from [employer] along with Ensure coupons and contact information for the Outpatient Cancer Dietitian.  Antonieta Iba, RD, LDN

## 2014-07-11 ENCOUNTER — Ambulatory Visit (HOSPITAL_COMMUNITY): Payer: BC Managed Care – PPO

## 2014-07-12 ENCOUNTER — Ambulatory Visit (HOSPITAL_COMMUNITY)
Admission: RE | Admit: 2014-07-12 | Discharge: 2014-07-12 | Disposition: A | Discharge status: Home / Self Care | Payer: BC Managed Care – PPO | Source: Ambulatory Visit | Attending: Hematology & Oncology | Admitting: Hematology & Oncology

## 2014-07-12 DIAGNOSIS — Z09 Encounter for follow-up examination after completed treatment for conditions other than malignant neoplasm: Secondary | ICD-10-CM | POA: Insufficient documentation

## 2014-07-12 DIAGNOSIS — D509 Iron deficiency anemia, unspecified: Secondary | ICD-10-CM

## 2014-07-12 DIAGNOSIS — C50911 Malignant neoplasm of unspecified site of right female breast: Secondary | ICD-10-CM

## 2014-07-12 MED ORDER — TECHNETIUM TC 99M-LABELED RED BLOOD CELLS IV KIT
25.4000 | PACK | Freq: Once | INTRAVENOUS | Status: AC | PRN
  Administered 2014-07-12: 25 via INTRAVENOUS

## 2014-07-13 ENCOUNTER — Telehealth: Payer: Self-pay | Admitting: *Deleted

## 2014-07-13 NOTE — Telephone Encounter (Addendum)
Message copied by Lenn Sink on Fri Jul 13, 2014 10:45 AM ------      Message from: Burney Gauze R      Created: Thu Jul 12, 2014  5:21 PM       Call - heart is still functining very nicely.  pete ------Left voicemail informing pt that heart is functioning nicely

## 2014-07-16 ENCOUNTER — Other Ambulatory Visit: Payer: Self-pay | Admitting: Pharmacist

## 2014-07-16 ENCOUNTER — Ambulatory Visit (HOSPITAL_BASED_OUTPATIENT_CLINIC_OR_DEPARTMENT_OTHER): Payer: BC Managed Care – PPO | Admitting: Family

## 2014-07-16 ENCOUNTER — Ambulatory Visit: Payer: Self-pay | Admitting: Family

## 2014-07-16 ENCOUNTER — Ambulatory Visit (HOSPITAL_BASED_OUTPATIENT_CLINIC_OR_DEPARTMENT_OTHER): Payer: Medicaid Other

## 2014-07-16 ENCOUNTER — Other Ambulatory Visit (HOSPITAL_BASED_OUTPATIENT_CLINIC_OR_DEPARTMENT_OTHER): Payer: BC Managed Care – PPO | Admitting: Lab

## 2014-07-16 ENCOUNTER — Encounter: Payer: Self-pay | Admitting: Family

## 2014-07-16 VITALS — BP 151/94 | HR 88 | Temp 98.9°F | Resp 14 | Ht 64.0 in | Wt 143.0 lb

## 2014-07-16 DIAGNOSIS — Z5111 Encounter for antineoplastic chemotherapy: Secondary | ICD-10-CM | POA: Diagnosis not present

## 2014-07-16 DIAGNOSIS — E876 Hypokalemia: Secondary | ICD-10-CM

## 2014-07-16 DIAGNOSIS — C50919 Malignant neoplasm of unspecified site of unspecified female breast: Secondary | ICD-10-CM

## 2014-07-16 DIAGNOSIS — D509 Iron deficiency anemia, unspecified: Secondary | ICD-10-CM

## 2014-07-16 DIAGNOSIS — C50911 Malignant neoplasm of unspecified site of right female breast: Secondary | ICD-10-CM

## 2014-07-16 DIAGNOSIS — C7952 Secondary malignant neoplasm of bone marrow: Secondary | ICD-10-CM

## 2014-07-16 DIAGNOSIS — C7951 Secondary malignant neoplasm of bone: Secondary | ICD-10-CM | POA: Diagnosis not present

## 2014-07-16 DIAGNOSIS — Z5112 Encounter for antineoplastic immunotherapy: Secondary | ICD-10-CM | POA: Diagnosis present

## 2014-07-16 DIAGNOSIS — C787 Secondary malignant neoplasm of liver and intrahepatic bile duct: Secondary | ICD-10-CM | POA: Diagnosis not present

## 2014-07-16 LAB — CBC WITH DIFFERENTIAL (CANCER CENTER ONLY)
BASO#: 0 10*3/uL (ref 0.0–0.2)
BASO%: 0.1 % (ref 0.0–2.0)
EOS%: 0 % (ref 0.0–7.0)
Eosinophils Absolute: 0 10*3/uL (ref 0.0–0.5)
HCT: 31.4 % — ABNORMAL LOW (ref 34.8–46.6)
HGB: 10.6 g/dL — ABNORMAL LOW (ref 11.6–15.9)
LYMPH#: 0.4 10*3/uL — ABNORMAL LOW (ref 0.9–3.3)
LYMPH%: 5.5 % — ABNORMAL LOW (ref 14.0–48.0)
MCH: 31.4 pg (ref 26.0–34.0)
MCHC: 33.8 g/dL (ref 32.0–36.0)
MCV: 93 fL (ref 81–101)
MONO#: 0.1 10*3/uL (ref 0.1–0.9)
MONO%: 0.7 % (ref 0.0–13.0)
NEUT#: 6.2 10*3/uL (ref 1.5–6.5)
NEUT%: 93.7 % — ABNORMAL HIGH (ref 39.6–80.0)
PLATELETS: 427 10*3/uL — AB (ref 145–400)
RBC: 3.38 10*6/uL — AB (ref 3.70–5.32)
RDW: 19.4 % — ABNORMAL HIGH (ref 11.1–15.7)
WBC: 6.7 10*3/uL (ref 3.9–10.0)

## 2014-07-16 LAB — LACTATE DEHYDROGENASE: LDH: 445 U/L — AB (ref 94–250)

## 2014-07-16 LAB — FERRITIN CHCC: Ferritin: 1984 ng/ml — ABNORMAL HIGH (ref 9–269)

## 2014-07-16 LAB — CMP (CANCER CENTER ONLY)
ALBUMIN: 3.4 g/dL (ref 3.3–5.5)
ALT(SGPT): 15 U/L (ref 10–47)
AST: 42 U/L — ABNORMAL HIGH (ref 11–38)
Alkaline Phosphatase: 140 U/L — ABNORMAL HIGH (ref 26–84)
BUN, Bld: 12 mg/dL (ref 7–22)
CALCIUM: 9.3 mg/dL (ref 8.0–10.3)
CHLORIDE: 103 meq/L (ref 98–108)
CO2: 24 meq/L (ref 18–33)
Creat: 0.7 mg/dl (ref 0.6–1.2)
Glucose, Bld: 199 mg/dL — ABNORMAL HIGH (ref 73–118)
POTASSIUM: 2.5 meq/L — AB (ref 3.3–4.7)
Sodium: 138 mEq/L (ref 128–145)
Total Bilirubin: 0.7 mg/dl (ref 0.20–1.60)
Total Protein: 7.1 g/dL (ref 6.4–8.1)

## 2014-07-16 LAB — PREALBUMIN: Prealbumin: 16 mg/dL — ABNORMAL LOW (ref 17.0–34.0)

## 2014-07-16 LAB — IRON AND TIBC CHCC
%SAT: 14 % — ABNORMAL LOW (ref 21–57)
Iron: 38 ug/dL — ABNORMAL LOW (ref 41–142)
TIBC: 270 ug/dL (ref 236–444)
UIBC: 232 ug/dL (ref 120–384)

## 2014-07-16 LAB — CANCER ANTIGEN 27.29: CA 27.29: 579 U/mL — ABNORMAL HIGH (ref 0–39)

## 2014-07-16 MED ORDER — SODIUM CHLORIDE 0.9 % IV SOLN
600.0000 mg/m2 | Freq: Once | INTRAVENOUS | Status: AC
  Administered 2014-07-16: 1120 mg via INTRAVENOUS
  Filled 2014-07-16: qty 56

## 2014-07-16 MED ORDER — DOCETAXEL CHEMO INJECTION 160 MG/16ML
75.0000 mg/m2 | Freq: Once | INTRAVENOUS | Status: AC
  Administered 2014-07-16: 140 mg via INTRAVENOUS
  Filled 2014-07-16: qty 14

## 2014-07-16 MED ORDER — ACETAMINOPHEN 325 MG PO TABS
ORAL_TABLET | ORAL | Status: AC
  Filled 2014-07-16: qty 2

## 2014-07-16 MED ORDER — SODIUM CHLORIDE 0.9 % IV SOLN
Freq: Once | INTRAVENOUS | Status: AC
  Administered 2014-07-16: 10:00:00 via INTRAVENOUS

## 2014-07-16 MED ORDER — SODIUM CHLORIDE 0.9 % IJ SOLN
10.0000 mL | INTRAMUSCULAR | Status: DC | PRN
  Filled 2014-07-16: qty 10

## 2014-07-16 MED ORDER — ACETAMINOPHEN 325 MG PO TABS
650.0000 mg | ORAL_TABLET | Freq: Once | ORAL | Status: AC
  Administered 2014-07-16: 650 mg via ORAL

## 2014-07-16 MED ORDER — POTASSIUM CHLORIDE CRYS ER 20 MEQ PO TBCR
40.0000 meq | EXTENDED_RELEASE_TABLET | Freq: Once | ORAL | Status: AC
  Administered 2014-07-16: 40 meq via ORAL
  Filled 2014-07-16: qty 2

## 2014-07-16 MED ORDER — DIPHENHYDRAMINE HCL 25 MG PO CAPS
50.0000 mg | ORAL_CAPSULE | Freq: Once | ORAL | Status: AC
  Administered 2014-07-16: 50 mg via ORAL

## 2014-07-16 MED ORDER — POTASSIUM CHLORIDE CRYS ER 20 MEQ PO TBCR: 20.0000 meq | EXTENDED_RELEASE_TABLET | ORAL | Status: DC

## 2014-07-16 MED ORDER — TRASTUZUMAB CHEMO INJECTION 440 MG
6.0000 mg/kg | Freq: Once | INTRAVENOUS | Status: AC
  Administered 2014-07-16: 399 mg via INTRAVENOUS
  Filled 2014-07-16: qty 19

## 2014-07-16 MED ORDER — ZOLEDRONIC ACID 4 MG/100ML IV SOLN
4.0000 mg | Freq: Once | INTRAVENOUS | Status: AC
  Administered 2014-07-16: 4 mg via INTRAVENOUS
  Filled 2014-07-16: qty 100

## 2014-07-16 MED ORDER — HEPARIN SOD (PORK) LOCK FLUSH 100 UNIT/ML IV SOLN
500.0000 [IU] | Freq: Once | INTRAVENOUS | Status: DC | PRN
  Filled 2014-07-16: qty 5

## 2014-07-16 MED ORDER — ONDANSETRON 16 MG/50ML IVPB (CHCC)
16.0000 mg | Freq: Once | INTRAVENOUS | Status: AC
  Administered 2014-07-16: 16 mg via INTRAVENOUS

## 2014-07-16 MED ORDER — DIPHENHYDRAMINE HCL 25 MG PO CAPS
ORAL_CAPSULE | ORAL | Status: AC
  Filled 2014-07-16: qty 1

## 2014-07-16 MED ORDER — DEXAMETHASONE SODIUM PHOSPHATE 20 MG/5ML IJ SOLN
INTRAMUSCULAR | Status: AC
  Filled 2014-07-16: qty 5

## 2014-07-16 MED ORDER — SODIUM CHLORIDE 0.9 % IV SOLN
Freq: Once | INTRAVENOUS | Status: DC
  Filled 2014-07-16: qty 500

## 2014-07-16 MED ORDER — DEXAMETHASONE SODIUM PHOSPHATE 20 MG/5ML IJ SOLN
20.0000 mg | Freq: Once | INTRAMUSCULAR | Status: AC
  Administered 2014-07-16: 20 mg via INTRAVENOUS

## 2014-07-16 MED ORDER — POTASSIUM CHLORIDE 10 MEQ/100ML IV SOLN: 10.0000 meq | INTRAVENOUS | Status: DC

## 2014-07-16 MED ORDER — SODIUM CHLORIDE 0.9 % IV SOLN
420.0000 mg | Freq: Once | INTRAVENOUS | Status: AC
  Administered 2014-07-16: 420 mg via INTRAVENOUS
  Filled 2014-07-16: qty 14

## 2014-07-16 MED ORDER — ONDANSETRON 16 MG/50ML IVPB (CHCC)
INTRAVENOUS | Status: AC
  Filled 2014-07-16: qty 16

## 2014-07-16 NOTE — Patient Instructions (Signed)
[facility] Discharge Instructions for Patients Receiving Chemotherapy  Today you received the following chemotherapy agents Cytoxan, Taxotere, Perjeta, Herceptin,   To help prevent nausea and vomiting after your treatment, we encourage you to take your nausea medication as directed.   If you develop nausea and vomiting that is not controlled by your nausea medication, call the clinic.   BELOW ARE SYMPTOMS THAT SHOULD BE REPORTED IMMEDIATELY:  *FEVER GREATER THAN 100.5 F  *CHILLS WITH OR WITHOUT FEVER  NAUSEA AND VOMITING THAT IS NOT CONTROLLED WITH YOUR NAUSEA MEDICATION  *UNUSUAL SHORTNESS OF BREATH  *UNUSUAL BRUISING OR BLEEDING  TENDERNESS IN MOUTH AND THROAT WITH OR WITHOUT PRESENCE OF ULCERS  *URINARY PROBLEMS  *BOWEL PROBLEMS  UNUSUAL RASH Items with * indicate a potential emergency and should be followed up as soon as possible.  Feel free to call the clinic you have any questions or concerns. The clinic phone number is [phone].

## 2014-07-16 NOTE — Progress Notes (Signed)
[place]  [phone] [phone]  ID: Juliana Boling OB: January 30, 1956 MR#: [id number] Patient Care Team: Biagio Borg, MD as PCP - General (Internal Medicine)  DIAGNOSIS: Metastatic breast cancer-ER positive/HER-2 positive  INTERVAL HISTORY: Ms. Rutkowski is back here alone today for follow-up and Day 1, Cycle 5 of chemo. She states that she is feeling good. She does get tired at times and has to sit down frequently. She denies fever, chills, n/v, rash, cough, headaches, dizziness, SOB, chest pain, palpitations, abdominal pain, constipation, diarrhea, problems urinating, blood in urine or stool. She denies any swelling, tenderness, numbness or tingling. She states that her right breast mass is getting smaller and she is still having some mild bloody drainage from it. She keeps a bandage over the wound. Her appetite is good, she has lost 2 lbs since her last visit. She states that she is trying to drink lots of water and stay hydrated. Her repeat CT scan in June show improvement in all of her disease areas. She's had nice improvement in the right breast mass in the right axillary lymph nodes. She's had improvement in her liver metastases. Her CA 27.29 in June was down to 1419 and her LDH was also starting to come down.   CURRENT TREATMENT: Status post 3 cycles of Cytoxan/Taxotere/Herceptin/Perjeta  Zometa 4 mg IV Q3 weeks  REVIEW OF SYSTEMS: All other 10 point review of systems was negative.  PAST MEDICAL HISTORY: Past Medical History  Diagnosis Date  . Arthritis   . Depression   . Fainting   . Headache   . Hypertension   . Migraines   . History of blood transfusion   . Gastric ulcer   . Breast cancer metastasized to multiple sites 04/12/2014   PAST SURGICAL HISTORY: Past Surgical History  Procedure Laterality Date  . Abdominal hysterectomy  15 years go    partial   FAMILY HISTORY Family History  Problem Relation Age of Onset  .  Arthritis Other   . Hypertension Other   . Hypertension Other   . Heart disease Other   . Stroke Other    GYNECOLOGIC HISTORY:  No LMP recorded. Patient has had a hysterectomy.   SOCIAL HISTORY:  History   Social History  . Marital Status: Married    Spouse Name: N/A    Number of Children: N/A  . Years of Education: N/A   Occupational History  .      no work for 4 to 5 years   Social History Main Topics  . Smoking status: Never Smoker   . Smokeless tobacco: Never Used     Comment: never used tobacco  . Alcohol Use: No  . Drug Use: No  . Sexual Activity: Not Currently   Other Topics Concern  . Not on file   Social History Narrative  . No narrative on file   ADVANCED DIRECTIVES: <no information>  HEALTH MAINTENANCE: History  Substance Use Topics  . Smoking status: Never Smoker   . Smokeless tobacco: Never Used     Comment: never used tobacco  . Alcohol Use: No   Colonoscopy: PAP: Bone density: Lipid panel:  Allergies  Allergen Reactions  . Hydrocodone     "makes me crawl on the floor"  . Aspirin Other (See Comments)    Due to ulcers  . Penicillins Swelling   Current Outpatient Prescriptions  Medication Sig Dispense Refill  . amLODipine (NORVASC) 5 MG tablet Take 5 mg by mouth every morning.      Marland Kitchen  dexamethasone (DECADRON) 4 MG tablet Take 2 tablets (8 mg total) by mouth 2 (two) times daily. Start the day before Taxotere. Then again the day after chemo for 3 days.  30 tablet  1  . Diphenhydramine-APAP, sleep, (HEADACHE RELIEF PM) 38-500 MG TABS Take 2 tablets by mouth daily as needed (For headaches.).      Marland Kitchen escitalopram (LEXAPRO) 20 MG tablet Take 20 mg by mouth every morning.      . lidocaine-prilocaine (EMLA) cream Apply 1 application topically as needed. Apply quarter sized amount to portacath site at least one hour prior to treatment .  Cover with saran wrap  30 g  0  . lisinopril (PRINIVIL,ZESTRIL) 40 MG tablet Take 40 mg by mouth every morning.       Marland Kitchen LORazepam (ATIVAN) 0.5 MG tablet Take 1 tablet (0.5 mg total) by mouth every 6 (six) hours as needed (Nausea or vomiting).  30 tablet  0  . NON FORMULARY Take by mouth every morning. OTC  Iron tab      . oxyCODONE (OXY IR/ROXICODONE) 5 MG immediate release tablet Take 1 tablet (5 mg total) by mouth every 6 (six) hours as needed for severe pain.  30 tablet  0  . pantoprazole (PROTONIX) 40 MG tablet Take 1 tablet (40 mg total) by mouth daily.  30 tablet  6  . SUMAtriptan (IMITREX) 100 MG tablet Take 1 tablet (100 mg total) by mouth every 2 (two) hours as needed for migraine.  10 tablet  3  . ondansetron (ZOFRAN) 8 MG tablet Take 1 tablet (8 mg total) by mouth 2 (two) times daily. Start the day after chemo for 3 days. Then take as needed for nausea or vomiting.  30 tablet  1  . potassium chloride SA (K-DUR,KLOR-CON) 20 MEQ tablet Take 1 tablet (20 mEq total) by mouth 1 day or 1 dose.  24 tablet  0  . prochlorperazine (COMPAZINE) 10 MG tablet Take 1 tablet (10 mg total) by mouth every 6 (six) hours as needed (Nausea or vomiting).  30 tablet  1   Current Facility-Administered Medications  Medication Dose Route Frequency Provider Last Rate Last Dose  . potassium chloride 10 mEq in 100 mL IVPB  10 mEq Intravenous Q1 Hr x 4 Eliezer Bottom, NP       OBJECTIVE: Filed Vitals:   07/16/14 0857  BP: 151/94  Pulse: 88  Temp: 98.9 F (37.2 C)  Resp: 14   Body mass index is 24.53 kg/(m^2). ECOG FS:0 - Asymptomatic Ocular: Sclerae unicteric, pupils equal, round and reactive to light Ear-nose-throat: Oropharynx clear, dentition fair Lymphatic: No cervical or supraclavicular adenopathy Lungs no rales or rhonchi, good excursion bilaterally Heart regular rate and rhythm, no murmur appreciated Abd soft, nontender, positive bowel sounds MSK no focal spinal tenderness, no joint edema Neuro: non-focal, well-oriented, appropriate affect Breasts: Deferred  LAB RESULTS: CMP     Component Value  Date/Time   NA 138 07/16/2014 0837   NA 137 04/09/2014 0450   K 2.5* 07/16/2014 0837   K 3.5* 04/09/2014 0450   CL 103 07/16/2014 0837   CL 101 04/09/2014 0450   CO2 24 07/16/2014 0837   CO2 23 04/09/2014 0450   GLUCOSE 199* 07/16/2014 0837   GLUCOSE 98 04/09/2014 0450   BUN 12 07/16/2014 0837   BUN 5* 04/09/2014 0450   CREATININE 0.7 07/16/2014 0837   CREATININE 0.59 04/09/2014 0450   CALCIUM 9.3 07/16/2014 0837   CALCIUM 9.3 04/09/2014 0450  PROT 7.1 07/16/2014 0837   PROT 6.7 04/07/2014 0630   ALBUMIN 2.4* 04/07/2014 0630   AST 42* 07/16/2014 0837   AST 74* 04/07/2014 0630   ALT 15 07/16/2014 0837   ALT 34 04/07/2014 0630   ALKPHOS 140* 07/16/2014 0837   ALKPHOS 179* 04/07/2014 0630   BILITOT 0.70 07/16/2014 0837   BILITOT 0.7 04/07/2014 0630   GFRNONAA >90 04/09/2014 0450   GFRAA >90 04/09/2014 0450   No results found for this basename: SPEP, UPEP,  kappa and lambda light chains   Lab Results  Component Value Date   WBC 6.7 07/16/2014   NEUTROABS 6.2 07/16/2014   HGB 10.6* 07/16/2014   HCT 31.4* 07/16/2014   MCV 93 07/16/2014   PLT 427* 07/16/2014   Lab Results  Component Value Date   LABCA2 1419* 06/25/2014   No components found with this basename: [id number]   No results found for this basename: INR,  in the last 168 hours  STUDIES: Ct Chest W Contrast  06/21/2014   CLINICAL DATA:  Metastatic breast cancer  EXAM: CT CHEST, ABDOMEN, AND PELVIS WITH CONTRAST  TECHNIQUE: Multidetector CT imaging of the chest, abdomen and pelvis was performed following the standard protocol during bolus administration of intravenous contrast.  CONTRAST:  142m OMNIPAQUE IOHEXOL 300 MG/ML  SOLN  COMPARISON:  04/08/2014.  FINDINGS: CT CHEST FINDINGS  There are probable radiation changes involving the right breast with skin thickening and interstitial thickening. The right lateral breast mass is smaller. It previously measured 7 x 6 cm and now measures 3.8 x 3.7 cm. Bulky axillary lymphadenopathy is also improved. The  nodal mass previously measured 6.3 x 5.8 cm and now measures 4.4 x 3.7 cm. A left-sided Port-A-Cath is in place. No left breast mass or left axillary adenopathy. No enlarged supraclavicular nodes. The subpectoral nodes on the right are smaller.  The osseous metastatic disease appears relatively stable in the chest. There is extensive involvement of the T2 vertebral body can posterior elements. I do not see any obvious tumor in the canal. MRI may be helpful for further evaluation. This could be a potential level for spinal canal compromise.  The heart is normal in size. No pericardial effusion. Stable prevascular adenopathy measuring a maximum of 3.5 by 1.5 cm. The pretracheal node is also stable. Small bilateral hilar nodes are unchanged. The aorta is normal in caliber. The major branch vessels are patent. The esophagus is grossly normal.  Examination of the lung parenchyma demonstrates improved appearance of the metastatic pulmonary nodules. The 13 mm right upper lobe lesion now measures 10 mm. The 5 mm nodule that was present on the same image is no longer visualized. Other smaller lesions have resolved. The left upper lobe lesion adjacent to the pericardium measured 18 mm and now measures 12 mm. A 14 mm nodule seen in the right lower lobe on the prior study now measures 8 mm. There are small bilateral pleural effusions and overlying subsegmental atelectasis.  CT ABDOMEN AND PELVIS FINDINGS  Diffuse hepatic metastatic disease is again demonstrated. A right hepatic lobe lesion on image number 56 previously measured 5.2 x 3.7 cm and now measures 4.6 x 3.2 cm. Extra capsular tumor is again demonstrated. The large conglomerate of tumor in the lateral segment of the left hepatic lobe previously measured 9.6 x 9.5 cm and now measures 9.1 x 8.4 cm. No definite new lesions. The small splenic lesions are stable. Pancreas is normal. No common bile duct or pancreatic duct dilatation.  The adrenal glands and kidneys are  unremarkable and unchanged. 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  The stomach, duodenum, small bowel and colon are grossly normal and stable. The appendix is normal. No mesenteric or retroperitoneal mass or adenopathy. Small scattered lymph nodes are noted. The liver contour is irregular. This could be due to metastatic disease or underline cirrhosis. A few small scattered omental implants are noted. These appears stable. There is a small amount of free abdominal and pelvic fluid. The uterus is surgically absent. Both ovaries appear normal. The bladder is normal. No pelvic mass. No inguinal mass or adenopathy.  Stable osseous metastatic disease involving the spine and pelvis. The S1 lesion demonstrates a small pathologic fracture.  IMPRESSION: 1. Metastatic breast cancer with overall improved appearance since the prior CT examination. The right lateral breast mass is smaller and the right axillary lymphadenopathy has regressed. The pulmonary nodules demonstrate the most significant improvement. 2. Slight interval decrease in hepatic metastatic disease. 3. The metastatic bone disease appears relatively stable. There is a new pathologic fracture involving S1. No definite new bone lesions. The vertebral body and posterior element metastatic disease at T2 needs close observation. MRI may be helpful to better evaluate the spinal canal involvement and to follow for any progression.   Electronically Signed   By: Kalman Jewels M.D.   On: 06/21/2014 14:28   Nm Cardiac Muga Rest  07/12/2014   CLINICAL DATA:  Potential cardiotoxic chemotherapy  EXAM: NUCLEAR MEDICINE CARDIAC BLOOD POOL IMAGING (MUGA)  TECHNIQUE: Cardiac multi-gated acquisition was performed at rest following intravenous injection of Tc-59mlabeled red blood cells.  RADIOPHARMACEUTICALS:  25.4 MCiTc-931mn-vitro labeled red blood cells.  COMPARISON:  04/18/2014  FINDINGS: No focal wall motion abnormality.  Calculated left ventricular ejection  fraction equals 61% not changed 61% on 04/18/2014.  IMPRESSION: Left ventricular ejection fraction equals 61%, unchanged.   Electronically Signed   By: StSuzy Bouchard.D.   On: 07/12/2014 13:15   Ct Abdomen Pelvis W Contrast  06/21/2014   CLINICAL DATA:  Metastatic breast cancer  EXAM: CT CHEST, ABDOMEN, AND PELVIS WITH CONTRAST  TECHNIQUE: Multidetector CT imaging of the chest, abdomen and pelvis was performed following the standard protocol during bolus administration of intravenous contrast.  CONTRAST:  10022mMNIPAQUE IOHEXOL 300 MG/ML  SOLN  COMPARISON:  04/08/2014.  FINDINGS: CT CHEST FINDINGS  There are probable radiation changes involving the right breast with skin thickening and interstitial thickening. The right lateral breast mass is smaller. It previously measured 7 x 6 cm and now measures 3.8 x 3.7 cm. Bulky axillary lymphadenopathy is also improved. The nodal mass previously measured 6.3 x 5.8 cm and now measures 4.4 x 3.7 cm. A left-sided Port-A-Cath is in place. No left breast mass or left axillary adenopathy. No enlarged supraclavicular nodes. The subpectoral nodes on the right are smaller.  The osseous metastatic disease appears relatively stable in the chest. There is extensive involvement of the T2 vertebral body can posterior elements. I do not see any obvious tumor in the canal. MRI may be helpful for further evaluation. This could be a potential level for spinal canal compromise.  The heart is normal in size. No pericardial effusion. Stable prevascular adenopathy measuring a maximum of 3.5 by 1.5 cm. The pretracheal node is also stable. Small bilateral hilar nodes are unchanged. The aorta is normal in caliber. The major branch vessels are patent. The esophagus is grossly normal.  Examination of the lung parenchyma demonstrates improved appearance of  the metastatic pulmonary nodules. The 13 mm right upper lobe lesion now measures 10 mm. The 5 mm nodule that was present on the same image is  no longer visualized. Other smaller lesions have resolved. The left upper lobe lesion adjacent to the pericardium measured 18 mm and now measures 12 mm. A 14 mm nodule seen in the right lower lobe on the prior study now measures 8 mm. There are small bilateral pleural effusions and overlying subsegmental atelectasis.  CT ABDOMEN AND PELVIS FINDINGS  Diffuse hepatic metastatic disease is again demonstrated. A right hepatic lobe lesion on image number 56 previously measured 5.2 x 3.7 cm and now measures 4.6 x 3.2 cm. Extra capsular tumor is again demonstrated. The large conglomerate of tumor in the lateral segment of the left hepatic lobe previously measured 9.6 x 9.5 cm and now measures 9.1 x 8.4 cm. No definite new lesions. The small splenic lesions are stable. Pancreas is normal. No common bile duct or pancreatic duct dilatation. The adrenal glands and kidneys are unremarkable and unchanged. 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  The stomach, duodenum, small bowel and colon are grossly normal and stable. The appendix is normal. No mesenteric or retroperitoneal mass or adenopathy. Small scattered lymph nodes are noted. The liver contour is irregular. This could be due to metastatic disease or underline cirrhosis. A few small scattered omental implants are noted. These appears stable. There is a small amount of free abdominal and pelvic fluid. The uterus is surgically absent. Both ovaries appear normal. The bladder is normal. No pelvic mass. No inguinal mass or adenopathy.  Stable osseous metastatic disease involving the spine and pelvis. The S1 lesion demonstrates a small pathologic fracture.  IMPRESSION: 1. Metastatic breast cancer with overall improved appearance since the prior CT examination. The right lateral breast mass is smaller and the right axillary lymphadenopathy has regressed. The pulmonary nodules demonstrate the most significant improvement. 2. Slight interval decrease in hepatic metastatic  disease. 3. The metastatic bone disease appears relatively stable. There is a new pathologic fracture involving S1. No definite new bone lesions. The vertebral body and posterior element metastatic disease at T2 needs close observation. MRI may be helpful to better evaluate the spinal canal involvement and to follow for any progression.   Electronically Signed   By: Kalman Jewels M.D.   On: 06/21/2014 14:28   ASSESSMENT/PLAN: Ms. Panameno is 58 year old 19 female with metastatic breast cancer. She is responding nicely.  We will proceed with Day 1, Cycle 5 of chemo today as planned.  Her July MUGA scan showed EF of 61%.  CBC was unremarkable. CMP showed K+ 2.5. We will giver her 40 meq IV over 4-5 hrs today. I also sent a prescription to her pharmacy for KDUR 40 meq today and then 20 meq daily until we see her back on August 10th.    We will see her back in 3 weeks.  All questions were answered and she is in agreement with the plan.  She knows to call here with any questions or concerns and to go to the ED in the event of an emergency. We can certainly see her again sooner if needed.   Eliezer Bottom, NP 07/16/2014 10:10 AM

## 2014-07-17 ENCOUNTER — Ambulatory Visit (HOSPITAL_BASED_OUTPATIENT_CLINIC_OR_DEPARTMENT_OTHER): Payer: Medicaid Other

## 2014-07-17 VITALS — BP 150/88 | HR 74 | Temp 98.1°F | Resp 16

## 2014-07-17 DIAGNOSIS — Z5189 Encounter for other specified aftercare: Secondary | ICD-10-CM

## 2014-07-17 DIAGNOSIS — C50911 Malignant neoplasm of unspecified site of right female breast: Secondary | ICD-10-CM

## 2014-07-17 DIAGNOSIS — C50919 Malignant neoplasm of unspecified site of unspecified female breast: Secondary | ICD-10-CM

## 2014-07-17 MED ORDER — PEGFILGRASTIM INJECTION 6 MG/0.6ML
SUBCUTANEOUS | Status: AC
  Filled 2014-07-17: qty 0.6

## 2014-07-17 MED ORDER — PEGFILGRASTIM INJECTION 6 MG/0.6ML
6.0000 mg | Freq: Once | SUBCUTANEOUS | Status: AC
  Administered 2014-07-17: 6 mg via SUBCUTANEOUS

## 2014-07-17 NOTE — Patient Instructions (Signed)

## 2014-07-20 ENCOUNTER — Encounter: Payer: Self-pay | Admitting: *Deleted

## 2014-07-20 NOTE — Progress Notes (Signed)
Pt called complaining of "a cold." Pt denied any fever; Pt stated she has checked her temperature "several times." Informed pt to go to an urgent care of her PCP. Informed pt to call us if she has a fever.

## 2014-08-03 ENCOUNTER — Other Ambulatory Visit: Payer: Self-pay | Admitting: *Deleted

## 2014-08-03 DIAGNOSIS — C50919 Malignant neoplasm of unspecified site of unspecified female breast: Secondary | ICD-10-CM

## 2014-08-06 ENCOUNTER — Encounter: Payer: Self-pay | Admitting: Family

## 2014-08-06 ENCOUNTER — Other Ambulatory Visit (HOSPITAL_BASED_OUTPATIENT_CLINIC_OR_DEPARTMENT_OTHER): Payer: BC Managed Care – PPO | Admitting: Lab

## 2014-08-06 ENCOUNTER — Ambulatory Visit (HOSPITAL_BASED_OUTPATIENT_CLINIC_OR_DEPARTMENT_OTHER): Payer: Medicaid Other

## 2014-08-06 ENCOUNTER — Ambulatory Visit (HOSPITAL_BASED_OUTPATIENT_CLINIC_OR_DEPARTMENT_OTHER): Payer: Medicaid Other | Admitting: Family

## 2014-08-06 VITALS — BP 157/91 | HR 93 | Temp 98.0°F | Resp 14 | Ht 64.0 in | Wt 145.0 lb

## 2014-08-06 DIAGNOSIS — Z5112 Encounter for antineoplastic immunotherapy: Secondary | ICD-10-CM

## 2014-08-06 DIAGNOSIS — S21001D Unspecified open wound of right breast, subsequent encounter: Secondary | ICD-10-CM

## 2014-08-06 DIAGNOSIS — C50919 Malignant neoplasm of unspecified site of unspecified female breast: Secondary | ICD-10-CM

## 2014-08-06 DIAGNOSIS — C50911 Malignant neoplasm of unspecified site of right female breast: Secondary | ICD-10-CM

## 2014-08-06 DIAGNOSIS — C787 Secondary malignant neoplasm of liver and intrahepatic bile duct: Secondary | ICD-10-CM

## 2014-08-06 DIAGNOSIS — S21001A Unspecified open wound of right breast, initial encounter: Secondary | ICD-10-CM | POA: Insufficient documentation

## 2014-08-06 DIAGNOSIS — C7952 Secondary malignant neoplasm of bone marrow: Secondary | ICD-10-CM

## 2014-08-06 DIAGNOSIS — S21009A Unspecified open wound of unspecified breast, initial encounter: Secondary | ICD-10-CM

## 2014-08-06 DIAGNOSIS — C7951 Secondary malignant neoplasm of bone: Secondary | ICD-10-CM

## 2014-08-06 LAB — CBC WITH DIFFERENTIAL (CANCER CENTER ONLY)
BASO#: 0 10*3/uL (ref 0.0–0.2)
BASO%: 0.2 % (ref 0.0–2.0)
EOS%: 0 % (ref 0.0–7.0)
Eosinophils Absolute: 0 10*3/uL (ref 0.0–0.5)
HCT: 31.4 % — ABNORMAL LOW (ref 34.8–46.6)
HGB: 10.4 g/dL — ABNORMAL LOW (ref 11.6–15.9)
LYMPH#: 0.3 10*3/uL — ABNORMAL LOW (ref 0.9–3.3)
LYMPH%: 7.6 % — ABNORMAL LOW (ref 14.0–48.0)
MCH: 31.4 pg (ref 26.0–34.0)
MCHC: 33.1 g/dL (ref 32.0–36.0)
MCV: 95 fL (ref 81–101)
MONO#: 0 10*3/uL — ABNORMAL LOW (ref 0.1–0.9)
MONO%: 0.7 % (ref 0.0–13.0)
NEUT#: 3.9 10*3/uL (ref 1.5–6.5)
NEUT%: 91.5 % — ABNORMAL HIGH (ref 39.6–80.0)
Platelets: 294 10*3/uL (ref 145–400)
RBC: 3.31 10*6/uL — ABNORMAL LOW (ref 3.70–5.32)
RDW: 18.7 % — ABNORMAL HIGH (ref 11.1–15.7)
WBC: 4.2 10*3/uL (ref 3.9–10.0)

## 2014-08-06 LAB — CANCER ANTIGEN 27.29: CA 27.29: 317 U/mL — ABNORMAL HIGH (ref 0–39)

## 2014-08-06 LAB — CMP (CANCER CENTER ONLY)
ALBUMIN: 3.3 g/dL (ref 3.3–5.5)
ALK PHOS: 120 U/L — AB (ref 26–84)
ALT: 19 U/L (ref 10–47)
AST: 56 U/L — ABNORMAL HIGH (ref 11–38)
BUN: 10 mg/dL (ref 7–22)
CO2: 22 mEq/L (ref 18–33)
Calcium: 8.5 mg/dL (ref 8.0–10.3)
Chloride: 106 mEq/L (ref 98–108)
Creat: 0.7 mg/dl (ref 0.6–1.2)
Glucose, Bld: 179 mg/dL — ABNORMAL HIGH (ref 73–118)
Potassium: 3.9 mEq/L (ref 3.3–4.7)
SODIUM: 140 meq/L (ref 128–145)
Total Bilirubin: 0.7 mg/dl (ref 0.20–1.60)
Total Protein: 6.9 g/dL (ref 6.4–8.1)

## 2014-08-06 LAB — PREALBUMIN: Prealbumin: 19 mg/dL (ref 17.0–34.0)

## 2014-08-06 LAB — LACTATE DEHYDROGENASE: LDH: 391 U/L — ABNORMAL HIGH (ref 94–250)

## 2014-08-06 MED ORDER — SODIUM CHLORIDE 0.9 % IV SOLN
Freq: Once | INTRAVENOUS | Status: AC
  Administered 2014-08-06: 12:00:00 via INTRAVENOUS

## 2014-08-06 MED ORDER — ACETAMINOPHEN 325 MG PO TABS
ORAL_TABLET | ORAL | Status: AC
  Filled 2014-08-06: qty 2

## 2014-08-06 MED ORDER — SODIUM CHLORIDE 0.9 % IJ SOLN
3.0000 mL | INTRAMUSCULAR | Status: DC | PRN
  Filled 2014-08-06: qty 10

## 2014-08-06 MED ORDER — ONDANSETRON 16 MG/50ML IVPB (CHCC)
16.0000 mg | Freq: Once | INTRAVENOUS | Status: AC
  Administered 2014-08-06: 16 mg via INTRAVENOUS

## 2014-08-06 MED ORDER — DEXAMETHASONE SODIUM PHOSPHATE 20 MG/5ML IJ SOLN
INTRAMUSCULAR | Status: AC
  Filled 2014-08-06: qty 5

## 2014-08-06 MED ORDER — SODIUM CHLORIDE 0.9 % IV SOLN
6.0000 mg/kg | Freq: Once | INTRAVENOUS | Status: AC
  Administered 2014-08-06: 399 mg via INTRAVENOUS
  Filled 2014-08-06: qty 19

## 2014-08-06 MED ORDER — HEPARIN SOD (PORK) LOCK FLUSH 100 UNIT/ML IV SOLN
500.0000 [IU] | Freq: Once | INTRAVENOUS | Status: AC | PRN
  Administered 2014-08-06: 500 [IU]
  Filled 2014-08-06: qty 5

## 2014-08-06 MED ORDER — ONDANSETRON 16 MG/50ML IVPB (CHCC)
INTRAVENOUS | Status: AC
  Filled 2014-08-06: qty 16

## 2014-08-06 MED ORDER — SODIUM CHLORIDE 0.9 % IV SOLN
600.0000 mg/m2 | Freq: Once | INTRAVENOUS | Status: AC
  Administered 2014-08-06: 1120 mg via INTRAVENOUS
  Filled 2014-08-06: qty 56

## 2014-08-06 MED ORDER — DIPHENHYDRAMINE HCL 25 MG PO CAPS
ORAL_CAPSULE | ORAL | Status: AC
  Filled 2014-08-06: qty 2

## 2014-08-06 MED ORDER — ALTEPLASE 2 MG IJ SOLR
2.0000 mg | Freq: Once | INTRAMUSCULAR | Status: DC | PRN
  Filled 2014-08-06: qty 2

## 2014-08-06 MED ORDER — DOXYCYCLINE HYCLATE 100 MG PO TABS: 100.0000 mg | ORAL_TABLET | Freq: Two times a day (BID) | ORAL | Status: DC

## 2014-08-06 MED ORDER — DOCETAXEL CHEMO INJECTION 160 MG/16ML
75.0000 mg/m2 | Freq: Once | INTRAVENOUS | Status: AC
  Administered 2014-08-06: 140 mg via INTRAVENOUS
  Filled 2014-08-06: qty 14

## 2014-08-06 MED ORDER — DIPHENHYDRAMINE HCL 25 MG PO CAPS
50.0000 mg | ORAL_CAPSULE | Freq: Once | ORAL | Status: AC
Start: 2014-08-06 — End: 2014-08-06
  Administered 2014-08-06: 50 mg via ORAL

## 2014-08-06 MED ORDER — HEPARIN SOD (PORK) LOCK FLUSH 100 UNIT/ML IV SOLN
250.0000 [IU] | Freq: Once | INTRAVENOUS | Status: DC | PRN
  Filled 2014-08-06: qty 5

## 2014-08-06 MED ORDER — SODIUM CHLORIDE 0.9 % IV SOLN
420.0000 mg | Freq: Once | INTRAVENOUS | Status: AC
  Administered 2014-08-06: 420 mg via INTRAVENOUS
  Filled 2014-08-06: qty 14

## 2014-08-06 MED ORDER — ACETAMINOPHEN 325 MG PO TABS
650.0000 mg | ORAL_TABLET | Freq: Once | ORAL | Status: AC
  Administered 2014-08-06: 650 mg via ORAL

## 2014-08-06 MED ORDER — DEXAMETHASONE SODIUM PHOSPHATE 20 MG/5ML IJ SOLN
20.0000 mg | Freq: Once | INTRAMUSCULAR | Status: AC
  Administered 2014-08-06: 20 mg via INTRAVENOUS

## 2014-08-06 MED ORDER — SODIUM CHLORIDE 0.9 % IJ SOLN
10.0000 mL | INTRAMUSCULAR | Status: DC | PRN
  Administered 2014-08-06: 10 mL
  Filled 2014-08-06: qty 10

## 2014-08-06 NOTE — Progress Notes (Signed)
[place]  [phone] Fax:(336) [phone]  ID: Mary [name] OB: November 06, 1967 MR#: [id number] Patient Care Team: Biagio Borg, MD as PCP - General (Internal Medicine)  DIAGNOSIS: Metastatic breast cancer-ER positive/HER-2 positive  INTERVAL HISTORY: [name] is back today for follow-up and Day 1, Cycle 6 of chemo. She states that she is feeling good. She is concerned with the wounds on her left breast. It is draining a serosanguinous fluid and has a foul odor. She also states that her right breast feel "heavier and is red". She keeps a bandage over it at all times. She denies fever, chills, n/v, rash, cough, headaches, dizziness, SOB, chest pain, palpitations, abdominal pain, constipation, diarrhea, problems urinating, blood in urine or stool. She denies any swelling, tenderness or numbness. She has had some intermittent tingling in her fingertips that she describes as mild and tolerable. Her appetite is good, she has gained 2 lbs since her last visit. Her CA 27.29 in July was down to 579 and her LDH is continuing to come down.   CURRENT TREATMENT: Status post 5 cycles of Cytoxan/Taxotere/Herceptin/Perjeta  Zometa 4 mg IV Q3 weeks  REVIEW OF SYSTEMS: All other 10 point review of systems is negative except for those issues mentioned above.   PAST MEDICAL HISTORY: Past Medical History  Diagnosis Date  . Arthritis   . Depression   . Fainting   . Headache   . Hypertension   . Migraines   . History of blood transfusion   . Gastric ulcer   . Breast cancer metastasized to multiple sites 04/12/2014   PAST SURGICAL HISTORY: Past Surgical History  Procedure Laterality Date  . Abdominal hysterectomy  58 years go    partial   FAMILY HISTORY Family History  Problem Relation Age of Onset  . Arthritis Other   . Hypertension Other   . Hypertension Other   . Heart disease Other   . Stroke Other    GYNECOLOGIC HISTORY:  No LMP recorded. Patient  has had a hysterectomy.   SOCIAL HISTORY:  History   Social History  . Marital Status: Married    Spouse Name: N/A    Number of Children: N/A  . Years of Education: N/A   Occupational History  .      no work for 4 to 5 years   Social History Main Topics  . Smoking status: Never Smoker   . Smokeless tobacco: Never Used     Comment: never used tobacco  . Alcohol Use: No  . Drug Use: No  . Sexual Activity: Not Currently   Other Topics Concern  . Not on file   Social History Narrative  . No narrative on file   ADVANCED DIRECTIVES: <no information>  HEALTH MAINTENANCE: History  Substance Use Topics  . Smoking status: Never Smoker   . Smokeless tobacco: Never Used     Comment: never used tobacco  . Alcohol Use: No   Colonoscopy: PAP: Bone density: Lipid panel:  Allergies  Allergen Reactions  . Hydrocodone     "makes me crawl on the floor"  . Aspirin Other (See Comments)    Due to ulcers  . Penicillins Swelling    Current Outpatient Prescriptions  Medication Sig Dispense Refill  . amLODipine (NORVASC) 5 MG tablet Take 5 mg by mouth every morning.      Marland Kitchen dexamethasone (DECADRON) 4 MG tablet Take 2 tablets (8 mg total) by mouth 2 (two) times daily. Start the day before Taxotere.  Then again the day after chemo for 3 days.  30 tablet  1  . Diphenhydramine-APAP, sleep, (HEADACHE RELIEF PM) 38-500 MG TABS Take 2 tablets by mouth daily as needed (For headaches.).      Marland Kitchen escitalopram (LEXAPRO) 20 MG tablet Take 20 mg by mouth every morning.      . lidocaine-prilocaine (EMLA) cream Apply 1 application topically as needed. Apply quarter sized amount to portacath site at least one hour prior to treatment .  Cover with saran wrap  30 g  0  . lisinopril (PRINIVIL,ZESTRIL) 40 MG tablet Take 40 mg by mouth every morning.      Marland Kitchen LORazepam (ATIVAN) 0.5 MG tablet Take 1 tablet (0.5 mg total) by mouth every 6 (six) hours as needed (Nausea or vomiting).  30 tablet  0  . NON  FORMULARY Take by mouth every morning. OTC  Iron tab      . ondansetron (ZOFRAN) 8 MG tablet Take 1 tablet (8 mg total) by mouth 2 (two) times daily. Start the day after chemo for 3 days. Then take as needed for nausea or vomiting.  30 tablet  1  . oxyCODONE (OXY IR/ROXICODONE) 5 MG immediate release tablet Take 1 tablet (5 mg total) by mouth every 6 (six) hours as needed for severe pain.  30 tablet  0  . pantoprazole (PROTONIX) 40 MG tablet Take 1 tablet (40 mg total) by mouth daily.  30 tablet  6  . potassium chloride SA (K-DUR,KLOR-CON) 20 MEQ tablet Take 1 tablet (20 mEq total) by mouth 1 day or 1 dose.  24 tablet  0  . prochlorperazine (COMPAZINE) 10 MG tablet Take 1 tablet (10 mg total) by mouth every 6 (six) hours as needed (Nausea or vomiting).  30 tablet  1  . SUMAtriptan (IMITREX) 100 MG tablet Take 1 tablet (100 mg total) by mouth every 2 (two) hours as needed for migraine.  10 tablet  3  . doxycycline (VIBRA-TABS) 100 MG tablet Take 1 tablet (100 mg total) by mouth 2 (two) times daily.  28 tablet  0   No current facility-administered medications for this visit.   Facility-Administered Medications Ordered in Other Visits  Medication Dose Route Frequency Provider Last Rate Last Dose  . alteplase (CATHFLO ACTIVASE) injection 2 mg  2 mg Intracatheter Once PRN Volanda Napoleon, MD      . cyclophosphamide (CYTOXAN) 1,120 mg in sodium chloride 0.9 % 250 mL chemo infusion  600 mg/m2 (Treatment Plan Actual) Intravenous Once Volanda Napoleon, MD      . Dexamethasone Sodium Phosphate (DECADRON) injection 20 mg  20 mg Intravenous Once Volanda Napoleon, MD      . DOCEtaxel (TAXOTERE) 140 mg in dextrose 5 % 250 mL chemo infusion  75 mg/m2 (Treatment Plan Actual) Intravenous Once Volanda Napoleon, MD      . heparin lock flush 100 unit/mL  500 Units Intracatheter Once PRN Volanda Napoleon, MD      . heparin lock flush 100 unit/mL  250 Units Intracatheter Once PRN Volanda Napoleon, MD      . ondansetron  (ZOFRAN) IVPB 16 mg  16 mg Intravenous Once Volanda Napoleon, MD      . sodium chloride 0.9 % injection 10 mL  10 mL Intracatheter PRN Volanda Napoleon, MD      . sodium chloride 0.9 % injection 3 mL  3 mL Intravenous PRN Volanda Napoleon, MD       OBJECTIVE:  Filed Vitals:   08/06/14 0944  BP: 157/91  Pulse: 93  Temp: 98 F (36.7 C)  Resp: 14   Body mass index is 24.88 kg/(m^2). ECOG FS:0 - Asymptomatic Ocular: Sclerae unicteric, pupils equal, round and reactive to light Ear-nose-throat: Oropharynx clear, dentition fair Lymphatic: No cervical or supraclavicular adenopathy Lungs no rales or rhonchi, good excursion bilaterally Heart regular rate and rhythm, no murmur appreciated Abd soft, nontender, positive bowel sounds MSK no focal spinal tenderness, no joint edema Neuro: non-focal, well-oriented, appropriate affect Breasts: Her right breast would is draining a small amount yellow, serosanguinous fluid and there is a foul odor. The right breast below the areola is red and warm to the touch.  LAB RESULTS: CMP     Component Value Date/Time   NA 140 08/06/2014 0909   NA 137 04/09/2014 0450   K 3.9 08/06/2014 0909   K 3.5* 04/09/2014 0450   CL 106 08/06/2014 0909   CL 101 04/09/2014 0450   CO2 22 08/06/2014 0909   CO2 23 04/09/2014 0450   GLUCOSE 179* 08/06/2014 0909   GLUCOSE 98 04/09/2014 0450   BUN 10 08/06/2014 0909   BUN 5* 04/09/2014 0450   CREATININE 0.7 08/06/2014 0909   CREATININE 0.59 04/09/2014 0450   CALCIUM 8.5 08/06/2014 0909   CALCIUM 9.3 04/09/2014 0450   PROT 6.9 08/06/2014 0909   PROT 6.7 04/07/2014 0630   ALBUMIN 2.4* 04/07/2014 0630   AST 56* 08/06/2014 0909   AST 74* 04/07/2014 0630   ALT 19 08/06/2014 0909   ALT 34 04/07/2014 0630   ALKPHOS 120* 08/06/2014 0909   ALKPHOS 179* 04/07/2014 0630   BILITOT 0.70 08/06/2014 0909   BILITOT 0.7 04/07/2014 0630   GFRNONAA >90 04/09/2014 0450   GFRAA >90 04/09/2014 0450   I No results found for this basename: SPEP, UPEP,  kappa and  lambda light chains   Lab Results  Component Value Date   WBC 4.2 08/06/2014   NEUTROABS 3.9 08/06/2014   HGB 10.4* 08/06/2014   HCT 31.4* 08/06/2014   MCV 95 08/06/2014   PLT 294 08/06/2014   Lab Results  Component Value Date   LABCA2 579* 07/16/2014   No components found with this basename: [id number]   No results found for this basename: INR,  in the last 168 hours  STUDIES: None  ASSESSMENT/PLAN: Ms. Deveney is 58 year old Afro-American female with metastatic breast cancer. She is responding nicely.  We will proceed with Day 1, Cycle 6 of chemo today as planned.  We discussed her CBC and CMP which were negative.  I cultured her right breast wound and sent a prescription to the pharmacy for Doxycycline 100 mg PO BID for 14 days.  She has her treatment and appointment schedule.  All questions were answered and she is in agreement with the plan.  She knows to call here with any questions or concerns and to go to the ED in the event of an emergency. We can certainly see her again sooner if needed.   Eliezer Bottom, NP 08/06/2014 11:30 AM

## 2014-08-06 NOTE — Patient Instructions (Signed)
[facility] Discharge Instructions for Patients Receiving Chemotherapy  Today you received the following chemotherapy agents Taxotere, Cytoxan, Herceptin, and Perjeta.  To help prevent nausea and vomiting after your treatment, we encourage you to take your nausea medication as prescribed.    If you develop nausea and vomiting that is not controlled by your nausea medication, call the clinic.   BELOW ARE SYMPTOMS THAT SHOULD BE REPORTED IMMEDIATELY:  *FEVER GREATER THAN 100.5 F  *CHILLS WITH OR WITHOUT FEVER  NAUSEA AND VOMITING THAT IS NOT CONTROLLED WITH YOUR NAUSEA MEDICATION  *UNUSUAL SHORTNESS OF BREATH  *UNUSUAL BRUISING OR BLEEDING  TENDERNESS IN MOUTH AND THROAT WITH OR WITHOUT PRESENCE OF ULCERS  *URINARY PROBLEMS  *BOWEL PROBLEMS  UNUSUAL RASH Items with * indicate a potential emergency and should be followed up as soon as possible.  Feel free to call the clinic you have any questions or concerns. The clinic phone number is [phone].

## 2014-08-07 ENCOUNTER — Ambulatory Visit (HOSPITAL_BASED_OUTPATIENT_CLINIC_OR_DEPARTMENT_OTHER): Payer: Medicaid Other

## 2014-08-07 VITALS — BP 147/80 | HR 74 | Temp 97.6°F | Resp 12

## 2014-08-07 DIAGNOSIS — C50919 Malignant neoplasm of unspecified site of unspecified female breast: Secondary | ICD-10-CM

## 2014-08-07 DIAGNOSIS — C50911 Malignant neoplasm of unspecified site of right female breast: Secondary | ICD-10-CM

## 2014-08-07 DIAGNOSIS — Z5189 Encounter for other specified aftercare: Secondary | ICD-10-CM

## 2014-08-07 DIAGNOSIS — C787 Secondary malignant neoplasm of liver and intrahepatic bile duct: Secondary | ICD-10-CM

## 2014-08-07 LAB — IRON AND TIBC
%SAT: 11 % — ABNORMAL LOW (ref 20–55)
Iron: 31 ug/dL — ABNORMAL LOW (ref 42–145)
TIBC: 288 ug/dL (ref 250–470)
UIBC: 257 ug/dL (ref 125–400)

## 2014-08-07 LAB — FERRITIN: Ferritin: 785 ng/mL — ABNORMAL HIGH (ref 10–291)

## 2014-08-07 MED ORDER — PEGFILGRASTIM INJECTION 6 MG/0.6ML
SUBCUTANEOUS | Status: AC
  Filled 2014-08-07: qty 0.6

## 2014-08-07 MED ORDER — PEGFILGRASTIM INJECTION 6 MG/0.6ML
6.0000 mg | Freq: Once | SUBCUTANEOUS | Status: AC
  Administered 2014-08-07: 6 mg via SUBCUTANEOUS

## 2014-08-07 NOTE — Patient Instructions (Signed)
Pegfilgrastim injection What is this medicine? PEGFILGRASTIM (peg fil GRA stim) is a long-acting granulocyte colony-stimulating factor that stimulates the growth of neutrophils, a type of white blood cell important in the body's fight against infection. It is used to reduce the incidence of fever and infection in patients with certain types of cancer who are receiving chemotherapy that affects the bone marrow. This medicine may be used for other purposes; ask your health care provider or pharmacist if you have questions. COMMON BRAND NAME(S): Neulasta What should I tell my health care provider before I take this medicine? They need to know if you have any of these conditions: -latex allergy -ongoing radiation therapy -sickle cell disease -skin reactions to acrylic adhesives (On-Body Injector only) -an unusual or allergic reaction to pegfilgrastim, filgrastim, other medicines, foods, dyes, or preservatives -pregnant or trying to get pregnant -breast-feeding How should I use this medicine? This medicine is for injection under the skin. If you get this medicine at home, you will be taught how to prepare and give the pre-filled syringe or how to use the On-body Injector. Refer to the patient Instructions for Use for detailed instructions. Use exactly as directed. Take your medicine at regular intervals. Do not take your medicine more often than directed. It is important that you put your used needles and syringes in a special sharps container. Do not put them in a trash can. If you do not have a sharps container, call your pharmacist or healthcare provider to get one. Talk to your pediatrician regarding the use of this medicine in children. Special care may be needed. Overdosage: If you think you have taken too much of this medicine contact a poison control center or emergency room at once. NOTE: This medicine is only for you. Do not share this medicine with others. What if I miss a dose? It is  important not to miss your dose. Call your doctor or health care professional if you miss your dose. If you miss a dose due to an On-body Injector failure or leakage, a new dose should be administered as soon as possible using a single prefilled syringe for manual use. What may interact with this medicine? Interactions have not been studied. Give your health care provider a list of all the medicines, herbs, non-prescription drugs, or dietary supplements you use. Also tell them if you smoke, drink alcohol, or use illegal drugs. Some items may interact with your medicine. This list may not describe all possible interactions. Give your health care provider a list of all the medicines, herbs, non-prescription drugs, or dietary supplements you use. Also tell them if you smoke, drink alcohol, or use illegal drugs. Some items may interact with your medicine. What should I watch for while using this medicine? You may need blood work done while you are taking this medicine. If you are going to need a MRI, CT scan, or other procedure, tell your doctor that you are using this medicine (On-Body Injector only). What side effects may I notice from receiving this medicine? Side effects that you should report to your doctor or health care professional as soon as possible: -allergic reactions like skin rash, itching or hives, swelling of the face, lips, or tongue -dizziness -fever -pain, redness, or irritation at site where injected -pinpoint red spots on the skin -shortness of breath or breathing problems -stomach or side pain, or pain at the shoulder -swelling -tiredness -trouble passing urine Side effects that usually do not require medical attention (report to your doctor   or health care professional if they continue or are bothersome): -bone pain -muscle pain This list may not describe all possible side effects. Call your doctor for medical advice about side effects. You may report side effects to FDA at  1-800-FDA-1088. Where should I keep my medicine? Keep out of the reach of children. Store pre-filled syringes in a refrigerator between 2 and 8 degrees C (36 and 46 degrees F). Do not freeze. Keep in carton to protect from light. Throw away this medicine if it is left out of the refrigerator for more than 48 hours. Throw away any unused medicine after the expiration date. NOTE: This sheet is a summary. It may not cover all possible information. If you have questions about this medicine, talk to your doctor, pharmacist, or health care provider.  2015, [employer]vier/Gold Standard. (2014-03-15 16:14:05)  

## 2014-08-08 ENCOUNTER — Other Ambulatory Visit: Payer: Self-pay | Admitting: Family

## 2014-08-08 LAB — WOUND CULTURE

## 2014-08-24 ENCOUNTER — Other Ambulatory Visit: Payer: Self-pay | Admitting: Oncology

## 2014-08-24 DIAGNOSIS — C50919 Malignant neoplasm of unspecified site of unspecified female breast: Secondary | ICD-10-CM

## 2014-08-27 ENCOUNTER — Ambulatory Visit (HOSPITAL_BASED_OUTPATIENT_CLINIC_OR_DEPARTMENT_OTHER): Payer: Medicaid Other

## 2014-08-27 ENCOUNTER — Other Ambulatory Visit: Payer: Self-pay | Admitting: Family

## 2014-08-27 ENCOUNTER — Other Ambulatory Visit (HOSPITAL_BASED_OUTPATIENT_CLINIC_OR_DEPARTMENT_OTHER): Payer: Medicaid Other | Admitting: Lab

## 2014-08-27 ENCOUNTER — Encounter: Payer: Self-pay | Admitting: Family

## 2014-08-27 ENCOUNTER — Ambulatory Visit (HOSPITAL_BASED_OUTPATIENT_CLINIC_OR_DEPARTMENT_OTHER): Payer: BC Managed Care – PPO | Admitting: Family

## 2014-08-27 ENCOUNTER — Other Ambulatory Visit: Payer: Self-pay | Admitting: *Deleted

## 2014-08-27 VITALS — BP 160/97 | HR 101 | Temp 97.6°F | Resp 14 | Ht 64.0 in | Wt 153.0 lb

## 2014-08-27 DIAGNOSIS — C50911 Malignant neoplasm of unspecified site of right female breast: Secondary | ICD-10-CM

## 2014-08-27 DIAGNOSIS — C50419 Malignant neoplasm of upper-outer quadrant of unspecified female breast: Secondary | ICD-10-CM

## 2014-08-27 DIAGNOSIS — C50919 Malignant neoplasm of unspecified site of unspecified female breast: Secondary | ICD-10-CM

## 2014-08-27 DIAGNOSIS — C787 Secondary malignant neoplasm of liver and intrahepatic bile duct: Secondary | ICD-10-CM | POA: Diagnosis not present

## 2014-08-27 DIAGNOSIS — S21001D Unspecified open wound of right breast, subsequent encounter: Secondary | ICD-10-CM

## 2014-08-27 DIAGNOSIS — D649 Anemia, unspecified: Secondary | ICD-10-CM

## 2014-08-27 DIAGNOSIS — Z5111 Encounter for antineoplastic chemotherapy: Secondary | ICD-10-CM

## 2014-08-27 DIAGNOSIS — I10 Essential (primary) hypertension: Secondary | ICD-10-CM

## 2014-08-27 LAB — CBC WITH DIFFERENTIAL (CANCER CENTER ONLY)
BASO#: 0 10*3/uL (ref 0.0–0.2)
BASO%: 0.3 % (ref 0.0–2.0)
EOS ABS: 0 10*3/uL (ref 0.0–0.5)
EOS%: 0 % (ref 0.0–7.0)
HCT: 31.3 % — ABNORMAL LOW (ref 34.8–46.6)
HGB: 10.3 g/dL — ABNORMAL LOW (ref 11.6–15.9)
LYMPH#: 0.3 10*3/uL — AB (ref 0.9–3.3)
LYMPH%: 8.6 % — ABNORMAL LOW (ref 14.0–48.0)
MCH: 31.9 pg (ref 26.0–34.0)
MCHC: 32.9 g/dL (ref 32.0–36.0)
MCV: 97 fL (ref 81–101)
MONO#: 0 10*3/uL — AB (ref 0.1–0.9)
MONO%: 1 % (ref 0.0–13.0)
NEUT#: 3.5 10*3/uL (ref 1.5–6.5)
NEUT%: 90.1 % — ABNORMAL HIGH (ref 39.6–80.0)
PLATELETS: 290 10*3/uL (ref 145–400)
RBC: 3.23 10*6/uL — AB (ref 3.70–5.32)
RDW: 18.4 % — ABNORMAL HIGH (ref 11.1–15.7)
WBC: 3.9 10*3/uL (ref 3.9–10.0)

## 2014-08-27 LAB — CMP (CANCER CENTER ONLY)
ALT(SGPT): 33 U/L (ref 10–47)
AST: 84 U/L — AB (ref 11–38)
Albumin: 2.9 g/dL — ABNORMAL LOW (ref 3.3–5.5)
Alkaline Phosphatase: 164 U/L — ABNORMAL HIGH (ref 26–84)
BUN, Bld: 9 mg/dL (ref 7–22)
CALCIUM: 8.5 mg/dL (ref 8.0–10.3)
CHLORIDE: 105 meq/L (ref 98–108)
CO2: 23 mEq/L (ref 18–33)
CREATININE: 0.7 mg/dL (ref 0.6–1.2)
Glucose, Bld: 185 mg/dL — ABNORMAL HIGH (ref 73–118)
Potassium: 3.5 mEq/L (ref 3.3–4.7)
Sodium: 138 mEq/L (ref 128–145)
Total Bilirubin: 0.8 mg/dl (ref 0.20–1.60)
Total Protein: 6.1 g/dL — ABNORMAL LOW (ref 6.4–8.1)

## 2014-08-27 LAB — IRON AND TIBC CHCC
%SAT: 17 % — ABNORMAL LOW (ref 21–57)
Iron: 42 ug/dL (ref 41–142)
TIBC: 253 ug/dL (ref 236–444)
UIBC: 210 ug/dL (ref 120–384)

## 2014-08-27 LAB — LACTATE DEHYDROGENASE: LDH: 423 U/L — AB (ref 94–250)

## 2014-08-27 LAB — FERRITIN CHCC: FERRITIN: 785 ng/mL — AB (ref 9–269)

## 2014-08-27 MED ORDER — ONDANSETRON 16 MG/50ML IVPB (CHCC)
16.0000 mg | Freq: Once | INTRAVENOUS | Status: AC
  Administered 2014-08-27: 16 mg via INTRAVENOUS

## 2014-08-27 MED ORDER — HEPARIN SOD (PORK) LOCK FLUSH 100 UNIT/ML IV SOLN
500.0000 [IU] | Freq: Once | INTRAVENOUS | Status: AC | PRN
  Administered 2014-08-27: 500 [IU]
  Filled 2014-08-27: qty 5

## 2014-08-27 MED ORDER — DEXAMETHASONE SODIUM PHOSPHATE 20 MG/5ML IJ SOLN
20.0000 mg | Freq: Once | INTRAMUSCULAR | Status: AC
  Administered 2014-08-27: 20 mg via INTRAVENOUS

## 2014-08-27 MED ORDER — HEPARIN SOD (PORK) LOCK FLUSH 100 UNIT/ML IV SOLN
250.0000 [IU] | Freq: Once | INTRAVENOUS | Status: DC | PRN
  Filled 2014-08-27: qty 5

## 2014-08-27 MED ORDER — DEXAMETHASONE SODIUM PHOSPHATE 20 MG/5ML IJ SOLN
INTRAMUSCULAR | Status: AC
  Filled 2014-08-27: qty 5

## 2014-08-27 MED ORDER — SODIUM CHLORIDE 0.9 % IV SOLN
Freq: Once | INTRAVENOUS | Status: AC
  Administered 2014-08-27: 12:00:00 via INTRAVENOUS

## 2014-08-27 MED ORDER — DIPHENHYDRAMINE HCL 25 MG PO CAPS
ORAL_CAPSULE | ORAL | Status: AC
  Filled 2014-08-27: qty 2

## 2014-08-27 MED ORDER — ACETAMINOPHEN 325 MG PO TABS
650.0000 mg | ORAL_TABLET | Freq: Once | ORAL | Status: AC
  Administered 2014-08-27: 650 mg via ORAL

## 2014-08-27 MED ORDER — DOXYCYCLINE HYCLATE 100 MG PO TABS: 100.0000 mg | ORAL_TABLET | Freq: Two times a day (BID) | ORAL | Status: DC

## 2014-08-27 MED ORDER — OXYCODONE HCL 5 MG PO TABS: 5.0000 mg | ORAL_TABLET | Freq: Four times a day (QID) | ORAL | Status: DC | PRN

## 2014-08-27 MED ORDER — SODIUM CHLORIDE 0.9 % IJ SOLN
3.0000 mL | INTRAMUSCULAR | Status: DC | PRN
  Filled 2014-08-27: qty 10

## 2014-08-27 MED ORDER — SODIUM CHLORIDE 0.9 % IV SOLN
6.0000 mg/kg | Freq: Once | INTRAVENOUS | Status: AC
  Administered 2014-08-27: 399 mg via INTRAVENOUS
  Filled 2014-08-27: qty 19

## 2014-08-27 MED ORDER — CYCLOPHOSPHAMIDE CHEMO INJECTION 1 GM
600.0000 mg/m2 | Freq: Once | INTRAMUSCULAR | Status: AC
  Administered 2014-08-27: 1120 mg via INTRAVENOUS
  Filled 2014-08-27: qty 56

## 2014-08-27 MED ORDER — ACETAMINOPHEN 325 MG PO TABS
ORAL_TABLET | ORAL | Status: AC
  Filled 2014-08-27: qty 2

## 2014-08-27 MED ORDER — DIPHENHYDRAMINE HCL 25 MG PO CAPS
50.0000 mg | ORAL_CAPSULE | Freq: Once | ORAL | Status: AC
  Administered 2014-08-27: 50 mg via ORAL

## 2014-08-27 MED ORDER — SODIUM CHLORIDE 0.9 % IV SOLN
420.0000 mg | Freq: Once | INTRAVENOUS | Status: AC
  Administered 2014-08-27: 420 mg via INTRAVENOUS
  Filled 2014-08-27: qty 14

## 2014-08-27 MED ORDER — SODIUM CHLORIDE 0.9 % IJ SOLN
10.0000 mL | INTRAMUSCULAR | Status: DC | PRN
  Administered 2014-08-27: 10 mL
  Filled 2014-08-27: qty 10

## 2014-08-27 MED ORDER — ALTEPLASE 2 MG IJ SOLR
2.0000 mg | Freq: Once | INTRAMUSCULAR | Status: DC | PRN
  Filled 2014-08-27: qty 2

## 2014-08-27 MED ORDER — ONDANSETRON 16 MG/50ML IVPB (CHCC)
INTRAVENOUS | Status: AC
  Filled 2014-08-27: qty 16

## 2014-08-27 MED ORDER — DOCETAXEL CHEMO INJECTION 160 MG/16ML
75.0000 mg/m2 | Freq: Once | INTRAVENOUS | Status: AC
  Administered 2014-08-27: 140 mg via INTRAVENOUS
  Filled 2014-08-27: qty 14

## 2014-08-27 MED ORDER — ZOLEDRONIC ACID 4 MG/100ML IV SOLN
4.0000 mg | Freq: Once | INTRAVENOUS | Status: AC
  Administered 2014-08-27: 4 mg via INTRAVENOUS
  Filled 2014-08-27: qty 100

## 2014-08-27 NOTE — Progress Notes (Signed)
[place]  Telephone:(336) [phone] Fax:(336) [phone]  ID: Mary [name] OB: 09/06/1956 MR#: [id number] Patient Care Team: Biagio Borg, MD as PCP - General (Internal Medicine)  DIAGNOSIS: Metastatic breast cancer-ER positive/HER-2 positive  INTERVAL HISTORY: [name] is back today for follow-up and Day 1, Cycle 7 of chemo. She states that she is feeling good. The wound to her right breast looks a little better. The odor is much better. She has not started her Doxycycline because she couldn't get it from [facility]. We will have it filled downstairs today so she can get started. She still keeps a bandage over it at all times. She denies fever, chills, n/v, rash, cough, headaches, dizziness, SOB, chest pain, palpitations, abdominal pain, constipation, diarrhea, problems urinating, blood in urine or stool. She denies any swelling, tenderness or numbness. She still has some intermittent tingling in her fingertips that she describes as mild and tolerable. Her appetite is good, she has gained 2 lbs since her last visit. Her CA 27.29 in August was down to 317 and her LDH is continuing to come down at 391. Overall, she is doing quite well.   CURRENT TREATMENT: Status post 5 cycles of Cytoxan/Taxotere/Herceptin/Perjeta  Zometa 4 mg IV Q3 weeks  REVIEW OF SYSTEMS: All other 10 point review of systems is negative except for those issues mentioned above.   PAST MEDICAL HISTORY: Past Medical History  Diagnosis Date  . Arthritis   . Depression   . Fainting   . Headache   . Hypertension   . Migraines   . History of blood transfusion   . Gastric ulcer   . Breast cancer metastasized to multiple sites 04/12/2014   PAST SURGICAL HISTORY: Past Surgical History  Procedure Laterality Date  . Abdominal hysterectomy  15 years go    partial   FAMILY HISTORY Family History  Problem Relation Age of Onset  . Arthritis Other   . Hypertension Other   . Hypertension Other    . Heart disease Other   . Stroke Other    GYNECOLOGIC HISTORY:  No LMP recorded. Patient has had a hysterectomy.   SOCIAL HISTORY:  History   Social History  . Marital Status: Married    Spouse Name: N/A    Number of Children: N/A  . Years of Education: N/A   Occupational History  .      no work for 4 to 5 years   Social History Main Topics  . Smoking status: Never Smoker   . Smokeless tobacco: Never Used     Comment: never used tobacco  . Alcohol Use: No  . Drug Use: No  . Sexual Activity: Not Currently   Other Topics Concern  . Not on file   Social History Narrative  . No narrative on file   ADVANCED DIRECTIVES: <no information>  HEALTH MAINTENANCE: History  Substance Use Topics  . Smoking status: Never Smoker   . Smokeless tobacco: Never Used     Comment: never used tobacco  . Alcohol Use: No   Colonoscopy: PAP: Bone density: Lipid panel:  Allergies  Allergen Reactions  . Hydrocodone     "makes me crawl on the floor"  . Aspirin Other (See Comments)    Due to ulcers  . Penicillins Swelling    Current Outpatient Prescriptions  Medication Sig Dispense Refill  . amLODipine (NORVASC) 5 MG tablet Take 5 mg by mouth every morning.      Marland Kitchen dexamethasone (DECADRON) 4 MG tablet Take  2 tablets (8 mg total) by mouth 2 (two) times daily. Start the day before Taxotere. Then again the day after chemo for 3 days.  30 tablet  1  . Diphenhydramine-APAP, sleep, (HEADACHE RELIEF PM) 38-500 MG TABS Take 2 tablets by mouth daily as needed (For headaches.).      Marland Kitchen doxycycline (VIBRA-TABS) 100 MG tablet Take 1 tablet (100 mg total) by mouth 2 (two) times daily.  28 tablet  0  . escitalopram (LEXAPRO) 20 MG tablet Take 20 mg by mouth every morning.      . lidocaine-prilocaine (EMLA) cream Apply 1 application topically as needed. Apply quarter sized amount to portacath site at least one hour prior to treatment .  Cover with saran wrap  30 g  0  . lisinopril  (PRINIVIL,ZESTRIL) 40 MG tablet Take 40 mg by mouth every morning.      Marland Kitchen LORazepam (ATIVAN) 0.5 MG tablet Take 1 tablet (0.5 mg total) by mouth every 6 (six) hours as needed (Nausea or vomiting).  30 tablet  0  . NON FORMULARY Take by mouth every morning. OTC  Iron tab      . ondansetron (ZOFRAN) 8 MG tablet Take 1 tablet (8 mg total) by mouth 2 (two) times daily. Start the day after chemo for 3 days. Then take as needed for nausea or vomiting.  30 tablet  1  . oxyCODONE (OXY IR/ROXICODONE) 5 MG immediate release tablet Take 1 tablet (5 mg total) by mouth every 6 (six) hours as needed for severe pain.  30 tablet  0  . pantoprazole (PROTONIX) 40 MG tablet Take 1 tablet (40 mg total) by mouth daily.  30 tablet  6  . potassium chloride SA (K-DUR,KLOR-CON) 20 MEQ tablet Take 1 tablet (20 mEq total) by mouth 1 day or 1 dose.  24 tablet  0  . prochlorperazine (COMPAZINE) 10 MG tablet Take 1 tablet (10 mg total) by mouth every 6 (six) hours as needed (Nausea or vomiting).  30 tablet  1  . SUMAtriptan (IMITREX) 100 MG tablet Take 1 tablet (100 mg total) by mouth every 2 (two) hours as needed for migraine.  10 tablet  3   No current facility-administered medications for this visit.   Facility-Administered Medications Ordered in Other Visits  Medication Dose Route Frequency Provider Last Rate Last Dose  . alteplase (CATHFLO ACTIVASE) injection 2 mg  2 mg Intracatheter Once PRN Volanda Napoleon, MD      . cyclophosphamide (CYTOXAN) 1,120 mg in sodium chloride 0.9 % 250 mL chemo infusion  600 mg/m2 (Treatment Plan Actual) Intravenous Once Volanda Napoleon, MD      . DOCEtaxel (TAXOTERE) 140 mg in dextrose 5 % 250 mL chemo infusion  75 mg/m2 (Treatment Plan Actual) Intravenous Once Volanda Napoleon, MD      . heparin lock flush 100 unit/mL  500 Units Intracatheter Once PRN Volanda Napoleon, MD      . heparin lock flush 100 unit/mL  250 Units Intracatheter Once PRN Volanda Napoleon, MD      . pertuzumab (PERJETA)  420 mg in sodium chloride 0.9 % 250 mL chemo infusion  420 mg Intravenous Once Volanda Napoleon, MD      . sodium chloride 0.9 % injection 10 mL  10 mL Intracatheter PRN Volanda Napoleon, MD      . sodium chloride 0.9 % injection 3 mL  3 mL Intravenous PRN Volanda Napoleon, MD      .  trastuzumab (HERCEPTIN) 399 mg in sodium chloride 0.9 % 250 mL chemo infusion  6 mg/kg (Treatment Plan Actual) Intravenous Once Volanda Napoleon, MD 538 mL/hr at 08/27/14 1248 399 mg at 08/27/14 1248   OBJECTIVE: Filed Vitals:   08/27/14 1027  BP: 160/97  Pulse: 101  Temp: 97.6 F (36.4 C)  Resp: 14   Body mass index is 26.25 kg/(m^2). ECOG FS:0 - Asymptomatic Ocular: Sclerae unicteric, pupils equal, round and reactive to light Ear-nose-throat: Oropharynx clear, dentition fair Lymphatic: No cervical or supraclavicular adenopathy Lungs no rales or rhonchi, good excursion bilaterally Heart regular rate and rhythm, no murmur appreciated Abd soft, nontender, positive bowel sounds MSK no focal spinal tenderness, no joint edema Neuro: non-focal, well-oriented, appropriate affect Breasts: Wound still draining small amount of serous fluid. Minimal redness and edema around site. The odor is much better.    LAB RESULTS: CMP     Component Value Date/Time   NA 138 08/27/2014 0953   NA 137 04/09/2014 0450   K 3.5 08/27/2014 0953   K 3.5* 04/09/2014 0450   CL 105 08/27/2014 0953   CL 101 04/09/2014 0450   CO2 23 08/27/2014 0953   CO2 23 04/09/2014 0450   GLUCOSE 185* 08/27/2014 0953   GLUCOSE 98 04/09/2014 0450   BUN 9 08/27/2014 0953   BUN 5* 04/09/2014 0450   CREATININE 0.7 08/27/2014 0953   CREATININE 0.59 04/09/2014 0450   CALCIUM 8.5 08/27/2014 0953   CALCIUM 9.3 04/09/2014 0450   PROT 6.1* 08/27/2014 0953   PROT 6.7 04/07/2014 0630   ALBUMIN 2.4* 04/07/2014 0630   AST 84* 08/27/2014 0953   AST 74* 04/07/2014 0630   ALT 33 08/27/2014 0953   ALT 34 04/07/2014 0630   ALKPHOS 164* 08/27/2014 0953   ALKPHOS 179* 04/07/2014  0630   BILITOT 0.80 08/27/2014 0953   BILITOT 0.7 04/07/2014 0630   GFRNONAA >90 04/09/2014 0450   GFRAA >90 04/09/2014 0450   No results found for this basename: SPEP,  UPEP,   kappa and lambda light chains   Lab Results  Component Value Date   WBC 3.9 08/27/2014   NEUTROABS 3.5 08/27/2014   HGB 10.3* 08/27/2014   HCT 31.3* 08/27/2014   MCV 97 08/27/2014   PLT 290 08/27/2014   Lab Results  Component Value Date   LABCA2 317* 08/06/2014   No components found with this basename: [id number]   No results found for this basename: INR,  in the last 168 hours  STUDIES: None  ASSESSMENT/PLAN: Ms. Sandin is 58 year old Afro-American female with metastatic breast cancer. She is responding nicely.  We will proceed with Day 1, Cycle 7 of chemo today as planned.  Her CBC and CMP today looked ok. We will wait and see what the rest of her labs show.  We will repeat CT of her chest, abdomen and pelvis in 2 weeks.  She will start her Doxycline today.  We will see her back for follow-up and labs in 1 month. We will discuss her treatment plan at that time.  All questions were answered and she is in agreement with the plan.  She knows to call here with any questions or concerns and to go to the ED in the event of an emergency. We can certainly see her again sooner if needed.   Eliezer Bottom, NP 08/27/2014 1:04 PM

## 2014-08-27 NOTE — Patient Instructions (Signed)
[facility] Discharge Instructions for Patients Receiving Chemotherapy  Today you received the following chemotherapy agents Taxotere, Cytoxan, Herceptin, and Perjeta.  To help prevent nausea and vomiting after your treatment, we encourage you to take your nausea medication as prescribed.    If you develop nausea and vomiting that is not controlled by your nausea medication, call the clinic.   BELOW ARE SYMPTOMS THAT SHOULD BE REPORTED IMMEDIATELY:  *FEVER GREATER THAN 100.5 F  *CHILLS WITH OR WITHOUT FEVER  NAUSEA AND VOMITING THAT IS NOT CONTROLLED WITH YOUR NAUSEA MEDICATION  *UNUSUAL SHORTNESS OF BREATH  *UNUSUAL BRUISING OR BLEEDING  TENDERNESS IN MOUTH AND THROAT WITH OR WITHOUT PRESENCE OF ULCERS  *URINARY PROBLEMS  *BOWEL PROBLEMS  UNUSUAL RASH Items with * indicate a potential emergency and should be followed up as soon as possible.  Feel free to call the clinic you have any questions or concerns. The clinic phone number is [phone].

## 2014-08-28 ENCOUNTER — Ambulatory Visit (HOSPITAL_BASED_OUTPATIENT_CLINIC_OR_DEPARTMENT_OTHER): Payer: Medicaid Other

## 2014-08-28 VITALS — BP 140/87 | HR 97 | Temp 97.1°F | Resp 18

## 2014-08-28 DIAGNOSIS — C50419 Malignant neoplasm of upper-outer quadrant of unspecified female breast: Secondary | ICD-10-CM

## 2014-08-28 DIAGNOSIS — Z5189 Encounter for other specified aftercare: Secondary | ICD-10-CM | POA: Diagnosis not present

## 2014-08-28 DIAGNOSIS — Z23 Encounter for immunization: Secondary | ICD-10-CM | POA: Diagnosis not present

## 2014-08-28 DIAGNOSIS — C787 Secondary malignant neoplasm of liver and intrahepatic bile duct: Secondary | ICD-10-CM | POA: Diagnosis not present

## 2014-08-28 DIAGNOSIS — C50911 Malignant neoplasm of unspecified site of right female breast: Secondary | ICD-10-CM

## 2014-08-28 DIAGNOSIS — C50919 Malignant neoplasm of unspecified site of unspecified female breast: Secondary | ICD-10-CM

## 2014-08-28 MED ORDER — PEGFILGRASTIM INJECTION 6 MG/0.6ML
6.0000 mg | Freq: Once | SUBCUTANEOUS | Status: AC
  Administered 2014-08-28: 6 mg via SUBCUTANEOUS

## 2014-08-28 MED ORDER — PEGFILGRASTIM INJECTION 6 MG/0.6ML
SUBCUTANEOUS | Status: AC
  Filled 2014-08-28: qty 0.6

## 2014-08-28 MED ORDER — INFLUENZA VAC SPLIT QUAD 0.5 ML IM SUSY
0.5000 mL | PREFILLED_SYRINGE | Freq: Once | INTRAMUSCULAR | Status: AC
  Administered 2014-08-28: 0.5 mL via INTRAMUSCULAR
  Filled 2014-08-28: qty 0.5

## 2014-08-28 NOTE — Patient Instructions (Addendum)
Pegfilgrastim injection What is this medicine? PEGFILGRASTIM (peg fil GRA stim) is a long-acting granulocyte colony-stimulating factor that stimulates the growth of neutrophils, a type of white blood cell important in the body's fight against infection. It is used to reduce the incidence of fever and infection in patients with certain types of cancer who are receiving chemotherapy that affects the bone marrow. This medicine may be used for other purposes; ask your health care provider or pharmacist if you have questions. COMMON BRAND NAME(S): Neulasta What should I tell my health care provider before I take this medicine? They need to know if you have any of these conditions: -latex allergy -ongoing radiation therapy -sickle cell disease -skin reactions to acrylic adhesives (On-Body Injector only) -an unusual or allergic reaction to pegfilgrastim, filgrastim, other medicines, foods, dyes, or preservatives -pregnant or trying to get pregnant -breast-feeding How should I use this medicine? This medicine is for injection under the skin. If you get this medicine at home, you will be taught how to prepare and give the pre-filled syringe or how to use the On-body Injector. Refer to the patient Instructions for Use for detailed instructions. Use exactly as directed. Take your medicine at regular intervals. Do not take your medicine more often than directed. It is important that you put your used needles and syringes in a special sharps container. Do not put them in a trash can. If you do not have a sharps container, call your pharmacist or healthcare provider to get one. Talk to your pediatrician regarding the use of this medicine in children. Special care may be needed. Overdosage: If you think you have taken too much of this medicine contact a poison control center or emergency room at once. NOTE: This medicine is only for you. Do not share this medicine with others. What if I miss a dose? It is  important not to miss your dose. Call your doctor or health care professional if you miss your dose. If you miss a dose due to an On-body Injector failure or leakage, a new dose should be administered as soon as possible using a single prefilled syringe for manual use. What may interact with this medicine? Interactions have not been studied. Give your health care provider a list of all the medicines, herbs, non-prescription drugs, or dietary supplements you use. Also tell them if you smoke, drink alcohol, or use illegal drugs. Some items may interact with your medicine. This list may not describe all possible interactions. Give your health care provider a list of all the medicines, herbs, non-prescription drugs, or dietary supplements you use. Also tell them if you smoke, drink alcohol, or use illegal drugs. Some items may interact with your medicine. What should I watch for while using this medicine? You may need blood work done while you are taking this medicine. If you are going to need a MRI, CT scan, or other procedure, tell your doctor that you are using this medicine (On-Body Injector only). What side effects may I notice from receiving this medicine? Side effects that you should report to your doctor or health care professional as soon as possible: -allergic reactions like skin rash, itching or hives, swelling of the face, lips, or tongue -dizziness -fever -pain, redness, or irritation at site where injected -pinpoint red spots on the skin -shortness of breath or breathing problems -stomach or side pain, or pain at the shoulder -swelling -tiredness -trouble passing urine Side effects that usually do not require medical attention (report to your doctor   or health care professional if they continue or are bothersome): -bone pain -muscle pain This list may not describe all possible side effects. Call your doctor for medical advice about side effects. You may report side effects to FDA at  1-800-FDA-1088. Where should I keep my medicine? Keep out of the reach of children. Store pre-filled syringes in a refrigerator between 2 and 8 degrees C (36 and 46 degrees F). Do not freeze. Keep in carton to protect from light. Throw away this medicine if it is left out of the refrigerator for more than 48 hours. Throw away any unused medicine after the expiration date. NOTE: This sheet is a summary. It may not cover all possible information. If you have questions about this medicine, talk to your doctor, pharmacist, or health care provider.  2015, Elsevier/Gold Standard. (2014-03-15 16:14:05)  Influenza Virus Vaccine injection (Fluarix) What is this medicine? INFLUENZA VIRUS VACCINE (in floo EN zuh VAHY ruhs vak SEEN) helps to reduce the risk of getting influenza also known as the flu. This medicine may be used for other purposes; ask your health care provider or pharmacist if you have questions. COMMON BRAND NAME(S): Fluarix, Fluzone What should I tell my health care provider before I take this medicine? They need to know if you have any of these conditions: -bleeding disorder like hemophilia -fever or infection -Guillain-Barre syndrome or other neurological problems -immune system problems -infection with the human immunodeficiency virus (HIV) or AIDS -low blood platelet counts -multiple sclerosis -an unusual or allergic reaction to influenza virus vaccine, eggs, chicken proteins, latex, gentamicin, other medicines, foods, dyes or preservatives -pregnant or trying to get pregnant -breast-feeding How should I use this medicine? This vaccine is for injection into a muscle. It is given by a health care professional. A copy of Vaccine Information Statements will be given before each vaccination. Read this sheet carefully each time. The sheet may change frequently. Talk to your pediatrician regarding the use of this medicine in children. Special care may be needed. Overdosage: If you  think you have taken too much of this medicine contact a poison control center or emergency room at once. NOTE: This medicine is only for you. Do not share this medicine with others. What if I miss a dose? This does not apply. What may interact with this medicine? -chemotherapy or radiation therapy -medicines that lower your immune system like etanercept, anakinra, infliximab, and adalimumab -medicines that treat or prevent blood clots like warfarin -phenytoin -steroid medicines like prednisone or cortisone -theophylline -vaccines This list may not describe all possible interactions. Give your health care provider a list of all the medicines, herbs, non-prescription drugs, or dietary supplements you use. Also tell them if you smoke, drink alcohol, or use illegal drugs. Some items may interact with your medicine. What should I watch for while using this medicine? Report any side effects that do not go away within 3 days to your doctor or health care professional. Call your health care provider if any unusual symptoms occur within 6 weeks of receiving this vaccine. You may still catch the flu, but the illness is not usually as bad. You cannot get the flu from the vaccine. The vaccine will not protect against colds or other illnesses that may cause fever. The vaccine is needed every year. What side effects may I notice from receiving this medicine? Side effects that you should report to your doctor or health care professional as soon as possible: -allergic reactions like skin rash, itching or hives,  swelling of the face, lips, or tongue Side effects that usually do not require medical attention (report to your doctor or health care professional if they continue or are bothersome): -fever -headache -muscle aches and pains -pain, tenderness, redness, or swelling at site where injected -weak or tired This list may not describe all possible side effects. Call your doctor for medical advice about  side effects. You may report side effects to FDA at 1-800-FDA-1088. Where should I keep my medicine? This vaccine is only given in a clinic, pharmacy, doctor's office, or other health care setting and will not be stored at home. NOTE: This sheet is a summary. It may not cover all possible information. If you have questions about this medicine, talk to your doctor, pharmacist, or health care provider.  2015, Elsevier/Gold Standard. (2008-07-11 09:30:40)

## 2014-09-07 ENCOUNTER — Telehealth: Payer: Self-pay | Admitting: Hematology & Oncology

## 2014-09-07 NOTE — Telephone Encounter (Signed)
Scheduled 9-14 port start for CT but pt decided not to do it because she would have to be here at 9 am

## 2014-09-10 ENCOUNTER — Ambulatory Visit (HOSPITAL_BASED_OUTPATIENT_CLINIC_OR_DEPARTMENT_OTHER)
Admission: RE | Admit: 2014-09-10 | Discharge: 2014-09-10 | Disposition: A | Discharge status: Home / Self Care | Payer: Medicaid Other | Source: Ambulatory Visit | Attending: Family | Admitting: Family

## 2014-09-10 DIAGNOSIS — C7951 Secondary malignant neoplasm of bone: Secondary | ICD-10-CM | POA: Diagnosis not present

## 2014-09-10 DIAGNOSIS — C50919 Malignant neoplasm of unspecified site of unspecified female breast: Secondary | ICD-10-CM | POA: Diagnosis present

## 2014-09-10 DIAGNOSIS — C787 Secondary malignant neoplasm of liver and intrahepatic bile duct: Secondary | ICD-10-CM | POA: Diagnosis not present

## 2014-09-10 DIAGNOSIS — J90 Pleural effusion, not elsewhere classified: Secondary | ICD-10-CM | POA: Insufficient documentation

## 2014-09-10 DIAGNOSIS — C7952 Secondary malignant neoplasm of bone marrow: Secondary | ICD-10-CM

## 2014-09-10 MED ORDER — IOHEXOL 300 MG/ML  SOLN
100.0000 mL | Freq: Once | INTRAMUSCULAR | Status: AC | PRN
  Administered 2014-09-10: 100 mL via INTRAVENOUS

## 2014-09-17 ENCOUNTER — Other Ambulatory Visit: Payer: Self-pay | Admitting: *Deleted

## 2014-09-17 ENCOUNTER — Other Ambulatory Visit (HOSPITAL_BASED_OUTPATIENT_CLINIC_OR_DEPARTMENT_OTHER): Payer: Medicaid Other | Admitting: Lab

## 2014-09-17 ENCOUNTER — Ambulatory Visit (HOSPITAL_BASED_OUTPATIENT_CLINIC_OR_DEPARTMENT_OTHER): Payer: Medicaid Other | Admitting: Family

## 2014-09-17 ENCOUNTER — Encounter: Payer: Self-pay | Admitting: Family

## 2014-09-17 ENCOUNTER — Ambulatory Visit (HOSPITAL_BASED_OUTPATIENT_CLINIC_OR_DEPARTMENT_OTHER): Payer: Medicaid Other

## 2014-09-17 VITALS — BP 170/93 | HR 97 | Temp 98.0°F | Resp 16 | Ht 64.0 in | Wt 158.0 lb

## 2014-09-17 DIAGNOSIS — C50911 Malignant neoplasm of unspecified site of right female breast: Secondary | ICD-10-CM

## 2014-09-17 DIAGNOSIS — C7951 Secondary malignant neoplasm of bone: Secondary | ICD-10-CM

## 2014-09-17 DIAGNOSIS — Z5112 Encounter for antineoplastic immunotherapy: Secondary | ICD-10-CM

## 2014-09-17 DIAGNOSIS — C7952 Secondary malignant neoplasm of bone marrow: Secondary | ICD-10-CM

## 2014-09-17 DIAGNOSIS — C787 Secondary malignant neoplasm of liver and intrahepatic bile duct: Secondary | ICD-10-CM

## 2014-09-17 DIAGNOSIS — D509 Iron deficiency anemia, unspecified: Secondary | ICD-10-CM

## 2014-09-17 DIAGNOSIS — C50919 Malignant neoplasm of unspecified site of unspecified female breast: Secondary | ICD-10-CM

## 2014-09-17 DIAGNOSIS — Z5111 Encounter for antineoplastic chemotherapy: Secondary | ICD-10-CM

## 2014-09-17 DIAGNOSIS — D649 Anemia, unspecified: Secondary | ICD-10-CM

## 2014-09-17 LAB — IRON AND TIBC CHCC
%SAT: 16 % — AB (ref 21–57)
Iron: 44 ug/dL (ref 41–142)
TIBC: 276 ug/dL (ref 236–444)
UIBC: 232 ug/dL (ref 120–384)

## 2014-09-17 LAB — CBC WITH DIFFERENTIAL (CANCER CENTER ONLY)
BASO#: 0 10*3/uL (ref 0.0–0.2)
BASO%: 0.3 % (ref 0.0–2.0)
EOS%: 0 % (ref 0.0–7.0)
Eosinophils Absolute: 0 10*3/uL (ref 0.0–0.5)
HCT: 32 % — ABNORMAL LOW (ref 34.8–46.6)
HGB: 10.6 g/dL — ABNORMAL LOW (ref 11.6–15.9)
LYMPH#: 0.4 10*3/uL — AB (ref 0.9–3.3)
LYMPH%: 9.9 % — ABNORMAL LOW (ref 14.0–48.0)
MCH: 32.4 pg (ref 26.0–34.0)
MCHC: 33.1 g/dL (ref 32.0–36.0)
MCV: 98 fL (ref 81–101)
MONO#: 0 10*3/uL — ABNORMAL LOW (ref 0.1–0.9)
MONO%: 1.1 % (ref 0.0–13.0)
NEUT%: 88.7 % — ABNORMAL HIGH (ref 39.6–80.0)
NEUTROS ABS: 3.1 10*3/uL (ref 1.5–6.5)
Platelets: 281 10*3/uL (ref 145–400)
RBC: 3.27 10*6/uL — AB (ref 3.70–5.32)
RDW: 18.6 % — ABNORMAL HIGH (ref 11.1–15.7)
WBC: 3.5 10*3/uL — ABNORMAL LOW (ref 3.9–10.0)

## 2014-09-17 LAB — CMP (CANCER CENTER ONLY)
ALT(SGPT): 10 U/L (ref 10–47)
AST: 32 U/L (ref 11–38)
Albumin: 3.1 g/dL — ABNORMAL LOW (ref 3.3–5.5)
Alkaline Phosphatase: 116 U/L — ABNORMAL HIGH (ref 26–84)
BILIRUBIN TOTAL: 0.9 mg/dL (ref 0.20–1.60)
BUN, Bld: 8 mg/dL (ref 7–22)
CO2: 23 meq/L (ref 18–33)
CREATININE: 0.6 mg/dL (ref 0.6–1.2)
Calcium: 8.1 mg/dL (ref 8.0–10.3)
Chloride: 104 mEq/L (ref 98–108)
GLUCOSE: 168 mg/dL — AB (ref 73–118)
Potassium: 3.1 mEq/L — ABNORMAL LOW (ref 3.3–4.7)
Sodium: 133 mEq/L (ref 128–145)
Total Protein: 5.8 g/dL — ABNORMAL LOW (ref 6.4–8.1)

## 2014-09-17 LAB — LACTATE DEHYDROGENASE: LDH: 356 U/L — ABNORMAL HIGH (ref 94–250)

## 2014-09-17 LAB — FERRITIN CHCC: FERRITIN: 497 ng/mL — AB (ref 9–269)

## 2014-09-17 MED ORDER — PANTOPRAZOLE SODIUM 40 MG PO TBEC: 40.0000 mg | DELAYED_RELEASE_TABLET | Freq: Every day | ORAL | Status: DC

## 2014-09-17 MED ORDER — PERTUZUMAB CHEMO INJECTION 420 MG/14ML
420.0000 mg | Freq: Once | INTRAVENOUS | Status: AC
  Administered 2014-09-17: 420 mg via INTRAVENOUS
  Filled 2014-09-17: qty 14

## 2014-09-17 MED ORDER — HEPARIN SOD (PORK) LOCK FLUSH 100 UNIT/ML IV SOLN
250.0000 [IU] | Freq: Once | INTRAVENOUS | Status: DC | PRN
  Filled 2014-09-17: qty 5

## 2014-09-17 MED ORDER — DEXAMETHASONE SODIUM PHOSPHATE 20 MG/5ML IJ SOLN
20.0000 mg | Freq: Once | INTRAMUSCULAR | Status: AC
  Administered 2014-09-17: 20 mg via INTRAVENOUS

## 2014-09-17 MED ORDER — SODIUM CHLORIDE 0.9 % IJ SOLN
3.0000 mL | INTRAMUSCULAR | Status: DC | PRN
  Filled 2014-09-17: qty 10

## 2014-09-17 MED ORDER — ONDANSETRON 16 MG/50ML IVPB (CHCC)
16.0000 mg | Freq: Once | INTRAVENOUS | Status: AC
  Administered 2014-09-17: 16 mg via INTRAVENOUS

## 2014-09-17 MED ORDER — DOCETAXEL CHEMO INJECTION 160 MG/16ML
75.0000 mg/m2 | Freq: Once | INTRAVENOUS | Status: AC
  Administered 2014-09-17: 140 mg via INTRAVENOUS
  Filled 2014-09-17: qty 14

## 2014-09-17 MED ORDER — DIPHENHYDRAMINE HCL 25 MG PO CAPS
50.0000 mg | ORAL_CAPSULE | Freq: Once | ORAL | Status: AC
Start: 2014-09-17 — End: 2014-09-17
  Administered 2014-09-17: 50 mg via ORAL

## 2014-09-17 MED ORDER — SODIUM CHLORIDE 0.9 % IJ SOLN
10.0000 mL | INTRAMUSCULAR | Status: DC | PRN
  Filled 2014-09-17: qty 10

## 2014-09-17 MED ORDER — ACETAMINOPHEN 325 MG PO TABS
ORAL_TABLET | ORAL | Status: AC
Start: 2014-09-17 — End: 2014-09-17
  Filled 2014-09-17: qty 2

## 2014-09-17 MED ORDER — DIPHENHYDRAMINE HCL 25 MG PO CAPS
ORAL_CAPSULE | ORAL | Status: AC
  Filled 2014-09-17: qty 2

## 2014-09-17 MED ORDER — SODIUM CHLORIDE 0.9 % IJ SOLN
10.0000 mL | INTRAMUSCULAR | Status: DC | PRN
  Administered 2014-09-17: 10 mL
  Filled 2014-09-17: qty 10

## 2014-09-17 MED ORDER — ALTEPLASE 2 MG IJ SOLR
2.0000 mg | Freq: Once | INTRAMUSCULAR | Status: DC | PRN
  Filled 2014-09-17: qty 2

## 2014-09-17 MED ORDER — CYCLOPHOSPHAMIDE CHEMO INJECTION 1 GM
600.0000 mg/m2 | Freq: Once | INTRAMUSCULAR | Status: AC
  Administered 2014-09-17: 1120 mg via INTRAVENOUS
  Filled 2014-09-17: qty 56

## 2014-09-17 MED ORDER — HEPARIN SOD (PORK) LOCK FLUSH 100 UNIT/ML IV SOLN
500.0000 [IU] | Freq: Once | INTRAVENOUS | Status: AC | PRN
  Administered 2014-09-17: 500 [IU]
  Filled 2014-09-17: qty 5

## 2014-09-17 MED ORDER — ONDANSETRON 16 MG/50ML IVPB (CHCC)
INTRAVENOUS | Status: AC
  Filled 2014-09-17: qty 16

## 2014-09-17 MED ORDER — HEPARIN SOD (PORK) LOCK FLUSH 100 UNIT/ML IV SOLN
500.0000 [IU] | Freq: Once | INTRAVENOUS | Status: DC | PRN
  Filled 2014-09-17: qty 5

## 2014-09-17 MED ORDER — TRASTUZUMAB CHEMO INJECTION 440 MG
6.0000 mg/kg | Freq: Once | INTRAVENOUS | Status: AC
  Administered 2014-09-17: 399 mg via INTRAVENOUS
  Filled 2014-09-17: qty 19

## 2014-09-17 MED ORDER — SODIUM CHLORIDE 0.9 % IV SOLN: Freq: Once | INTRAVENOUS | Status: DC

## 2014-09-17 MED ORDER — ACETAMINOPHEN 325 MG PO TABS
650.0000 mg | ORAL_TABLET | Freq: Once | ORAL | Status: AC
  Administered 2014-09-17: 650 mg via ORAL

## 2014-09-17 MED ORDER — SODIUM CHLORIDE 0.9 % IV SOLN
Freq: Once | INTRAVENOUS | Status: AC
  Administered 2014-09-17: 12:00:00 via INTRAVENOUS

## 2014-09-17 NOTE — Progress Notes (Signed)
[facility]  [phone] [phone]  ID: PRISILLA KOCSIS OB: [birth year] MR#: [id number] VQM#:[id number] Patient Care Team: Biagio Borg, MD as PCP - General (Internal Medicine)  DIAGNOSIS: Metastatic breast cancer-ER positive/HER-2 positive  INTERVAL HISTORY: Ms. Meenach is back today for follow-up and Day 1, Cycle 8 of chemo. She states that she is feeling good. The wound to her right breast looks much better. It is draining minimal cream colored drainage. The odor is better. She still keeps a bandage over it at all times. She denies fever, chills, n/v, rash, cough, headaches, dizziness, SOB, chest pain, palpitations, abdominal pain, constipation, diarrhea, problems urinating, blood in urine or stool. She denies any swelling, tenderness or numbness. She still has some intermittent tingling in her fingertips that she describes as mild and tolerable. Her appetite is good and she is drinking plenty of fluids. Her weight is stable. Her CA 27.29 in August was down to 317. Her LDH today is 423. Overall, she seems to be doing well.   CURRENT TREATMENT: Status post 5 cycles of Cytoxan/Taxotere/Herceptin/Perjeta  Zometa 4 mg IV Q3 weeks  REVIEW OF SYSTEMS: All other 10 point review of systems is negative except for those issues mentioned above.   PAST MEDICAL HISTORY: Past Medical History  Diagnosis Date  . Arthritis   . Depression   . Fainting   . Headache   . Hypertension   . Migraines   . History of blood transfusion   . Gastric ulcer   . Breast cancer metastasized to multiple sites 04/12/2014   PAST SURGICAL HISTORY: Past Surgical History  Procedure Laterality Date  . Abdominal hysterectomy  15 years go    partial   FAMILY HISTORY Family History  Problem Relation Age of Onset  . Arthritis Other   . Hypertension Other   . Hypertension Other   . Heart disease Other   . Stroke Other    GYNECOLOGIC HISTORY:  No LMP recorded. Patient has had a  hysterectomy.   SOCIAL HISTORY:  History   Social History  . Marital Status: Married    Spouse Name: N/A    Number of Children: N/A  . Years of Education: N/A   Occupational History  .      no work for 4 to 5 years   Social History Main Topics  . Smoking status: Never Smoker   . Smokeless tobacco: Never Used     Comment: never used tobacco  . Alcohol Use: No  . Drug Use: No  . Sexual Activity: Not Currently   Other Topics Concern  . Not on file   Social History Narrative  . No narrative on file   ADVANCED DIRECTIVES: <no information>  HEALTH MAINTENANCE: History  Substance Use Topics  . Smoking status: Never Smoker   . Smokeless tobacco: Never Used     Comment: never used tobacco  . Alcohol Use: No   Colonoscopy: PAP: Bone density: Lipid panel:  Allergies  Allergen Reactions  . Hydrocodone     "makes me crawl on the floor"  . Aspirin Other (See Comments)    Due to ulcers  . Penicillins Swelling    Current Outpatient Prescriptions  Medication Sig Dispense Refill  . amLODipine (NORVASC) 5 MG tablet Take 5 mg by mouth every morning.      Marland Kitchen dexamethasone (DECADRON) 4 MG tablet Take 2 tablets (8 mg total) by mouth 2 (two) times daily. Start the day before Taxotere. Then again the day after  chemo for 3 days.  30 tablet  1  . Diphenhydramine-APAP, sleep, (HEADACHE RELIEF PM) 38-500 MG TABS Take 2 tablets by mouth daily as needed (For headaches.).      Marland Kitchen doxycycline (VIBRA-TABS) 100 MG tablet Take 1 tablet (100 mg total) by mouth 2 (two) times daily.  28 tablet  0  . escitalopram (LEXAPRO) 20 MG tablet Take 20 mg by mouth every morning.      . lidocaine-prilocaine (EMLA) cream Apply 1 application topically as needed. Apply quarter sized amount to portacath site at least one hour prior to treatment .  Cover with saran wrap  30 g  0  . lisinopril (PRINIVIL,ZESTRIL) 40 MG tablet Take 40 mg by mouth every morning.      Marland Kitchen LORazepam (ATIVAN) 0.5 MG tablet Take 1 tablet  (0.5 mg total) by mouth every 6 (six) hours as needed (Nausea or vomiting).  30 tablet  0  . NON FORMULARY Take by mouth every morning. OTC  Iron tab      . ondansetron (ZOFRAN) 8 MG tablet Take 1 tablet (8 mg total) by mouth 2 (two) times daily. Start the day after chemo for 3 days. Then take as needed for nausea or vomiting.  30 tablet  1  . oxyCODONE (OXY IR/ROXICODONE) 5 MG immediate release tablet Take 1 tablet (5 mg total) by mouth every 6 (six) hours as needed for severe pain.  30 tablet  0  . pantoprazole (PROTONIX) 40 MG tablet Take 1 tablet (40 mg total) by mouth daily.  30 tablet  6  . potassium chloride SA (K-DUR,KLOR-CON) 20 MEQ tablet Take 1 tablet (20 mEq total) by mouth 1 day or 1 dose.  24 tablet  0  . prochlorperazine (COMPAZINE) 10 MG tablet Take 1 tablet (10 mg total) by mouth every 6 (six) hours as needed (Nausea or vomiting).  30 tablet  1  . SUMAtriptan (IMITREX) 100 MG tablet Take 1 tablet (100 mg total) by mouth every 2 (two) hours as needed for migraine.  10 tablet  3   No current facility-administered medications for this visit.   Facility-Administered Medications Ordered in Other Visits  Medication Dose Route Frequency Provider Last Rate Last Dose  . 0.9 %  sodium chloride infusion   Intravenous Once Volanda Napoleon, MD      . alteplase (CATHFLO ACTIVASE) injection 2 mg  2 mg Intracatheter Once PRN Volanda Napoleon, MD      . alteplase (CATHFLO ACTIVASE) injection 2 mg  2 mg Intracatheter Once PRN Volanda Napoleon, MD      . cyclophosphamide (CYTOXAN) 1,120 mg in sodium chloride 0.9 % 250 mL chemo infusion  600 mg/m2 (Treatment Plan Actual) Intravenous Once Volanda Napoleon, MD      . DOCEtaxel (TAXOTERE) 140 mg in sodium chloride 0.9 % 250 mL chemo infusion  75 mg/m2 (Treatment Plan Actual) Intravenous Once Volanda Napoleon, MD 264 mL/hr at 09/17/14 1411 140 mg at 09/17/14 1411  . heparin lock flush 100 unit/mL  500 Units Intracatheter Once PRN Volanda Napoleon, MD      .  heparin lock flush 100 unit/mL  250 Units Intracatheter Once PRN Volanda Napoleon, MD      . heparin lock flush 100 unit/mL  500 Units Intracatheter Once PRN Volanda Napoleon, MD      . heparin lock flush 100 unit/mL  250 Units Intracatheter Once PRN Volanda Napoleon, MD      . sodium  chloride 0.9 % injection 10 mL  10 mL Intracatheter PRN Volanda Napoleon, MD      . sodium chloride 0.9 % injection 10 mL  10 mL Intracatheter PRN Volanda Napoleon, MD      . sodium chloride 0.9 % injection 3 mL  3 mL Intravenous PRN Volanda Napoleon, MD      . sodium chloride 0.9 % injection 3 mL  3 mL Intravenous PRN Volanda Napoleon, MD       OBJECTIVE: Filed Vitals:   09/17/14 1026  BP: 170/93  Pulse: 97  Temp: 98 F (36.7 C)  Resp: 16   Body mass index is 27.11 kg/(m^2). ECOG FS:0 - Asymptomatic Ocular: Sclerae unicteric, pupils equal, round and reactive to light Ear-nose-throat: Oropharynx clear, dentition fair Lymphatic: No cervical or supraclavicular adenopathy Lungs no rales or rhonchi, good excursion bilaterally Heart regular rate and rhythm, no murmur appreciated Abd soft, nontender, positive bowel sounds MSK no focal spinal tenderness, no joint edema Neuro: non-focal, well-oriented, appropriate affect Breasts: Wound still draining small amount of serous fluid. Minimal redness and edema around site. The odor is much better.    LAB RESULTS: CMP     Component Value Date/Time   NA 133 09/17/2014 1248   NA 137 04/09/2014 0450   K 3.1* 09/17/2014 1248   K 3.5* 04/09/2014 0450   CL 104 09/17/2014 1248   CL 101 04/09/2014 0450   CO2 23 09/17/2014 1248   CO2 23 04/09/2014 0450   GLUCOSE 168* 09/17/2014 1248   GLUCOSE 98 04/09/2014 0450   BUN 8 09/17/2014 1248   BUN 5* 04/09/2014 0450   CREATININE 0.6 09/17/2014 1248   CREATININE 0.59 04/09/2014 0450   CALCIUM 8.1 09/17/2014 1248   CALCIUM 9.3 04/09/2014 0450   PROT 5.8* 09/17/2014 1248   PROT 6.7 04/07/2014 0630   ALBUMIN 2.4* 04/07/2014 0630   AST 32  09/17/2014 1248   AST 74* 04/07/2014 0630   ALT 10 09/17/2014 1248   ALT 34 04/07/2014 0630   ALKPHOS 116* 09/17/2014 1248   ALKPHOS 179* 04/07/2014 0630   BILITOT 0.90 09/17/2014 1248   BILITOT 0.7 04/07/2014 0630   GFRNONAA >90 04/09/2014 0450   GFRAA >90 04/09/2014 0450   No results found for this basename: SPEP,  UPEP,   kappa and lambda light chains   Lab Results  Component Value Date   WBC 3.5* 09/17/2014   NEUTROABS 3.1 09/17/2014   HGB 10.6* 09/17/2014   HCT 32.0* 09/17/2014   MCV 98 09/17/2014   PLT 281 09/17/2014   Lab Results  Component Value Date   LABCA2 317* 08/06/2014   No components found with this basename: [id number]   No results found for this basename: INR,  in the last 168 hours  STUDIES: None  ASSESSMENT/PLAN: Ms. Lagrange is 58 year old Afro-American female with metastatic breast cancer. She is responding nicely.  We will proceed with Day 1, Cycle 8 of chemo today as planned.  We discussed her CBC and CMP. We will wait and see what the rest of her labs show.  We will see her back for follow-up and labs in 1 month. She will get a new treatment schedule today. All questions were answered and she is in agreement with the plan.  She knows to call here with any questions or concerns and to go to the ED in the event of an emergency. We can certainly see her again sooner if needed.   Eliezer Bottom, NP  09/17/2014 2:24 PM

## 2014-09-17 NOTE — Patient Instructions (Signed)
Docetaxel injection What is this medicine? DOCETAXEL (doe se TAX el) is a chemotherapy drug. It targets fast dividing cells, like cancer cells, and causes these cells to die. This medicine is used to treat many types of cancers like breast cancer, certain stomach cancers, head and neck cancer, lung cancer, and prostate cancer. This medicine may be used for other purposes; ask your health care provider or pharmacist if you have questions. COMMON BRAND NAME(S): Docefrez, Taxotere What should I tell my health care provider before I take this medicine? They need to know if you have any of these conditions: -infection (especially a virus infection such as chickenpox, cold sores, or herpes) -liver disease -low blood counts, like low white cell, platelet, or red cell counts -an unusual or allergic reaction to docetaxel, polysorbate 80, other chemotherapy agents, other medicines, foods, dyes, or preservatives -pregnant or trying to get pregnant -breast-feeding How should I use this medicine? This drug is given as an infusion into a vein. It is administered in a hospital or clinic by a specially trained health care professional. Talk to your pediatrician regarding the use of this medicine in children. Special care may be needed. Overdosage: If you think you have taken too much of this medicine contact a poison control center or emergency room at once. NOTE: This medicine is only for you. Do not share this medicine with others. What if I miss a dose? It is important not to miss your dose. Call your doctor or health care professional if you are unable to keep an appointment. What may interact with this medicine? -cyclosporine -erythromycin -ketoconazole -medicines to increase blood counts like filgrastim, pegfilgrastim, sargramostim -vaccines Talk to your doctor or health care professional before taking any of these medicines: -acetaminophen -aspirin -ibuprofen -ketoprofen -naproxen This list  may not describe all possible interactions. Give your health care provider a list of all the medicines, herbs, non-prescription drugs, or dietary supplements you use. Also tell them if you smoke, drink alcohol, or use illegal drugs. Some items may interact with your medicine. What should I watch for while using this medicine? Your condition will be monitored carefully while you are receiving this medicine. You will need important blood work done while you are taking this medicine. This drug may make you feel generally unwell. This is not uncommon, as chemotherapy can affect healthy cells as well as cancer cells. Report any side effects. Continue your course of treatment even though you feel ill unless your doctor tells you to stop. In some cases, you may be given additional medicines to help with side effects. Follow all directions for their use. Call your doctor or health care professional for advice if you get a fever, chills or sore throat, or other symptoms of a cold or flu. Do not treat yourself. This drug decreases your body's ability to fight infections. Try to avoid being around people who are sick. This medicine may increase your risk to bruise or bleed. Call your doctor or health care professional if you notice any unusual bleeding. Be careful brushing and flossing your teeth or using a toothpick because you may get an infection or bleed more easily. If you have any dental work done, tell your dentist you are receiving this medicine. Avoid taking products that contain aspirin, acetaminophen, ibuprofen, naproxen, or ketoprofen unless instructed by your doctor. These medicines may hide a fever. This medicine contains an alcohol in the product. You may get drowsy or dizzy. Do not drive, use machinery, or do anything  that needs mental alertness until you know how this medicine affects you. Do not stand or sit up quickly, especially if you are an older patient. This reduces the risk of dizzy or  fainting spells. Avoid alcoholic drinks Do not become pregnant while taking this medicine. Women should inform their doctor if they wish to become pregnant or think they might be pregnant. There is a potential for serious side effects to an unborn child. Talk to your health care professional or pharmacist for more information. Do not breast-feed an infant while taking this medicine. What side effects may I notice from receiving this medicine? Side effects that you should report to your doctor or health care professional as soon as possible: -allergic reactions like skin rash, itching or hives, swelling of the face, lips, or tongue -low blood counts - This drug may decrease the number of white blood cells, red blood cells and platelets. You may be at increased risk for infections and bleeding. -signs of infection - fever or chills, cough, sore throat, pain or difficulty passing urine -signs of decreased platelets or bleeding - bruising, pinpoint red spots on the skin, black, tarry stools, nosebleeds -signs of decreased red blood cells - unusually weak or tired, fainting spells, lightheadedness -breathing problems -fast or irregular heartbeat -low blood pressure -mouth sores -nausea and vomiting -pain, swelling, redness or irritation at the injection site -pain, tingling, numbness in the hands or feet -swelling of the ankle, feet, hands -weight gain Side effects that usually do not require medical attention (report to your prescriber or health care professional if they continue or are bothersome): -bone pain -complete hair loss including hair on your head, underarms, pubic hair, eyebrows, and eyelashes -diarrhea -excessive tearing -changes in the color of fingernails -loosening of the fingernails -nausea -muscle pain -red flush to skin -sweating -weak or tired This list may not describe all possible side effects. Call your doctor for medical advice about side effects. You may report side  effects to FDA at 1-800-FDA-1088. Where should I keep my medicine? This drug is given in a hospital or clinic and will not be stored at home. NOTE: This sheet is a summary. It may not cover all possible information. If you have questions about this medicine, talk to your doctor, pharmacist, or health care provider.  2015, [employer]. (2013-11-09 22:21:02) Cyclophosphamide injection What is this medicine? CYCLOPHOSPHAMIDE (sye kloe FOSS fa mide) is a chemotherapy drug. It slows the growth of cancer cells. This medicine is used to treat many types of cancer like lymphoma, myeloma, leukemia, breast cancer, and ovarian cancer, to name a few. This medicine may be used for other purposes; ask your health care provider or pharmacist if you have questions. COMMON BRAND NAME(S): Cytoxan, Neosar What should I tell my health care provider before I take this medicine? They need to know if you have any of these conditions: -blood disorders -history of other chemotherapy -infection -kidney disease -liver disease -recent or ongoing radiation therapy -tumors in the bone marrow -an unusual or allergic reaction to cyclophosphamide, other chemotherapy, other medicines, foods, dyes, or preservatives -pregnant or trying to get pregnant -breast-feeding How should I use this medicine? This drug is usually given as an injection into a vein or muscle or by infusion into a vein. It is administered in a hospital or clinic by a specially trained health care professional. Talk to your pediatrician regarding the use of this medicine in children. Special care may be needed. Overdosage: If you think you  have taken too much of this medicine contact a poison control center or emergency room at once. NOTE: This medicine is only for you. Do not share this medicine with others. What if I miss a dose? It is important not to miss your dose. Call your doctor or health care professional if you are unable to keep an  appointment. What may interact with this medicine? This medicine may interact with the following medications: -amiodarone -amphotericin B -azathioprine -certain antiviral medicines for HIV or AIDS such as protease inhibitors (e.g., indinavir, ritonavir) and zidovudine -certain blood pressure medications such as benazepril, captopril, enalapril, fosinopril, lisinopril, moexipril, monopril, perindopril, quinapril, ramipril, trandolapril -certain cancer medications such as anthracyclines (e.g., daunorubicin, doxorubicin), busulfan, cytarabine, paclitaxel, pentostatin, tamoxifen, trastuzumab -certain diuretics such as chlorothiazide, chlorthalidone, hydrochlorothiazide, indapamide, metolazone -certain medicines that treat or prevent blood clots like warfarin -certain muscle relaxants such as succinylcholine -cyclosporine -etanercept -indomethacin -medicines to increase blood counts like filgrastim, pegfilgrastim, sargramostim -medicines used as general anesthesia -metronidazole -natalizumab This list may not describe all possible interactions. Give your health care provider a list of all the medicines, herbs, non-prescription drugs, or dietary supplements you use. Also tell them if you smoke, drink alcohol, or use illegal drugs. Some items may interact with your medicine. What should I watch for while using this medicine? Visit your doctor for checks on your progress. This drug may make you feel generally unwell. This is not uncommon, as chemotherapy can affect healthy cells as well as cancer cells. Report any side effects. Continue your course of treatment even though you feel ill unless your doctor tells you to stop. Drink water or other fluids as directed. Urinate often, even at night. In some cases, you may be given additional medicines to help with side effects. Follow all directions for their use. Call your doctor or health care professional for advice if you get a fever, chills or sore  throat, or other symptoms of a cold or flu. Do not treat yourself. This drug decreases your body's ability to fight infections. Try to avoid being around people who are sick. This medicine may increase your risk to bruise or bleed. Call your doctor or health care professional if you notice any unusual bleeding. Be careful brushing and flossing your teeth or using a toothpick because you may get an infection or bleed more easily. If you have any dental work done, tell your dentist you are receiving this medicine. You may get drowsy or dizzy. Do not drive, use machinery, or do anything that needs mental alertness until you know how this medicine affects you. Do not become pregnant while taking this medicine or for 1 year after stopping it. Women should inform their doctor if they wish to become pregnant or think they might be pregnant. Men should not father a child while taking this medicine and for 4 months after stopping it. There is a potential for serious side effects to an unborn child. Talk to your health care professional or pharmacist for more information. Do not breast-feed an infant while taking this medicine. This medicine may interfere with the ability to have a child. This medicine has caused ovarian failure in some women. This medicine has caused reduced sperm counts in some men. You should talk with your doctor or health care professional if you are concerned about your fertility. If you are going to have surgery, tell your doctor or health care professional that you have taken this medicine. What side effects may I notice from receiving  this medicine? Side effects that you should report to your doctor or health care professional as soon as possible: -allergic reactions like skin rash, itching or hives, swelling of the face, lips, or tongue -low blood counts - this medicine may decrease the number of white blood cells, red blood cells and platelets. You may be at increased risk for infections  and bleeding. -signs of infection - fever or chills, cough, sore throat, pain or difficulty passing urine -signs of decreased platelets or bleeding - bruising, pinpoint red spots on the skin, black, tarry stools, blood in the urine -signs of decreased red blood cells - unusually weak or tired, fainting spells, lightheadedness -breathing problems -dark urine -dizziness -palpitations -swelling of the ankles, feet, hands -trouble passing urine or change in the amount of urine -weight gain -yellowing of the eyes or skin Side effects that usually do not require medical attention (report to your doctor or health care professional if they continue or are bothersome): -changes in nail or skin color -hair loss -missed menstrual periods -mouth sores -nausea, vomiting This list may not describe all possible side effects. Call your doctor for medical advice about side effects. You may report side effects to FDA at 1-800-FDA-1088. Where should I keep my medicine? This drug is given in a hospital or clinic and will not be stored at home. NOTE: This sheet is a summary. It may not cover all possible information. If you have questions about this medicine, talk to your doctor, pharmacist, or health care provider.  2015, [employer]. (2012-10-28 16:22:58) Pertuzumab injection What is this medicine? PERTUZUMAB (per TOOZ ue mab) is a monoclonal antibody that targets a protein called HER2. HER2 is found in some breast cancers. This medicine can stop cancer cell growth. This medicine is used with other cancer treatments. This medicine may be used for other purposes; ask your health care provider or pharmacist if you have questions. COMMON BRAND NAME(S): PERJETA What should I tell my health care provider before I take this medicine? They need to know if you have any of these conditions: -heart disease -heart failure -high blood pressure -history of irregular heart beat -recent or ongoing  radiation therapy -an unusual or allergic reaction to pertuzumab, other medicines, foods, dyes, or preservatives -pregnant or trying to get pregnant -breast-feeding How should I use this medicine? This medicine is for infusion into a vein. It is given by a health care professional in a hospital or clinic setting. Talk to your pediatrician regarding the use of this medicine in children. Special care may be needed. Overdosage: If you think you've taken too much of this medicine contact a poison control center or emergency room at once. Overdosage: If you think you have taken too much of this medicine contact a poison control center or emergency room at once. NOTE: This medicine is only for you. Do not share this medicine with others. What if I miss a dose? It is important not to miss your dose. Call your doctor or health care professional if you are unable to keep an appointment. What may interact with this medicine? Interactions are not expected. Give your health care provider a list of all the medicines, herbs, non-prescription drugs, or dietary supplements you use. Also tell them if you smoke, drink alcohol, or use illegal drugs. Some items may interact with your medicine. This list may not describe all possible interactions. Give your health care provider a list of all the medicines, herbs, non-prescription drugs, or dietary supplements you use.  Also tell them if you smoke, drink alcohol, or use illegal drugs. Some items may interact with your medicine. What should I watch for while using this medicine? Your condition will be monitored carefully while you are receiving this medicine. Report any side effects. Continue your course of treatment even though you feel ill unless your doctor tells you to stop. Do not become pregnant while taking this medicine. Women should inform their doctor if they wish to become pregnant or think they might be pregnant. There is a potential for serious side effects  to an unborn child. Talk to your health care professional or pharmacist for more information. Do not breast-feed an infant while taking this medicine. Call your doctor or health care professional for advice if you get a fever, chills or sore throat, or other symptoms of a cold or flu. Do not treat yourself. Try to avoid being around people who are sick. You may experience fever, chills, and headache during the infusion. Report any side effects during the infusion to your health care professional. What side effects may I notice from receiving this medicine? Side effects that you should report to your doctor or health care professional as soon as possible: -breathing problems -chest pain or palpitations -dizziness -feeling faint or lightheaded -fever or chills -skin rash, itching or hives -sore throat -swelling of the face, lips, or tongue -swelling of the legs or ankles -unusually weak or tired Side effects that usually do not require medical attention (Report these to your doctor or health care professional if they continue or are bothersome.): -diarrhea -hair loss -nausea, vomiting -tiredness This list may not describe all possible side effects. Call your doctor for medical advice about side effects. You may report side effects to FDA at 1-800-FDA-1088. Where should I keep my medicine? This drug is given in a hospital or clinic and will not be stored at home. NOTE: This sheet is a summary. It may not cover all possible information. If you have questions about this medicine, talk to your doctor, pharmacist, or health care provider.  2015, [employer] Standard. (2012-10-12 16:54:15) Trastuzumab injection for infusion What is this medicine? TRASTUZUMAB (tras TOO zoo mab) is a monoclonal antibody. It targets a protein called HER2. This protein is found in some stomach and breast cancers. This medicine can stop cancer cell growth. This medicine may be used with other cancer  treatments. This medicine may be used for other purposes; ask your health care provider or pharmacist if you have questions. COMMON BRAND NAME(S): Herceptin What should I tell my health care provider before I take this medicine? They need to know if you have any of these conditions: -heart disease -heart failure -infection (especially a virus infection such as chickenpox, cold sores, or herpes) -lung or breathing disease, like asthma -recent or ongoing radiation therapy -an unusual or allergic reaction to trastuzumab, benzyl alcohol, or other medications, foods, dyes, or preservatives -pregnant or trying to get pregnant -breast-feeding How should I use this medicine? This drug is given as an infusion into a vein. It is administered in a hospital or clinic by a specially trained health care professional. Talk to your pediatrician regarding the use of this medicine in children. This medicine is not approved for use in children. Overdosage: If you think you have taken too much of this medicine contact a poison control center or emergency room at once. NOTE: This medicine is only for you. Do not share this medicine with others. What if I miss a dose?  It is important not to miss a dose. Call your doctor or health care professional if you are unable to keep an appointment. What may interact with this medicine? -cyclophosphamide -doxorubicin -warfarin This list may not describe all possible interactions. Give your health care provider a list of all the medicines, herbs, non-prescription drugs, or dietary supplements you use. Also tell them if you smoke, drink alcohol, or use illegal drugs. Some items may interact with your medicine. What should I watch for while using this medicine? Visit your doctor for checks on your progress. Report any side effects. Continue your course of treatment even though you feel ill unless your doctor tells you to stop. Call your doctor or health care professional for  advice if you get a fever, chills or sore throat, or other symptoms of a cold or flu. Do not treat yourself. Try to avoid being around people who are sick. You may experience fever, chills and shaking during your first infusion. These effects are usually mild and can be treated with other medicines. Report any side effects during the infusion to your health care professional. Fever and chills usually do not happen with later infusions. What side effects may I notice from receiving this medicine? Side effects that you should report to your doctor or other health care professional as soon as possible: -breathing difficulties -chest pain or palpitations -cough -dizziness or fainting -fever or chills, sore throat -skin rash, itching or hives -swelling of the legs or ankles -unusually weak or tired Side effects that usually do not require medical attention (report to your doctor or other health care professional if they continue or are bothersome): -loss of appetite -headache -muscle aches -nausea This list may not describe all possible side effects. Call your doctor for medical advice about side effects. You may report side effects to FDA at 1-800-FDA-1088. Where should I keep my medicine? This drug is given in a hospital or clinic and will not be stored at home. NOTE: This sheet is a summary. It may not cover all possible information. If you have questions about this medicine, talk to your doctor, pharmacist, or health care provider.  2015, [employer] Standard. (2009-10-18 13:43:15)

## 2014-09-18 ENCOUNTER — Ambulatory Visit (HOSPITAL_BASED_OUTPATIENT_CLINIC_OR_DEPARTMENT_OTHER): Payer: Medicaid Other

## 2014-09-18 VITALS — BP 149/81 | HR 81 | Temp 98.2°F | Resp 16

## 2014-09-18 DIAGNOSIS — C50919 Malignant neoplasm of unspecified site of unspecified female breast: Secondary | ICD-10-CM

## 2014-09-18 DIAGNOSIS — Z5189 Encounter for other specified aftercare: Secondary | ICD-10-CM

## 2014-09-18 DIAGNOSIS — C787 Secondary malignant neoplasm of liver and intrahepatic bile duct: Secondary | ICD-10-CM

## 2014-09-18 DIAGNOSIS — C50911 Malignant neoplasm of unspecified site of right female breast: Secondary | ICD-10-CM

## 2014-09-18 DIAGNOSIS — C7951 Secondary malignant neoplasm of bone: Secondary | ICD-10-CM

## 2014-09-18 DIAGNOSIS — C7952 Secondary malignant neoplasm of bone marrow: Secondary | ICD-10-CM

## 2014-09-18 MED ORDER — PEGFILGRASTIM INJECTION 6 MG/0.6ML
SUBCUTANEOUS | Status: AC
  Filled 2014-09-18: qty 0.6

## 2014-09-18 MED ORDER — PEGFILGRASTIM INJECTION 6 MG/0.6ML
6.0000 mg | Freq: Once | SUBCUTANEOUS | Status: AC
  Administered 2014-09-18: 6 mg via SUBCUTANEOUS

## 2014-09-18 NOTE — Patient Instructions (Signed)
Pegfilgrastim injection What is this medicine? PEGFILGRASTIM (peg fil GRA stim) is a long-acting granulocyte colony-stimulating factor that stimulates the growth of neutrophils, a type of white blood cell important in the body's fight against infection. It is used to reduce the incidence of fever and infection in patients with certain types of cancer who are receiving chemotherapy that affects the bone marrow. This medicine may be used for other purposes; ask your health care provider or pharmacist if you have questions. COMMON BRAND NAME(S): Neulasta What should I tell my health care provider before I take this medicine? They need to know if you have any of these conditions: -latex allergy -ongoing radiation therapy -sickle cell disease -skin reactions to acrylic adhesives (On-Body Injector only) -an unusual or allergic reaction to pegfilgrastim, filgrastim, other medicines, foods, dyes, or preservatives -pregnant or trying to get pregnant -breast-feeding How should I use this medicine? This medicine is for injection under the skin. If you get this medicine at home, you will be taught how to prepare and give the pre-filled syringe or how to use the On-body Injector. Refer to the patient Instructions for Use for detailed instructions. Use exactly as directed. Take your medicine at regular intervals. Do not take your medicine more often than directed. It is important that you put your used needles and syringes in a special sharps container. Do not put them in a trash can. If you do not have a sharps container, call your pharmacist or healthcare provider to get one. Talk to your pediatrician regarding the use of this medicine in children. Special care may be needed. Overdosage: If you think you have taken too much of this medicine contact a poison control center or emergency room at once. NOTE: This medicine is only for you. Do not share this medicine with others. What if I miss a dose? It is  important not to miss your dose. Call your doctor or health care professional if you miss your dose. If you miss a dose due to an On-body Injector failure or leakage, a new dose should be administered as soon as possible using a single prefilled syringe for manual use. What may interact with this medicine? Interactions have not been studied. Give your health care provider a list of all the medicines, herbs, non-prescription drugs, or dietary supplements you use. Also tell them if you smoke, drink alcohol, or use illegal drugs. Some items may interact with your medicine. This list may not describe all possible interactions. Give your health care provider a list of all the medicines, herbs, non-prescription drugs, or dietary supplements you use. Also tell them if you smoke, drink alcohol, or use illegal drugs. Some items may interact with your medicine. What should I watch for while using this medicine? You may need blood work done while you are taking this medicine. If you are going to need a MRI, CT scan, or other procedure, tell your doctor that you are using this medicine (On-Body Injector only). What side effects may I notice from receiving this medicine? Side effects that you should report to your doctor or health care professional as soon as possible: -allergic reactions like skin rash, itching or hives, swelling of the face, lips, or tongue -dizziness -fever -pain, redness, or irritation at site where injected -pinpoint red spots on the skin -shortness of breath or breathing problems -stomach or side pain, or pain at the shoulder -swelling -tiredness -trouble passing urine Side effects that usually do not require medical attention (report to your doctor   or health care professional if they continue or are bothersome): -bone pain -muscle pain This list may not describe all possible side effects. Call your doctor for medical advice about side effects. You may report side effects to FDA at  1-800-FDA-1088. Where should I keep my medicine? Keep out of the reach of children. Store pre-filled syringes in a refrigerator between 2 and 8 degrees C (36 and 46 degrees F). Do not freeze. Keep in carton to protect from light. Throw away this medicine if it is left out of the refrigerator for more than 48 hours. Throw away any unused medicine after the expiration date. NOTE: This sheet is a summary. It may not cover all possible information. If you have questions about this medicine, talk to your doctor, pharmacist, or health care provider.  2015, [employer]vier/Gold Standard. (2014-03-15 16:14:05)  

## 2014-10-08 ENCOUNTER — Encounter: Payer: Self-pay | Admitting: Family

## 2014-10-08 ENCOUNTER — Other Ambulatory Visit (HOSPITAL_BASED_OUTPATIENT_CLINIC_OR_DEPARTMENT_OTHER): Payer: Medicaid Other | Admitting: Lab

## 2014-10-08 ENCOUNTER — Ambulatory Visit (HOSPITAL_BASED_OUTPATIENT_CLINIC_OR_DEPARTMENT_OTHER): Payer: Medicaid Other | Admitting: Family

## 2014-10-08 ENCOUNTER — Ambulatory Visit (HOSPITAL_BASED_OUTPATIENT_CLINIC_OR_DEPARTMENT_OTHER): Payer: Medicaid Other

## 2014-10-08 VITALS — BP 171/101 | HR 102 | Temp 98.6°F | Resp 14 | Ht 64.0 in | Wt 160.0 lb

## 2014-10-08 DIAGNOSIS — C7951 Secondary malignant neoplasm of bone: Secondary | ICD-10-CM

## 2014-10-08 DIAGNOSIS — Z5111 Encounter for antineoplastic chemotherapy: Secondary | ICD-10-CM

## 2014-10-08 DIAGNOSIS — S21001D Unspecified open wound of right breast, subsequent encounter: Secondary | ICD-10-CM

## 2014-10-08 DIAGNOSIS — C787 Secondary malignant neoplasm of liver and intrahepatic bile duct: Secondary | ICD-10-CM

## 2014-10-08 DIAGNOSIS — C50919 Malignant neoplasm of unspecified site of unspecified female breast: Secondary | ICD-10-CM

## 2014-10-08 DIAGNOSIS — C50911 Malignant neoplasm of unspecified site of right female breast: Secondary | ICD-10-CM

## 2014-10-08 DIAGNOSIS — D649 Anemia, unspecified: Secondary | ICD-10-CM

## 2014-10-08 DIAGNOSIS — Z5112 Encounter for antineoplastic immunotherapy: Secondary | ICD-10-CM

## 2014-10-08 LAB — CBC WITH DIFFERENTIAL (CANCER CENTER ONLY)
BASO#: 0 10*3/uL (ref 0.0–0.2)
BASO%: 0 % (ref 0.0–2.0)
EOS ABS: 0 10*3/uL (ref 0.0–0.5)
EOS%: 0.2 % (ref 0.0–7.0)
HEMATOCRIT: 33.2 % — AB (ref 34.8–46.6)
HGB: 10.9 g/dL — ABNORMAL LOW (ref 11.6–15.9)
LYMPH#: 0.3 10*3/uL — ABNORMAL LOW (ref 0.9–3.3)
LYMPH%: 5.1 % — AB (ref 14.0–48.0)
MCH: 32.4 pg (ref 26.0–34.0)
MCHC: 32.8 g/dL (ref 32.0–36.0)
MCV: 99 fL (ref 81–101)
MONO#: 0.1 10*3/uL (ref 0.1–0.9)
MONO%: 1.3 % (ref 0.0–13.0)
NEUT#: 4.9 10*3/uL (ref 1.5–6.5)
NEUT%: 93.4 % — AB (ref 39.6–80.0)
Platelets: 265 10*3/uL (ref 145–400)
RBC: 3.36 10*6/uL — AB (ref 3.70–5.32)
RDW: 17.6 % — ABNORMAL HIGH (ref 11.1–15.7)
WBC: 5.3 10*3/uL (ref 3.9–10.0)

## 2014-10-08 LAB — CMP (CANCER CENTER ONLY)
ALT: 20 U/L (ref 10–47)
AST: 51 U/L — ABNORMAL HIGH (ref 11–38)
Albumin: 3.1 g/dL — ABNORMAL LOW (ref 3.3–5.5)
Alkaline Phosphatase: 121 U/L — ABNORMAL HIGH (ref 26–84)
BUN, Bld: 11 mg/dL (ref 7–22)
CALCIUM: 8.7 mg/dL (ref 8.0–10.3)
CHLORIDE: 105 meq/L (ref 98–108)
CO2: 24 meq/L (ref 18–33)
CREATININE: 0.6 mg/dL (ref 0.6–1.2)
GLUCOSE: 144 mg/dL — AB (ref 73–118)
Potassium: 3.2 mEq/L — ABNORMAL LOW (ref 3.3–4.7)
Sodium: 137 mEq/L (ref 128–145)
Total Bilirubin: 0.7 mg/dl (ref 0.20–1.60)
Total Protein: 5.8 g/dL — ABNORMAL LOW (ref 6.4–8.1)

## 2014-10-08 LAB — CANCER ANTIGEN 27.29: CA 27.29: 178 U/mL — AB (ref 0–39)

## 2014-10-08 LAB — IRON AND TIBC CHCC
%SAT: 11 % — ABNORMAL LOW (ref 21–57)
IRON: 30 ug/dL — AB (ref 41–142)
TIBC: 263 ug/dL (ref 236–444)
UIBC: 233 ug/dL (ref 120–384)

## 2014-10-08 LAB — LACTATE DEHYDROGENASE: LDH: 322 U/L — ABNORMAL HIGH (ref 94–250)

## 2014-10-08 LAB — FERRITIN CHCC: Ferritin: 447 ng/ml — ABNORMAL HIGH (ref 9–269)

## 2014-10-08 MED ORDER — ACETAMINOPHEN 325 MG PO TABS
ORAL_TABLET | ORAL | Status: AC
Start: 2014-10-08 — End: 2014-10-08
  Filled 2014-10-08: qty 2

## 2014-10-08 MED ORDER — SODIUM CHLORIDE 0.9 % IV SOLN
140.0000 mg | Freq: Once | INTRAVENOUS | Status: AC
  Administered 2014-10-08: 140 mg via INTRAVENOUS
  Filled 2014-10-08: qty 14

## 2014-10-08 MED ORDER — AMLODIPINE BESYLATE 5 MG PO TABS: 5.0000 mg | ORAL_TABLET | Freq: Every morning | ORAL | Status: DC

## 2014-10-08 MED ORDER — ZOLEDRONIC ACID 4 MG/100ML IV SOLN
4.0000 mg | Freq: Once | INTRAVENOUS | Status: AC
  Administered 2014-10-08: 4 mg via INTRAVENOUS
  Filled 2014-10-08: qty 100

## 2014-10-08 MED ORDER — SODIUM CHLORIDE 0.9 % IV SOLN
Freq: Once | INTRAVENOUS | Status: AC
  Administered 2014-10-08: 10:00:00 via INTRAVENOUS

## 2014-10-08 MED ORDER — ESCITALOPRAM OXALATE 20 MG PO TABS: 20.0000 mg | ORAL_TABLET | Freq: Every morning | ORAL | Status: DC

## 2014-10-08 MED ORDER — SODIUM CHLORIDE 0.9 % IV SOLN
600.0000 mg/m2 | Freq: Once | INTRAVENOUS | Status: AC
  Administered 2014-10-08: 1120 mg via INTRAVENOUS
  Filled 2014-10-08: qty 56

## 2014-10-08 MED ORDER — LISINOPRIL 40 MG PO TABS: 40.0000 mg | ORAL_TABLET | Freq: Every morning | ORAL | Status: DC

## 2014-10-08 MED ORDER — DEXAMETHASONE SODIUM PHOSPHATE 20 MG/5ML IJ SOLN
INTRAMUSCULAR | Status: AC
  Filled 2014-10-08: qty 5

## 2014-10-08 MED ORDER — OXYCODONE HCL 5 MG PO TABS: 5.0000 mg | ORAL_TABLET | Freq: Four times a day (QID) | ORAL | Status: DC | PRN

## 2014-10-08 MED ORDER — SODIUM CHLORIDE 0.9 % IJ SOLN
10.0000 mL | INTRAMUSCULAR | Status: DC | PRN
  Filled 2014-10-08: qty 10

## 2014-10-08 MED ORDER — SODIUM CHLORIDE 0.9 % IJ SOLN
10.0000 mL | INTRAMUSCULAR | Status: DC | PRN
  Administered 2014-10-08: 10 mL
  Filled 2014-10-08: qty 10

## 2014-10-08 MED ORDER — SODIUM CHLORIDE 0.9 % IV SOLN: Freq: Once | INTRAVENOUS | Status: DC

## 2014-10-08 MED ORDER — TRASTUZUMAB CHEMO INJECTION 440 MG
6.0000 mg/kg | Freq: Once | INTRAVENOUS | Status: AC
  Administered 2014-10-08: 399 mg via INTRAVENOUS
  Filled 2014-10-08: qty 19

## 2014-10-08 MED ORDER — DOCETAXEL CHEMO INJECTION 160 MG/16ML
75.0000 mg/m2 | Freq: Once | INTRAVENOUS | Status: DC
  Filled 2014-10-08: qty 14

## 2014-10-08 MED ORDER — SODIUM CHLORIDE 0.9 % IV SOLN
420.0000 mg | Freq: Once | INTRAVENOUS | Status: AC
  Administered 2014-10-08: 420 mg via INTRAVENOUS
  Filled 2014-10-08: qty 14

## 2014-10-08 MED ORDER — HEPARIN SOD (PORK) LOCK FLUSH 100 UNIT/ML IV SOLN
500.0000 [IU] | Freq: Once | INTRAVENOUS | Status: AC | PRN
  Administered 2014-10-08: 500 [IU]
  Filled 2014-10-08: qty 5

## 2014-10-08 MED ORDER — DIPHENHYDRAMINE HCL 25 MG PO CAPS
ORAL_CAPSULE | ORAL | Status: AC
  Filled 2014-10-08: qty 2

## 2014-10-08 MED ORDER — ONDANSETRON 16 MG/50ML IVPB (CHCC)
16.0000 mg | Freq: Once | INTRAVENOUS | Status: AC
  Administered 2014-10-08: 16 mg via INTRAVENOUS

## 2014-10-08 MED ORDER — DIPHENHYDRAMINE HCL 25 MG PO CAPS
50.0000 mg | ORAL_CAPSULE | Freq: Once | ORAL | Status: AC
  Administered 2014-10-08: 50 mg via ORAL

## 2014-10-08 MED ORDER — ACETAMINOPHEN 325 MG PO TABS
650.0000 mg | ORAL_TABLET | Freq: Once | ORAL | Status: AC
  Administered 2014-10-08: 650 mg via ORAL

## 2014-10-08 MED ORDER — DEXAMETHASONE SODIUM PHOSPHATE 20 MG/5ML IJ SOLN
20.0000 mg | Freq: Once | INTRAMUSCULAR | Status: AC
  Administered 2014-10-08: 20 mg via INTRAVENOUS

## 2014-10-08 MED ORDER — ONDANSETRON 16 MG/50ML IVPB (CHCC)
INTRAVENOUS | Status: AC
  Filled 2014-10-08: qty 16

## 2014-10-08 NOTE — Progress Notes (Signed)
[place]  [phone] [phone]  ID: LATERICA MATARAZZO OB: 1956-02-18 MR#: [id number] SWN#:[id number] Patient Care Team: Biagio Borg, MD as PCP - General (Internal Medicine)  DIAGNOSIS: Metastatic breast cancer-ER positive/HER-2 positive  INTERVAL HISTORY: Ms. Niemeier is back today for follow-up and Day 1, Cycle 9 of chemo. She states that she is feeling good. The wound to her right breast looks much better. The opening is much smaller, the drainage is lessening and the odor is much better. She is keeping it clean and dry with a bandage over it. She denies fever, chills, n/v, rash, cough, headaches, dizziness, SOB, chest pain, palpitations, abdominal pain, constipation, diarrhea, problems urinating, blood in urine or stool. She denies any swelling, tenderness or numbness. She still has some intermittent tingling in her fingertips that she describes as mild and tolerable. Her appetite is good and she is drinking plenty of fluids. Her weight is stable. Her CA 27.29 in August was down to 317. Her LDH in September was 356. Overall, she seems to be doing well.   CURRENT TREATMENT: Status post 5 cycles of Cytoxan/Taxotere/Herceptin/Perjeta  Zometa 4 mg IV Q3 weeks  REVIEW OF SYSTEMS: All other 10 point review of systems is negative except for those issues mentioned above.   PAST MEDICAL HISTORY: Past Medical History  Diagnosis Date  . Arthritis   . Depression   . Fainting   . Headache   . Hypertension   . Migraines   . History of blood transfusion   . Gastric ulcer   . Breast cancer metastasized to multiple sites 04/12/2014   PAST SURGICAL HISTORY: Past Surgical History  Procedure Laterality Date  . Abdominal hysterectomy  15 years go    partial   FAMILY HISTORY Family History  Problem Relation Age of Onset  . Arthritis Other   . Hypertension Other   . Hypertension Other   . Heart disease Other   . Stroke Other    GYNECOLOGIC HISTORY:  No LMP  recorded. Patient has had a hysterectomy.   SOCIAL HISTORY:  History   Social History  . Marital Status: Married    Spouse Name: N/A    Number of Children: N/A  . Years of Education: N/A   Occupational History  .      no work for 4 to 5 years   Social History Main Topics  . Smoking status: Never Smoker   . Smokeless tobacco: Never Used     Comment: never used tobacco  . Alcohol Use: No  . Drug Use: No  . Sexual Activity: Not Currently   Other Topics Concern  . Not on file   Social History Narrative  . No narrative on file   ADVANCED DIRECTIVES: <no information>  HEALTH MAINTENANCE: History  Substance Use Topics  . Smoking status: Never Smoker   . Smokeless tobacco: Never Used     Comment: never used tobacco  . Alcohol Use: No   Colonoscopy: PAP: Bone density: Lipid panel:  Allergies  Allergen Reactions  . Hydrocodone     "makes me crawl on the floor"  . Aspirin Other (See Comments)    Due to ulcers  . Penicillins Swelling    Current Outpatient Prescriptions  Medication Sig Dispense Refill  . amLODipine (NORVASC) 5 MG tablet Take 1 tablet (5 mg total) by mouth every morning.  30 tablet  4  . dexamethasone (DECADRON) 4 MG tablet Take 2 tablets (8 mg total) by mouth 2 (two) times  daily. Start the day before Taxotere. Then again the day after chemo for 3 days.  30 tablet  1  . Diphenhydramine-APAP, sleep, (HEADACHE RELIEF PM) 38-500 MG TABS Take 2 tablets by mouth daily as needed (For headaches.).      Marland Kitchen escitalopram (LEXAPRO) 20 MG tablet Take 1 tablet (20 mg total) by mouth every morning.  30 tablet  4  . lidocaine-prilocaine (EMLA) cream Apply 1 application topically as needed. Apply quarter sized amount to portacath site at least one hour prior to treatment .  Cover with saran wrap  30 g  0  . lisinopril (PRINIVIL,ZESTRIL) 40 MG tablet Take 1 tablet (40 mg total) by mouth every morning.  30 tablet  4  . LORazepam (ATIVAN) 0.5 MG tablet Take 1 tablet (0.5  mg total) by mouth every 6 (six) hours as needed (Nausea or vomiting).  30 tablet  0  . ondansetron (ZOFRAN) 8 MG tablet Take 1 tablet (8 mg total) by mouth 2 (two) times daily. Start the day after chemo for 3 days. Then take as needed for nausea or vomiting.  30 tablet  1  . oxyCODONE (OXY IR/ROXICODONE) 5 MG immediate release tablet Take 1 tablet (5 mg total) by mouth every 6 (six) hours as needed for severe pain.  30 tablet  0  . pantoprazole (PROTONIX) 40 MG tablet Take 1 tablet (40 mg total) by mouth daily.  30 tablet  2  . prochlorperazine (COMPAZINE) 10 MG tablet Take 1 tablet (10 mg total) by mouth every 6 (six) hours as needed (Nausea or vomiting).  30 tablet  1  . SUMAtriptan (IMITREX) 100 MG tablet Take 1 tablet (100 mg total) by mouth every 2 (two) hours as needed for migraine.  10 tablet  3  . potassium chloride SA (K-DUR,KLOR-CON) 20 MEQ tablet Take 1 tablet (20 mEq total) by mouth 1 day or 1 dose.  24 tablet  0   No current facility-administered medications for this visit.   OBJECTIVE: Filed Vitals:   10/08/14 0921  BP: 171/101  Pulse: 102  Temp: 98.6 F (37 C)  Resp: 14   Body mass index is 27.45 kg/(m^2). ECOG FS:0 - Asymptomatic Ocular: Sclerae unicteric, pupils equal, round and reactive to light Ear-nose-throat: Oropharynx clear, dentition fair Lymphatic: No cervical or supraclavicular adenopathy Lungs no rales or rhonchi, good excursion bilaterally Heart regular rate and rhythm, no murmur appreciated Abd soft, nontender, positive bowel sounds MSK no focal spinal tenderness, no joint edema Neuro: non-focal, well-oriented, appropriate affect Breasts: Wound is still draining a small amount. The opening is healing nicely , it is getting smaller. The odor is much less. No changes to her breasts.    LAB RESULTS: CMP     Component Value Date/Time   NA 137 10/08/2014 0959   NA 137 04/09/2014 0450   K 3.2* 10/08/2014 0959   K 3.5* 04/09/2014 0450   CL 105 10/08/2014 0959    CL 101 04/09/2014 0450   CO2 24 10/08/2014 0959   CO2 23 04/09/2014 0450   GLUCOSE 144* 10/08/2014 0959   GLUCOSE 98 04/09/2014 0450   BUN 11 10/08/2014 0959   BUN 5* 04/09/2014 0450   CREATININE 0.6 10/08/2014 0959   CREATININE 0.59 04/09/2014 0450   CALCIUM 8.7 10/08/2014 0959   CALCIUM 9.3 04/09/2014 0450   PROT 5.8* 10/08/2014 0959   PROT 6.7 04/07/2014 0630   ALBUMIN 2.4* 04/07/2014 0630   AST 51* 10/08/2014 0959   AST 74* 04/07/2014 0630  ALT 20 10/08/2014 0959   ALT 34 04/07/2014 0630   ALKPHOS 121* 10/08/2014 0959   ALKPHOS 179* 04/07/2014 0630   BILITOT 0.70 10/08/2014 0959   BILITOT 0.7 04/07/2014 0630   GFRNONAA >90 04/09/2014 0450   GFRAA >90 04/09/2014 0450   No results found for this basename: SPEP,  UPEP,   kappa and lambda light chains   Lab Results  Component Value Date   WBC 5.3 10/08/2014   NEUTROABS 4.9 10/08/2014   HGB 10.9* 10/08/2014   HCT 33.2* 10/08/2014   MCV 99 10/08/2014   PLT 265 10/08/2014   Lab Results  Component Value Date   LABCA2 317* 08/06/2014   No components found with this basename: [id number]   No results found for this basename: INR,  in the last 168 hours  STUDIES: None  ASSESSMENT/PLAN: Ms. Haseley is 58 year old Afro-American female with metastatic breast cancer. She is responding nicely. She is asymptomatic at this time.  We will proceed with Day 1, Cycle 9 of chemo today as planned.  We discussed her CBC and CMP. We will wait and see what the rest of her labs show.  We will see her back for follow-up and labs before her next treatment. She will get a new treatment schedule today. All questions were answered and she is in agreement with the plan.  She knows to call here with any questions or concerns and to go to the ED in the event of an emergency. We can certainly see her again sooner if needed.   Eliezer Bottom, NP 10/08/2014 10:49 AM

## 2014-10-08 NOTE — Patient Instructions (Signed)
Zoledronic Acid injection (Hypercalcemia, Oncology) What is this medicine? ZOLEDRONIC ACID (ZOE le dron ik AS id) lowers the amount of calcium loss from bone. It is used to treat too much calcium in your blood from cancer. It is also used to prevent complications of cancer that has spread to the bone. This medicine may be used for other purposes; ask your health care provider or pharmacist if you have questions. COMMON BRAND NAME(S): Zometa What should I tell my health care provider before I take this medicine? They need to know if you have any of these conditions: -aspirin-sensitive asthma -cancer, especially if you are receiving medicines used to treat cancer -dental disease or wear dentures -infection -kidney disease -receiving corticosteroids like dexamethasone or prednisone -an unusual or allergic reaction to zoledronic acid, other medicines, foods, dyes, or preservatives -pregnant or trying to get pregnant -breast-feeding How should I use this medicine? This medicine is for infusion into a vein. It is given by a health care professional in a hospital or clinic setting. Talk to your pediatrician regarding the use of this medicine in children. Special care may be needed. Overdosage: If you think you have taken too much of this medicine contact a poison control center or emergency room at once. NOTE: This medicine is only for you. Do not share this medicine with others. What if I miss a dose? It is important not to miss your dose. Call your doctor or health care professional if you are unable to keep an appointment. What may interact with this medicine? -certain antibiotics given by injection -NSAIDs, medicines for pain and inflammation, like ibuprofen or naproxen -some diuretics like bumetanide, furosemide -teriparatide -thalidomide This list may not describe all possible interactions. Give your health care provider a list of all the medicines, herbs, non-prescription drugs, or  dietary supplements you use. Also tell them if you smoke, drink alcohol, or use illegal drugs. Some items may interact with your medicine. What should I watch for while using this medicine? Visit your doctor or health care professional for regular checkups. It may be some time before you see the benefit from this medicine. Do not stop taking your medicine unless your doctor tells you to. Your doctor may order blood tests or other tests to see how you are doing. Women should inform their doctor if they wish to become pregnant or think they might be pregnant. There is a potential for serious side effects to an unborn child. Talk to your health care professional or pharmacist for more information. You should make sure that you get enough calcium and vitamin D while you are taking this medicine. Discuss the foods you eat and the vitamins you take with your health care professional. Some people who take this medicine have severe bone, joint, and/or muscle pain. This medicine may also increase your risk for jaw problems or a broken thigh bone. Tell your doctor right away if you have severe pain in your jaw, bones, joints, or muscles. Tell your doctor if you have any pain that does not go away or that gets worse. Tell your dentist and dental surgeon that you are taking this medicine. You should not have major dental surgery while on this medicine. See your dentist to have a dental exam and fix any dental problems before starting this medicine. Take good care of your teeth while on this medicine. Make sure you see your dentist for regular follow-up appointments. What side effects may I notice from receiving this medicine? Side effects that   you should report to your doctor or health care professional as soon as possible: -allergic reactions like skin rash, itching or hives, swelling of the face, lips, or tongue -anxiety, confusion, or depression -breathing problems -changes in vision -eye pain -feeling faint or  lightheaded, falls -jaw pain, especially after dental work -mouth sores -muscle cramps, stiffness, or weakness -trouble passing urine or change in the amount of urine Side effects that usually do not require medical attention (report to your doctor or health care professional if they continue or are bothersome): -bone, joint, or muscle pain -constipation -diarrhea -fever -hair loss -irritation at site where injected -loss of appetite -nausea, vomiting -stomach upset -trouble sleeping -trouble swallowing -weak or tired This list may not describe all possible side effects. Call your doctor for medical advice about side effects. You may report side effects to FDA at 1-800-FDA-1088. Where should I keep my medicine? This drug is given in a hospital or clinic and will not be stored at home. NOTE: This sheet is a summary. It may not cover all possible information. If you have questions about this medicine, talk to your doctor, pharmacist, or health care provider.  2015, [employer] Standard. (2013-05-25 13:03:13) Cyclophosphamide injection What is this medicine? CYCLOPHOSPHAMIDE (sye kloe FOSS fa mide) is a chemotherapy drug. It slows the growth of cancer cells. This medicine is used to treat many types of cancer like lymphoma, myeloma, leukemia, breast cancer, and ovarian cancer, to name a few. This medicine may be used for other purposes; ask your health care provider or pharmacist if you have questions. COMMON BRAND NAME(S): Cytoxan, Neosar What should I tell my health care provider before I take this medicine? They need to know if you have any of these conditions: -blood disorders -history of other chemotherapy -infection -kidney disease -liver disease -recent or ongoing radiation therapy -tumors in the bone marrow -an unusual or allergic reaction to cyclophosphamide, other chemotherapy, other medicines, foods, dyes, or preservatives -pregnant or trying to get  pregnant -breast-feeding How should I use this medicine? This drug is usually given as an injection into a vein or muscle or by infusion into a vein. It is administered in a hospital or clinic by a specially trained health care professional. Talk to your pediatrician regarding the use of this medicine in children. Special care may be needed. Overdosage: If you think you have taken too much of this medicine contact a poison control center or emergency room at once. NOTE: This medicine is only for you. Do not share this medicine with others. What if I miss a dose? It is important not to miss your dose. Call your doctor or health care professional if you are unable to keep an appointment. What may interact with this medicine? This medicine may interact with the following medications: -amiodarone -amphotericin B -azathioprine -certain antiviral medicines for HIV or AIDS such as protease inhibitors (e.g., indinavir, ritonavir) and zidovudine -certain blood pressure medications such as benazepril, captopril, enalapril, fosinopril, lisinopril, moexipril, monopril, perindopril, quinapril, ramipril, trandolapril -certain cancer medications such as anthracyclines (e.g., daunorubicin, doxorubicin), busulfan, cytarabine, paclitaxel, pentostatin, tamoxifen, trastuzumab -certain diuretics such as chlorothiazide, chlorthalidone, hydrochlorothiazide, indapamide, metolazone -certain medicines that treat or prevent blood clots like warfarin -certain muscle relaxants such as succinylcholine -cyclosporine -etanercept -indomethacin -medicines to increase blood counts like filgrastim, pegfilgrastim, sargramostim -medicines used as general anesthesia -metronidazole -natalizumab This list may not describe all possible interactions. Give your health care provider a list of all the medicines, herbs, non-prescription drugs, or dietary supplements  you use. Also tell them if you smoke, drink alcohol, or use illegal  drugs. Some items may interact with your medicine. What should I watch for while using this medicine? Visit your doctor for checks on your progress. This drug may make you feel generally unwell. This is not uncommon, as chemotherapy can affect healthy cells as well as cancer cells. Report any side effects. Continue your course of treatment even though you feel ill unless your doctor tells you to stop. Drink water or other fluids as directed. Urinate often, even at night. In some cases, you may be given additional medicines to help with side effects. Follow all directions for their use. Call your doctor or health care professional for advice if you get a fever, chills or sore throat, or other symptoms of a cold or flu. Do not treat yourself. This drug decreases your body's ability to fight infections. Try to avoid being around people who are sick. This medicine may increase your risk to bruise or bleed. Call your doctor or health care professional if you notice any unusual bleeding. Be careful brushing and flossing your teeth or using a toothpick because you may get an infection or bleed more easily. If you have any dental work done, tell your dentist you are receiving this medicine. You may get drowsy or dizzy. Do not drive, use machinery, or do anything that needs mental alertness until you know how this medicine affects you. Do not become pregnant while taking this medicine or for 1 year after stopping it. Women should inform their doctor if they wish to become pregnant or think they might be pregnant. Men should not father a child while taking this medicine and for 4 months after stopping it. There is a potential for serious side effects to an unborn child. Talk to your health care professional or pharmacist for more information. Do not breast-feed an infant while taking this medicine. This medicine may interfere with the ability to have a child. This medicine has caused ovarian failure in some women.  This medicine has caused reduced sperm counts in some men. You should talk with your doctor or health care professional if you are concerned about your fertility. If you are going to have surgery, tell your doctor or health care professional that you have taken this medicine. What side effects may I notice from receiving this medicine? Side effects that you should report to your doctor or health care professional as soon as possible: -allergic reactions like skin rash, itching or hives, swelling of the face, lips, or tongue -low blood counts - this medicine may decrease the number of white blood cells, red blood cells and platelets. You may be at increased risk for infections and bleeding. -signs of infection - fever or chills, cough, sore throat, pain or difficulty passing urine -signs of decreased platelets or bleeding - bruising, pinpoint red spots on the skin, black, tarry stools, blood in the urine -signs of decreased red blood cells - unusually weak or tired, fainting spells, lightheadedness -breathing problems -dark urine -dizziness -palpitations -swelling of the ankles, feet, hands -trouble passing urine or change in the amount of urine -weight gain -yellowing of the eyes or skin Side effects that usually do not require medical attention (report to your doctor or health care professional if they continue or are bothersome): -changes in nail or skin color -hair loss -missed menstrual periods -mouth sores -nausea, vomiting This list may not describe all possible side effects. Call your doctor for medical  advice about side effects. You may report side effects to FDA at 1-800-FDA-1088. Where should I keep my medicine? This drug is given in a hospital or clinic and will not be stored at home. NOTE: This sheet is a summary. It may not cover all possible information. If you have questions about this medicine, talk to your doctor, pharmacist, or health care provider.  2015, [employer]  Standard. (2012-10-28 16:22:58) Docetaxel injection What is this medicine? DOCETAXEL (doe se TAX el) is a chemotherapy drug. It targets fast dividing cells, like cancer cells, and causes these cells to die. This medicine is used to treat many types of cancers like breast cancer, certain stomach cancers, head and neck cancer, lung cancer, and prostate cancer. This medicine may be used for other purposes; ask your health care provider or pharmacist if you have questions. COMMON BRAND NAME(S): Docefrez, Taxotere What should I tell my health care provider before I take this medicine? They need to know if you have any of these conditions: -infection (especially a virus infection such as chickenpox, cold sores, or herpes) -liver disease -low blood counts, like low white cell, platelet, or red cell counts -an unusual or allergic reaction to docetaxel, polysorbate 80, other chemotherapy agents, other medicines, foods, dyes, or preservatives -pregnant or trying to get pregnant -breast-feeding How should I use this medicine? This drug is given as an infusion into a vein. It is administered in a hospital or clinic by a specially trained health care professional. Talk to your pediatrician regarding the use of this medicine in children. Special care may be needed. Overdosage: If you think you have taken too much of this medicine contact a poison control center or emergency room at once. NOTE: This medicine is only for you. Do not share this medicine with others. What if I miss a dose? It is important not to miss your dose. Call your doctor or health care professional if you are unable to keep an appointment. What may interact with this medicine? -cyclosporine -erythromycin -ketoconazole -medicines to increase blood counts like filgrastim, pegfilgrastim, sargramostim -vaccines Talk to your doctor or health care professional before taking any of these  medicines: -acetaminophen -aspirin -ibuprofen -ketoprofen -naproxen This list may not describe all possible interactions. Give your health care provider a list of all the medicines, herbs, non-prescription drugs, or dietary supplements you use. Also tell them if you smoke, drink alcohol, or use illegal drugs. Some items may interact with your medicine. What should I watch for while using this medicine? Your condition will be monitored carefully while you are receiving this medicine. You will need important blood work done while you are taking this medicine. This drug may make you feel generally unwell. This is not uncommon, as chemotherapy can affect healthy cells as well as cancer cells. Report any side effects. Continue your course of treatment even though you feel ill unless your doctor tells you to stop. In some cases, you may be given additional medicines to help with side effects. Follow all directions for their use. Call your doctor or health care professional for advice if you get a fever, chills or sore throat, or other symptoms of a cold or flu. Do not treat yourself. This drug decreases your body's ability to fight infections. Try to avoid being around people who are sick. This medicine may increase your risk to bruise or bleed. Call your doctor or health care professional if you notice any unusual bleeding. Be careful brushing and flossing your teeth or using  a toothpick because you may get an infection or bleed more easily. If you have any dental work done, tell your dentist you are receiving this medicine. Avoid taking products that contain aspirin, acetaminophen, ibuprofen, naproxen, or ketoprofen unless instructed by your doctor. These medicines may hide a fever. This medicine contains an alcohol in the product. You may get drowsy or dizzy. Do not drive, use machinery, or do anything that needs mental alertness until you know how this medicine affects you. Do not stand or sit up  quickly, especially if you are an older patient. This reduces the risk of dizzy or fainting spells. Avoid alcoholic drinks Do not become pregnant while taking this medicine. Women should inform their doctor if they wish to become pregnant or think they might be pregnant. There is a potential for serious side effects to an unborn child. Talk to your health care professional or pharmacist for more information. Do not breast-feed an infant while taking this medicine. What side effects may I notice from receiving this medicine? Side effects that you should report to your doctor or health care professional as soon as possible: -allergic reactions like skin rash, itching or hives, swelling of the face, lips, or tongue -low blood counts - This drug may decrease the number of white blood cells, red blood cells and platelets. You may be at increased risk for infections and bleeding. -signs of infection - fever or chills, cough, sore throat, pain or difficulty passing urine -signs of decreased platelets or bleeding - bruising, pinpoint red spots on the skin, black, tarry stools, nosebleeds -signs of decreased red blood cells - unusually weak or tired, fainting spells, lightheadedness -breathing problems -fast or irregular heartbeat -low blood pressure -mouth sores -nausea and vomiting -pain, swelling, redness or irritation at the injection site -pain, tingling, numbness in the hands or feet -swelling of the ankle, feet, hands -weight gain Side effects that usually do not require medical attention (report to your prescriber or health care professional if they continue or are bothersome): -bone pain -complete hair loss including hair on your head, underarms, pubic hair, eyebrows, and eyelashes -diarrhea -excessive tearing -changes in the color of fingernails -loosening of the fingernails -nausea -muscle pain -red flush to skin -sweating -weak or tired This list may not describe all possible side  effects. Call your doctor for medical advice about side effects. You may report side effects to FDA at 1-800-FDA-1088. Where should I keep my medicine? This drug is given in a hospital or clinic and will not be stored at home. NOTE: This sheet is a summary. It may not cover all possible information. If you have questions about this medicine, talk to your doctor, pharmacist, or health care provider.  2015, [employer] Standard. (2013-11-09 22:21:02) Pertuzumab injection What is this medicine? PERTUZUMAB (per TOOZ ue mab) is a monoclonal antibody that targets a protein called HER2. HER2 is found in some breast cancers. This medicine can stop cancer cell growth. This medicine is used with other cancer treatments. This medicine may be used for other purposes; ask your health care provider or pharmacist if you have questions. COMMON BRAND NAME(S): PERJETA What should I tell my health care provider before I take this medicine? They need to know if you have any of these conditions: -heart disease -heart failure -high blood pressure -history of irregular heart beat -recent or ongoing radiation therapy -an unusual or allergic reaction to pertuzumab, other medicines, foods, dyes, or preservatives -pregnant or trying to get pregnant -breast-feeding How  should I use this medicine? This medicine is for infusion into a vein. It is given by a health care professional in a hospital or clinic setting. Talk to your pediatrician regarding the use of this medicine in children. Special care may be needed. Overdosage: If you think you've taken too much of this medicine contact a poison control center or emergency room at once. Overdosage: If you think you have taken too much of this medicine contact a poison control center or emergency room at once. NOTE: This medicine is only for you. Do not share this medicine with others. What if I miss a dose? It is important not to miss your dose. Call your doctor or  health care professional if you are unable to keep an appointment. What may interact with this medicine? Interactions are not expected. Give your health care provider a list of all the medicines, herbs, non-prescription drugs, or dietary supplements you use. Also tell them if you smoke, drink alcohol, or use illegal drugs. Some items may interact with your medicine. This list may not describe all possible interactions. Give your health care provider a list of all the medicines, herbs, non-prescription drugs, or dietary supplements you use. Also tell them if you smoke, drink alcohol, or use illegal drugs. Some items may interact with your medicine. What should I watch for while using this medicine? Your condition will be monitored carefully while you are receiving this medicine. Report any side effects. Continue your course of treatment even though you feel ill unless your doctor tells you to stop. Do not become pregnant while taking this medicine. Women should inform their doctor if they wish to become pregnant or think they might be pregnant. There is a potential for serious side effects to an unborn child. Talk to your health care professional or pharmacist for more information. Do not breast-feed an infant while taking this medicine. Call your doctor or health care professional for advice if you get a fever, chills or sore throat, or other symptoms of a cold or flu. Do not treat yourself. Try to avoid being around people who are sick. You may experience fever, chills, and headache during the infusion. Report any side effects during the infusion to your health care professional. What side effects may I notice from receiving this medicine? Side effects that you should report to your doctor or health care professional as soon as possible: -breathing problems -chest pain or palpitations -dizziness -feeling faint or lightheaded -fever or chills -skin rash, itching or hives -sore throat -swelling of  the face, lips, or tongue -swelling of the legs or ankles -unusually weak or tired Side effects that usually do not require medical attention (Report these to your doctor or health care professional if they continue or are bothersome.): -diarrhea -hair loss -nausea, vomiting -tiredness This list may not describe all possible side effects. Call your doctor for medical advice about side effects. You may report side effects to FDA at 1-800-FDA-1088. Where should I keep my medicine? This drug is given in a hospital or clinic and will not be stored at home. NOTE: This sheet is a summary. It may not cover all possible information. If you have questions about this medicine, talk to your doctor, pharmacist, or health care provider.  2015, [employer] Standard. (2012-10-12 16:54:15) Trastuzumab injection for infusion What is this medicine? TRASTUZUMAB (tras TOO zoo mab) is a monoclonal antibody. It targets a protein called HER2. This protein is found in some stomach and breast cancers. This medicine  can stop cancer cell growth. This medicine may be used with other cancer treatments. This medicine may be used for other purposes; ask your health care provider or pharmacist if you have questions. COMMON BRAND NAME(S): Herceptin What should I tell my health care provider before I take this medicine? They need to know if you have any of these conditions: -heart disease -heart failure -infection (especially a virus infection such as chickenpox, cold sores, or herpes) -lung or breathing disease, like asthma -recent or ongoing radiation therapy -an unusual or allergic reaction to trastuzumab, benzyl alcohol, or other medications, foods, dyes, or preservatives -pregnant or trying to get pregnant -breast-feeding How should I use this medicine? This drug is given as an infusion into a vein. It is administered in a hospital or clinic by a specially trained health care professional. Talk to your  pediatrician regarding the use of this medicine in children. This medicine is not approved for use in children. Overdosage: If you think you have taken too much of this medicine contact a poison control center or emergency room at once. NOTE: This medicine is only for you. Do not share this medicine with others. What if I miss a dose? It is important not to miss a dose. Call your doctor or health care professional if you are unable to keep an appointment. What may interact with this medicine? -cyclophosphamide -doxorubicin -warfarin This list may not describe all possible interactions. Give your health care provider a list of all the medicines, herbs, non-prescription drugs, or dietary supplements you use. Also tell them if you smoke, drink alcohol, or use illegal drugs. Some items may interact with your medicine. What should I watch for while using this medicine? Visit your doctor for checks on your progress. Report any side effects. Continue your course of treatment even though you feel ill unless your doctor tells you to stop. Call your doctor or health care professional for advice if you get a fever, chills or sore throat, or other symptoms of a cold or flu. Do not treat yourself. Try to avoid being around people who are sick. You may experience fever, chills and shaking during your first infusion. These effects are usually mild and can be treated with other medicines. Report any side effects during the infusion to your health care professional. Fever and chills usually do not happen with later infusions. What side effects may I notice from receiving this medicine? Side effects that you should report to your doctor or other health care professional as soon as possible: -breathing difficulties -chest pain or palpitations -cough -dizziness or fainting -fever or chills, sore throat -skin rash, itching or hives -swelling of the legs or ankles -unusually weak or tired Side effects that usually  do not require medical attention (report to your doctor or other health care professional if they continue or are bothersome): -loss of appetite -headache -muscle aches -nausea This list may not describe all possible side effects. Call your doctor for medical advice about side effects. You may report side effects to FDA at 1-800-FDA-1088. Where should I keep my medicine? This drug is given in a hospital or clinic and will not be stored at home. NOTE: This sheet is a summary. It may not cover all possible information. If you have questions about this medicine, talk to your doctor, pharmacist, or health care provider.  2015, [employer]. (2009-10-18 13:43:15)

## 2014-10-09 ENCOUNTER — Ambulatory Visit (HOSPITAL_BASED_OUTPATIENT_CLINIC_OR_DEPARTMENT_OTHER): Payer: Medicaid Other

## 2014-10-09 VITALS — BP 157/81 | HR 97 | Temp 98.3°F

## 2014-10-09 DIAGNOSIS — C7951 Secondary malignant neoplasm of bone: Secondary | ICD-10-CM

## 2014-10-09 DIAGNOSIS — C50919 Malignant neoplasm of unspecified site of unspecified female breast: Secondary | ICD-10-CM

## 2014-10-09 DIAGNOSIS — C50911 Malignant neoplasm of unspecified site of right female breast: Secondary | ICD-10-CM

## 2014-10-09 DIAGNOSIS — C787 Secondary malignant neoplasm of liver and intrahepatic bile duct: Secondary | ICD-10-CM

## 2014-10-09 DIAGNOSIS — Z5189 Encounter for other specified aftercare: Secondary | ICD-10-CM

## 2014-10-09 MED ORDER — PEGFILGRASTIM INJECTION 6 MG/0.6ML
6.0000 mg | Freq: Once | SUBCUTANEOUS | Status: AC
  Administered 2014-10-09: 6 mg via SUBCUTANEOUS

## 2014-10-09 MED ORDER — PEGFILGRASTIM INJECTION 6 MG/0.6ML
SUBCUTANEOUS | Status: AC
  Filled 2014-10-09: qty 0.6

## 2014-10-09 NOTE — Patient Instructions (Signed)
Pegfilgrastim injection What is this medicine? PEGFILGRASTIM (peg fil GRA stim) is a long-acting granulocyte colony-stimulating factor that stimulates the growth of neutrophils, a type of white blood cell important in the body's fight against infection. It is used to reduce the incidence of fever and infection in patients with certain types of cancer who are receiving chemotherapy that affects the bone marrow. This medicine may be used for other purposes; ask your health care provider or pharmacist if you have questions. COMMON BRAND NAME(S): Neulasta What should I tell my health care provider before I take this medicine? They need to know if you have any of these conditions: -latex allergy -ongoing radiation therapy -sickle cell disease -skin reactions to acrylic adhesives (On-Body Injector only) -an unusual or allergic reaction to pegfilgrastim, filgrastim, other medicines, foods, dyes, or preservatives -pregnant or trying to get pregnant -breast-feeding How should I use this medicine? This medicine is for injection under the skin. If you get this medicine at home, you will be taught how to prepare and give the pre-filled syringe or how to use the On-body Injector. Refer to the patient Instructions for Use for detailed instructions. Use exactly as directed. Take your medicine at regular intervals. Do not take your medicine more often than directed. It is important that you put your used needles and syringes in a special sharps container. Do not put them in a trash can. If you do not have a sharps container, call your pharmacist or healthcare provider to get one. Talk to your pediatrician regarding the use of this medicine in children. Special care may be needed. Overdosage: If you think you have taken too much of this medicine contact a poison control center or emergency room at once. NOTE: This medicine is only for you. Do not share this medicine with others. What if I miss a dose? It is  important not to miss your dose. Call your doctor or health care professional if you miss your dose. If you miss a dose due to an On-body Injector failure or leakage, a new dose should be administered as soon as possible using a single prefilled syringe for manual use. What may interact with this medicine? Interactions have not been studied. Give your health care provider a list of all the medicines, herbs, non-prescription drugs, or dietary supplements you use. Also tell them if you smoke, drink alcohol, or use illegal drugs. Some items may interact with your medicine. This list may not describe all possible interactions. Give your health care provider a list of all the medicines, herbs, non-prescription drugs, or dietary supplements you use. Also tell them if you smoke, drink alcohol, or use illegal drugs. Some items may interact with your medicine. What should I watch for while using this medicine? You may need blood work done while you are taking this medicine. If you are going to need a MRI, CT scan, or other procedure, tell your doctor that you are using this medicine (On-Body Injector only). What side effects may I notice from receiving this medicine? Side effects that you should report to your doctor or health care professional as soon as possible: -allergic reactions like skin rash, itching or hives, swelling of the face, lips, or tongue -dizziness -fever -pain, redness, or irritation at site where injected -pinpoint red spots on the skin -shortness of breath or breathing problems -stomach or side pain, or pain at the shoulder -swelling -tiredness -trouble passing urine Side effects that usually do not require medical attention (report to your doctor   or health care professional if they continue or are bothersome): -bone pain -muscle pain This list may not describe all possible side effects. Call your doctor for medical advice about side effects. You may report side effects to FDA at  1-800-FDA-1088. Where should I keep my medicine? Keep out of the reach of children. Store pre-filled syringes in a refrigerator between 2 and 8 degrees C (36 and 46 degrees F). Do not freeze. Keep in carton to protect from light. Throw away this medicine if it is left out of the refrigerator for more than 48 hours. Throw away any unused medicine after the expiration date. NOTE: This sheet is a summary. It may not cover all possible information. If you have questions about this medicine, talk to your doctor, pharmacist, or health care provider.  2015, [employer]vier/Gold Standard. (2014-03-15 16:14:05)  

## 2014-10-26 ENCOUNTER — Other Ambulatory Visit: Payer: Self-pay | Admitting: *Deleted

## 2014-10-26 DIAGNOSIS — C50911 Malignant neoplasm of unspecified site of right female breast: Secondary | ICD-10-CM

## 2014-10-29 ENCOUNTER — Telehealth: Payer: Self-pay | Admitting: Hematology & Oncology

## 2014-10-29 ENCOUNTER — Encounter: Payer: Self-pay | Admitting: Family

## 2014-10-29 ENCOUNTER — Ambulatory Visit (HOSPITAL_BASED_OUTPATIENT_CLINIC_OR_DEPARTMENT_OTHER): Payer: Medicaid Other

## 2014-10-29 ENCOUNTER — Ambulatory Visit (HOSPITAL_BASED_OUTPATIENT_CLINIC_OR_DEPARTMENT_OTHER): Payer: Medicaid Other | Admitting: Family

## 2014-10-29 ENCOUNTER — Other Ambulatory Visit: Payer: Self-pay | Admitting: *Deleted

## 2014-10-29 ENCOUNTER — Other Ambulatory Visit: Payer: Self-pay | Admitting: Family

## 2014-10-29 ENCOUNTER — Other Ambulatory Visit (HOSPITAL_BASED_OUTPATIENT_CLINIC_OR_DEPARTMENT_OTHER): Payer: Medicaid Other | Admitting: Lab

## 2014-10-29 DIAGNOSIS — C787 Secondary malignant neoplasm of liver and intrahepatic bile duct: Secondary | ICD-10-CM

## 2014-10-29 DIAGNOSIS — C50919 Malignant neoplasm of unspecified site of unspecified female breast: Secondary | ICD-10-CM

## 2014-10-29 DIAGNOSIS — C50911 Malignant neoplasm of unspecified site of right female breast: Secondary | ICD-10-CM

## 2014-10-29 DIAGNOSIS — D509 Iron deficiency anemia, unspecified: Secondary | ICD-10-CM

## 2014-10-29 DIAGNOSIS — D649 Anemia, unspecified: Secondary | ICD-10-CM

## 2014-10-29 DIAGNOSIS — Z5112 Encounter for antineoplastic immunotherapy: Secondary | ICD-10-CM

## 2014-10-29 DIAGNOSIS — L608 Other nail disorders: Secondary | ICD-10-CM

## 2014-10-29 DIAGNOSIS — Z5111 Encounter for antineoplastic chemotherapy: Secondary | ICD-10-CM

## 2014-10-29 LAB — CBC WITH DIFFERENTIAL (CANCER CENTER ONLY)
BASO#: 0 10*3/uL (ref 0.0–0.2)
BASO%: 0.2 % (ref 0.0–2.0)
EOS%: 0 % (ref 0.0–7.0)
Eosinophils Absolute: 0 10*3/uL (ref 0.0–0.5)
HCT: 34 % — ABNORMAL LOW (ref 34.8–46.6)
HGB: 11.3 g/dL — ABNORMAL LOW (ref 11.6–15.9)
LYMPH#: 0.3 10*3/uL — AB (ref 0.9–3.3)
LYMPH%: 5.6 % — ABNORMAL LOW (ref 14.0–48.0)
MCH: 32.5 pg (ref 26.0–34.0)
MCHC: 33.2 g/dL (ref 32.0–36.0)
MCV: 98 fL (ref 81–101)
MONO#: 0 10*3/uL — AB (ref 0.1–0.9)
MONO%: 0.6 % (ref 0.0–13.0)
NEUT%: 93.6 % — ABNORMAL HIGH (ref 39.6–80.0)
NEUTROS ABS: 5 10*3/uL (ref 1.5–6.5)
Platelets: 266 10*3/uL (ref 145–400)
RBC: 3.48 10*6/uL — ABNORMAL LOW (ref 3.70–5.32)
RDW: 17.2 % — AB (ref 11.1–15.7)
WBC: 5.4 10*3/uL (ref 3.9–10.0)

## 2014-10-29 LAB — CMP (CANCER CENTER ONLY)
ALK PHOS: 123 U/L — AB (ref 26–84)
ALT: 16 U/L (ref 10–47)
AST: 29 U/L (ref 11–38)
Albumin: 3.1 g/dL — ABNORMAL LOW (ref 3.3–5.5)
BUN, Bld: 11 mg/dL (ref 7–22)
CALCIUM: 8.5 mg/dL (ref 8.0–10.3)
CHLORIDE: 105 meq/L (ref 98–108)
CO2: 22 mEq/L (ref 18–33)
CREATININE: 0.4 mg/dL — AB (ref 0.6–1.2)
Glucose, Bld: 163 mg/dL — ABNORMAL HIGH (ref 73–118)
Potassium: 3.5 mEq/L (ref 3.3–4.7)
Sodium: 137 mEq/L (ref 128–145)
Total Bilirubin: 0.8 mg/dl (ref 0.20–1.60)
Total Protein: 5.7 g/dL — ABNORMAL LOW (ref 6.4–8.1)

## 2014-10-29 LAB — LACTATE DEHYDROGENASE: LDH: 355 U/L — AB (ref 94–250)

## 2014-10-29 LAB — IRON AND TIBC CHCC
%SAT: 15 % — ABNORMAL LOW (ref 21–57)
IRON: 41 ug/dL (ref 41–142)
TIBC: 268 ug/dL (ref 236–444)
UIBC: 227 ug/dL (ref 120–384)

## 2014-10-29 LAB — FERRITIN CHCC: FERRITIN: 344 ng/mL — AB (ref 9–269)

## 2014-10-29 MED ORDER — PANTOPRAZOLE SODIUM 40 MG PO TBEC: 40.0000 mg | DELAYED_RELEASE_TABLET | Freq: Every day | ORAL | Status: DC

## 2014-10-29 MED ORDER — HEPARIN SOD (PORK) LOCK FLUSH 100 UNIT/ML IV SOLN
500.0000 [IU] | Freq: Once | INTRAVENOUS | Status: DC | PRN
  Filled 2014-10-29: qty 5

## 2014-10-29 MED ORDER — ONDANSETRON 16 MG/50ML IVPB (CHCC)
INTRAVENOUS | Status: AC
  Filled 2014-10-29: qty 16

## 2014-10-29 MED ORDER — DIPHENHYDRAMINE HCL 25 MG PO CAPS
ORAL_CAPSULE | ORAL | Status: AC
  Filled 2014-10-29: qty 2

## 2014-10-29 MED ORDER — ACETAMINOPHEN 325 MG PO TABS
ORAL_TABLET | ORAL | Status: AC
  Filled 2014-10-29: qty 2

## 2014-10-29 MED ORDER — HEPARIN SOD (PORK) LOCK FLUSH 100 UNIT/ML IV SOLN
500.0000 [IU] | Freq: Once | INTRAVENOUS | Status: AC | PRN
  Administered 2014-10-29: 500 [IU]
  Filled 2014-10-29: qty 5

## 2014-10-29 MED ORDER — DOXYCYCLINE HYCLATE 100 MG PO TABS: 100.0000 mg | ORAL_TABLET | Freq: Two times a day (BID) | ORAL | Status: DC

## 2014-10-29 MED ORDER — CYCLOPHOSPHAMIDE CHEMO INJECTION 1 GM
600.0000 mg/m2 | Freq: Once | INTRAMUSCULAR | Status: AC
  Administered 2014-10-29: 1120 mg via INTRAVENOUS
  Filled 2014-10-29: qty 56

## 2014-10-29 MED ORDER — SODIUM CHLORIDE 0.9 % IJ SOLN
3.0000 mL | INTRAMUSCULAR | Status: DC | PRN
Start: 2014-10-29 — End: 2014-10-29
  Filled 2014-10-29: qty 10

## 2014-10-29 MED ORDER — AMLODIPINE BESYLATE 5 MG PO TABS: 5.0000 mg | ORAL_TABLET | Freq: Every morning | ORAL | Status: DC

## 2014-10-29 MED ORDER — SODIUM CHLORIDE 0.9 % IV SOLN: Freq: Once | INTRAVENOUS | Status: DC

## 2014-10-29 MED ORDER — LISINOPRIL 40 MG PO TABS: 40.0000 mg | ORAL_TABLET | Freq: Every morning | ORAL | Status: DC

## 2014-10-29 MED ORDER — HEPARIN SOD (PORK) LOCK FLUSH 100 UNIT/ML IV SOLN
250.0000 [IU] | Freq: Once | INTRAVENOUS | Status: DC | PRN
  Filled 2014-10-29: qty 5

## 2014-10-29 MED ORDER — OXYCODONE HCL 5 MG PO TABS: 5.0000 mg | ORAL_TABLET | Freq: Four times a day (QID) | ORAL | Status: DC | PRN

## 2014-10-29 MED ORDER — ONDANSETRON 16 MG/50ML IVPB (CHCC)
16.0000 mg | Freq: Once | INTRAVENOUS | Status: AC
Start: 2014-10-29 — End: 2014-10-29
  Administered 2014-10-29: 16 mg via INTRAVENOUS

## 2014-10-29 MED ORDER — ALTEPLASE 2 MG IJ SOLR
2.0000 mg | Freq: Once | INTRAMUSCULAR | Status: DC | PRN
  Filled 2014-10-29: qty 2

## 2014-10-29 MED ORDER — SODIUM CHLORIDE 0.9 % IV SOLN
420.0000 mg | Freq: Once | INTRAVENOUS | Status: AC
  Administered 2014-10-29: 420 mg via INTRAVENOUS
  Filled 2014-10-29: qty 14

## 2014-10-29 MED ORDER — SODIUM CHLORIDE 0.9 % IJ SOLN
3.0000 mL | INTRAMUSCULAR | Status: DC | PRN
  Filled 2014-10-29: qty 10

## 2014-10-29 MED ORDER — SODIUM CHLORIDE 0.9 % IJ SOLN
10.0000 mL | INTRAMUSCULAR | Status: DC | PRN
  Filled 2014-10-29: qty 10

## 2014-10-29 MED ORDER — DIPHENHYDRAMINE HCL 25 MG PO CAPS
50.0000 mg | ORAL_CAPSULE | Freq: Once | ORAL | Status: AC
  Administered 2014-10-29: 50 mg via ORAL

## 2014-10-29 MED ORDER — DEXAMETHASONE SODIUM PHOSPHATE 20 MG/5ML IJ SOLN
20.0000 mg | Freq: Once | INTRAMUSCULAR | Status: AC
  Administered 2014-10-29: 20 mg via INTRAVENOUS

## 2014-10-29 MED ORDER — DEXAMETHASONE SODIUM PHOSPHATE 20 MG/5ML IJ SOLN
INTRAMUSCULAR | Status: AC
  Filled 2014-10-29: qty 5

## 2014-10-29 MED ORDER — TRASTUZUMAB CHEMO INJECTION 440 MG
6.0000 mg/kg | Freq: Once | INTRAVENOUS | Status: AC
  Administered 2014-10-29: 399 mg via INTRAVENOUS
  Filled 2014-10-29: qty 19

## 2014-10-29 MED ORDER — ACETAMINOPHEN 325 MG PO TABS
650.0000 mg | ORAL_TABLET | Freq: Once | ORAL | Status: AC
  Administered 2014-10-29: 650 mg via ORAL

## 2014-10-29 MED ORDER — SODIUM CHLORIDE 0.9 % IJ SOLN
10.0000 mL | INTRAMUSCULAR | Status: DC | PRN
  Administered 2014-10-29: 10 mL
  Filled 2014-10-29: qty 10

## 2014-10-29 MED ORDER — SODIUM CHLORIDE 0.9 % IV SOLN
Freq: Once | INTRAVENOUS | Status: AC
  Administered 2014-10-29: 10:00:00 via INTRAVENOUS

## 2014-10-29 NOTE — Patient Instructions (Signed)
Pertuzumab injection What is this medicine? PERTUZUMAB (per TOOZ ue mab) is a monoclonal antibody that targets a protein called HER2. HER2 is found in some breast cancers. This medicine can stop cancer cell growth. This medicine is used with other cancer treatments. This medicine may be used for other purposes; ask your health care provider or pharmacist if you have questions. COMMON BRAND NAME(S): PERJETA What should I tell my health care provider before I take this medicine? They need to know if you have any of these conditions: -heart disease -heart failure -high blood pressure -history of irregular heart beat -recent or ongoing radiation therapy -an unusual or allergic reaction to pertuzumab, other medicines, foods, dyes, or preservatives -pregnant or trying to get pregnant -breast-feeding How should I use this medicine? This medicine is for infusion into a vein. It is given by a health care professional in a hospital or clinic setting. Talk to your pediatrician regarding the use of this medicine in children. Special care may be needed. Overdosage: If you think you've taken too much of this medicine contact a poison control center or emergency room at once. Overdosage: If you think you have taken too much of this medicine contact a poison control center or emergency room at once. NOTE: This medicine is only for you. Do not share this medicine with others. What if I miss a dose? It is important not to miss your dose. Call your doctor or health care professional if you are unable to keep an appointment. What may interact with this medicine? Interactions are not expected. Give your health care provider a list of all the medicines, herbs, non-prescription drugs, or dietary supplements you use. Also tell them if you smoke, drink alcohol, or use illegal drugs. Some items may interact with your medicine. This list may not describe all possible interactions. Give your health care provider a  list of all the medicines, herbs, non-prescription drugs, or dietary supplements you use. Also tell them if you smoke, drink alcohol, or use illegal drugs. Some items may interact with your medicine. What should I watch for while using this medicine? Your condition will be monitored carefully while you are receiving this medicine. Report any side effects. Continue your course of treatment even though you feel ill unless your doctor tells you to stop. Do not become pregnant while taking this medicine. Women should inform their doctor if they wish to become pregnant or think they might be pregnant. There is a potential for serious side effects to an unborn child. Talk to your health care professional or pharmacist for more information. Do not breast-feed an infant while taking this medicine. Call your doctor or health care professional for advice if you get a fever, chills or sore throat, or other symptoms of a cold or flu. Do not treat yourself. Try to avoid being around people who are sick. You may experience fever, chills, and headache during the infusion. Report any side effects during the infusion to your health care professional. What side effects may I notice from receiving this medicine? Side effects that you should report to your doctor or health care professional as soon as possible: -breathing problems -chest pain or palpitations -dizziness -feeling faint or lightheaded -fever or chills -skin rash, itching or hives -sore throat -swelling of the face, lips, or tongue -swelling of the legs or ankles -unusually weak or tired Side effects that usually do not require medical attention (Report these to your doctor or health care professional if they continue or  are bothersome.): -diarrhea -hair loss -nausea, vomiting -tiredness This list may not describe all possible side effects. Call your doctor for medical advice about side effects. You may report side effects to FDA at  1-800-FDA-1088. Where should I keep my medicine? This drug is given in a hospital or clinic and will not be stored at home. NOTE: This sheet is a summary. It may not cover all possible information. If you have questions about this medicine, talk to your doctor, pharmacist, or health care provider.  2015, [employer]. (2012-10-12 16:54:15) Trastuzumab injection for infusion What is this medicine? TRASTUZUMAB (tras TOO zoo mab) is a monoclonal antibody. It targets a protein called HER2. This protein is found in some stomach and breast cancers. This medicine can stop cancer cell growth. This medicine may be used with other cancer treatments. This medicine may be used for other purposes; ask your health care provider or pharmacist if you have questions. COMMON BRAND NAME(S): Herceptin What should I tell my health care provider before I take this medicine? They need to know if you have any of these conditions: -heart disease -heart failure -infection (especially a virus infection such as chickenpox, cold sores, or herpes) -lung or breathing disease, like asthma -recent or ongoing radiation therapy -an unusual or allergic reaction to trastuzumab, benzyl alcohol, or other medications, foods, dyes, or preservatives -pregnant or trying to get pregnant -breast-feeding How should I use this medicine? This drug is given as an infusion into a vein. It is administered in a hospital or clinic by a specially trained health care professional. Talk to your pediatrician regarding the use of this medicine in children. This medicine is not approved for use in children. Overdosage: If you think you have taken too much of this medicine contact a poison control center or emergency room at once. NOTE: This medicine is only for you. Do not share this medicine with others. What if I miss a dose? It is important not to miss a dose. Call your doctor or health care professional if you are unable to keep an  appointment. What may interact with this medicine? -cyclophosphamide -doxorubicin -warfarin This list may not describe all possible interactions. Give your health care provider a list of all the medicines, herbs, non-prescription drugs, or dietary supplements you use. Also tell them if you smoke, drink alcohol, or use illegal drugs. Some items may interact with your medicine. What should I watch for while using this medicine? Visit your doctor for checks on your progress. Report any side effects. Continue your course of treatment even though you feel ill unless your doctor tells you to stop. Call your doctor or health care professional for advice if you get a fever, chills or sore throat, or other symptoms of a cold or flu. Do not treat yourself. Try to avoid being around people who are sick. You may experience fever, chills and shaking during your first infusion. These effects are usually mild and can be treated with other medicines. Report any side effects during the infusion to your health care professional. Fever and chills usually do not happen with later infusions. What side effects may I notice from receiving this medicine? Side effects that you should report to your doctor or other health care professional as soon as possible: -breathing difficulties -chest pain or palpitations -cough -dizziness or fainting -fever or chills, sore throat -skin rash, itching or hives -swelling of the legs or ankles -unusually weak or tired Side effects that usually do not require medical  attention (report to your doctor or other health care professional if they continue or are bothersome): -loss of appetite -headache -muscle aches -nausea This list may not describe all possible side effects. Call your doctor for medical advice about side effects. You may report side effects to FDA at 1-800-FDA-1088. Where should I keep my medicine? This drug is given in a hospital or clinic and will not be stored at  home. NOTE: This sheet is a summary. It may not cover all possible information. If you have questions about this medicine, talk to your doctor, pharmacist, or health care provider.  2015, [employer]. (2009-10-18 13:43:15) Cyclophosphamide injection What is this medicine? CYCLOPHOSPHAMIDE (sye kloe FOSS fa mide) is a chemotherapy drug. It slows the growth of cancer cells. This medicine is used to treat many types of cancer like lymphoma, myeloma, leukemia, breast cancer, and ovarian cancer, to name a few. This medicine may be used for other purposes; ask your health care provider or pharmacist if you have questions. COMMON BRAND NAME(S): Cytoxan, Neosar What should I tell my health care provider before I take this medicine? They need to know if you have any of these conditions: -blood disorders -history of other chemotherapy -infection -kidney disease -liver disease -recent or ongoing radiation therapy -tumors in the bone marrow -an unusual or allergic reaction to cyclophosphamide, other chemotherapy, other medicines, foods, dyes, or preservatives -pregnant or trying to get pregnant -breast-feeding How should I use this medicine? This drug is usually given as an injection into a vein or muscle or by infusion into a vein. It is administered in a hospital or clinic by a specially trained health care professional. Talk to your pediatrician regarding the use of this medicine in children. Special care may be needed. Overdosage: If you think you have taken too much of this medicine contact a poison control center or emergency room at once. NOTE: This medicine is only for you. Do not share this medicine with others. What if I miss a dose? It is important not to miss your dose. Call your doctor or health care professional if you are unable to keep an appointment. What may interact with this medicine? This medicine may interact with the following  medications: -amiodarone -amphotericin B -azathioprine -certain antiviral medicines for HIV or AIDS such as protease inhibitors (e.g., indinavir, ritonavir) and zidovudine -certain blood pressure medications such as benazepril, captopril, enalapril, fosinopril, lisinopril, moexipril, monopril, perindopril, quinapril, ramipril, trandolapril -certain cancer medications such as anthracyclines (e.g., daunorubicin, doxorubicin), busulfan, cytarabine, paclitaxel, pentostatin, tamoxifen, trastuzumab -certain diuretics such as chlorothiazide, chlorthalidone, hydrochlorothiazide, indapamide, metolazone -certain medicines that treat or prevent blood clots like warfarin -certain muscle relaxants such as succinylcholine -cyclosporine -etanercept -indomethacin -medicines to increase blood counts like filgrastim, pegfilgrastim, sargramostim -medicines used as general anesthesia -metronidazole -natalizumab This list may not describe all possible interactions. Give your health care provider a list of all the medicines, herbs, non-prescription drugs, or dietary supplements you use. Also tell them if you smoke, drink alcohol, or use illegal drugs. Some items may interact with your medicine. What should I watch for while using this medicine? Visit your doctor for checks on your progress. This drug may make you feel generally unwell. This is not uncommon, as chemotherapy can affect healthy cells as well as cancer cells. Report any side effects. Continue your course of treatment even though you feel ill unless your doctor tells you to stop. Drink water or other fluids as directed. Urinate often, even at night. In some cases,  you may be given additional medicines to help with side effects. Follow all directions for their use. Call your doctor or health care professional for advice if you get a fever, chills or sore throat, or other symptoms of a cold or flu. Do not treat yourself. This drug decreases your body's  ability to fight infections. Try to avoid being around people who are sick. This medicine may increase your risk to bruise or bleed. Call your doctor or health care professional if you notice any unusual bleeding. Be careful brushing and flossing your teeth or using a toothpick because you may get an infection or bleed more easily. If you have any dental work done, tell your dentist you are receiving this medicine. You may get drowsy or dizzy. Do not drive, use machinery, or do anything that needs mental alertness until you know how this medicine affects you. Do not become pregnant while taking this medicine or for 1 year after stopping it. Women should inform their doctor if they wish to become pregnant or think they might be pregnant. Men should not father a child while taking this medicine and for 4 months after stopping it. There is a potential for serious side effects to an unborn child. Talk to your health care professional or pharmacist for more information. Do not breast-feed an infant while taking this medicine. This medicine may interfere with the ability to have a child. This medicine has caused ovarian failure in some women. This medicine has caused reduced sperm counts in some men. You should talk with your doctor or health care professional if you are concerned about your fertility. If you are going to have surgery, tell your doctor or health care professional that you have taken this medicine. What side effects may I notice from receiving this medicine? Side effects that you should report to your doctor or health care professional as soon as possible: -allergic reactions like skin rash, itching or hives, swelling of the face, lips, or tongue -low blood counts - this medicine may decrease the number of white blood cells, red blood cells and platelets. You may be at increased risk for infections and bleeding. -signs of infection - fever or chills, cough, sore throat, pain or difficulty  passing urine -signs of decreased platelets or bleeding - bruising, pinpoint red spots on the skin, black, tarry stools, blood in the urine -signs of decreased red blood cells - unusually weak or tired, fainting spells, lightheadedness -breathing problems -dark urine -dizziness -palpitations -swelling of the ankles, feet, hands -trouble passing urine or change in the amount of urine -weight gain -yellowing of the eyes or skin Side effects that usually do not require medical attention (report to your doctor or health care professional if they continue or are bothersome): -changes in nail or skin color -hair loss -missed menstrual periods -mouth sores -nausea, vomiting This list may not describe all possible side effects. Call your doctor for medical advice about side effects. You may report side effects to FDA at 1-800-FDA-1088. Where should I keep my medicine? This drug is given in a hospital or clinic and will not be stored at home. NOTE: This sheet is a summary. It may not cover all possible information. If you have questions about this medicine, talk to your doctor, pharmacist, or health care provider.  2015, [employer]. (2012-10-28 16:22:58)

## 2014-10-29 NOTE — Telephone Encounter (Signed)
Per Md order to sch [facility] scan.  I spoke with Mary [name] and sch apt for 11/06/14.  Apt Calendar was printed out and given to patient along with instructions

## 2014-10-29 NOTE — Progress Notes (Signed)
. [place]  [phone] [phone]  ID: Mary [name] OB: 20-Apr-1956 MR#: [id number]#:[id number] Patient Care Team: Biagio Borg, MD as PCP - General (Internal Medicine)  DIAGNOSIS: Metastatic breast cancer-ER positive/HER-2 positive  INTERVAL HISTORY: Mary [name] is back today for follow-up treatment. She is having issues with her eyes, nose and finger nails draining. The eyes and nose are draining clear liquid. She says her nails are draining puss that has an odor and that she is washing her hands all the time. The wound to her right breast looks much better. The opening is much smaller, the drainage is lessening and the odor is much better. She is keeping it clean and dry with a bandage over it. She denies fever, chills, n/v, rash, cough, headaches, dizziness, SOB, chest pain, palpitations, abdominal pain, constipation, diarrhea, problems urinating, blood in urine or stool. She denies any swelling, tenderness or numbness. She still has some intermittent tingling in her fingertips that she describes as mild and tolerable. Her appetite is good and she is drinking plenty of fluids. Her weight is stable. Her CA 27.29 in October was down to 178 and LDH was 322.  CURRENT TREATMENT: Status post 5 cycles of Cytoxan/Taxotere/Herceptin/Perjeta  Zometa 4 mg IV Q3 weeks  REVIEW OF SYSTEMS: All other 10 point review of systems is negative except for those issues mentioned above.   PAST MEDICAL HISTORY: Past Medical History  Diagnosis Date  . Arthritis   . Depression   . Fainting   . Headache   . Hypertension   . Migraines   . History of blood transfusion   . Gastric ulcer   . Breast cancer metastasized to multiple sites 04/12/2014   PAST SURGICAL HISTORY: Past Surgical History  Procedure Laterality Date  . Abdominal hysterectomy  15 years go    partial   FAMILY HISTORY Family History  Problem Relation Age of Onset  . Arthritis Other   .  Hypertension Other   . Hypertension Other   . Heart disease Other   . Stroke Other    GYNECOLOGIC HISTORY:  No LMP recorded. Patient has had a hysterectomy.   SOCIAL HISTORY:  History   Social History  . Marital Status: Married    Spouse Name: N/A    Number of Children: N/A  . Years of Education: N/A   Occupational History  .      no work for 4 to 5 years   Social History Main Topics  . Smoking status: Never Smoker   . Smokeless tobacco: Never Used     Comment: never used tobacco  . Alcohol Use: No  . Drug Use: No  . Sexual Activity: Not Currently   Other Topics Concern  . Not on file   Social History Narrative   ADVANCED DIRECTIVES: <no information>  HEALTH MAINTENANCE: History  Substance Use Topics  . Smoking status: Never Smoker   . Smokeless tobacco: Never Used     Comment: never used tobacco  . Alcohol Use: No   Colonoscopy: PAP: Bone density: Lipid panel:  Allergies  Allergen Reactions  . Hydrocodone     "makes me crawl on the floor"  . Aspirin Other (See Comments)    Due to ulcers  . Penicillins Swelling    Current Outpatient Prescriptions  Medication Sig Dispense Refill  . dexamethasone (DECADRON) 4 MG tablet Take 2 tablets (8 mg total) by mouth 2 (two) times daily. Start the day before Taxotere. Then again the  day after chemo for 3 days. 30 tablet 1  . Diphenhydramine-APAP, sleep, (HEADACHE RELIEF PM) 38-500 MG TABS Take 2 tablets by mouth daily as needed (For headaches.).    Marland Kitchen escitalopram (LEXAPRO) 20 MG tablet Take 1 tablet (20 mg total) by mouth every morning. 30 tablet 4  . lidocaine-prilocaine (EMLA) cream Apply 1 application topically as needed. Apply quarter sized amount to portacath site at least one hour prior to treatment .  Cover with saran wrap 30 g 0  . LORazepam (ATIVAN) 0.5 MG tablet Take 1 tablet (0.5 mg total) by mouth every 6 (six) hours as needed (Nausea or vomiting). 30 tablet 0  . ondansetron (ZOFRAN) 8 MG tablet Take 1  tablet (8 mg total) by mouth 2 (two) times daily. Start the day after chemo for 3 days. Then take as needed for nausea or vomiting. 30 tablet 1  . potassium chloride SA (K-DUR,KLOR-CON) 20 MEQ tablet Take 1 tablet (20 mEq total) by mouth 1 day or 1 dose. 24 tablet 0  . prochlorperazine (COMPAZINE) 10 MG tablet Take 1 tablet (10 mg total) by mouth every 6 (six) hours as needed (Nausea or vomiting). 30 tablet 1  . SUMAtriptan (IMITREX) 100 MG tablet Take 1 tablet (100 mg total) by mouth every 2 (two) hours as needed for migraine. 10 tablet 3  . amLODipine (NORVASC) 5 MG tablet Take 1 tablet (5 mg total) by mouth every morning. 30 tablet 4  . doxycycline (VIBRA-TABS) 100 MG tablet Take 1 tablet (100 mg total) by mouth 2 (two) times daily. 28 tablet 0  . lisinopril (PRINIVIL,ZESTRIL) 40 MG tablet Take 1 tablet (40 mg total) by mouth every morning. 30 tablet 4  . oxyCODONE (OXY IR/ROXICODONE) 5 MG immediate release tablet Take 1 tablet (5 mg total) by mouth every 6 (six) hours as needed for severe pain. 30 tablet 0  . pantoprazole (PROTONIX) 40 MG tablet Take 1 tablet (40 mg total) by mouth daily. 30 tablet 2   No current facility-administered medications for this visit.   OBJECTIVE: Filed Vitals:   10/29/14 0925  BP: 160/89  Pulse: 108  Temp: 98.8 F (37.1 C)  Resp: 20   Body mass index is 27.28 kg/(m^2). ECOG FS:0 - Asymptomatic Ocular: Sclerae unicteric, pupils equal, round and reactive to light Ear-nose-throat: Oropharynx clear, dentition fair Lymphatic: No cervical or supraclavicular adenopathy Lungs no rales or rhonchi, good excursion bilaterally Heart regular rate and rhythm, no murmur appreciated Abd soft, nontender, positive bowel sounds MSK no focal spinal tenderness, no joint edema Neuro: non-focal, well-oriented, appropriate affect Breasts: Wound is still draining a small amount. The opening is healing nicely , it is getting smaller. The odor is much less. No changes to her  breasts.    LAB RESULTS: CMP     Component Value Date/Time   NA 137 10/08/2014 0959   NA 137 04/09/2014 0450   K 3.2* 10/08/2014 0959   K 3.5* 04/09/2014 0450   CL 105 10/08/2014 0959   CL 101 04/09/2014 0450   CO2 24 10/08/2014 0959   CO2 23 04/09/2014 0450   GLUCOSE 144* 10/08/2014 0959   GLUCOSE 98 04/09/2014 0450   BUN 11 10/08/2014 0959   BUN 5* 04/09/2014 0450   CREATININE 0.6 10/08/2014 0959   CREATININE 0.59 04/09/2014 0450   CALCIUM 8.7 10/08/2014 0959   CALCIUM 9.3 04/09/2014 0450   PROT 5.8* 10/08/2014 0959   PROT 6.7 04/07/2014 0630   ALBUMIN 2.4* 04/07/2014 0630   AST  51* 10/08/2014 0959   AST 74* 04/07/2014 0630   ALT 20 10/08/2014 0959   ALT 34 04/07/2014 0630   ALKPHOS 121* 10/08/2014 0959   ALKPHOS 179* 04/07/2014 0630   BILITOT 0.70 10/08/2014 0959   BILITOT 0.7 04/07/2014 0630   GFRNONAA >90 04/09/2014 0450   GFRAA >90 04/09/2014 0450   No results found for: SPEP Lab Results  Component Value Date   WBC 5.4 10/29/2014   NEUTROABS 5.0 10/29/2014   HGB 11.3* 10/29/2014   HCT 34.0* 10/29/2014   MCV 98 10/29/2014   PLT 266 10/29/2014   Lab Results  Component Value Date   LABCA2 178* 10/08/2014   No components found for: [id number] No results for input(s): INR in the last 168 hours.  STUDIES: None  ASSESSMENT/PLAN: Mary [name] is 58 year old Afro-American female with metastatic breast cancer. She is responding nicely. She is having issues with the eyes, nose and finger nail draining. This is from the Taxotere.  We will hold the Taxotere today and see if this helps.  We will start her on Doxycycline BID for 14 days. I told here there is a good possibility she will lose some of her fingernails. She will also use moisturizing eyedrops and ocean spray nasal spray to flush her eyes and nose.  Her CBC today looks good. We will wait and see what the rest of her labs show.  I also ordered a MUGA scan to be done on her before her next follow-up.  We will  see her back for follow-up and labs before her next treatment. All questions were answered and she is in agreement with the plan.  She knows to call here with any questions or concerns and to go to the ED in the event of an emergency. We can certainly see her again sooner if needed.   Eliezer Bottom, NP 10/29/2014 10:01 AM

## 2014-11-06 ENCOUNTER — Ambulatory Visit (HOSPITAL_COMMUNITY)
Admission: RE | Admit: 2014-11-06 | Discharge: 2014-11-06 | Disposition: A | Discharge status: Home / Self Care | Payer: Medicaid Other | Source: Ambulatory Visit | Attending: Family | Admitting: Family

## 2014-11-06 DIAGNOSIS — C50919 Malignant neoplasm of unspecified site of unspecified female breast: Secondary | ICD-10-CM | POA: Diagnosis not present

## 2014-11-06 DIAGNOSIS — C50911 Malignant neoplasm of unspecified site of right female breast: Secondary | ICD-10-CM

## 2014-11-06 MED ORDER — SODIUM PERTECHNETATE TC 99M INJECTION
23.0000 | Freq: Once | INTRAVENOUS | Status: AC | PRN
  Administered 2014-11-06: 23 via INTRAVENOUS

## 2014-11-12 ENCOUNTER — Telehealth: Payer: Self-pay | Admitting: Hematology & Oncology

## 2014-11-12 NOTE — Telephone Encounter (Signed)
Pt moved 11-23 to 11-30 MD aware

## 2014-11-19 ENCOUNTER — Ambulatory Visit: Payer: Medicaid Other

## 2014-11-19 ENCOUNTER — Telehealth: Payer: Self-pay | Admitting: *Deleted

## 2014-11-19 ENCOUNTER — Ambulatory Visit: Payer: Medicaid Other | Admitting: Hematology & Oncology

## 2014-11-19 ENCOUNTER — Other Ambulatory Visit: Payer: Medicaid Other | Admitting: Lab

## 2014-11-19 NOTE — Telephone Encounter (Signed)
Patient states her feet started to swell about one week ago. Her last chemo was 11/2. Spoke to patient and informed that chemo is not likely the source of her swelling. Instructed her to follow up with her PCP for further work up if this continues.

## 2014-11-26 ENCOUNTER — Other Ambulatory Visit (HOSPITAL_BASED_OUTPATIENT_CLINIC_OR_DEPARTMENT_OTHER): Payer: Medicaid Other | Admitting: Lab

## 2014-11-26 ENCOUNTER — Encounter: Payer: Self-pay | Admitting: Family

## 2014-11-26 ENCOUNTER — Ambulatory Visit (HOSPITAL_BASED_OUTPATIENT_CLINIC_OR_DEPARTMENT_OTHER): Payer: Medicaid Other | Admitting: Family

## 2014-11-26 ENCOUNTER — Other Ambulatory Visit: Payer: Self-pay | Admitting: *Deleted

## 2014-11-26 ENCOUNTER — Ambulatory Visit (HOSPITAL_BASED_OUTPATIENT_CLINIC_OR_DEPARTMENT_OTHER): Payer: Medicaid Other

## 2014-11-26 VITALS — BP 179/91 | HR 82 | Temp 97.8°F | Resp 14 | Ht 64.0 in | Wt 158.0 lb

## 2014-11-26 DIAGNOSIS — S21001D Unspecified open wound of right breast, subsequent encounter: Secondary | ICD-10-CM

## 2014-11-26 DIAGNOSIS — D509 Iron deficiency anemia, unspecified: Secondary | ICD-10-CM

## 2014-11-26 DIAGNOSIS — C50911 Malignant neoplasm of unspecified site of right female breast: Secondary | ICD-10-CM

## 2014-11-26 DIAGNOSIS — D649 Anemia, unspecified: Secondary | ICD-10-CM

## 2014-11-26 DIAGNOSIS — C50919 Malignant neoplasm of unspecified site of unspecified female breast: Secondary | ICD-10-CM

## 2014-11-26 DIAGNOSIS — C787 Secondary malignant neoplasm of liver and intrahepatic bile duct: Secondary | ICD-10-CM

## 2014-11-26 DIAGNOSIS — Z5112 Encounter for antineoplastic immunotherapy: Secondary | ICD-10-CM

## 2014-11-26 DIAGNOSIS — C7951 Secondary malignant neoplasm of bone: Secondary | ICD-10-CM

## 2014-11-26 DIAGNOSIS — Z5111 Encounter for antineoplastic chemotherapy: Secondary | ICD-10-CM

## 2014-11-26 LAB — CBC WITH DIFFERENTIAL (CANCER CENTER ONLY)
BASO#: 0 10*3/uL (ref 0.0–0.2)
BASO%: 0.4 % (ref 0.0–2.0)
EOS%: 0 % (ref 0.0–7.0)
Eosinophils Absolute: 0 10*3/uL (ref 0.0–0.5)
HCT: 33.6 % — ABNORMAL LOW (ref 34.8–46.6)
HGB: 11.1 g/dL — ABNORMAL LOW (ref 11.6–15.9)
LYMPH#: 0.2 10*3/uL — ABNORMAL LOW (ref 0.9–3.3)
LYMPH%: 10.1 % — ABNORMAL LOW (ref 14.0–48.0)
MCH: 32.4 pg (ref 26.0–34.0)
MCHC: 33 g/dL (ref 32.0–36.0)
MCV: 98 fL (ref 81–101)
MONO#: 0 10*3/uL — ABNORMAL LOW (ref 0.1–0.9)
MONO%: 1.7 % (ref 0.0–13.0)
NEUT%: 87.8 % — ABNORMAL HIGH (ref 39.6–80.0)
NEUTROS ABS: 2.1 10*3/uL (ref 1.5–6.5)
PLATELETS: 191 10*3/uL (ref 145–400)
RBC: 3.43 10*6/uL — ABNORMAL LOW (ref 3.70–5.32)
RDW: 15.5 % (ref 11.1–15.7)
WBC: 2.4 10*3/uL — ABNORMAL LOW (ref 3.9–10.0)

## 2014-11-26 LAB — CMP (CANCER CENTER ONLY)
ALBUMIN: 3.4 g/dL (ref 3.3–5.5)
ALT(SGPT): 27 U/L (ref 10–47)
AST: 82 U/L — AB (ref 11–38)
Alkaline Phosphatase: 190 U/L — ABNORMAL HIGH (ref 26–84)
BUN, Bld: 15 mg/dL (ref 7–22)
CALCIUM: 8.8 mg/dL (ref 8.0–10.3)
CHLORIDE: 103 meq/L (ref 98–108)
CO2: 27 mEq/L (ref 18–33)
Creat: 0.7 mg/dl (ref 0.6–1.2)
GLUCOSE: 129 mg/dL — AB (ref 73–118)
POTASSIUM: 3.2 meq/L — AB (ref 3.3–4.7)
SODIUM: 143 meq/L (ref 128–145)
TOTAL PROTEIN: 6.9 g/dL (ref 6.4–8.1)
Total Bilirubin: 0.8 mg/dl (ref 0.20–1.60)

## 2014-11-26 LAB — LACTATE DEHYDROGENASE: LDH: 330 U/L — ABNORMAL HIGH (ref 94–250)

## 2014-11-26 LAB — IRON AND TIBC CHCC
%SAT: 11 % — ABNORMAL LOW (ref 21–57)
Iron: 33 ug/dL — ABNORMAL LOW (ref 41–142)
TIBC: 300 ug/dL (ref 236–444)
UIBC: 267 ug/dL (ref 120–384)

## 2014-11-26 LAB — FERRITIN CHCC: Ferritin: 180 ng/ml (ref 9–269)

## 2014-11-26 MED ORDER — SODIUM CHLORIDE 0.9 % IV SOLN
6.0000 mg/kg | Freq: Once | INTRAVENOUS | Status: AC
  Administered 2014-11-26: 399 mg via INTRAVENOUS
  Filled 2014-11-26: qty 19

## 2014-11-26 MED ORDER — SODIUM CHLORIDE 0.9 % IV SOLN
420.0000 mg | Freq: Once | INTRAVENOUS | Status: AC
  Administered 2014-11-26: 420 mg via INTRAVENOUS
  Filled 2014-11-26: qty 14

## 2014-11-26 MED ORDER — POTASSIUM CHLORIDE CRYS ER 20 MEQ PO TBCR: 20.0000 meq | EXTENDED_RELEASE_TABLET | ORAL | Status: DC

## 2014-11-26 MED ORDER — DEXAMETHASONE SODIUM PHOSPHATE 20 MG/5ML IJ SOLN
INTRAMUSCULAR | Status: AC
  Filled 2014-11-26: qty 5

## 2014-11-26 MED ORDER — ESCITALOPRAM OXALATE 20 MG PO TABS: 20.0000 mg | ORAL_TABLET | Freq: Every morning | ORAL | Status: DC

## 2014-11-26 MED ORDER — HEPARIN SOD (PORK) LOCK FLUSH 100 UNIT/ML IV SOLN
500.0000 [IU] | Freq: Once | INTRAVENOUS | Status: AC | PRN
  Administered 2014-11-26: 500 [IU]
  Filled 2014-11-26: qty 5

## 2014-11-26 MED ORDER — DIPHENHYDRAMINE HCL 25 MG PO CAPS
ORAL_CAPSULE | ORAL | Status: AC
  Filled 2014-11-26: qty 2

## 2014-11-26 MED ORDER — PANTOPRAZOLE SODIUM 40 MG PO TBEC: 40.0000 mg | DELAYED_RELEASE_TABLET | Freq: Every day | ORAL | Status: DC

## 2014-11-26 MED ORDER — LISINOPRIL 40 MG PO TABS: 40.0000 mg | ORAL_TABLET | Freq: Every morning | ORAL | Status: DC

## 2014-11-26 MED ORDER — SODIUM CHLORIDE 0.9 % IJ SOLN
10.0000 mL | INTRAMUSCULAR | Status: DC | PRN
  Administered 2014-11-26: 10 mL
  Filled 2014-11-26: qty 10

## 2014-11-26 MED ORDER — ACETAMINOPHEN 325 MG PO TABS
ORAL_TABLET | ORAL | Status: AC
  Filled 2014-11-26: qty 2

## 2014-11-26 MED ORDER — ZOLEDRONIC ACID 4 MG/100ML IV SOLN
4.0000 mg | Freq: Once | INTRAVENOUS | Status: AC
  Administered 2014-11-26: 4 mg via INTRAVENOUS
  Filled 2014-11-26: qty 100

## 2014-11-26 MED ORDER — AMLODIPINE BESYLATE 5 MG PO TABS: 5.0000 mg | ORAL_TABLET | Freq: Every morning | ORAL | Status: DC

## 2014-11-26 MED ORDER — SODIUM CHLORIDE 0.9 % IV SOLN
Freq: Once | INTRAVENOUS | Status: AC
  Administered 2014-11-26: 10:00:00 via INTRAVENOUS

## 2014-11-26 MED ORDER — DIPHENHYDRAMINE HCL 25 MG PO CAPS
50.0000 mg | ORAL_CAPSULE | Freq: Once | ORAL | Status: AC
  Administered 2014-11-26: 50 mg via ORAL

## 2014-11-26 MED ORDER — DEXAMETHASONE SODIUM PHOSPHATE 20 MG/5ML IJ SOLN
20.0000 mg | Freq: Once | INTRAMUSCULAR | Status: AC
  Administered 2014-11-26: 20 mg via INTRAVENOUS

## 2014-11-26 MED ORDER — OXYCODONE HCL 5 MG PO TABS: 5.0000 mg | ORAL_TABLET | Freq: Four times a day (QID) | ORAL | Status: DC | PRN

## 2014-11-26 MED ORDER — ONDANSETRON 16 MG/50ML IVPB (CHCC)
INTRAVENOUS | Status: AC
  Filled 2014-11-26: qty 16

## 2014-11-26 MED ORDER — ACETAMINOPHEN 325 MG PO TABS
650.0000 mg | ORAL_TABLET | Freq: Once | ORAL | Status: AC
  Administered 2014-11-26: 650 mg via ORAL

## 2014-11-26 MED ORDER — SODIUM CHLORIDE 0.9 % IV SOLN
600.0000 mg/m2 | Freq: Once | INTRAVENOUS | Status: AC
  Administered 2014-11-26: 1120 mg via INTRAVENOUS
  Filled 2014-11-26: qty 56

## 2014-11-26 MED ORDER — ONDANSETRON 16 MG/50ML IVPB (CHCC)
16.0000 mg | Freq: Once | INTRAVENOUS | Status: AC
  Administered 2014-11-26: 16 mg via INTRAVENOUS

## 2014-11-26 MED ORDER — DOCETAXEL CHEMO INJECTION 160 MG/16ML
75.0000 mg/m2 | Freq: Once | INTRAVENOUS | Status: AC
  Administered 2014-11-26: 140 mg via INTRAVENOUS
  Filled 2014-11-26: qty 14

## 2014-11-26 NOTE — Patient Instructions (Addendum)
Pertuzumab injection What is this medicine? PERTUZUMAB (per TOOZ ue mab) is a monoclonal antibody that targets a protein called HER2. HER2 is found in some breast cancers. This medicine can stop cancer cell growth. This medicine is used with other cancer treatments. This medicine may be used for other purposes; ask your health care provider or pharmacist if you have questions. COMMON BRAND NAME(S): PERJETA What should I tell my health care provider before I take this medicine? They need to know if you have any of these conditions: -heart disease -heart failure -high blood pressure -history of irregular heart beat -recent or ongoing radiation therapy -an unusual or allergic reaction to pertuzumab, other medicines, foods, dyes, or preservatives -pregnant or trying to get pregnant -breast-feeding How should I use this medicine? This medicine is for infusion into a vein. It is given by a health care professional in a hospital or clinic setting. Talk to your pediatrician regarding the use of this medicine in children. Special care may be needed. Overdosage: If you think you've taken too much of this medicine contact a poison control center or emergency room at once. Overdosage: If you think you have taken too much of this medicine contact a poison control center or emergency room at once. NOTE: This medicine is only for you. Do not share this medicine with others. What if I miss a dose? It is important not to miss your dose. Call your doctor or health care professional if you are unable to keep an appointment. What may interact with this medicine? Interactions are not expected. Give your health care provider a list of all the medicines, herbs, non-prescription drugs, or dietary supplements you use. Also tell them if you smoke, drink alcohol, or use illegal drugs. Some items may interact with your medicine. This list may not describe all possible interactions. Give your health care provider a  list of all the medicines, herbs, non-prescription drugs, or dietary supplements you use. Also tell them if you smoke, drink alcohol, or use illegal drugs. Some items may interact with your medicine. What should I watch for while using this medicine? Your condition will be monitored carefully while you are receiving this medicine. Report any side effects. Continue your course of treatment even though you feel ill unless your doctor tells you to stop. Do not become pregnant while taking this medicine. Women should inform their doctor if they wish to become pregnant or think they might be pregnant. There is a potential for serious side effects to an unborn child. Talk to your health care professional or pharmacist for more information. Do not breast-feed an infant while taking this medicine. Call your doctor or health care professional for advice if you get a fever, chills or sore throat, or other symptoms of a cold or flu. Do not treat yourself. Try to avoid being around people who are sick. You may experience fever, chills, and headache during the infusion. Report any side effects during the infusion to your health care professional. What side effects may I notice from receiving this medicine? Side effects that you should report to your doctor or health care professional as soon as possible: -breathing problems -chest pain or palpitations -dizziness -feeling faint or lightheaded -fever or chills -skin rash, itching or hives -sore throat -swelling of the face, lips, or tongue -swelling of the legs or ankles -unusually weak or tired Side effects that usually do not require medical attention (Report these to your doctor or health care professional if they continue or  are bothersome.): -diarrhea -hair loss -nausea, vomiting -tiredness This list may not describe all possible side effects. Call your doctor for medical advice about side effects. You may report side effects to FDA at  1-800-FDA-1088. Where should I keep my medicine? This drug is given in a hospital or clinic and will not be stored at home. NOTE: This sheet is a summary. It may not cover all possible information. If you have questions about this medicine, talk to your doctor, pharmacist, or health care provider.  2015, [employer] Standard. (2012-10-12 16:54:15) Trastuzumab injection for infusion What is this medicine? TRASTUZUMAB (tras TOO zoo mab) is a monoclonal antibody. It targets a protein called HER2. This protein is found in some stomach and breast cancers. This medicine can stop cancer cell growth. This medicine may be used with other cancer treatments. This medicine may be used for other purposes; ask your health care provider or pharmacist if you have questions. COMMON BRAND NAME(S): Herceptin What should I tell my health care provider before I take this medicine? They need to know if you have any of these conditions: -heart disease -heart failure -infection (especially a virus infection such as chickenpox, cold sores, or herpes) -lung or breathing disease, like asthma -recent or ongoing radiation therapy -an unusual or allergic reaction to trastuzumab, benzyl alcohol, or other medications, foods, dyes, or preservatives -pregnant or trying to get pregnant -breast-feeding How should I use this medicine? This drug is given as an infusion into a vein. It is administered in a hospital or clinic by a specially trained health care professional. Talk to your pediatrician regarding the use of this medicine in children. This medicine is not approved for use in children. Overdosage: If you think you have taken too much of this medicine contact a poison control center or emergency room at once. NOTE: This medicine is only for you. Do not share this medicine with others. What if I miss a dose? It is important not to miss a dose. Call your doctor or health care professional if you are unable to keep an  appointment. What may interact with this medicine? -cyclophosphamide -doxorubicin -warfarin This list may not describe all possible interactions. Give your health care provider a list of all the medicines, herbs, non-prescription drugs, or dietary supplements you use. Also tell them if you smoke, drink alcohol, or use illegal drugs. Some items may interact with your medicine. What should I watch for while using this medicine? Visit your doctor for checks on your progress. Report any side effects. Continue your course of treatment even though you feel ill unless your doctor tells you to stop. Call your doctor or health care professional for advice if you get a fever, chills or sore throat, or other symptoms of a cold or flu. Do not treat yourself. Try to avoid being around people who are sick. You may experience fever, chills and shaking during your first infusion. These effects are usually mild and can be treated with other medicines. Report any side effects during the infusion to your health care professional. Fever and chills usually do not happen with later infusions. What side effects may I notice from receiving this medicine? Side effects that you should report to your doctor or other health care professional as soon as possible: -breathing difficulties -chest pain or palpitations -cough -dizziness or fainting -fever or chills, sore throat -skin rash, itching or hives -swelling of the legs or ankles -unusually weak or tired Side effects that usually do not require medical  attention (report to your doctor or other health care professional if they continue or are bothersome): -loss of appetite -headache -muscle aches -nausea This list may not describe all possible side effects. Call your doctor for medical advice about side effects. You may report side effects to FDA at 1-800-FDA-1088. Where should I keep my medicine? This drug is given in a hospital or clinic and will not be stored at  home. NOTE: This sheet is a summary. It may not cover all possible information. If you have questions about this medicine, talk to your doctor, pharmacist, or health care provider.  2015, [employer]/Gold Standard. (2009-10-18 13:43:15) Cyclophosphamide injection What is this medicine? CYCLOPHOSPHAMIDE (sye kloe FOSS fa mide) is a chemotherapy drug. It slows the growth of cancer cells. This medicine is used to treat many types of cancer like lymphoma, myeloma, leukemia, breast cancer, and ovarian cancer, to name a few. This medicine may be used for other purposes; ask your health care provider or pharmacist if you have questions. COMMON BRAND NAME(S): Cytoxan, Neosar What should I tell my health care provider before I take this medicine? They need to know if you have any of these conditions: -blood disorders -history of other chemotherapy -infection -kidney disease -liver disease -recent or ongoing radiation therapy -tumors in the bone marrow -an unusual or allergic reaction to cyclophosphamide, other chemotherapy, other medicines, foods, dyes, or preservatives -pregnant or trying to get pregnant -breast-feeding How should I use this medicine? This drug is usually given as an injection into a vein or muscle or by infusion into a vein. It is administered in a hospital or clinic by a specially trained health care professional. Talk to your pediatrician regarding the use of this medicine in children. Special care may be needed. Overdosage: If you think you have taken too much of this medicine contact a poison control center or emergency room at once. NOTE: This medicine is only for you. Do not share this medicine with others. What if I miss a dose? It is important not to miss your dose. Call your doctor or health care professional if you are unable to keep an appointment. What may interact with this medicine? This medicine may interact with the following  medications: -amiodarone -amphotericin B -azathioprine -certain antiviral medicines for HIV or AIDS such as protease inhibitors (e.g., indinavir, ritonavir) and zidovudine -certain blood pressure medications such as benazepril, captopril, enalapril, fosinopril, lisinopril, moexipril, monopril, perindopril, quinapril, ramipril, trandolapril -certain cancer medications such as anthracyclines (e.g., daunorubicin, doxorubicin), busulfan, cytarabine, paclitaxel, pentostatin, tamoxifen, trastuzumab -certain diuretics such as chlorothiazide, chlorthalidone, hydrochlorothiazide, indapamide, metolazone -certain medicines that treat or prevent blood clots like warfarin -certain muscle relaxants such as succinylcholine -cyclosporine -etanercept -indomethacin -medicines to increase blood counts like filgrastim, pegfilgrastim, sargramostim -medicines used as general anesthesia -metronidazole -natalizumab This list may not describe all possible interactions. Give your health care provider a list of all the medicines, herbs, non-prescription drugs, or dietary supplements you use. Also tell them if you smoke, drink alcohol, or use illegal drugs. Some items may interact with your medicine. What should I watch for while using this medicine? Visit your doctor for checks on your progress. This drug may make you feel generally unwell. This is not uncommon, as chemotherapy can affect healthy cells as well as cancer cells. Report any side effects. Continue your course of treatment even though you feel ill unless your doctor tells you to stop. Drink water or other fluids as directed. Urinate often, even at night. In some cases,  you may be given additional medicines to help with side effects. Follow all directions for their use. Call your doctor or health care professional for advice if you get a fever, chills or sore throat, or other symptoms of a cold or flu. Do not treat yourself. This drug decreases your body's  ability to fight infections. Try to avoid being around people who are sick. This medicine may increase your risk to bruise or bleed. Call your doctor or health care professional if you notice any unusual bleeding. Be careful brushing and flossing your teeth or using a toothpick because you may get an infection or bleed more easily. If you have any dental work done, tell your dentist you are receiving this medicine. You may get drowsy or dizzy. Do not drive, use machinery, or do anything that needs mental alertness until you know how this medicine affects you. Do not become pregnant while taking this medicine or for 1 year after stopping it. Women should inform their doctor if they wish to become pregnant or think they might be pregnant. Men should not father a child while taking this medicine and for 4 months after stopping it. There is a potential for serious side effects to an unborn child. Talk to your health care professional or pharmacist for more information. Do not breast-feed an infant while taking this medicine. This medicine may interfere with the ability to have a child. This medicine has caused ovarian failure in some women. This medicine has caused reduced sperm counts in some men. You should talk with your doctor or health care professional if you are concerned about your fertility. If you are going to have surgery, tell your doctor or health care professional that you have taken this medicine. What side effects may I notice from receiving this medicine? Side effects that you should report to your doctor or health care professional as soon as possible: -allergic reactions like skin rash, itching or hives, swelling of the face, lips, or tongue -low blood counts - this medicine may decrease the number of white blood cells, red blood cells and platelets. You may be at increased risk for infections and bleeding. -signs of infection - fever or chills, cough, sore throat, pain or difficulty  passing urine -signs of decreased platelets or bleeding - bruising, pinpoint red spots on the skin, black, tarry stools, blood in the urine -signs of decreased red blood cells - unusually weak or tired, fainting spells, lightheadedness -breathing problems -dark urine -dizziness -palpitations -swelling of the ankles, feet, hands -trouble passing urine or change in the amount of urine -weight gain -yellowing of the eyes or skin Side effects that usually do not require medical attention (report to your doctor or health care professional if they continue or are bothersome): -changes in nail or skin color -hair loss -missed menstrual periods -mouth sores -nausea, vomiting This list may not describe all possible side effects. Call your doctor for medical advice about side effects. You may report side effects to FDA at 1-800-FDA-1088. Where should I keep my medicine? This drug is given in a hospital or clinic and will not be stored at home. NOTE: This sheet is a summary. It may not cover all possible information. If you have questions about this medicine, talk to your doctor, pharmacist, or health care provider.  2015, Elsevier/Gold Standard. (2012-10-28 16:22:58)   Docetaxel injection What is this medicine? DOCETAXEL (doe se TAX el) is a chemotherapy drug. It targets fast dividing cells, like cancer cells, and causes  these cells to die. This medicine is used to treat many types of cancers like breast cancer, certain stomach cancers, head and neck cancer, lung cancer, and prostate cancer. This medicine may be used for other purposes; ask your health care provider or pharmacist if you have questions. COMMON BRAND NAME(S): Docefrez, Taxotere What should I tell my health care provider before I take this medicine? They need to know if you have any of these conditions: -infection (especially a virus infection such as chickenpox, cold sores, or herpes) -liver disease -low blood counts, like low  white cell, platelet, or red cell counts -an unusual or allergic reaction to docetaxel, polysorbate 80, other chemotherapy agents, other medicines, foods, dyes, or preservatives -pregnant or trying to get pregnant -breast-feeding How should I use this medicine? This drug is given as an infusion into a vein. It is administered in a hospital or clinic by a specially trained health care professional. Talk to your pediatrician regarding the use of this medicine in children. Special care may be needed. Overdosage: If you think you have taken too much of this medicine contact a poison control center or emergency room at once. NOTE: This medicine is only for you. Do not share this medicine with others. What if I miss a dose? It is important not to miss your dose. Call your doctor or health care professional if you are unable to keep an appointment. What may interact with this medicine? -cyclosporine -erythromycin -ketoconazole -medicines to increase blood counts like filgrastim, pegfilgrastim, sargramostim -vaccines Talk to your doctor or health care professional before taking any of these medicines: -acetaminophen -aspirin -ibuprofen -ketoprofen -naproxen This list may not describe all possible interactions. Give your health care provider a list of all the medicines, herbs, non-prescription drugs, or dietary supplements you use. Also tell them if you smoke, drink alcohol, or use illegal drugs. Some items may interact with your medicine. What should I watch for while using this medicine? Your condition will be monitored carefully while you are receiving this medicine. You will need important blood work done while you are taking this medicine. This drug may make you feel generally unwell. This is not uncommon, as chemotherapy can affect healthy cells as well as cancer cells. Report any side effects. Continue your course of treatment even though you feel ill unless your doctor tells you to stop. In  some cases, you may be given additional medicines to help with side effects. Follow all directions for their use. Call your doctor or health care professional for advice if you get a fever, chills or sore throat, or other symptoms of a cold or flu. Do not treat yourself. This drug decreases your body's ability to fight infections. Try to avoid being around people who are sick. This medicine may increase your risk to bruise or bleed. Call your doctor or health care professional if you notice any unusual bleeding. Be careful brushing and flossing your teeth or using a toothpick because you may get an infection or bleed more easily. If you have any dental work done, tell your dentist you are receiving this medicine. Avoid taking products that contain aspirin, acetaminophen, ibuprofen, naproxen, or ketoprofen unless instructed by your doctor. These medicines may hide a fever. This medicine contains an alcohol in the product. You may get drowsy or dizzy. Do not drive, use machinery, or do anything that needs mental alertness until you know how this medicine affects you. Do not stand or sit up quickly, especially if you are an  older patient. This reduces the risk of dizzy or fainting spells. Avoid alcoholic drinks Do not become pregnant while taking this medicine. Women should inform their doctor if they wish to become pregnant or think they might be pregnant. There is a potential for serious side effects to an unborn child. Talk to your health care professional or pharmacist for more information. Do not breast-feed an infant while taking this medicine. What side effects may I notice from receiving this medicine? Side effects that you should report to your doctor or health care professional as soon as possible: -allergic reactions like skin rash, itching or hives, swelling of the face, lips, or tongue -low blood counts - This drug may decrease the number of white blood cells, red blood cells and platelets. You  may be at increased risk for infections and bleeding. -signs of infection - fever or chills, cough, sore throat, pain or difficulty passing urine -signs of decreased platelets or bleeding - bruising, pinpoint red spots on the skin, black, tarry stools, nosebleeds -signs of decreased red blood cells - unusually weak or tired, fainting spells, lightheadedness -breathing problems -fast or irregular heartbeat -low blood pressure -mouth sores -nausea and vomiting -pain, swelling, redness or irritation at the injection site -pain, tingling, numbness in the hands or feet -swelling of the ankle, feet, hands -weight gain Side effects that usually do not require medical attention (report to your prescriber or health care professional if they continue or are bothersome): -bone pain -complete hair loss including hair on your head, underarms, pubic hair, eyebrows, and eyelashes -diarrhea -excessive tearing -changes in the color of fingernails -loosening of the fingernails -nausea -muscle pain -red flush to skin -sweating -weak or tired This list may not describe all possible side effects. Call your doctor for medical advice about side effects. You may report side effects to FDA at 1-800-FDA-1088. Where should I keep my medicine? This drug is given in a hospital or clinic and will not be stored at home. NOTE: This sheet is a summary. It may not cover all possible information. If you have questions about this medicine, talk to your doctor, pharmacist, or health care provider.  2015, [employer] Standard. (2013-11-09 22:21:02)  Zoledronic Acid injection (Hypercalcemia, Oncology) What is this medicine? ZOLEDRONIC ACID (ZOE le dron ik AS id) lowers the amount of calcium loss from bone. It is used to treat too much calcium in your blood from cancer. It is also used to prevent complications of cancer that has spread to the bone. This medicine may be used for other purposes; ask your health care  provider or pharmacist if you have questions. COMMON BRAND NAME(S): Zometa What should I tell my health care provider before I take this medicine? They need to know if you have any of these conditions: -aspirin-sensitive asthma -cancer, especially if you are receiving medicines used to treat cancer -dental disease or wear dentures -infection -kidney disease -receiving corticosteroids like dexamethasone or prednisone -an unusual or allergic reaction to zoledronic acid, other medicines, foods, dyes, or preservatives -pregnant or trying to get pregnant -breast-feeding How should I use this medicine? This medicine is for infusion into a vein. It is given by a health care professional in a hospital or clinic setting. Talk to your pediatrician regarding the use of this medicine in children. Special care may be needed. Overdosage: If you think you have taken too much of this medicine contact a poison control center or emergency room at once. NOTE: This medicine is only for you.  Do not share this medicine with others. What if I miss a dose? It is important not to miss your dose. Call your doctor or health care professional if you are unable to keep an appointment. What may interact with this medicine? -certain antibiotics given by injection -NSAIDs, medicines for pain and inflammation, like ibuprofen or naproxen -some diuretics like bumetanide, furosemide -teriparatide -thalidomide This list may not describe all possible interactions. Give your health care provider a list of all the medicines, herbs, non-prescription drugs, or dietary supplements you use. Also tell them if you smoke, drink alcohol, or use illegal drugs. Some items may interact with your medicine. What should I watch for while using this medicine? Visit your doctor or health care professional for regular checkups. It may be some time before you see the benefit from this medicine. Do not stop taking your medicine unless your doctor  tells you to. Your doctor may order blood tests or other tests to see how you are doing. Women should inform their doctor if they wish to become pregnant or think they might be pregnant. There is a potential for serious side effects to an unborn child. Talk to your health care professional or pharmacist for more information. You should make sure that you get enough calcium and vitamin D while you are taking this medicine. Discuss the foods you eat and the vitamins you take with your health care professional. Some people who take this medicine have severe bone, joint, and/or muscle pain. This medicine may also increase your risk for jaw problems or a broken thigh bone. Tell your doctor right away if you have severe pain in your jaw, bones, joints, or muscles. Tell your doctor if you have any pain that does not go away or that gets worse. Tell your dentist and dental surgeon that you are taking this medicine. You should not have major dental surgery while on this medicine. See your dentist to have a dental exam and fix any dental problems before starting this medicine. Take good care of your teeth while on this medicine. Make sure you see your dentist for regular follow-up appointments. What side effects may I notice from receiving this medicine? Side effects that you should report to your doctor or health care professional as soon as possible: -allergic reactions like skin rash, itching or hives, swelling of the face, lips, or tongue -anxiety, confusion, or depression -breathing problems -changes in vision -eye pain -feeling faint or lightheaded, falls -jaw pain, especially after dental work -mouth sores -muscle cramps, stiffness, or weakness -trouble passing urine or change in the amount of urine Side effects that usually do not require medical attention (report to your doctor or health care professional if they continue or are bothersome): -bone, joint, or muscle  pain -constipation -diarrhea -fever -hair loss -irritation at site where injected -loss of appetite -nausea, vomiting -stomach upset -trouble sleeping -trouble swallowing -weak or tired This list may not describe all possible side effects. Call your doctor for medical advice about side effects. You may report side effects to FDA at 1-800-FDA-1088. Where should I keep my medicine? This drug is given in a hospital or clinic and will not be stored at home. NOTE: This sheet is a summary. It may not cover all possible information. If you have questions about this medicine, talk to your doctor, pharmacist, or health care provider.  2015, Elsevier/Gold Standard. (2013-05-25 13:03:13)

## 2014-11-26 NOTE — Progress Notes (Signed)
. [place]  [phone] [phone]) [phone]  ID: Mary [name] OB: 20-Jul-1956 MR#: [id number]#:[id number] Patient Care Team: Biagio Borg, MD as PCP - General (Internal Medicine)  DIAGNOSIS: Metastatic breast cancer-ER positive/HER-2 positive  INTERVAL HISTORY: Mary [name] is back today for follow-up and treatment. Her fingernails have dried up and fallen off. The wound to her right breast continues to heal. She has red spots around the outer portion of the wound that itch. There are no vesicles or pustules. The opening is much smaller, the drainage is lessening and the odor is much better. She is keeping it clean and dry with a bandage over it.  She denies fever, chills, n/v, rash, cough, headaches, dizziness, SOB, chest pain, palpitations, abdominal pain, constipation, diarrhea, problems urinating, blood in urine or stool.  She denies tenderness or numbness. She still has some intermittent tingling in her fingers and toes that she describes as mild and tolerable. She has some swelling in her legs. She ran out of her BP medication and is hypertensive today.  Her appetite is good and she is drinking plenty of fluids. Her weight is stable.  Her CA 27.29 in October was down to 178 and LDH in November was 355. Recent MUGA scan showed EF 61%.  CURRENT TREATMENT: Status post 5 cycles of Cytoxan/Taxotere/Herceptin/Perjeta  Zometa 4 mg IV Q3 weeks  REVIEW OF SYSTEMS: All other 10 point review of systems is negative except for those issues mentioned above.   PAST MEDICAL HISTORY: Past Medical History  Diagnosis Date  . Arthritis   . Depression   . Fainting   . Headache   . Hypertension   . Migraines   . History of blood transfusion   . Gastric ulcer   . Breast cancer metastasized to multiple sites 04/12/2014   PAST SURGICAL HISTORY: Past Surgical History  Procedure Laterality Date  . Abdominal hysterectomy  15 years go    partial   FAMILY  HISTORY Family History  Problem Relation Age of Onset  . Arthritis Other   . Hypertension Other   . Hypertension Other   . Heart disease Other   . Stroke Other    GYNECOLOGIC HISTORY:  No LMP recorded. Patient has had a hysterectomy.   SOCIAL HISTORY:  History   Social History  . Marital Status: Married    Spouse Name: N/A    Number of Children: N/A  . Years of Education: N/A   Occupational History  .      no work for 4 to 5 years   Social History Main Topics  . Smoking status: Never Smoker   . Smokeless tobacco: Never Used     Comment: never used tobacco  . Alcohol Use: No  . Drug Use: No  . Sexual Activity: Not Currently   Other Topics Concern  . Not on file   Social History Narrative   ADVANCED DIRECTIVES: <no information>  HEALTH MAINTENANCE: History  Substance Use Topics  . Smoking status: Never Smoker   . Smokeless tobacco: Never Used     Comment: never used tobacco  . Alcohol Use: No   Colonoscopy: PAP: Bone density: Lipid panel:  Allergies  Allergen Reactions  . Hydrocodone     "makes me crawl on the floor"  . Aspirin Other (See Comments)    Due to ulcers  . Penicillins Swelling    Current Outpatient Prescriptions  Medication Sig Dispense Refill  . amLODipine (NORVASC) 5 MG tablet Take 1 tablet (  5 mg total) by mouth every morning. 30 tablet 4  . dexamethasone (DECADRON) 4 MG tablet Take 2 tablets (8 mg total) by mouth 2 (two) times daily. Start the day before Taxotere. Then again the day after chemo for 3 days. 30 tablet 1  . Diphenhydramine-APAP, sleep, (HEADACHE RELIEF PM) 38-500 MG TABS Take 2 tablets by mouth daily as needed (For headaches.).    . escitalopram (LEXAPRO) 20 MG tablet Take 1 tablet (20 mg total) by mouth every morning. 30 tablet 4  . lidocaine-prilocaine (EMLA) cream Apply 1 application topically as needed. Apply quarter sized amount to portacath site at least one hour prior to treatment .  Cover with saran wrap 30 g 0  .  lisinopril (PRINIVIL,ZESTRIL) 40 MG tablet Take 1 tablet (40 mg total) by mouth every morning. 30 tablet 4  . LORazepam (ATIVAN) 0.5 MG tablet Take 1 tablet (0.5 mg total) by mouth every 6 (six) hours as needed (Nausea or vomiting). 30 tablet 0  . ondansetron (ZOFRAN) 8 MG tablet Take 1 tablet (8 mg total) by mouth 2 (two) times daily. Start the day after chemo for 3 days. Then take as needed for nausea or vomiting. 30 tablet 1  . oxyCODONE (OXY IR/ROXICODONE) 5 MG immediate release tablet Take 1 tablet (5 mg total) by mouth every 6 (six) hours as needed for severe pain. 30 tablet 0  . pantoprazole (PROTONIX) 40 MG tablet Take 1 tablet (40 mg total) by mouth daily. 30 tablet 2  . potassium chloride SA (K-DUR,KLOR-CON) 20 MEQ tablet Take 1 tablet (20 mEq total) by mouth 1 day or 1 dose. 24 tablet 0  . prochlorperazine (COMPAZINE) 10 MG tablet Take 1 tablet (10 mg total) by mouth every 6 (six) hours as needed (Nausea or vomiting). 30 tablet 1  . SUMAtriptan (IMITREX) 100 MG tablet Take 1 tablet (100 mg total) by mouth every 2 (two) hours as needed for migraine. 10 tablet 3   No current facility-administered medications for this visit.   OBJECTIVE: Filed Vitals:   11/26/14 0854  BP: 179/91  Pulse: 82  Temp: 97.8 F (36.6 C)  Resp: 14   Body mass index is 27.11 kg/(m^2). ECOG FS:0 - Asymptomatic Ocular: Sclerae unicteric, pupils equal, round and reactive to light Ear-nose-throat: Oropharynx clear, dentition fair Lymphatic: No cervical or supraclavicular adenopathy Lungs no rales or rhonchi, good excursion bilaterally Heart regular rate and rhythm, no murmur appreciated Abd soft, nontender, positive bowel sounds MSK no focal spinal tenderness, no joint edema Neuro: non-focal, well-oriented, appropriate affect Breasts: Wound is still draining a small amount. The opening is healing nicely , it is getting smaller. The odor is much less. There are red spots that itch around her wound. There are no  vesicles or pustules. No changes to her breasts.    LAB RESULTS: CMP     Component Value Date/Time   NA 137 10/29/2014 0909   NA 137 04/09/2014 0450   K 3.5 10/29/2014 0909   K 3.5* 04/09/2014 0450   CL 105 10/29/2014 0909   CL 101 04/09/2014 0450   CO2 22 10/29/2014 0909   CO2 23 04/09/2014 0450   GLUCOSE 163* 10/29/2014 0909   GLUCOSE 98 04/09/2014 0450   BUN 11 10/29/2014 0909   BUN 5* 04/09/2014 0450   CREATININE 0.4* 10/29/2014 0909   CREATININE 0.59 04/09/2014 0450   CALCIUM 8.5 10/29/2014 0909   CALCIUM 9.3 04/09/2014 0450   PROT 5.7* 10/29/2014 0909   PROT 6.7 04/07/2014   0630   ALBUMIN 2.4* 04/07/2014 0630   AST 29 10/29/2014 0909   AST 74* 04/07/2014 0630   ALT 16 10/29/2014 0909   ALT 34 04/07/2014 0630   ALKPHOS 123* 10/29/2014 0909   ALKPHOS 179* 04/07/2014 0630   BILITOT 0.80 10/29/2014 0909   BILITOT 0.7 04/07/2014 0630   GFRNONAA >90 04/09/2014 0450   GFRAA >90 04/09/2014 0450   No results found for: SPEP Lab Results  Component Value Date   WBC 2.4* 11/26/2014   NEUTROABS 2.1 11/26/2014   HGB 11.1* 11/26/2014   HCT 33.6* 11/26/2014   MCV 98 11/26/2014   PLT 191 11/26/2014   Lab Results  Component Value Date   LABCA2 178* 10/08/2014   No components found for: LABCA125 No results for input(s): INR in the last 168 hours.  STUDIES: None  ASSESSMENT/PLAN: Ms. Pincus is 58-year-old Afro-American female with metastatic breast cancer. She is responding nicely.   Her CBC today looks good. We will wait and see what the rest of her labs show.  We are reordering her BP medication. Also, she will start wearing compression stockings.  She will use antibiotic ointment on her wound along with a clean bandage to try and clear up the itching and redness on her wound. She will call if it does not look better over the next week.  Will will repeat her CT scans of chest, abdomen and pelvis in the next week.  We will give her a new treatment schedule.  We will  see her back for follow-up and labs before her next treatment. All questions were answered and she is in agreement with the plan.  She knows to call here with any questions or concerns and to go to the ED in the event of an emergency. We can certainly see her again sooner if needed.   [place],SARAH M, NP 11/26/2014 9:04 AM 

## 2014-11-27 ENCOUNTER — Ambulatory Visit (HOSPITAL_BASED_OUTPATIENT_CLINIC_OR_DEPARTMENT_OTHER): Payer: Medicaid Other

## 2014-11-27 DIAGNOSIS — C50911 Malignant neoplasm of unspecified site of right female breast: Secondary | ICD-10-CM

## 2014-11-27 DIAGNOSIS — Z5189 Encounter for other specified aftercare: Secondary | ICD-10-CM

## 2014-11-27 DIAGNOSIS — C50919 Malignant neoplasm of unspecified site of unspecified female breast: Secondary | ICD-10-CM

## 2014-11-27 MED ORDER — PEGFILGRASTIM INJECTION 6 MG/0.6ML ~~LOC~~
6.0000 mg | PREFILLED_SYRINGE | Freq: Once | SUBCUTANEOUS | Status: AC
  Administered 2014-11-27: 6 mg via SUBCUTANEOUS

## 2014-11-27 MED ORDER — PEGFILGRASTIM INJECTION 6 MG/0.6ML ~~LOC~~
PREFILLED_SYRINGE | SUBCUTANEOUS | Status: AC
  Filled 2014-11-27: qty 0.6

## 2014-11-27 NOTE — Patient Instructions (Signed)
Pegfilgrastim injection What is this medicine? PEGFILGRASTIM (peg fil GRA stim) is a long-acting granulocyte colony-stimulating factor that stimulates the growth of neutrophils, a type of white blood cell important in the body's fight against infection. It is used to reduce the incidence of fever and infection in patients with certain types of cancer who are receiving chemotherapy that affects the bone marrow. This medicine may be used for other purposes; ask your health care provider or pharmacist if you have questions. COMMON BRAND NAME(S): Neulasta What should I tell my health care provider before I take this medicine? They need to know if you have any of these conditions: -latex allergy -ongoing radiation therapy -sickle cell disease -skin reactions to acrylic adhesives (On-Body Injector only) -an unusual or allergic reaction to pegfilgrastim, filgrastim, other medicines, foods, dyes, or preservatives -pregnant or trying to get pregnant -breast-feeding How should I use this medicine? This medicine is for injection under the skin. If you get this medicine at home, you will be taught how to prepare and give the pre-filled syringe or how to use the On-body Injector. Refer to the patient Instructions for Use for detailed instructions. Use exactly as directed. Take your medicine at regular intervals. Do not take your medicine more often than directed. It is important that you put your used needles and syringes in a special sharps container. Do not put them in a trash can. If you do not have a sharps container, call your pharmacist or healthcare provider to get one. Talk to your pediatrician regarding the use of this medicine in children. Special care may be needed. Overdosage: If you think you have taken too much of this medicine contact a poison control center or emergency room at once. NOTE: This medicine is only for you. Do not share this medicine with others. What if I miss a dose? It is  important not to miss your dose. Call your doctor or health care professional if you miss your dose. If you miss a dose due to an On-body Injector failure or leakage, a new dose should be administered as soon as possible using a single prefilled syringe for manual use. What may interact with this medicine? Interactions have not been studied. Give your health care provider a list of all the medicines, herbs, non-prescription drugs, or dietary supplements you use. Also tell them if you smoke, drink alcohol, or use illegal drugs. Some items may interact with your medicine. This list may not describe all possible interactions. Give your health care provider a list of all the medicines, herbs, non-prescription drugs, or dietary supplements you use. Also tell them if you smoke, drink alcohol, or use illegal drugs. Some items may interact with your medicine. What should I watch for while using this medicine? You may need blood work done while you are taking this medicine. If you are going to need a MRI, CT scan, or other procedure, tell your doctor that you are using this medicine (On-Body Injector only). What side effects may I notice from receiving this medicine? Side effects that you should report to your doctor or health care professional as soon as possible: -allergic reactions like skin rash, itching or hives, swelling of the face, lips, or tongue -dizziness -fever -pain, redness, or irritation at site where injected -pinpoint red spots on the skin -shortness of breath or breathing problems -stomach or side pain, or pain at the shoulder -swelling -tiredness -trouble passing urine Side effects that usually do not require medical attention (report to your doctor   or health care professional if they continue or are bothersome): -bone pain -muscle pain This list may not describe all possible side effects. Call your doctor for medical advice about side effects. You may report side effects to FDA at  1-800-FDA-1088. Where should I keep my medicine? Keep out of the reach of children. Store pre-filled syringes in a refrigerator between 2 and 8 degrees C (36 and 46 degrees F). Do not freeze. Keep in carton to protect from light. Throw away this medicine if it is left out of the refrigerator for more than 48 hours. Throw away any unused medicine after the expiration date. NOTE: This sheet is a summary. It may not cover all possible information. If you have questions about this medicine, talk to your doctor, pharmacist, or health care provider.  2015, [employer]vier/Gold Standard. (2014-03-15 16:14:05)  

## 2014-11-29 ENCOUNTER — Encounter (HOSPITAL_BASED_OUTPATIENT_CLINIC_OR_DEPARTMENT_OTHER): Payer: Self-pay

## 2014-11-29 ENCOUNTER — Ambulatory Visit (HOSPITAL_BASED_OUTPATIENT_CLINIC_OR_DEPARTMENT_OTHER)
Admission: RE | Admit: 2014-11-29 | Discharge: 2014-11-29 | Disposition: A | Discharge status: Home / Self Care | Payer: Medicaid Other | Source: Ambulatory Visit | Attending: Family | Admitting: Family

## 2014-11-29 DIAGNOSIS — C50919 Malignant neoplasm of unspecified site of unspecified female breast: Secondary | ICD-10-CM | POA: Insufficient documentation

## 2014-11-29 DIAGNOSIS — Z90710 Acquired absence of both cervix and uterus: Secondary | ICD-10-CM | POA: Insufficient documentation

## 2014-11-29 DIAGNOSIS — R609 Edema, unspecified: Secondary | ICD-10-CM | POA: Diagnosis not present

## 2014-11-29 DIAGNOSIS — C50911 Malignant neoplasm of unspecified site of right female breast: Secondary | ICD-10-CM

## 2014-11-29 DIAGNOSIS — J90 Pleural effusion, not elsewhere classified: Secondary | ICD-10-CM | POA: Insufficient documentation

## 2014-11-29 DIAGNOSIS — R590 Localized enlarged lymph nodes: Secondary | ICD-10-CM | POA: Insufficient documentation

## 2014-11-29 DIAGNOSIS — D1771 Benign lipomatous neoplasm of kidney: Secondary | ICD-10-CM | POA: Diagnosis not present

## 2014-11-29 DIAGNOSIS — R0602 Shortness of breath: Secondary | ICD-10-CM | POA: Insufficient documentation

## 2014-11-29 DIAGNOSIS — C7800 Secondary malignant neoplasm of unspecified lung: Secondary | ICD-10-CM | POA: Insufficient documentation

## 2014-11-29 DIAGNOSIS — C787 Secondary malignant neoplasm of liver and intrahepatic bile duct: Secondary | ICD-10-CM | POA: Diagnosis not present

## 2014-11-29 DIAGNOSIS — C7951 Secondary malignant neoplasm of bone: Secondary | ICD-10-CM | POA: Diagnosis not present

## 2014-11-29 DIAGNOSIS — I313 Pericardial effusion (noninflammatory): Secondary | ICD-10-CM | POA: Diagnosis not present

## 2014-11-29 DIAGNOSIS — Z9221 Personal history of antineoplastic chemotherapy: Secondary | ICD-10-CM | POA: Diagnosis not present

## 2014-11-29 DIAGNOSIS — R188 Other ascites: Secondary | ICD-10-CM | POA: Insufficient documentation

## 2014-11-29 MED ORDER — IOHEXOL 300 MG/ML  SOLN
100.0000 mL | Freq: Once | INTRAMUSCULAR | Status: AC | PRN
  Administered 2014-11-29: 100 mL via INTRAVENOUS

## 2014-12-17 ENCOUNTER — Ambulatory Visit: Payer: Medicaid Other

## 2014-12-17 ENCOUNTER — Ambulatory Visit: Payer: Medicaid Other | Admitting: Family

## 2014-12-17 ENCOUNTER — Other Ambulatory Visit: Payer: Medicaid Other | Admitting: Lab

## 2014-12-18 ENCOUNTER — Ambulatory Visit: Payer: Medicaid Other

## 2014-12-24 ENCOUNTER — Ambulatory Visit: Payer: Medicaid Other

## 2014-12-24 ENCOUNTER — Other Ambulatory Visit (HOSPITAL_BASED_OUTPATIENT_CLINIC_OR_DEPARTMENT_OTHER): Payer: Medicaid Other | Admitting: Lab

## 2014-12-24 ENCOUNTER — Ambulatory Visit (HOSPITAL_BASED_OUTPATIENT_CLINIC_OR_DEPARTMENT_OTHER): Payer: Medicaid Other | Admitting: Hematology & Oncology

## 2014-12-24 ENCOUNTER — Ambulatory Visit (HOSPITAL_BASED_OUTPATIENT_CLINIC_OR_DEPARTMENT_OTHER): Payer: Medicaid Other

## 2014-12-24 ENCOUNTER — Encounter: Payer: Self-pay | Admitting: Hematology & Oncology

## 2014-12-24 ENCOUNTER — Other Ambulatory Visit: Payer: Medicaid Other | Admitting: Lab

## 2014-12-24 VITALS — BP 163/86 | HR 82 | Temp 98.4°F | Resp 18 | Ht 64.0 in | Wt 145.1 lb

## 2014-12-24 DIAGNOSIS — C787 Secondary malignant neoplasm of liver and intrahepatic bile duct: Secondary | ICD-10-CM

## 2014-12-24 DIAGNOSIS — C50919 Malignant neoplasm of unspecified site of unspecified female breast: Secondary | ICD-10-CM

## 2014-12-24 DIAGNOSIS — C50911 Malignant neoplasm of unspecified site of right female breast: Secondary | ICD-10-CM

## 2014-12-24 DIAGNOSIS — C7951 Secondary malignant neoplasm of bone: Secondary | ICD-10-CM

## 2014-12-24 DIAGNOSIS — Z5112 Encounter for antineoplastic immunotherapy: Secondary | ICD-10-CM

## 2014-12-24 LAB — CMP (CANCER CENTER ONLY)
ALBUMIN: 3.6 g/dL (ref 3.3–5.5)
ALT(SGPT): 20 U/L (ref 10–47)
AST: 48 U/L — AB (ref 11–38)
Alkaline Phosphatase: 168 U/L — ABNORMAL HIGH (ref 26–84)
BUN, Bld: 10 mg/dL (ref 7–22)
CHLORIDE: 105 meq/L (ref 98–108)
CO2: 25 mEq/L (ref 18–33)
Calcium: 9 mg/dL (ref 8.0–10.3)
Creat: 0.8 mg/dl (ref 0.6–1.2)
Glucose, Bld: 134 mg/dL — ABNORMAL HIGH (ref 73–118)
POTASSIUM: 4.1 meq/L (ref 3.3–4.7)
Sodium: 142 mEq/L (ref 128–145)
Total Bilirubin: 0.7 mg/dl (ref 0.20–1.60)
Total Protein: 6.9 g/dL (ref 6.4–8.1)

## 2014-12-24 LAB — CBC WITH DIFFERENTIAL (CANCER CENTER ONLY)
BASO#: 0 10*3/uL (ref 0.0–0.2)
BASO%: 0 % (ref 0.0–2.0)
EOS%: 0 % (ref 0.0–7.0)
Eosinophils Absolute: 0 10*3/uL (ref 0.0–0.5)
HCT: 35 % (ref 34.8–46.6)
HGB: 11.9 g/dL (ref 11.6–15.9)
LYMPH#: 0.3 10*3/uL — AB (ref 0.9–3.3)
LYMPH%: 6.6 % — ABNORMAL LOW (ref 14.0–48.0)
MCH: 32.9 pg (ref 26.0–34.0)
MCHC: 34 g/dL (ref 32.0–36.0)
MCV: 97 fL (ref 81–101)
MONO#: 0 10*3/uL — AB (ref 0.1–0.9)
MONO%: 0.7 % (ref 0.0–13.0)
NEUT%: 92.7 % — ABNORMAL HIGH (ref 39.6–80.0)
NEUTROS ABS: 3.8 10*3/uL (ref 1.5–6.5)
PLATELETS: 226 10*3/uL (ref 145–400)
RBC: 3.62 10*6/uL — ABNORMAL LOW (ref 3.70–5.32)
RDW: 15 % (ref 11.1–15.7)
WBC: 4.1 10*3/uL (ref 3.9–10.0)

## 2014-12-24 LAB — IRON AND TIBC CHCC
%SAT: 14 % — ABNORMAL LOW (ref 21–57)
Iron: 43 ug/dL (ref 41–142)
TIBC: 303 ug/dL (ref 236–444)
UIBC: 259 ug/dL (ref 120–384)

## 2014-12-24 LAB — LACTATE DEHYDROGENASE: LDH: 306 U/L — ABNORMAL HIGH (ref 94–250)

## 2014-12-24 LAB — FERRITIN CHCC: Ferritin: 228 ng/ml (ref 9–269)

## 2014-12-24 MED ORDER — SODIUM CHLORIDE 0.9 % IJ SOLN
10.0000 mL | INTRAMUSCULAR | Status: DC | PRN
Start: 2014-12-24 — End: 2014-12-24
  Administered 2014-12-24: 10 mL
  Filled 2014-12-24: qty 10

## 2014-12-24 MED ORDER — DIPHENHYDRAMINE HCL 25 MG PO CAPS
ORAL_CAPSULE | ORAL | Status: AC
  Filled 2014-12-24: qty 2

## 2014-12-24 MED ORDER — ACETAMINOPHEN 325 MG PO TABS
ORAL_TABLET | ORAL | Status: AC
  Filled 2014-12-24: qty 2

## 2014-12-24 MED ORDER — SODIUM CHLORIDE 0.9 % IV SOLN
3.6000 mg/kg | Freq: Once | INTRAVENOUS | Status: AC
  Administered 2014-12-24: 240 mg via INTRAVENOUS
  Filled 2014-12-24: qty 12

## 2014-12-24 MED ORDER — SODIUM CHLORIDE 0.9 % IV SOLN
Freq: Once | INTRAVENOUS | Status: AC
  Administered 2014-12-24: 10:00:00 via INTRAVENOUS

## 2014-12-24 MED ORDER — ZOLEDRONIC ACID 4 MG/100ML IV SOLN
4.0000 mg | Freq: Once | INTRAVENOUS | Status: AC
  Administered 2014-12-24: 4 mg via INTRAVENOUS
  Filled 2014-12-24: qty 100

## 2014-12-24 MED ORDER — DIPHENHYDRAMINE HCL 25 MG PO CAPS
50.0000 mg | ORAL_CAPSULE | Freq: Once | ORAL | Status: AC
  Administered 2014-12-24: 50 mg via ORAL

## 2014-12-24 MED ORDER — PERTUZUMAB CHEMO INJECTION 420 MG/14ML
420.0000 mg | Freq: Once | INTRAVENOUS | Status: AC
  Administered 2014-12-24: 420 mg via INTRAVENOUS
  Filled 2014-12-24: qty 14

## 2014-12-24 MED ORDER — HEPARIN SOD (PORK) LOCK FLUSH 100 UNIT/ML IV SOLN
500.0000 [IU] | Freq: Once | INTRAVENOUS | Status: AC | PRN
  Administered 2014-12-24: 500 [IU]
  Filled 2014-12-24: qty 5

## 2014-12-24 MED ORDER — ACETAMINOPHEN 325 MG PO TABS
650.0000 mg | ORAL_TABLET | Freq: Once | ORAL | Status: AC
  Administered 2014-12-24: 650 mg via ORAL

## 2014-12-24 NOTE — Patient Instructions (Signed)
Ado-Trastuzumab Emtansine for injection What is this medicine? Ado-trastuzumab emtansine (ADD oh traz TOO zuh mab em TAN zine) is a monoclonal antibody combined with chemotherapy. It targets a protein called HER2. This protein is found in some stomach and breast cancers. This medicine can stop cancer cell growth. This medicine may be used for other purposes; ask your health care provider or pharmacist if you have questions. COMMON BRAND NAME(S): Kadcyla What should I tell my health care provider before I take this medicine? They need to know if you have any of these conditions: -heart disease -heart failure -infection (especially a virus infection such as chickenpox, cold sores, or herpes) -liver disease -lung or breathing disease, like asthma -an unusual or allergic reaction to ado-trastuzumab emtansine, other medications, foods, dyes, or preservatives -pregnant or trying to get pregnant -breast-feeding How should I use this medicine? This medicine is for infusion into a vein. It is given by a health care professional in a hospital or clinic setting. Talk to your pediatrician regarding the use of this medicine in children. Special care may be needed. Overdosage: If you think you've taken too much of this medicine contact a poison control center or emergency room at once. Overdosage: If you think you have taken too much of this medicine contact a poison control center or emergency room at once. NOTE: This medicine is only for you. Do not share this medicine with others. What if I miss a dose? It is important not to miss your dose. Call your doctor or health care professional if you are unable to keep an appointment. What may interact with this medicine? This medicine may also interact with the following medications: -atazanavir -boceprevir -clarithromycin -delavirdine -indinavir -dalfopristin; quinupristin -isoniazid,  INH -itraconazole -ketoconazole -nefazodone -nelfinavir -ritonavir -telaprevir -telithromycin -tipranavir -voriconazole This list may not describe all possible interactions. Give your health care provider a list of all the medicines, herbs, non-prescription drugs, or dietary supplements you use. Also tell them if you smoke, drink alcohol, or use illegal drugs. Some items may interact with your medicine. What should I watch for while using this medicine? Visit your doctor for checks on your progress. This drug may make you feel generally unwell. This is not uncommon, as chemotherapy can affect healthy cells as well as cancer cells. Report any side effects. Continue your course of treatment even though you feel ill unless your doctor tells you to stop. Call your doctor or health care professional for advice if you get a fever, chills or sore throat, or other symptoms of a cold or flu. Do not treat yourself. This drug decreases your body's ability to fight infections. Try to avoid being around people who are sick. Be careful brushing and flossing your teeth or using a toothpick because you may get an infection or bleed more easily. If you have any dental work done, tell your dentist you are receiving this medicine. Avoid taking products that contain aspirin, acetaminophen, ibuprofen, naproxen, or ketoprofen unless instructed by your doctor. These medicines may hide a fever. Do not become pregnant while taking this medicine. Women should inform their doctor if they wish to become pregnant or think they might be pregnant. There is a potential for serious side effects to an unborn child. [Men should inform their doctors if they wish to father a child. This medicine may lower sperm counts.] Talk to your health care professional or pharmacist for more information. Do not breast-feed an infant while taking this medicine. You may need blood work  done while you are taking this medicine. What side effects may  I notice from receiving this medicine? Side effects that you should report to your doctor or health care professional as soon as possible: -allergic reactions like skin rash, itching or hives, swelling of the face, lips, or tongue -breathing problems -chest pain or palpitations -fever or chills, sore throat -general ill feeling or flu-like symptoms -light-colored stools -nausea, vomiting -pain, tingling, numbness in the hands or feet -signs and symptoms of bleeding such as bloody or black, tarry stools; red or dark-brown urine; spitting up blood or brown material that looks like coffee grounds; red spots on the skin; unusual bruising or bleeding from the eye, gums, or nose -swelling of the legs or ankles -yellowing of the eyes or skin Side effects that usually do not require medical attention (Report these to your doctor or health care professional if they continue or are bothersome.): -changes in taste -constipation -dizziness -headache -joint pain -muscle pain -trouble sleeping -unusually weak or tired This list may not describe all possible side effects. Call your doctor for medical advice about side effects. You may report side effects to FDA at 1-800-FDA-1088. Where should I keep my medicine? This drug is given in a hospital or clinic and will not be stored at home. NOTE: This sheet is a summary. It may not cover all possible information. If you have questions about this medicine, talk to your doctor, pharmacist, or health care provider.  2015, Elsevier/Gold Standard. (2013-04-25 12:55:10)  Pertuzumab injection What is this medicine? PERTUZUMAB (per TOOZ ue mab) is a monoclonal antibody that targets a protein called HER2. HER2 is found in some breast cancers. This medicine can stop cancer cell growth. This medicine is used with other cancer treatments. This medicine may be used for other purposes; ask your health care provider or pharmacist if you have questions. COMMON BRAND  NAME(S): PERJETA What should I tell my health care provider before I take this medicine? They need to know if you have any of these conditions: -heart disease -heart failure -high blood pressure -history of irregular heart beat -recent or ongoing radiation therapy -an unusual or allergic reaction to pertuzumab, other medicines, foods, dyes, or preservatives -pregnant or trying to get pregnant -breast-feeding How should I use this medicine? This medicine is for infusion into a vein. It is given by a health care professional in a hospital or clinic setting. Talk to your pediatrician regarding the use of this medicine in children. Special care may be needed. Overdosage: If you think you've taken too much of this medicine contact a poison control center or emergency room at once. Overdosage: If you think you have taken too much of this medicine contact a poison control center or emergency room at once. NOTE: This medicine is only for you. Do not share this medicine with others. What if I miss a dose? It is important not to miss your dose. Call your doctor or health care professional if you are unable to keep an appointment. What may interact with this medicine? Interactions are not expected. Give your health care provider a list of all the medicines, herbs, non-prescription drugs, or dietary supplements you use. Also tell them if you smoke, drink alcohol, or use illegal drugs. Some items may interact with your medicine. This list may not describe all possible interactions. Give your health care provider a list of all the medicines, herbs, non-prescription drugs, or dietary supplements you use. Also tell them if you smoke, drink alcohol,  or use illegal drugs. Some items may interact with your medicine. What should I watch for while using this medicine? Your condition will be monitored carefully while you are receiving this medicine. Report any side effects. Continue your course of treatment even  though you feel ill unless your doctor tells you to stop. Do not become pregnant while taking this medicine. Women should inform their doctor if they wish to become pregnant or think they might be pregnant. There is a potential for serious side effects to an unborn child. Talk to your health care professional or pharmacist for more information. Do not breast-feed an infant while taking this medicine. Call your doctor or health care professional for advice if you get a fever, chills or sore throat, or other symptoms of a cold or flu. Do not treat yourself. Try to avoid being around people who are sick. You may experience fever, chills, and headache during the infusion. Report any side effects during the infusion to your health care professional. What side effects may I notice from receiving this medicine? Side effects that you should report to your doctor or health care professional as soon as possible: -breathing problems -chest pain or palpitations -dizziness -feeling faint or lightheaded -fever or chills -skin rash, itching or hives -sore throat -swelling of the face, lips, or tongue -swelling of the legs or ankles -unusually weak or tired Side effects that usually do not require medical attention (Report these to your doctor or health care professional if they continue or are bothersome.): -diarrhea -hair loss -nausea, vomiting -tiredness This list may not describe all possible side effects. Call your doctor for medical advice about side effects. You may report side effects to FDA at 1-800-FDA-1088. Where should I keep my medicine? This drug is given in a hospital or clinic and will not be stored at home. NOTE: This sheet is a summary. It may not cover all possible information. If you have questions about this medicine, talk to your doctor, pharmacist, or health care provider.  2015, Elsevier/Gold Standard. (2012-10-12 16:54:15)

## 2014-12-24 NOTE — Progress Notes (Signed)
Per [name] pt's treatment plan has been changed to add Kadcyla and D/C Taxotere from regimen. Per Darci Current, assistance prior [place]. Ok pt may precede with treatment. Pharmacy aware.

## 2014-12-24 NOTE — Progress Notes (Signed)
Hematology and Oncology Follow Up Visit  Mary [name] [id number] [birth year] 58 y.o. 12/24/2014   Principle Diagnosis:  Metastatic breast cancer-ER positive/HER-2 positive  Current Therapy:    Status post  Cytoxan/Taxotere/Herceptin/Perjeta  Zometa 4 mg IV Q3 weeks  Patient to change therapy to Kadcyla/Perjeta     Interim History:  [name] is back for follow-up. We did go ahead and get a CT scan on her. This showed a mixed response. She has continued improvement in her hepatic disease. She has improvement in mediastinal lymphadenopathy. However, she has increased in pleural effusions. There might be a couple new pulmonary nodules.  The pleural effusion might be from the Taxotere that she is taking. However, I think that it would not be a bad idea to change therapy on her.  Of note, her CA 27.29 has improved. When it was checked back in October, it was down to 178. We first started therapy on her, her CA 27.29 was 2300. I will have to see if we checked this with this appointment.  She is eating better. She's feeling better. She's really not complaining much in the way of pain.  She still has some drainage over with the right breast. This is improved. There is no bleeding from the right breast. She's not having issues with diarrhea or constipation.  There is no obvious shortness of breath. She's had no cough. She's had no hemoptysis.  There's been no headache.  She's had no leg swelling.  Her overall performance status is ECOG 1.  Medications: Current outpatient prescriptions: amLODipine (NORVASC) 5 MG tablet, Take 1 tablet (5 mg total) by mouth every morning., Disp: 30 tablet, Rfl: 4;  Diphenhydramine-APAP, sleep, (HEADACHE RELIEF PM) 38-500 MG TABS, Take 2 tablets by mouth daily as needed (For headaches.)., Disp: , Rfl: ;  escitalopram (LEXAPRO) 20 MG tablet, Take 1 tablet (20 mg total) by mouth every morning., Disp: 30 tablet, Rfl: 4 lidocaine-prilocaine (EMLA) cream,  Apply 1 application topically as needed. Apply quarter sized amount to portacath site at least one hour prior to treatment .  Cover with saran wrap, Disp: 30 g, Rfl: 0;  lisinopril (PRINIVIL,ZESTRIL) 40 MG tablet, Take 1 tablet (40 mg total) by mouth every morning., Disp: 30 tablet, Rfl: 4 oxyCODONE (OXY IR/ROXICODONE) 5 MG immediate release tablet, Take 1 tablet (5 mg total) by mouth every 6 (six) hours as needed for severe pain., Disp: 120 tablet, Rfl: 0;  pantoprazole (PROTONIX) 40 MG tablet, Take 1 tablet (40 mg total) by mouth daily., Disp: 30 tablet, Rfl: 2;  potassium chloride SA (K-DUR,KLOR-CON) 20 MEQ tablet, Take 1 tablet (20 mEq total) by mouth 1 day or 1 dose., Disp: 30 tablet, Rfl: 0 SUMAtriptan (IMITREX) 100 MG tablet, Take 1 tablet (100 mg total) by mouth every 2 (two) hours as needed for migraine., Disp: 10 tablet, Rfl: 3 No current facility-administered medications for this visit. Facility-Administered Medications Ordered in Other Visits: sodium chloride 0.9 % injection 10 mL, 10 mL, Intracatheter, PRN, Volanda Napoleon, MD, 10 mL at 12/24/14 1410  Allergies:  Allergies  Allergen Reactions  . Hydrocodone     "makes me crawl on the floor"  . Aspirin Other (See Comments)    Due to ulcers  . Penicillins Swelling    Past Medical History, Surgical history, Social history, and Family History were reviewed and updated.  Review of Systems: As above  Physical Exam:  height is 5' 4"  (1.626 m) and weight is 145 lb 1.9 oz (65.826  kg). Her oral temperature is 98.4 F (36.9 C). Her blood pressure is 163/86 and her pulse is 82. Her respiration is 18.   Slightly thin African American female in no obvious distress. Head and neck exam shows no ocular or oral lesions. There are no palpable cervical or supraclavicular lymph nodes. Lungs are clear. Cardiac exam regular rate and rhythm with no murmurs, rubs or bruits. Breast exam shows left breast with no masses, edema or erythema. There is no  left axillary adenopathy. Right breast shows the exophytic skin nodule in the upper outer quadrant of the right breast. This 5 measures about 1 x 3 cm. There is no obvious exudate or blood. Her right breast, itself, is slightly enlarged. There may be some slight erythema about the areola area. No distinct masses noted in the right breast. I really cannot appreciate any right axillary adenopathy. Abdomen is soft. There is no palpable liver or spleen tip. There is no fluid wave. There is no palpable abdominal mass. Back exam shows no tenderness over the spine, ribs or hips. Externally shows no clubbing, cyanosis or edema. Skin exam shows no rashes, ecchymoses or petechia. There is no subcutaneous nodules that are appreciated. Neurological exam is nonfocal.  Lab Results  Component Value Date   WBC 4.1 12/24/2014   HGB 11.9 12/24/2014   HCT 35.0 12/24/2014   MCV 97 12/24/2014   PLT 226 12/24/2014     Chemistry      Component Value Date/Time   NA 142 12/24/2014 0837   NA 137 04/09/2014 0450   K 4.1 12/24/2014 0837   K 3.5* 04/09/2014 0450   CL 105 12/24/2014 0837   CL 101 04/09/2014 0450   CO2 25 12/24/2014 0837   CO2 23 04/09/2014 0450   BUN 10 12/24/2014 0837   BUN 5* 04/09/2014 0450   CREATININE 0.8 12/24/2014 0837   CREATININE 0.59 04/09/2014 0450      Component Value Date/Time   CALCIUM 9.0 12/24/2014 0837   CALCIUM 9.3 04/09/2014 0450   ALKPHOS 168* 12/24/2014 0837   ALKPHOS 179* 04/07/2014 0630   AST 48* 12/24/2014 0837   AST 74* 04/07/2014 0630   ALT 20 12/24/2014 0837   ALT 34 04/07/2014 0630   BILITOT 0.70 12/24/2014 0837   BILITOT 0.7 04/07/2014 0630         Impression and Plan: Mary [name] is 58 year old African-American female. She has metastatic breast cancer.  I'm going to make a change with her chemotherapy. I think this would be a good time to try her on Kadcyla with Perjeta. I still think she needs double HER-2 blockade.  I went over the side effects of  Kadcyla. I told her that she really should have problems with her blood counts. She should have no issues with nausea or vomiting. She will not need Neulasta.  She agrees with this. We will go ahead and get started today.  I will give her 3 cycles of treatment and then repeat her scans and see how things are looking.  We really need to repeat her CA-27-29. Hopefully, this will continue to show [place] an improvement.  I will come back in 3 more weeks.  I spent about 35 minutes with her today.  Volanda Napoleon, MD 12/28/20154:46 PM

## 2014-12-25 ENCOUNTER — Ambulatory Visit: Payer: Medicaid Other

## 2014-12-25 ENCOUNTER — Telehealth: Payer: Self-pay | Admitting: *Deleted

## 2014-12-25 NOTE — Telephone Encounter (Signed)
Called pt to see how she is feeling after getting a new chemo regimen. Pt did not answer call; left voicemail for her to call the office back.

## 2014-12-26 ENCOUNTER — Encounter: Payer: Self-pay | Admitting: Hematology & Oncology

## 2014-12-26 LAB — CANCER ANTIGEN 27.29: CA 27.29: 120 U/mL — ABNORMAL HIGH (ref 0–39)

## 2015-01-07 ENCOUNTER — Ambulatory Visit: Payer: Medicaid Other

## 2015-01-07 ENCOUNTER — Ambulatory Visit: Payer: Medicaid Other | Admitting: Hematology & Oncology

## 2015-01-07 ENCOUNTER — Other Ambulatory Visit: Payer: Medicaid Other | Admitting: Lab

## 2015-01-07 ENCOUNTER — Ambulatory Visit: Payer: Medicaid Other | Admitting: Family

## 2015-01-08 ENCOUNTER — Ambulatory Visit: Payer: Medicaid Other

## 2015-01-14 ENCOUNTER — Ambulatory Visit (HOSPITAL_BASED_OUTPATIENT_CLINIC_OR_DEPARTMENT_OTHER): Payer: Medicaid Other | Admitting: Family

## 2015-01-14 ENCOUNTER — Ambulatory Visit: Payer: Medicaid Other

## 2015-01-14 ENCOUNTER — Other Ambulatory Visit: Payer: Self-pay | Admitting: Family

## 2015-01-14 ENCOUNTER — Encounter: Payer: Self-pay | Admitting: Family

## 2015-01-14 ENCOUNTER — Other Ambulatory Visit (HOSPITAL_BASED_OUTPATIENT_CLINIC_OR_DEPARTMENT_OTHER): Payer: Medicaid Other | Admitting: Lab

## 2015-01-14 DIAGNOSIS — C50911 Malignant neoplasm of unspecified site of right female breast: Secondary | ICD-10-CM

## 2015-01-14 DIAGNOSIS — D509 Iron deficiency anemia, unspecified: Secondary | ICD-10-CM

## 2015-01-14 DIAGNOSIS — C50919 Malignant neoplasm of unspecified site of unspecified female breast: Secondary | ICD-10-CM

## 2015-01-14 LAB — CBC WITH DIFFERENTIAL (CANCER CENTER ONLY)
BASO#: 0 10*3/uL (ref 0.0–0.2)
BASO%: 0.3 % (ref 0.0–2.0)
EOS ABS: 0 10*3/uL (ref 0.0–0.5)
EOS%: 1 % (ref 0.0–7.0)
HEMATOCRIT: 34 % — AB (ref 34.8–46.6)
HGB: 11.3 g/dL — ABNORMAL LOW (ref 11.6–15.9)
LYMPH#: 0.4 10*3/uL — AB (ref 0.9–3.3)
LYMPH%: 12.4 % — AB (ref 14.0–48.0)
MCH: 32.2 pg (ref 26.0–34.0)
MCHC: 33.2 g/dL (ref 32.0–36.0)
MCV: 97 fL (ref 81–101)
MONO#: 0.1 10*3/uL (ref 0.1–0.9)
MONO%: 1.9 % (ref 0.0–13.0)
NEUT#: 2.7 10*3/uL (ref 1.5–6.5)
NEUT%: 84.4 % — ABNORMAL HIGH (ref 39.6–80.0)
Platelets: 195 10*3/uL (ref 145–400)
RBC: 3.51 10*6/uL — ABNORMAL LOW (ref 3.70–5.32)
RDW: 15.1 % (ref 11.1–15.7)
WBC: 3.2 10*3/uL — AB (ref 3.9–10.0)

## 2015-01-14 LAB — LACTATE DEHYDROGENASE: LDH: 277 U/L — AB (ref 94–250)

## 2015-01-14 LAB — CMP (CANCER CENTER ONLY)
ALT: 58 U/L — AB (ref 10–47)
AST: 117 U/L — ABNORMAL HIGH (ref 11–38)
Albumin: 3.5 g/dL (ref 3.3–5.5)
Alkaline Phosphatase: 337 U/L — ABNORMAL HIGH (ref 26–84)
BUN, Bld: 12 mg/dL (ref 7–22)
CALCIUM: 9.2 mg/dL (ref 8.0–10.3)
CHLORIDE: 103 meq/L (ref 98–108)
CO2: 25 mEq/L (ref 18–33)
Creat: 0.7 mg/dl (ref 0.6–1.2)
Glucose, Bld: 102 mg/dL (ref 73–118)
Potassium: 3.5 mEq/L (ref 3.3–4.7)
Sodium: 138 mEq/L (ref 128–145)
Total Bilirubin: 0.8 mg/dl (ref 0.20–1.60)
Total Protein: 7 g/dL (ref 6.4–8.1)

## 2015-01-14 LAB — CANCER ANTIGEN 27.29: CA 27.29: 99 U/mL — ABNORMAL HIGH (ref 0–39)

## 2015-01-14 LAB — FERRITIN CHCC: Ferritin: 329 ng/ml — ABNORMAL HIGH (ref 9–269)

## 2015-01-14 LAB — IRON AND TIBC CHCC
%SAT: 16 % — ABNORMAL LOW (ref 21–57)
Iron: 55 ug/dL (ref 41–142)
TIBC: 344 ug/dL (ref 236–444)
UIBC: 288 ug/dL (ref 120–384)

## 2015-01-14 MED ORDER — SUMATRIPTAN SUCCINATE 100 MG PO TABS: 100.0000 mg | ORAL_TABLET | ORAL | Status: DC | PRN

## 2015-01-14 MED ORDER — PANTOPRAZOLE SODIUM 40 MG PO TBEC
40.0000 mg | DELAYED_RELEASE_TABLET | Freq: Every day | ORAL | Status: DC
Start: 2015-01-14 — End: 2015-02-12

## 2015-01-14 MED ORDER — OXYCODONE HCL 5 MG PO TABS: 5.0000 mg | ORAL_TABLET | Freq: Four times a day (QID) | ORAL | Status: DC | PRN

## 2015-01-14 MED ORDER — ESCITALOPRAM OXALATE 20 MG PO TABS: 20.0000 mg | ORAL_TABLET | Freq: Every morning | ORAL | Status: DC

## 2015-01-14 NOTE — Progress Notes (Signed)
. [facility]  Telephone:(336) [phone] Fax:(336) [phone]  ID: Mary [name] OB: [birth year] MR#: [id number]#:[id number] Patient Care Team: Biagio Borg, MD as PCP - General (Internal Medicine)  DIAGNOSIS: Metastatic breast cancer-ER positive/HER-2 positive  INTERVAL HISTORY: Mary [name] is back today for follow-up and treatment. She is feeling better. The wound to her right breast continues to heal and is almost completely closed up. The drainage is lessening and the odor is gone. She is keeping it clean and dry with a bandage over it.  She denies fever, chills, n/v, rash, cough, headaches, dizziness, SOB, chest pain, palpitations, abdominal pain, constipation, diarrhea, problems urinating, blood in urine or stool.  She denies tenderness or numbness. She still has some intermittent tingling in her fingers and toes that she describes as mild and tolerable. She is going to start taking vitamin B 12 daily. Her appetite is good and she is drinking plenty of fluids. Her weight is stable at 150 lbs.  In December, her CA 27.29 was down to 120 and LDH was 306. Recent MUGA scan showed EF 62%.  CURRENT TREATMENT: Status post 5 cycles of Cytoxan/Taxotere/Herceptin/Perjeta  Zometa 4 mg IV Q3 weeks Kadcyla/Perjeta s/p cycle 1  REVIEW OF SYSTEMS: All other 10 point review of systems is negative except for those issues mentioned above.   PAST MEDICAL HISTORY: Past Medical History  Diagnosis Date  . Arthritis   . Depression   . Fainting   . Headache   . Hypertension   . Migraines   . History of blood transfusion   . Gastric ulcer   . Breast cancer metastasized to multiple sites 04/12/2014   PAST SURGICAL HISTORY: Past Surgical History  Procedure Laterality Date  . Abdominal hysterectomy  15 years go    partial   FAMILY HISTORY Family History  Problem Relation Age of Onset  . Arthritis Other   . Hypertension Other   . Hypertension Other   . Heart disease Other   .  Stroke Other    GYNECOLOGIC HISTORY:  No LMP recorded. Patient has had a hysterectomy.   SOCIAL HISTORY:  History   Social History  . Marital Status: Married    Spouse Name: N/A    Number of Children: N/A  . Years of Education: N/A   Occupational History  .      no work for 4 to 5 years   Social History Main Topics  . Smoking status: Never Smoker   . Smokeless tobacco: Never Used     Comment: never used tobacco  . Alcohol Use: No  . Drug Use: No  . Sexual Activity: Not Currently   Other Topics Concern  . Not on file   Social History Narrative   ADVANCED DIRECTIVES: <no information>  HEALTH MAINTENANCE: History  Substance Use Topics  . Smoking status: Never Smoker   . Smokeless tobacco: Never Used     Comment: never used tobacco  . Alcohol Use: No   Colonoscopy: PAP: Bone density: Lipid panel:  Allergies  Allergen Reactions  . Hydrocodone     "makes me crawl on the floor"  . Aspirin Other (See Comments)    Due to ulcers  . Penicillins Swelling    Current Outpatient Prescriptions  Medication Sig Dispense Refill  . amLODipine (NORVASC) 5 MG tablet Take 1 tablet (5 mg total) by mouth every morning. 30 tablet 4  . Diphenhydramine-APAP, sleep, (HEADACHE RELIEF PM) 38-500 MG TABS Take 2 tablets by mouth daily as  needed (For headaches.).    Marland Kitchen escitalopram (LEXAPRO) 20 MG tablet Take 1 tablet (20 mg total) by mouth every morning. 30 tablet 4  . lidocaine-prilocaine (EMLA) cream Apply 1 application topically as needed. Apply quarter sized amount to portacath site at least one hour prior to treatment .  Cover with saran wrap 30 g 0  . lisinopril (PRINIVIL,ZESTRIL) 40 MG tablet Take 1 tablet (40 mg total) by mouth every morning. 30 tablet 4  . oxyCODONE (OXY IR/ROXICODONE) 5 MG immediate release tablet Take 1 tablet (5 mg total) by mouth every 6 (six) hours as needed for severe pain. 120 tablet 0  . pantoprazole (PROTONIX) 40 MG tablet Take 1 tablet (40 mg total) by  mouth daily. 30 tablet 2  . potassium chloride SA (K-DUR,KLOR-CON) 20 MEQ tablet Take 1 tablet (20 mEq total) by mouth 1 day or 1 dose. 30 tablet 0  . SUMAtriptan (IMITREX) 100 MG tablet Take 1 tablet (100 mg total) by mouth every 2 (two) hours as needed for migraine. 10 tablet 3   No current facility-administered medications for this visit.   OBJECTIVE: Filed Vitals:   01/14/15 0847  BP: 168/86  Pulse: 79  Temp: 98 F (36.7 C)  Resp: 14   Body mass index is 25.73 kg/(m^2). ECOG FS:0 - Asymptomatic Ocular: Sclerae unicteric, pupils equal, round and reactive to light Ear-nose-throat: Oropharynx clear, dentition fair Lymphatic: No cervical or supraclavicular adenopathy Lungs no rales or rhonchi, good excursion bilaterally Heart regular rate and rhythm, no murmur appreciated Abd soft, nontender, positive bowel sounds MSK no focal spinal tenderness, no joint edema Neuro: non-focal, well-oriented, appropriate affect Breasts: Wound is still draining a scant amount. The opening is almost closed. The odor is gone. No changes to her breasts.    LAB RESULTS: CMP     Component Value Date/Time   NA 138 01/14/2015 0836   NA 137 04/09/2014 0450   K 3.5 01/14/2015 0836   K 3.5* 04/09/2014 0450   CL 103 01/14/2015 0836   CL 101 04/09/2014 0450   CO2 25 01/14/2015 0836   CO2 23 04/09/2014 0450   GLUCOSE 102 01/14/2015 0836   GLUCOSE 98 04/09/2014 0450   BUN 12 01/14/2015 0836   BUN 5* 04/09/2014 0450   CREATININE 0.7 01/14/2015 0836   CREATININE 0.59 04/09/2014 0450   CALCIUM 9.2 01/14/2015 0836   CALCIUM 9.3 04/09/2014 0450   PROT 7.0 01/14/2015 0836   PROT 6.7 04/07/2014 0630   ALBUMIN 2.4* 04/07/2014 0630   AST 117* 01/14/2015 0836   AST 74* 04/07/2014 0630   ALT 58* 01/14/2015 0836   ALT 34 04/07/2014 0630   ALKPHOS 337* 01/14/2015 0836   ALKPHOS 179* 04/07/2014 0630   BILITOT 0.80 01/14/2015 0836   BILITOT 0.7 04/07/2014 0630   GFRNONAA >90 04/09/2014 0450   GFRAA >90  04/09/2014 0450   No results found for: SPEP Lab Results  Component Value Date   WBC 3.2* 01/14/2015   NEUTROABS 2.7 01/14/2015   HGB 11.3* 01/14/2015   HCT 34.0* 01/14/2015   MCV 97 01/14/2015   PLT 195 01/14/2015   Lab Results  Component Value Date   LABCA2 120* 12/25/2014   No components found for: [id number] No results for input(s): INR in the last 168 hours.  STUDIES: None  ASSESSMENT/PLAN: Mary [name] is 59 year old Afro-American female with metastatic breast cancer. She is doing well and is asymptomatic at this time.  Recent CT scan showed a mixed response to her previous  treatment regimen and she was switched to [place] and has had 1 cycle.  Her LFT's are elevated today. We will hold off on treating her this week. We will have her come back on next Monday for labs and re-evaluate her for treatment.   She has her current treatment and appointment schedule.  All questions were answered and she is in agreement with the plan.  She knows to call here with any questions or concerns and to go to the ED in the event of an emergency. We can certainly see her again sooner if needed.   Eliezer Bottom, NP 01/14/2015 9:16 AM

## 2015-01-14 NOTE — Progress Notes (Signed)
Pt was not treated today per Judson Roch, NP. Her lFT's were increased and she will return next week for repeat labs and further evaluation. Pt is aware and has a copy of her new schedule.

## 2015-01-15 ENCOUNTER — Ambulatory Visit: Payer: Medicaid Other

## 2015-01-21 ENCOUNTER — Ambulatory Visit: Payer: Medicaid Other

## 2015-01-21 ENCOUNTER — Other Ambulatory Visit: Payer: Medicaid Other | Admitting: Lab

## 2015-01-22 ENCOUNTER — Other Ambulatory Visit (HOSPITAL_BASED_OUTPATIENT_CLINIC_OR_DEPARTMENT_OTHER): Payer: Medicaid Other | Admitting: Lab

## 2015-01-22 ENCOUNTER — Ambulatory Visit (HOSPITAL_BASED_OUTPATIENT_CLINIC_OR_DEPARTMENT_OTHER): Payer: Medicaid Other | Admitting: Family

## 2015-01-22 ENCOUNTER — Ambulatory Visit (HOSPITAL_BASED_OUTPATIENT_CLINIC_OR_DEPARTMENT_OTHER): Payer: Medicaid Other

## 2015-01-22 VITALS — BP 166/76 | HR 72 | Temp 98.0°F | Resp 16 | Ht 64.0 in | Wt 146.0 lb

## 2015-01-22 DIAGNOSIS — C787 Secondary malignant neoplasm of liver and intrahepatic bile duct: Secondary | ICD-10-CM

## 2015-01-22 DIAGNOSIS — C7951 Secondary malignant neoplasm of bone: Secondary | ICD-10-CM

## 2015-01-22 DIAGNOSIS — Z5112 Encounter for antineoplastic immunotherapy: Secondary | ICD-10-CM

## 2015-01-22 DIAGNOSIS — C50911 Malignant neoplasm of unspecified site of right female breast: Secondary | ICD-10-CM

## 2015-01-22 DIAGNOSIS — C50919 Malignant neoplasm of unspecified site of unspecified female breast: Secondary | ICD-10-CM

## 2015-01-22 DIAGNOSIS — C799 Secondary malignant neoplasm of unspecified site: Secondary | ICD-10-CM

## 2015-01-22 DIAGNOSIS — Z170 Estrogen receptor positive status [ER+]: Secondary | ICD-10-CM

## 2015-01-22 LAB — CMP (CANCER CENTER ONLY)
ALK PHOS: 236 U/L — AB (ref 26–84)
ALT: 21 U/L (ref 10–47)
AST: 31 U/L (ref 11–38)
Albumin: 3 g/dL — ABNORMAL LOW (ref 3.3–5.5)
BUN: 7 mg/dL (ref 7–22)
CO2: 28 mEq/L (ref 18–33)
CREATININE: 0.5 mg/dL — AB (ref 0.6–1.2)
Calcium: 9 mg/dL (ref 8.0–10.3)
Chloride: 103 mEq/L (ref 98–108)
Glucose, Bld: 79 mg/dL (ref 73–118)
POTASSIUM: 3.4 meq/L (ref 3.3–4.7)
Sodium: 136 mEq/L (ref 128–145)
TOTAL PROTEIN: 6.4 g/dL (ref 6.4–8.1)
Total Bilirubin: 0.7 mg/dl (ref 0.20–1.60)

## 2015-01-22 LAB — CBC WITH DIFFERENTIAL (CANCER CENTER ONLY)
BASO#: 0 10*3/uL (ref 0.0–0.2)
BASO%: 0.7 % (ref 0.0–2.0)
EOS ABS: 0.1 10*3/uL (ref 0.0–0.5)
EOS%: 3.8 % (ref 0.0–7.0)
HEMATOCRIT: 32.6 % — AB (ref 34.8–46.6)
HEMOGLOBIN: 10.7 g/dL — AB (ref 11.6–15.9)
LYMPH#: 0.9 10*3/uL (ref 0.9–3.3)
LYMPH%: 31.8 % (ref 14.0–48.0)
MCH: 31.8 pg (ref 26.0–34.0)
MCHC: 32.8 g/dL (ref 32.0–36.0)
MCV: 97 fL (ref 81–101)
MONO#: 0.3 10*3/uL (ref 0.1–0.9)
MONO%: 11.4 % (ref 0.0–13.0)
NEUT#: 1.5 10*3/uL (ref 1.5–6.5)
NEUT%: 52.3 % (ref 39.6–80.0)
PLATELETS: 288 10*3/uL (ref 145–400)
RBC: 3.36 10*6/uL — ABNORMAL LOW (ref 3.70–5.32)
RDW: 14.7 % (ref 11.1–15.7)
WBC: 2.9 10*3/uL — ABNORMAL LOW (ref 3.9–10.0)

## 2015-01-22 MED ORDER — ACETAMINOPHEN 325 MG PO TABS
650.0000 mg | ORAL_TABLET | Freq: Once | ORAL | Status: AC
  Administered 2015-01-22: 650 mg via ORAL

## 2015-01-22 MED ORDER — SODIUM CHLORIDE 0.9 % IJ SOLN
10.0000 mL | INTRAMUSCULAR | Status: DC | PRN
  Administered 2015-01-22: 10 mL
  Filled 2015-01-22: qty 10

## 2015-01-22 MED ORDER — HEPARIN SOD (PORK) LOCK FLUSH 100 UNIT/ML IV SOLN
500.0000 [IU] | Freq: Once | INTRAVENOUS | Status: AC | PRN
  Administered 2015-01-22: 500 [IU]
  Filled 2015-01-22: qty 5

## 2015-01-22 MED ORDER — ACETAMINOPHEN 325 MG PO TABS
ORAL_TABLET | ORAL | Status: AC
  Filled 2015-01-22: qty 2

## 2015-01-22 MED ORDER — SODIUM CHLORIDE 0.9 % IV SOLN
3.6000 mg/kg | Freq: Once | INTRAVENOUS | Status: AC
  Administered 2015-01-22: 240 mg via INTRAVENOUS
  Filled 2015-01-22: qty 5

## 2015-01-22 MED ORDER — SODIUM CHLORIDE 0.9 % IV SOLN
420.0000 mg | Freq: Once | INTRAVENOUS | Status: AC
  Administered 2015-01-22: 420 mg via INTRAVENOUS
  Filled 2015-01-22: qty 14

## 2015-01-22 MED ORDER — DIPHENHYDRAMINE HCL 25 MG PO CAPS
50.0000 mg | ORAL_CAPSULE | Freq: Once | ORAL | Status: AC
  Administered 2015-01-22: 50 mg via ORAL

## 2015-01-22 MED ORDER — ZOLEDRONIC ACID 4 MG/100ML IV SOLN
4.0000 mg | Freq: Once | INTRAVENOUS | Status: AC
  Administered 2015-01-22: 4 mg via INTRAVENOUS
  Filled 2015-01-22: qty 100

## 2015-01-22 MED ORDER — DIPHENHYDRAMINE HCL 25 MG PO CAPS
ORAL_CAPSULE | ORAL | Status: AC
  Filled 2015-01-22: qty 2

## 2015-01-22 MED ORDER — SODIUM CHLORIDE 0.9 % IV SOLN
Freq: Once | INTRAVENOUS | Status: AC
  Administered 2015-01-22: 11:00:00 via INTRAVENOUS

## 2015-01-22 NOTE — Progress Notes (Signed)
. Mary [name] [name]  Telephone:(336) [phone] Fax:(336) [phone]  ID: Minus Liberty OB: 1956-02-10 MR#: [id number]#:[id number] Patient Care Team: Biagio Borg, MD as PCP - General (Internal Medicine)  DIAGNOSIS: Metastatic breast cancer-ER positive/HER-2 positive  INTERVAL HISTORY: Mary [name] [name] is back today for follow-up and treatment. She is doing well. We held off on treating her last week because her LFT's were significantly elevated. Today her alk/phos is 236, ALT 21 and AST 31.  The wound to her right breast continues to heal and is almost completely closed up. The drainage is lessening and the odor is gone. She is keeping it clean and dry with a bandage over it.  She denies fever, chills, n/v, rash, cough, headaches, dizziness, SOB, chest pain, palpitations, abdominal pain, constipation, diarrhea, problems urinating, blood in urine or stool.  She denies tenderness or numbness. She still has some intermittent tingling in her fingers and toes that she describes as mild and tolerable.  Her appetite is good and she is drinking plenty of fluids. Her weight is stable at 150 lbs.  In January, her CA 27.29 was down to 99 and LDH was 277. November MUGA scan showed EF 62%.  CURRENT TREATMENT: Status post 5 cycles of Cytoxan/Taxotere/Herceptin/Perjeta  Zometa 4 mg IV Q3 weeks Kadcyla/Perjeta s/p cycle 1  REVIEW OF SYSTEMS: All other 10 point review of systems is negative except for those issues mentioned above.   PAST MEDICAL HISTORY: Past Medical History  Diagnosis Date  . Arthritis   . Depression   . Fainting   . Headache   . Hypertension   . Migraines   . History of blood transfusion   . Gastric ulcer   . Breast cancer metastasized to multiple sites 04/12/2014   PAST SURGICAL HISTORY: Past Surgical History  Procedure Laterality Date  . Abdominal hysterectomy  15 years go    partial   FAMILY HISTORY Family History  Problem Relation Age of Onset  . Arthritis  Other   . Hypertension Other   . Hypertension Other   . Heart disease Other   . Stroke Other    GYNECOLOGIC HISTORY:  No LMP recorded. Patient has had a hysterectomy.   SOCIAL HISTORY:  History   Social History  . Marital Status: Married    Spouse Name: N/A    Number of Children: N/A  . Years of Education: N/A   Occupational History  .      no work for 4 to 5 years   Social History Main Topics  . Smoking status: Never Smoker   . Smokeless tobacco: Never Used     Comment: never used tobacco  . Alcohol Use: No  . Drug Use: No  . Sexual Activity: Not Currently   Other Topics Concern  . Not on file   Social History Narrative   ADVANCED DIRECTIVES: <no information>  HEALTH MAINTENANCE: History  Substance Use Topics  . Smoking status: Never Smoker   . Smokeless tobacco: Never Used     Comment: never used tobacco  . Alcohol Use: No   Colonoscopy: PAP: Bone density: Lipid panel:  Allergies  Allergen Reactions  . Hydrocodone     "makes me crawl on the floor"  . Aspirin Other (See Comments)    Due to ulcers  . Penicillins Swelling    Current Outpatient Prescriptions  Medication Sig Dispense Refill  . amLODipine (NORVASC) 5 MG tablet Take 1 tablet (5 mg total) by mouth every morning. 30 tablet 4  .  Diphenhydramine-APAP, sleep, (HEADACHE RELIEF PM) 38-500 MG TABS Take 2 tablets by mouth daily as needed (For headaches.).    Marland Kitchen escitalopram (LEXAPRO) 20 MG tablet Take 1 tablet (20 mg total) by mouth every morning. 30 tablet 4  . lidocaine-prilocaine (EMLA) cream Apply 1 application topically as needed. Apply quarter sized amount to portacath site at least one hour prior to treatment .  Cover with saran wrap 30 g 0  . lisinopril (PRINIVIL,ZESTRIL) 40 MG tablet Take 1 tablet (40 mg total) by mouth every morning. 30 tablet 4  . oxyCODONE (OXY IR/ROXICODONE) 5 MG immediate release tablet Take 1 tablet (5 mg total) by mouth every 6 (six) hours as needed for severe pain.  120 tablet 0  . pantoprazole (PROTONIX) 40 MG tablet Take 1 tablet (40 mg total) by mouth daily. 30 tablet 2  . potassium chloride SA (K-DUR,KLOR-CON) 20 MEQ tablet Take 1 tablet (20 mEq total) by mouth 1 day or 1 dose. 30 tablet 0  . SUMAtriptan (IMITREX) 100 MG tablet Take 1 tablet (100 mg total) by mouth every 2 (two) hours as needed for migraine. 10 tablet 3   No current facility-administered medications for this visit.   OBJECTIVE: There were no vitals filed for this visit. There is no weight on file to calculate BMI. ECOG FS:0 - Asymptomatic Ocular: Sclerae unicteric, pupils equal, round and reactive to light Ear-nose-throat: Oropharynx clear, dentition fair Lymphatic: No cervical or supraclavicular adenopathy Lungs no rales or rhonchi, good excursion bilaterally Heart regular rate and rhythm, no murmur appreciated Abd soft, nontender, positive bowel sounds MSK no focal spinal tenderness, no joint edema Neuro: non-focal, well-oriented, appropriate affect Breasts: Wound is still draining a scant amount. The opening is almost closed. The odor is gone. No changes to her breasts.    LAB RESULTS: CMP     Component Value Date/Time   NA 138 01/14/2015 0836   NA 137 04/09/2014 0450   K 3.5 01/14/2015 0836   K 3.5* 04/09/2014 0450   CL 103 01/14/2015 0836   CL 101 04/09/2014 0450   CO2 25 01/14/2015 0836   CO2 23 04/09/2014 0450   GLUCOSE 102 01/14/2015 0836   GLUCOSE 98 04/09/2014 0450   BUN 12 01/14/2015 0836   BUN 5* 04/09/2014 0450   CREATININE 0.7 01/14/2015 0836   CREATININE 0.59 04/09/2014 0450   CALCIUM 9.2 01/14/2015 0836   CALCIUM 9.3 04/09/2014 0450   PROT 7.0 01/14/2015 0836   PROT 6.7 04/07/2014 0630   ALBUMIN 2.4* 04/07/2014 0630   AST 117* 01/14/2015 0836   AST 74* 04/07/2014 0630   ALT 58* 01/14/2015 0836   ALT 34 04/07/2014 0630   ALKPHOS 337* 01/14/2015 0836   ALKPHOS 179* 04/07/2014 0630   BILITOT 0.80 01/14/2015 0836   BILITOT 0.7 04/07/2014 0630    GFRNONAA >90 04/09/2014 0450   GFRAA >90 04/09/2014 0450   No results found for: SPEP Lab Results  Component Value Date   WBC 3.2* 01/14/2015   NEUTROABS 2.7 01/14/2015   HGB 11.3* 01/14/2015   HCT 34.0* 01/14/2015   MCV 97 01/14/2015   PLT 195 01/14/2015   Lab Results  Component Value Date   LABCA2 99* 01/14/2015   No components found for: [id number] No results for input(s): INR in the last 168 hours.  STUDIES: None  ASSESSMENT/PLAN: Ms. Ouk is 59 year old Afro-American female with metastatic breast cancer. She is doing well and is asymptomatic at this time.  Recent CT scan showed a mixed response  to her previous treatment regimen and she was switched to [place] and has had 1 cycle.  Her LFT's are improved today so we will proceed with cycle 2. She has her current treatment and appointment schedule.  All questions were answered and she is in agreement with the plan.  She knows to call here with any questions or concerns and to go to the ED in the event of an emergency. We can certainly see her again sooner if needed.   Eliezer Bottom, NP 01/22/2015 9:26 AM

## 2015-01-22 NOTE — Patient Instructions (Signed)
Zoledronic Acid injection (Hypercalcemia, Oncology) What is this medicine? ZOLEDRONIC ACID (ZOE le dron ik AS id) lowers the amount of calcium loss from bone. It is used to treat too much calcium in your blood from cancer. It is also used to prevent complications of cancer that has spread to the bone. This medicine may be used for other purposes; ask your health care provider or pharmacist if you have questions. COMMON BRAND NAME(S): Zometa What should I tell my health care provider before I take this medicine? They need to know if you have any of these conditions: -aspirin-sensitive asthma -cancer, especially if you are receiving medicines used to treat cancer -dental disease or wear dentures -infection -kidney disease -receiving corticosteroids like dexamethasone or prednisone -an unusual or allergic reaction to zoledronic acid, other medicines, foods, dyes, or preservatives -pregnant or trying to get pregnant -breast-feeding How should I use this medicine? This medicine is for infusion into a vein. It is given by a health care professional in a hospital or clinic setting. Talk to your pediatrician regarding the use of this medicine in children. Special care may be needed. Overdosage: If you think you have taken too much of this medicine contact a poison control center or emergency room at once. NOTE: This medicine is only for you. Do not share this medicine with others. What if I miss a dose? It is important not to miss your dose. Call your doctor or health care professional if you are unable to keep an appointment. What may interact with this medicine? -certain antibiotics given by injection -NSAIDs, medicines for pain and inflammation, like ibuprofen or naproxen -some diuretics like bumetanide, furosemide -teriparatide -thalidomide This list may not describe all possible interactions. Give your health care provider a list of all the medicines, herbs, non-prescription drugs, or  dietary supplements you use. Also tell them if you smoke, drink alcohol, or use illegal drugs. Some items may interact with your medicine. What should I watch for while using this medicine? Visit your doctor or health care professional for regular checkups. It may be some time before you see the benefit from this medicine. Do not stop taking your medicine unless your doctor tells you to. Your doctor may order blood tests or other tests to see how you are doing. Women should inform their doctor if they wish to become pregnant or think they might be pregnant. There is a potential for serious side effects to an unborn child. Talk to your health care professional or pharmacist for more information. You should make sure that you get enough calcium and vitamin D while you are taking this medicine. Discuss the foods you eat and the vitamins you take with your health care professional. Some people who take this medicine have severe bone, joint, and/or muscle pain. This medicine may also increase your risk for jaw problems or a broken thigh bone. Tell your doctor right away if you have severe pain in your jaw, bones, joints, or muscles. Tell your doctor if you have any pain that does not go away or that gets worse. Tell your dentist and dental surgeon that you are taking this medicine. You should not have major dental surgery while on this medicine. See your dentist to have a dental exam and fix any dental problems before starting this medicine. Take good care of your teeth while on this medicine. Make sure you see your dentist for regular follow-up appointments. What side effects may I notice from receiving this medicine? Side effects that   you should report to your doctor or health care professional as soon as possible: -allergic reactions like skin rash, itching or hives, swelling of the face, lips, or tongue -anxiety, confusion, or depression -breathing problems -changes in vision -eye pain -feeling faint or  lightheaded, falls -jaw pain, especially after dental work -mouth sores -muscle cramps, stiffness, or weakness -trouble passing urine or change in the amount of urine Side effects that usually do not require medical attention (report to your doctor or health care professional if they continue or are bothersome): -bone, joint, or muscle pain -constipation -diarrhea -fever -hair loss -irritation at site where injected -loss of appetite -nausea, vomiting -stomach upset -trouble sleeping -trouble swallowing -weak or tired This list may not describe all possible side effects. Call your doctor for medical advice about side effects. You may report side effects to FDA at 1-800-FDA-1088. Where should I keep my medicine? This drug is given in a hospital or clinic and will not be stored at home. NOTE: This sheet is a summary. It may not cover all possible information. If you have questions about this medicine, talk to your doctor, pharmacist, or health care provider.  2015, Elsevier/Gold Standard. (2013-05-25 13:03:13) Ado-Trastuzumab Emtansine for injection What is this medicine? Ado-trastuzumab emtansine (ADD oh traz TOO zuh mab em TAN zine) is a monoclonal antibody combined with chemotherapy. It targets a protein called HER2. This protein is found in some stomach and breast cancers. This medicine can stop cancer cell growth. This medicine may be used for other purposes; ask your health care provider or pharmacist if you have questions. COMMON BRAND NAME(S): Kadcyla What should I tell my health care provider before I take this medicine? They need to know if you have any of these conditions: -heart disease -heart failure -infection (especially a virus infection such as chickenpox, cold sores, or herpes) -liver disease -lung or breathing disease, like asthma -an unusual or allergic reaction to ado-trastuzumab emtansine, other medications, foods, dyes, or preservatives -pregnant or trying to  get pregnant -breast-feeding How should I use this medicine? This medicine is for infusion into a vein. It is given by a health care professional in a hospital or clinic setting. Talk to your pediatrician regarding the use of this medicine in children. Special care may be needed. Overdosage: If you think you've taken too much of this medicine contact a poison control center or emergency room at once. Overdosage: If you think you have taken too much of this medicine contact a poison control center or emergency room at once. NOTE: This medicine is only for you. Do not share this medicine with others. What if I miss a dose? It is important not to miss your dose. Call your doctor or health care professional if you are unable to keep an appointment. What may interact with this medicine? This medicine may also interact with the following medications: -atazanavir -boceprevir -clarithromycin -delavirdine -indinavir -dalfopristin; quinupristin -isoniazid, INH -itraconazole -ketoconazole -nefazodone -nelfinavir -ritonavir -telaprevir -telithromycin -tipranavir -voriconazole This list may not describe all possible interactions. Give your health care provider a list of all the medicines, herbs, non-prescription drugs, or dietary supplements you use. Also tell them if you smoke, drink alcohol, or use illegal drugs. Some items may interact with your medicine. What should I watch for while using this medicine? Visit your doctor for checks on your progress. This drug may make you feel generally unwell. This is not uncommon, as chemotherapy can affect healthy cells as well as cancer cells. Report any side  effects. Continue your course of treatment even though you feel ill unless your doctor tells you to stop. Call your doctor or health care professional for advice if you get a fever, chills or sore throat, or other symptoms of a cold or flu. Do not treat yourself. This drug decreases your body's  ability to fight infections. Try to avoid being around people who are sick. Be careful brushing and flossing your teeth or using a toothpick because you may get an infection or bleed more easily. If you have any dental work done, tell your dentist you are receiving this medicine. Avoid taking products that contain aspirin, acetaminophen, ibuprofen, naproxen, or ketoprofen unless instructed by your doctor. These medicines may hide a fever. Do not become pregnant while taking this medicine. Women should inform their doctor if they wish to become pregnant or think they might be pregnant. There is a potential for serious side effects to an unborn child. [Men should inform their doctors if they wish to father a child. This medicine may lower sperm counts.] Talk to your health care professional or pharmacist for more information. Do not breast-feed an infant while taking this medicine. You may need blood work done while you are taking this medicine. What side effects may I notice from receiving this medicine? Side effects that you should report to your doctor or health care professional as soon as possible: -allergic reactions like skin rash, itching or hives, swelling of the face, lips, or tongue -breathing problems -chest pain or palpitations -fever or chills, sore throat -general ill feeling or flu-like symptoms -light-colored stools -nausea, vomiting -pain, tingling, numbness in the hands or feet -signs and symptoms of bleeding such as bloody or black, tarry stools; red or dark-brown urine; spitting up blood or brown material that looks like coffee grounds; red spots on the skin; unusual bruising or bleeding from the eye, gums, or nose -swelling of the legs or ankles -yellowing of the eyes or skin Side effects that usually do not require medical attention (Report these to your doctor or health care professional if they continue or are bothersome.): -changes in  taste -constipation -dizziness -headache -joint pain -muscle pain -trouble sleeping -unusually weak or tired This list may not describe all possible side effects. Call your doctor for medical advice about side effects. You may report side effects to FDA at 1-800-FDA-1088. Where should I keep my medicine? This drug is given in a hospital or clinic and will not be stored at home. NOTE: This sheet is a summary. It may not cover all possible information. If you have questions about this medicine, talk to your doctor, pharmacist, or health care provider.  2015, [employer] Standard. (2013-04-25 12:55:10) Pertuzumab injection What is this medicine? PERTUZUMAB (per TOOZ ue mab) is a monoclonal antibody that targets a protein called HER2. HER2 is found in some breast cancers. This medicine can stop cancer cell growth. This medicine is used with other cancer treatments. This medicine may be used for other purposes; ask your health care provider or pharmacist if you have questions. COMMON BRAND NAME(S): PERJETA What should I tell my health care provider before I take this medicine? They need to know if you have any of these conditions: -heart disease -heart failure -high blood pressure -history of irregular heart beat -recent or ongoing radiation therapy -an unusual or allergic reaction to pertuzumab, other medicines, foods, dyes, or preservatives -pregnant or trying to get pregnant -breast-feeding How should I use this medicine? This medicine is for infusion  into a vein. It is given by a health care professional in a hospital or clinic setting. Talk to your pediatrician regarding the use of this medicine in children. Special care may be needed. Overdosage: If you think you've taken too much of this medicine contact a poison control center or emergency room at once. Overdosage: If you think you have taken too much of this medicine contact a poison control center or emergency room at  once. NOTE: This medicine is only for you. Do not share this medicine with others. What if I miss a dose? It is important not to miss your dose. Call your doctor or health care professional if you are unable to keep an appointment. What may interact with this medicine? Interactions are not expected. Give your health care provider a list of all the medicines, herbs, non-prescription drugs, or dietary supplements you use. Also tell them if you smoke, drink alcohol, or use illegal drugs. Some items may interact with your medicine. This list may not describe all possible interactions. Give your health care provider a list of all the medicines, herbs, non-prescription drugs, or dietary supplements you use. Also tell them if you smoke, drink alcohol, or use illegal drugs. Some items may interact with your medicine. What should I watch for while using this medicine? Your condition will be monitored carefully while you are receiving this medicine. Report any side effects. Continue your course of treatment even though you feel ill unless your doctor tells you to stop. Do not become pregnant while taking this medicine. Women should inform their doctor if they wish to become pregnant or think they might be pregnant. There is a potential for serious side effects to an unborn child. Talk to your health care professional or pharmacist for more information. Do not breast-feed an infant while taking this medicine. Call your doctor or health care professional for advice if you get a fever, chills or sore throat, or other symptoms of a cold or flu. Do not treat yourself. Try to avoid being around people who are sick. You may experience fever, chills, and headache during the infusion. Report any side effects during the infusion to your health care professional. What side effects may I notice from receiving this medicine? Side effects that you should report to your doctor or health care professional as soon as  possible: -breathing problems -chest pain or palpitations -dizziness -feeling faint or lightheaded -fever or chills -skin rash, itching or hives -sore throat -swelling of the face, lips, or tongue -swelling of the legs or ankles -unusually weak or tired Side effects that usually do not require medical attention (Report these to your doctor or health care professional if they continue or are bothersome.): -diarrhea -hair loss -nausea, vomiting -tiredness This list may not describe all possible side effects. Call your doctor for medical advice about side effects. You may report side effects to FDA at 1-800-FDA-1088. Where should I keep my medicine? This drug is given in a hospital or clinic and will not be stored at home. NOTE: This sheet is a summary. It may not cover all possible information. If you have questions about this medicine, talk to your doctor, pharmacist, or health care provider.  2015, Elsevier/Gold Standard. (2012-10-12 16:54:15)

## 2015-01-22 NOTE — Progress Notes (Signed)
Per [name] ok to treat despite labs.

## 2015-01-22 NOTE — Progress Notes (Signed)
No recent fall

## 2015-01-30 ENCOUNTER — Telehealth: Payer: Self-pay | Admitting: Hematology & Oncology

## 2015-01-30 NOTE — Telephone Encounter (Signed)
Pt aware 2-9 moved to 2-16

## 2015-02-05 ENCOUNTER — Ambulatory Visit: Payer: Medicaid Other

## 2015-02-05 ENCOUNTER — Ambulatory Visit: Payer: Medicaid Other | Admitting: Hematology & Oncology

## 2015-02-05 ENCOUNTER — Other Ambulatory Visit: Payer: Medicaid Other | Admitting: Lab

## 2015-02-12 ENCOUNTER — Ambulatory Visit: Payer: Medicaid Other

## 2015-02-12 ENCOUNTER — Ambulatory Visit (HOSPITAL_BASED_OUTPATIENT_CLINIC_OR_DEPARTMENT_OTHER): Payer: Medicaid Other | Admitting: Hematology & Oncology

## 2015-02-12 ENCOUNTER — Encounter: Payer: Self-pay | Admitting: Hematology & Oncology

## 2015-02-12 ENCOUNTER — Other Ambulatory Visit: Payer: Self-pay | Admitting: *Deleted

## 2015-02-12 ENCOUNTER — Other Ambulatory Visit (HOSPITAL_BASED_OUTPATIENT_CLINIC_OR_DEPARTMENT_OTHER): Payer: Medicaid Other | Admitting: Lab

## 2015-02-12 VITALS — BP 167/97 | HR 86 | Temp 98.2°F | Resp 16 | Ht 64.0 in | Wt 145.0 lb

## 2015-02-12 DIAGNOSIS — C787 Secondary malignant neoplasm of liver and intrahepatic bile duct: Secondary | ICD-10-CM

## 2015-02-12 DIAGNOSIS — R7989 Other specified abnormal findings of blood chemistry: Secondary | ICD-10-CM

## 2015-02-12 DIAGNOSIS — C7951 Secondary malignant neoplasm of bone: Secondary | ICD-10-CM

## 2015-02-12 DIAGNOSIS — C50911 Malignant neoplasm of unspecified site of right female breast: Secondary | ICD-10-CM

## 2015-02-12 DIAGNOSIS — D509 Iron deficiency anemia, unspecified: Secondary | ICD-10-CM

## 2015-02-12 LAB — CBC WITH DIFFERENTIAL (CANCER CENTER ONLY)
BASO#: 0 10*3/uL (ref 0.0–0.2)
BASO%: 0.4 % (ref 0.0–2.0)
EOS%: 0 % (ref 0.0–7.0)
Eosinophils Absolute: 0 10*3/uL (ref 0.0–0.5)
HCT: 36.1 % (ref 34.8–46.6)
HGB: 11.9 g/dL (ref 11.6–15.9)
LYMPH#: 0.4 10*3/uL — AB (ref 0.9–3.3)
LYMPH%: 17.8 % (ref 14.0–48.0)
MCH: 31.6 pg (ref 26.0–34.0)
MCHC: 33 g/dL (ref 32.0–36.0)
MCV: 96 fL (ref 81–101)
MONO#: 0 10*3/uL — AB (ref 0.1–0.9)
MONO%: 1.6 % (ref 0.0–13.0)
NEUT%: 80.2 % — AB (ref 39.6–80.0)
NEUTROS ABS: 2 10*3/uL (ref 1.5–6.5)
PLATELETS: 164 10*3/uL (ref 145–400)
RBC: 3.76 10*6/uL (ref 3.70–5.32)
RDW: 15 % (ref 11.1–15.7)
WBC: 2.5 10*3/uL — ABNORMAL LOW (ref 3.9–10.0)

## 2015-02-12 LAB — CMP (CANCER CENTER ONLY)
ALBUMIN: 3.4 g/dL (ref 3.3–5.5)
ALT: 53 U/L — AB (ref 10–47)
AST: 88 U/L — AB (ref 11–38)
Alkaline Phosphatase: 307 U/L — ABNORMAL HIGH (ref 26–84)
BUN, Bld: 11 mg/dL (ref 7–22)
CHLORIDE: 101 meq/L (ref 98–108)
CO2: 24 mEq/L (ref 18–33)
Calcium: 9.2 mg/dL (ref 8.0–10.3)
Creat: 0.5 mg/dl — ABNORMAL LOW (ref 0.6–1.2)
Glucose, Bld: 165 mg/dL — ABNORMAL HIGH (ref 73–118)
Potassium: 3.4 mEq/L (ref 3.3–4.7)
SODIUM: 139 meq/L (ref 128–145)
Total Bilirubin: 0.9 mg/dl (ref 0.20–1.60)
Total Protein: 7.3 g/dL (ref 6.4–8.1)

## 2015-02-12 MED ORDER — PANTOPRAZOLE SODIUM 40 MG PO TBEC: 40.0000 mg | DELAYED_RELEASE_TABLET | Freq: Every day | ORAL | Status: DC

## 2015-02-12 MED ORDER — OXYCODONE HCL 5 MG PO TABS
ORAL_TABLET | ORAL | Status: DC
Start: 2015-02-12 — End: 2015-03-07

## 2015-02-12 MED ORDER — LISINOPRIL 40 MG PO TABS: 40.0000 mg | ORAL_TABLET | Freq: Every morning | ORAL | Status: DC

## 2015-02-12 MED ORDER — ESCITALOPRAM OXALATE 20 MG PO TABS: 20.0000 mg | ORAL_TABLET | Freq: Every morning | ORAL | Status: DC

## 2015-02-12 MED ORDER — AMLODIPINE BESYLATE 5 MG PO TABS: 5.0000 mg | ORAL_TABLET | Freq: Every morning | ORAL | Status: DC

## 2015-02-12 NOTE — Progress Notes (Signed)
Hematology and Oncology Follow Up Visit  Mary [name] [id number] Nov 01, 1956 59 y.o. 02/12/2015   Principle Diagnosis:   Metastatic breast cancer-triple positive  Current Therapy:   Status post 2 cycles of Kadcyla -to stop secondary to hepatic toxicity Zometa 4 mg IV every month    Interim History:  [name] is back for follow-up. We have had to hold her Kadcyla. She had elevated liver function test. They came back to normal but then I do her next dose of Kadcyla, it went back up. As such, I really think that we are going to have to switch treatment on her.  It is hard to say how much the Kadcyla is helping. Her last CA 27.29 was 99. This was back in January.  She feels okay. She has a pain which is fairly well controlled just with oxycodone.  She's had no bleeding. She does have the site on the right upper breast/axilla which was open which is closing somewhat.  Her last scans were done back in December. I think we probably need to get another set of scans on her.  She's not having any possible bowels or bladder. She's not having any leg swelling. There is no arm swelling.  There is no fever.  She's had no mouth sores. There's been no cough. She's had no shortness of breath.  Overall, her performance status is ECOG 1.  Medications:  Current outpatient prescriptions:  .  lidocaine-prilocaine (EMLA) cream, Apply 1 application topically as needed. Apply quarter sized amount to portacath site at least one hour prior to treatment .  Cover with saran wrap, Disp: 30 g, Rfl: 0 .  oxyCODONE (OXY IR/ROXICODONE) 5 MG immediate release tablet, Take 1-2 if needed for pain due to cancer., Disp: 120 tablet, Rfl: 0 .  SUMAtriptan (IMITREX) 100 MG tablet, Take 1 tablet (100 mg total) by mouth every 2 (two) hours as needed for migraine., Disp: 10 tablet, Rfl: 3 .  amLODipine (NORVASC) 5 MG tablet, Take 1 tablet (5 mg total) by mouth every morning., Disp: 30 tablet, Rfl: 4 .   Diphenhydramine-APAP, sleep, (HEADACHE RELIEF PM) 38-500 MG TABS, Take 2 tablets by mouth daily as needed (For headaches.). Not taking--does not help, Disp: , Rfl:  .  escitalopram (LEXAPRO) 20 MG tablet, Take 1 tablet (20 mg total) by mouth every morning., Disp: 30 tablet, Rfl: 4 .  lisinopril (PRINIVIL,ZESTRIL) 40 MG tablet, Take 1 tablet (40 mg total) by mouth every morning., Disp: 30 tablet, Rfl: 4 .  pantoprazole (PROTONIX) 40 MG tablet, Take 1 tablet (40 mg total) by mouth daily., Disp: 30 tablet, Rfl: 4 .  potassium chloride SA (K-DUR,KLOR-CON) 20 MEQ tablet, Take 1 tablet (20 mEq total) by mouth 1 day or 1 dose. (Patient not taking: Reported on 02/12/2015), Disp: 30 tablet, Rfl: 0  Allergies:  Allergies  Allergen Reactions  . Hydrocodone     "makes me crawl on the floor"  . Aspirin Other (See Comments)    Due to ulcers  . Penicillins Swelling    Past Medical History, Surgical history, Social history, and Family History were reviewed and updated.  Review of Systems: As above  Physical Exam:  height is 5\' 4"  (1.626 m) and weight is 145 lb (65.772 kg). Her oral temperature is 98.2 F (36.8 C). Her blood pressure is 167/97 and her pulse is 86. Her respiration is 16.   Fairly well-built and well-nourished African-American female. She has no ocular or oral lesions. There is no  palpable cervical or supraclavicular lymph nodes. Lungs are clear. Cardiac exam regular rate and rhythm with no murmurs, rubs or bruits. Breast exam shows some moderate swelling of the right breast. She still has some firmness about the areola. She does have some palpable right axillary lymph nodes. She has a dressing over her wound site in the right axilla. Left breast is unremarkable remarkable. Abdomen is soft. She has good bowel sounds. There is no fluid wave. There is no palpable liver or spleen tip. Extremity shows no clubbing, cyanosis or edema. Neurological exam shows no focal neurological deficits. Skin exam  shows no rashes, ecchymoses or petechia.  Lab Results  Component Value Date   WBC 2.5* 02/12/2015   HGB 11.9 02/12/2015   HCT 36.1 02/12/2015   MCV 96 02/12/2015   PLT 164 02/12/2015     Chemistry      Component Value Date/Time   NA 139 02/12/2015 0909   NA 137 04/09/2014 0450   K 3.4 02/12/2015 0909   K 3.5* 04/09/2014 0450   CL 101 02/12/2015 0909   CL 101 04/09/2014 0450   CO2 24 02/12/2015 0909   CO2 23 04/09/2014 0450   BUN 11 02/12/2015 0909   BUN 5* 04/09/2014 0450   CREATININE 0.5* 02/12/2015 0909   CREATININE 0.59 04/09/2014 0450      Component Value Date/Time   CALCIUM 9.2 02/12/2015 0909   CALCIUM 9.3 04/09/2014 0450   ALKPHOS 307* 02/12/2015 0909   ALKPHOS 179* 04/07/2014 0630   AST 88* 02/12/2015 0909   AST 74* 04/07/2014 0630   ALT 53* 02/12/2015 0909   ALT 34 04/07/2014 0630   BILITOT 0.90 02/12/2015 0909   BILITOT 0.7 04/07/2014 0630         Impression and Plan: Mary [name] is 59 year old African American female. She is postmenopausal. She is a triple positive metastatic breast cancer.  Again, I think that her liver function tests are reflective of the Kadcyla. As such, I believe that we are going to have to change her treatments on her.  I think we have to go back to systemic chemotherapy. She did good with this. I think that using carboplatin/gemcitabine would not be a bad idea. I will continue her with the Herceptin and Perjeta.  I talked her about this. I explained why we had to make a change. She does understand quite well. She agrees.  I told her about side effects. She still has not regained her hair back. She may have lower blood counts. We will have to watch her blood counts closely. There may be some mouth sores. There may be some nausea. Her appetite may go down.  I do want to get another CAT scan on her. We will set this up within a week.  I want to try to get started with her next course of therapy sometime next week.  I spent  about 45 minutes with her.  I will plan to get her back to be seen for her second cycle of treatment. This will be in 3 weeks.   Volanda Napoleon, MD 2/16/201612:22 PM

## 2015-02-12 NOTE — Progress Notes (Signed)
No treatment today per dr. ennever 

## 2015-02-13 LAB — CANCER ANTIGEN 27.29: CA 27.29: 67 U/mL — ABNORMAL HIGH (ref 0–39)

## 2015-02-18 ENCOUNTER — Ambulatory Visit (HOSPITAL_BASED_OUTPATIENT_CLINIC_OR_DEPARTMENT_OTHER)
Admission: RE | Admit: 2015-02-18 | Discharge: 2015-02-18 | Disposition: A | Discharge status: Home / Self Care | Payer: Medicaid Other | Source: Ambulatory Visit | Attending: Hematology & Oncology | Admitting: Hematology & Oncology

## 2015-02-18 ENCOUNTER — Encounter (HOSPITAL_BASED_OUTPATIENT_CLINIC_OR_DEPARTMENT_OTHER): Payer: Self-pay

## 2015-02-18 DIAGNOSIS — Z90710 Acquired absence of both cervix and uterus: Secondary | ICD-10-CM | POA: Insufficient documentation

## 2015-02-18 DIAGNOSIS — Z9221 Personal history of antineoplastic chemotherapy: Secondary | ICD-10-CM | POA: Diagnosis not present

## 2015-02-18 DIAGNOSIS — C799 Secondary malignant neoplasm of unspecified site: Secondary | ICD-10-CM | POA: Insufficient documentation

## 2015-02-18 DIAGNOSIS — C50911 Malignant neoplasm of unspecified site of right female breast: Secondary | ICD-10-CM

## 2015-02-18 MED ORDER — IOHEXOL 300 MG/ML  SOLN
100.0000 mL | Freq: Once | INTRAMUSCULAR | Status: AC | PRN
  Administered 2015-02-18: 100 mL via INTRAVENOUS

## 2015-02-19 ENCOUNTER — Encounter (HOSPITAL_COMMUNITY): Payer: Self-pay

## 2015-02-19 ENCOUNTER — Emergency Department (HOSPITAL_COMMUNITY): Payer: Medicaid Other

## 2015-02-19 ENCOUNTER — Emergency Department (HOSPITAL_COMMUNITY)
Admission: EM | Admit: 2015-02-19 | Discharge: 2015-02-19 | Disposition: A | Discharge status: Home / Self Care | Payer: Medicaid Other | Attending: Emergency Medicine | Admitting: Emergency Medicine

## 2015-02-19 ENCOUNTER — Telehealth: Payer: Self-pay | Admitting: Family

## 2015-02-19 DIAGNOSIS — Y998 Other external cause status: Secondary | ICD-10-CM | POA: Diagnosis not present

## 2015-02-19 DIAGNOSIS — I10 Essential (primary) hypertension: Secondary | ICD-10-CM | POA: Diagnosis not present

## 2015-02-19 DIAGNOSIS — Z853 Personal history of malignant neoplasm of breast: Secondary | ICD-10-CM | POA: Insufficient documentation

## 2015-02-19 DIAGNOSIS — G43909 Migraine, unspecified, not intractable, without status migrainosus: Secondary | ICD-10-CM | POA: Diagnosis not present

## 2015-02-19 DIAGNOSIS — S01111A Laceration without foreign body of right eyelid and periocular area, initial encounter: Secondary | ICD-10-CM | POA: Diagnosis not present

## 2015-02-19 DIAGNOSIS — Z7952 Long term (current) use of systemic steroids: Secondary | ICD-10-CM | POA: Insufficient documentation

## 2015-02-19 DIAGNOSIS — F329 Major depressive disorder, single episode, unspecified: Secondary | ICD-10-CM | POA: Insufficient documentation

## 2015-02-19 DIAGNOSIS — W1811XA Fall from or off toilet without subsequent striking against object, initial encounter: Secondary | ICD-10-CM | POA: Diagnosis not present

## 2015-02-19 DIAGNOSIS — S0003XA Contusion of scalp, initial encounter: Secondary | ICD-10-CM

## 2015-02-19 DIAGNOSIS — K259 Gastric ulcer, unspecified as acute or chronic, without hemorrhage or perforation: Secondary | ICD-10-CM | POA: Insufficient documentation

## 2015-02-19 DIAGNOSIS — Y92002 Bathroom of unspecified non-institutional (private) residence single-family (private) house as the place of occurrence of the external cause: Secondary | ICD-10-CM | POA: Insufficient documentation

## 2015-02-19 DIAGNOSIS — Z79899 Other long term (current) drug therapy: Secondary | ICD-10-CM | POA: Insufficient documentation

## 2015-02-19 DIAGNOSIS — Z880 Allergy status to penicillin: Secondary | ICD-10-CM | POA: Diagnosis not present

## 2015-02-19 DIAGNOSIS — S0990XA Unspecified injury of head, initial encounter: Secondary | ICD-10-CM | POA: Diagnosis present

## 2015-02-19 DIAGNOSIS — Y9389 Activity, other specified: Secondary | ICD-10-CM | POA: Diagnosis not present

## 2015-02-19 DIAGNOSIS — M1990 Unspecified osteoarthritis, unspecified site: Secondary | ICD-10-CM | POA: Insufficient documentation

## 2015-02-19 MED ORDER — DEXAMETHASONE 4 MG PO TABS: 4.0000 mg | ORAL_TABLET | Freq: Two times a day (BID) | ORAL | Status: DC

## 2015-02-19 MED ORDER — ACETAMINOPHEN 325 MG PO TABS
650.0000 mg | ORAL_TABLET | Freq: Once | ORAL | Status: AC
  Administered 2015-02-19: 650 mg via ORAL
  Filled 2015-02-19: qty 2

## 2015-02-19 NOTE — ED Provider Notes (Signed)
CSN: [id number]     Arrival date & time 02/19/15  1851 History   First MD Initiated Contact with Patient 02/19/15 1856     Chief Complaint  Patient presents with  . Fall  . Head Injury     (Consider location/radiation/quality/duration/timing/severity/associated sxs/prior Treatment) HPI Comments: 59 year old female with history of breast cancer, no recent chemotherapy, high blood pressure presents after falling off of the toilet. Patient was standing on top of the toilet change in light ball lost her balance and fell and hit her for head on the faucet. Patient recalls events denies loss of consciousness, no chest pain or short of breath. Patient feels at baseline except for injuries to the face. Mild abrasion left tibia no significant pain. Pain with palpation and swelling. No vision loss or pain within the orbit itself.  Patient is a 59 y.o. female presenting with fall and head injury. The history is provided by the patient.  Fall This is a new problem. Associated symptoms include headaches. Pertinent negatives include no chest pain, no abdominal pain and no shortness of breath.  Head Injury Associated symptoms: headache   Associated symptoms: no neck pain and no vomiting     Past Medical History  Diagnosis Date  . Arthritis   . Depression   . Fainting   . Headache   . Hypertension   . Migraines   . History of blood transfusion   . Gastric ulcer   . Breast cancer metastasized to multiple sites 04/12/2014   Past Surgical History  Procedure Laterality Date  . Abdominal hysterectomy  15 years go    partial   Family History  Problem Relation Age of Onset  . Arthritis Other   . Hypertension Other   . Hypertension Other   . Heart disease Other   . Stroke Other    History  Substance Use Topics  . Smoking status: Never Smoker   . Smokeless tobacco: Never Used     Comment: never used tobacco  . Alcohol Use: No   OB History    No data available     Review of Systems   Constitutional: Negative for fever and chills.  HENT: Negative for congestion.   Eyes: Negative for visual disturbance.  Respiratory: Negative for shortness of breath.   Cardiovascular: Negative for chest pain.  Gastrointestinal: Negative for vomiting and abdominal pain.  Genitourinary: Negative for dysuria and flank pain.  Musculoskeletal: Negative for back pain, neck pain and neck stiffness.  Skin: Positive for wound. Negative for rash.  Neurological: Positive for headaches. Negative for syncope, weakness and light-headedness.      Allergies  Hydrocodone; Aspirin; and Penicillins  Home Medications   Prior to Admission medications   Medication Sig Start Date End Date Taking? Authorizing Provider  acidophilus (RISAQUAD) CAPS capsule Take 1 capsule by mouth daily.   Yes Historical Provider, MD  amLODipine (NORVASC) 5 MG tablet Take 1 tablet (5 mg total) by mouth every morning. 02/12/15  Yes Volanda Napoleon, MD  Cyanocobalamin (VITAMIN B-12 PO) Take by mouth.   Yes Historical Provider, MD  Diphenhydramine-APAP, sleep, (HEADACHE RELIEF PM) 38-500 MG TABS Take 2 tablets by mouth daily as needed (For headaches.).    Yes Historical Provider, MD  escitalopram (LEXAPRO) 20 MG tablet Take 1 tablet (20 mg total) by mouth every morning. 02/12/15  Yes Volanda Napoleon, MD  lidocaine-prilocaine (EMLA) cream Apply 1 application topically as needed. Apply quarter sized amount to portacath site at least one hour prior  to treatment .  Cover with saran wrap 05/28/14  Yes Volanda Napoleon, MD  lisinopril (PRINIVIL,ZESTRIL) 40 MG tablet Take 1 tablet (40 mg total) by mouth every morning. 02/12/15  Yes Volanda Napoleon, MD  Multiple Vitamin (MULTIVITAMIN WITH MINERALS) TABS tablet Take 1 tablet by mouth daily.   Yes Historical Provider, MD  oxyCODONE (OXY IR/ROXICODONE) 5 MG immediate release tablet Take 1-2 if needed for pain due to cancer. 02/12/15  Yes Volanda Napoleon, MD  pantoprazole (PROTONIX) 40 MG tablet  Take 1 tablet (40 mg total) by mouth daily. 02/12/15  Yes Volanda Napoleon, MD  PRESCRIPTION MEDICATION [facility] 01/22/2015.   Yes Historical Provider, MD  SUMAtriptan (IMITREX) 100 MG tablet Take 1 tablet (100 mg total) by mouth every 2 (two) hours as needed for migraine. 01/14/15  Yes Eliezer Bottom, NP  dexamethasone (DECADRON) 4 MG tablet Take 1 tablet (4 mg total) by mouth 2 (two) times daily. 02/19/15   Mariea Clonts, MD  potassium chloride SA (K-DUR,KLOR-CON) 20 MEQ tablet Take 1 tablet (20 mEq total) by mouth 1 day or 1 dose. Patient not taking: Reported on 02/12/2015 11/26/14   Poole, NP   BP 164/83 mmHg  Pulse 67  Temp(Src) 98.1 F (36.7 C) (Oral)  Resp 16  SpO2 99% Physical Exam  Constitutional: She is oriented to person, place, and time. She appears well-developed and well-nourished.  HENT:  Head: Normocephalic.  Patient has swelling and ecchymosis right for head and supra orbital ridge, 1 cm superficial laceration with abrasion right eyelid. Neck supple no midline tenderness. Full range of motion in neck.  Eyes: Conjunctivae are normal. Right eye exhibits no discharge. Left eye exhibits no discharge.  Neck: Normal range of motion. Neck supple. No tracheal deviation present.  Cardiovascular: Normal rate and regular rhythm.   Pulmonary/Chest: Effort normal and breath sounds normal.  Abdominal: Soft. She exhibits no distension. There is no tenderness. There is no guarding.  Musculoskeletal: She exhibits tenderness. She exhibits no edema.  Neurological: She is alert and oriented to person, place, and time. She has normal strength. No cranial nerve deficit or sensory deficit. GCS eye subscore is 4. GCS verbal subscore is 5. GCS motor subscore is 6.  normal vision bilateral  5+ strength in UE and LE with f/e at major joints. Sensation to palpation intact in UE and LE. CNs 2-12 grossly intact.  EOMFI.  PERRL.   Finger nose and coordination intact bilateral.    Visual fields intact to finger testing.   Skin: Skin is warm. No rash noted.  Psychiatric: She has a normal mood and affect.  Nursing note and vitals reviewed.   ED Course  Procedures (including critical care time) LACERATION REPAIR Performed by: Mariea Clonts Authorized by: Mariea Clonts Consent: Verbal consent obtained. Risks and benefits: risks, benefits and alternatives were discussed Consent given by: patient  Wound explored right eye lid superficial   Technique: dermabond  Patient tolerance: Patient tolerated the procedure well with no immediate complications.   Labs Review Labs Reviewed - No data to display  Imaging Review Ct Head Wo Contrast  02/19/2015   CLINICAL DATA:  Patient is standing on top of quality changing a light bulb, lost balance and fell. Large hematoma to the left forehead and eye. Laceration to the right eye. Loss of balance recently. History of breast cancer on chemotherapy.  EXAM: CT HEAD WITHOUT CONTRAST  CT MAXILLOFACIAL WITHOUT CONTRAST  TECHNIQUE: Multidetector CT  imaging of the head and maxillofacial structures were performed using the standard protocol without intravenous contrast. Multiplanar CT image reconstructions of the maxillofacial structures were also generated.  COMPARISON:  MRI brain 04/10/2014  FINDINGS: CT HEAD FINDINGS  Multiple low-attenuation lesions with increased density consistent with hemorrhages demonstrated throughout the cerebral and cerebellar hemispheres bilaterally. Largest lesions measure up to about 18 mm diameter. These are consistent with multiple hemorrhagic metastases. No significant mass effect or midline shift. No ventricular dilatation. No acute subdural or subarachnoid hematoma. Gray-white matter junctions are distinct. Basal cisterns are not effaced. Large central and right anterior frontal subcutaneous scalp hematoma. Calvarium appears intact. No depressed fractures. Mucosal thickening in the paranasal sinuses.  Mastoid air cells are not opacified.  CT MAXILLOFACIAL FINDINGS  The globes and extraocular muscles appear intact and symmetrical. Right periorbital soft tissue hematoma. No retrobulbar involvement. Diffuse mucosal thickening in the paranasal sinuses. No acute air-fluid levels. Orbital and facial bones, nasal bones, mandibles, and temporomandibular joints appear intact. No displaced acute fractures are demonstrated. Periapical lucencies in multiple upper and lower teeth consistent with periodontal disease.  IMPRESSION: Multiple hemorrhagic intracranial metastases. This represents significant progression of disease since the previous MRI. Central and right frontal scalp hematoma. Right periorbital soft tissue hematoma. No facial bone fractures identified.   Electronically Signed   By: Lucienne Capers M.D.   On: 02/19/2015 21:17   Ct Chest W Contrast  02/18/2015   CLINICAL DATA:  Left breast cancer, last chemotherapy 3 weeks ago, hysterectomy.  EXAM: CT CHEST, ABDOMEN, AND PELVIS WITH CONTRAST  TECHNIQUE: Multidetector CT imaging of the chest, abdomen and pelvis was performed following the standard protocol during bolus administration of intravenous contrast.  CONTRAST:  156mL OMNIPAQUE IOHEXOL 300 MG/ML  SOLN  COMPARISON:  11/29/2014.  FINDINGS: CT CHEST FINDINGS  CT CHEST FINDINGS  Mediastinum/Nodes: Low-attenuation lesions in the thyroid measure up to approximately 9 mm, as before. Left IJ Port-A-Cath terminates in the low SVC. Mediastinal lymph nodes measure up to 1.1 cm anterior to the right mainstem bronchus, stable. No hilar or left axillary adenopathy. Right axillary lymph nodes measure up to 1.4 cm in short axis (image 24), stable. There are adjacent cutaneous nodular lesions measuring approximately 1.8 x 3.6 cm (image 29), grossly stable. Asymmetric enlargement of the right breast with marked skin thickening and edematous changes in the subcutaneous fat. Findings are similar to 11/29/2014. Heart size  normal. No pericardial effusion.  Lungs/Pleura: There are new and enlarging bilateral pulmonary nodules, seen in a hematogenous distribution. Index subpleural lingular nodule measures 9 x 10 mm (series 4, image 33), previously 5 x 6 mm. Moderate bilateral effusions, slightly decreased in size. Minimal compressive atelectasis in both lower lobes. Airway is unremarkable.  Musculoskeletal: Scattered sclerotic foci in the spine are grossly stable. Old left eleventh rib fracture. There is sclerosis and irregularity involving the majority of the left eighth rib, as before. Bilateral cervical ribs are incidentally noted.  CT ABDOMEN AND PELVIS FINDINGS  Hepatobiliary: Irregular low-attenuation lesions in the liver have regressed slightly. Index left hepatic lobe lesion measures approximately 2.2 x 3.8 cm (series 2, image 51), previously 4.4 x 4.8 cm. Slight irregularity of the liver margin is indicative of post treatment fibrosis. Gallbladder is unremarkable. No biliary ductal dilatation.  Pancreas: Negative.  Spleen: Negative.  Adrenals/Urinary Tract: Right adrenal gland is unremarkable. There may be slight thickening of in the medial limb left adrenal gland, unchanged. 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 3 mm low-attenuation lesion in the lower pole left kidney is unchanged. Ureters are decompressed. Bladder is grossly unremarkable.  Stomach/Bowel: Stomach, small bowel and appendix are unremarkable. A fair amount of stool is seen in the colon.  Vascular/Lymphatic: Atherosclerotic calcification of the arterial vasculature without abdominal aortic aneurysm. 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 No pathologically enlarged lymph nodes.  Reproductive: Hysterectomy.  Ovaries are visualized.  Other: There are peritoneal nodules in the right abdomen, inferior to the left and right hepatic lobes. Index nodule inferior to the right hepatic lobe  measures 10 mm (series 2, image 75), new. Tiny pelvic free fluid.  Musculoskeletal: Mixed lytic and sclerotic lesions in the spine and pelvis are grossly unchanged. Lytic lesion in the right femoral neck is again seen and without evidence of pathologic fracture.  IMPRESSION: 1. Interval mixed response to therapy as evidenced by new and enlarging pulmonary nodules, slight regression of hepatic metastatic disease and new peritoneal nodules. Osseous metastatic disease appears grossly stable. 2. Malignant and/or posttreatment changes in the right breast with stable right axillary lymph nodes and right axillary cutaneous nodules. 3. Moderate bilateral effusions, decreased. 4. Tiny pelvic free fluid.   Electronically Signed   By: Lorin Picket M.D.   On: 02/18/2015 10:49   Ct Abdomen Pelvis W Contrast  02/18/2015   CLINICAL DATA:  Left breast cancer, last chemotherapy 3 weeks ago, hysterectomy.  EXAM: CT CHEST, ABDOMEN, AND PELVIS WITH CONTRAST  TECHNIQUE: Multidetector CT imaging of the chest, abdomen and pelvis was performed following the standard protocol during bolus administration of intravenous contrast.  CONTRAST:  142mL OMNIPAQUE IOHEXOL 300 MG/ML  SOLN  COMPARISON:  11/29/2014.  FINDINGS: CT CHEST FINDINGS  CT CHEST FINDINGS  Mediastinum/Nodes: Low-attenuation lesions in the thyroid measure up to approximately 9 mm, as before. Left IJ Port-A-Cath terminates in the low SVC. Mediastinal lymph nodes measure up to 1.1 cm anterior to the right mainstem bronchus, stable. No hilar or left axillary adenopathy. Right axillary lymph nodes measure up to 1.4 cm in short axis (image 24), stable. There are adjacent cutaneous nodular lesions measuring approximately 1.8 x 3.6 cm (image 29), grossly stable. Asymmetric enlargement of the right breast with marked skin thickening and edematous changes in the subcutaneous fat. Findings are similar to 11/29/2014. Heart size normal. No pericardial effusion.  Lungs/Pleura: There  are new and enlarging bilateral pulmonary nodules, seen in a hematogenous distribution. Index subpleural lingular nodule measures 9 x 10 mm (series 4, image 33), previously 5 x 6 mm. Moderate bilateral effusions, slightly decreased in size. Minimal compressive atelectasis in both lower lobes. Airway is unremarkable.  Musculoskeletal: Scattered sclerotic foci in the spine are grossly stable. Old left eleventh rib fracture. There is sclerosis and irregularity involving the majority of the left eighth rib, as before. Bilateral cervical ribs are incidentally noted.  CT ABDOMEN AND PELVIS FINDINGS  Hepatobiliary: Irregular low-attenuation lesions in the liver have regressed slightly. Index left hepatic lobe lesion measures approximately 2.2 x 3.8 cm (series 2, image 51), previously 4.4 x 4.8 cm. Slight irregularity of the liver margin is indicative of post treatment fibrosis. Gallbladder is unremarkable. No biliary ductal dilatation.  Pancreas: Negative.  Spleen: Negative.  Adrenals/Urinary Tract: Right adrenal gland is unremarkable. There may be slight thickening of in the medial limb left adrenal gland, unchanged. 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 3 mm low-attenuation lesion in the lower pole left kidney is  unchanged. Ureters are decompressed. Bladder is grossly unremarkable.  Stomach/Bowel: Stomach, small bowel and appendix are unremarkable. A fair amount of stool is seen in the colon.  Vascular/Lymphatic: Atherosclerotic calcification of the arterial vasculature without abdominal aortic aneurysm. 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 No pathologically enlarged lymph nodes.  Reproductive: Hysterectomy.  Ovaries are visualized.  Other: There are peritoneal nodules in the right abdomen, inferior to the left and right hepatic lobes. Index nodule inferior to the right hepatic lobe measures 10 mm (series 2, image 75), new. Tiny pelvic free  fluid.  Musculoskeletal: Mixed lytic and sclerotic lesions in the spine and pelvis are grossly unchanged. Lytic lesion in the right femoral neck is again seen and without evidence of pathologic fracture.  IMPRESSION: 1. Interval mixed response to therapy as evidenced by new and enlarging pulmonary nodules, slight regression of hepatic metastatic disease and new peritoneal nodules. Osseous metastatic disease appears grossly stable. 2. Malignant and/or posttreatment changes in the right breast with stable right axillary lymph nodes and right axillary cutaneous nodules. 3. Moderate bilateral effusions, decreased. 4. Tiny pelvic free fluid.   Electronically Signed   By: Lorin Picket M.D.   On: 02/18/2015 10:49   Ct Maxillofacial Wo Cm  02/19/2015   CLINICAL DATA:  Patient is standing on top of quality changing a light bulb, lost balance and fell. Large hematoma to the left forehead and eye. Laceration to the right eye. Loss of balance recently. History of breast cancer on chemotherapy.  EXAM: CT HEAD WITHOUT CONTRAST  CT MAXILLOFACIAL WITHOUT CONTRAST  TECHNIQUE: Multidetector CT imaging of the head and maxillofacial structures were performed using the standard protocol without intravenous contrast. Multiplanar CT image reconstructions of the maxillofacial structures were also generated.  COMPARISON:  MRI brain 04/10/2014  FINDINGS: CT HEAD FINDINGS  Multiple low-attenuation lesions with increased density consistent with hemorrhages demonstrated throughout the cerebral and cerebellar hemispheres bilaterally. Largest lesions measure up to about 18 mm diameter. These are consistent with multiple hemorrhagic metastases. No significant mass effect or midline shift. No ventricular dilatation. No acute subdural or subarachnoid hematoma. Gray-white matter junctions are distinct. Basal cisterns are not effaced. Large central and right anterior frontal subcutaneous scalp hematoma. Calvarium appears intact. No depressed  fractures. Mucosal thickening in the paranasal sinuses. Mastoid air cells are not opacified.  CT MAXILLOFACIAL FINDINGS  The globes and extraocular muscles appear intact and symmetrical. Right periorbital soft tissue hematoma. No retrobulbar involvement. Diffuse mucosal thickening in the paranasal sinuses. No acute air-fluid levels. Orbital and facial bones, nasal bones, mandibles, and temporomandibular joints appear intact. No displaced acute fractures are demonstrated. Periapical lucencies in multiple upper and lower teeth consistent with periodontal disease.  IMPRESSION: Multiple hemorrhagic intracranial metastases. This represents significant progression of disease since the previous MRI. Central and right frontal scalp hematoma. Right periorbital soft tissue hematoma. No facial bone fractures identified.   Electronically Signed   By: Lucienne Capers M.D.   On: 02/19/2015 21:17     EKG Interpretation   Date/Time:  Tuesday February 19 2015 19:03:26 EST Ventricular Rate:  71 PR Interval:  179 QRS Duration: 92 QT Interval:  432 QTC Calculation: 469 R Axis:   59 Text Interpretation:  Sinus rhythm Borderline T wave abnormalities  Confirmed by Yailin Biederman  MD, Taj Nevins ([id number]) on 02/19/2015 7:16:15 PM      MDM   Final diagnoses:  Acute head injury  Right eyelid laceration, initial encounter  Scalp hematoma, initial encounter  Patient with mechanical fall no obvious musculoskeletal injuries and laceration. Bleeding controlled, Dermabond ordered. EKG done in triage no acute findings, patient denies loss of consciousness chest pain or shortness of breath. Panel off her pain. CT scans pending and outpatient follow-up discussed.  CT scan results show worsening progression of metastasis, no vasogenic edema, patient has normal neuro exam. Discussed with Dr. Earlie Server oncology who recommends starting oral Decadron and she will be followed closely outpatient.  Results and differential diagnosis were  discussed with the patient/parent/guardian. Close follow up outpatient was discussed, comfortable with the plan.   Medications  acetaminophen (TYLENOL) tablet 650 mg (650 mg Oral Given 02/19/15 2022)    Filed Vitals:   02/19/15 1859 02/19/15 2156  BP: 156/98 164/83  Pulse: 75 67  Temp: 98.1 F (36.7 C)   TempSrc: Oral   Resp: 13 16  SpO2: 99% 99%    Final diagnoses:  Acute head injury  Right eyelid laceration, initial encounter  Scalp hematoma, initial encounter        Mariea Clonts, MD 02/19/15 2304

## 2015-02-19 NOTE — Discharge Instructions (Signed)
If you were given medicines take as directed.  If you are on coumadin or contraceptives realize their levels and effectiveness is altered by many different medicines.  If you have any reaction (rash, tongues swelling, other) to the medicines stop taking and see a physician.   Call oncologist tomorrow.  Please follow up as directed and return to the ER or see a physician for new or worsening symptoms.  Thank you. Filed Vitals:   02/19/15 1859 02/19/15 2156  BP: 156/98 164/83  Pulse: 75 67  Temp: 98.1 F (36.7 C)   TempSrc: Oral   Resp: 13 16  SpO2: 99% 99%

## 2015-02-19 NOTE — Telephone Encounter (Signed)
Ms. Tall sister called to notify us that [name] fell off a chair and bumped her head. She has a small knot now. She is refusing to go to the ED to be checked out. I encouraged her to go the ED 3 times but she still does not want to go. She denies dizziness or headache. No bleeding. We will see her on Thursday.

## 2015-02-19 NOTE — ED Notes (Signed)
Patient reports that she was standing on top of the toilet changing a light bulb. Patient states she lost her balance and fell. Patient does not know if she hit her head on the faucet or the tub. Patient has a large hematoma to the left forehead and eye. Patient has a small laceration to the right eye. Patient states she has been having loss of balance recently. Patient is a chemo patient/breast cancer.

## 2015-02-19 NOTE — ED Notes (Signed)
Pt denies LOC, n/v, blurred vision. Pt c/o slight headache and states she has to lift eyelid up to see at times.

## 2015-02-20 ENCOUNTER — Ambulatory Visit (HOSPITAL_BASED_OUTPATIENT_CLINIC_OR_DEPARTMENT_OTHER): Payer: Medicaid Other | Admitting: Family

## 2015-02-20 VITALS — BP 146/71 | HR 97 | Temp 98.1°F | Wt 146.0 lb

## 2015-02-20 DIAGNOSIS — C50911 Malignant neoplasm of unspecified site of right female breast: Secondary | ICD-10-CM

## 2015-02-20 DIAGNOSIS — R42 Dizziness and giddiness: Secondary | ICD-10-CM

## 2015-02-20 DIAGNOSIS — W08XXXD Fall from other furniture, subsequent encounter: Secondary | ICD-10-CM

## 2015-02-20 DIAGNOSIS — C50919 Malignant neoplasm of unspecified site of unspecified female breast: Secondary | ICD-10-CM

## 2015-02-20 DIAGNOSIS — I629 Nontraumatic intracranial hemorrhage, unspecified: Secondary | ICD-10-CM

## 2015-02-20 DIAGNOSIS — C7931 Secondary malignant neoplasm of brain: Secondary | ICD-10-CM

## 2015-02-20 MED ORDER — DEXAMETHASONE 4 MG PO TABS: 8.0000 mg | ORAL_TABLET | Freq: Three times a day (TID) | ORAL | Status: DC

## 2015-02-20 MED ORDER — FLUCONAZOLE 100 MG PO TABS: 100.0000 mg | ORAL_TABLET | Freq: Every day | ORAL | Status: DC

## 2015-02-20 MED ORDER — DEXAMETHASONE 4 MG PO TABS
20.0000 mg | ORAL_TABLET | Freq: Once | ORAL | Status: AC
  Administered 2015-02-20: 20 mg via ORAL

## 2015-02-20 MED ORDER — DEXAMETHASONE 4 MG PO TABS
ORAL_TABLET | ORAL | Status: AC
  Filled 2015-02-20: qty 5

## 2015-02-20 NOTE — Addendum Note (Signed)
Addended by: Volanda Napoleon on: 02/20/2015 06:34 PM   Modules accepted: Level of Service

## 2015-02-20 NOTE — Progress Notes (Addendum)
. [place]  [phone] [phone]  ID: Mary [name] OB: [birth year] MR#: [id number] JYN#:[id number] Patient Care Team: Biagio Borg, MD as PCP - General (Internal Medicine)  DIAGNOSIS: Metastatic breast cancer-ER positive/HER-2 positive  INTERVAL HISTORY: Mary [name] is back today after having a fall at home last night. She was standing on a stool putting a battery in a clock in the bathroom when she became dizzy and fell. She does not know what she hit her head. Her right eye is swollen and she had a laceration above it that was closed with Derma bond. Thankfully she went to the ED and they did a CT of her brain. This showed multiple hemorrhagic intracranial metastases and significant progression of disease. She had a CT of the Chest the day before which showed a mixed response to treatment. [name] spoke with radiation oncology and they are going to see her and determine whether she would benefit more from laser treatment or radiation to the whole head. Kadcyla stopped due to hepatic toxicity.  We will proceed with her first cycle of Carboplatin/Gemcitabine tomorrow.  The wound to her left breast looks the same. Still having some drainage at times.  She denies fever, chills, n/v, rash, cough, SOB, chest pain, palpitations, abdominal pain, constipation, diarrhea, problems urinating, blood in urine or stool.  She denies tenderness or numbness. She still has some intermittent tingling in her fingers and toes that she describes as mild and tolerable.  Her appetite is still good and she is drinking plenty of fluids. Her weight is stable at 151 lbs.  In February, her CA 27.29 was down to 67 and LDH in January was 277. November MUGA scan showed EF 62%.  CURRENT TREATMENT: Herceptin/Perjeta q 21 days Carboplatin/Gemcitabine q 21 days - starts tomorrow Zometa 4 mg IV Q3 weeks  REVIEW OF SYSTEMS: All other 10 point review of systems is negative except for  those issues mentioned above.   PAST MEDICAL HISTORY: Past Medical History  Diagnosis Date  . Arthritis   . Depression   . Fainting   . Headache   . Hypertension   . Migraines   . History of blood transfusion   . Gastric ulcer   . Breast cancer metastasized to multiple sites 04/12/2014   PAST SURGICAL HISTORY: Past Surgical History  Procedure Laterality Date  . Abdominal hysterectomy  15 years go    partial   FAMILY HISTORY Family History  Problem Relation Age of Onset  . Arthritis Other   . Hypertension Other   . Hypertension Other   . Heart disease Other   . Stroke Other    GYNECOLOGIC HISTORY:  No LMP recorded. Patient has had a hysterectomy.   SOCIAL HISTORY:  History   Social History  . Marital Status: Married    Spouse Name: N/A  . Number of Children: N/A  . Years of Education: N/A   Occupational History  .      no work for 4 to 5 years   Social History Main Topics  . Smoking status: Never Smoker   . Smokeless tobacco: Never Used     Comment: never used tobacco  . Alcohol Use: No  . Drug Use: No  . Sexual Activity: Not Currently   Other Topics Concern  . Not on file   Social History Narrative   ADVANCED DIRECTIVES: <no information>  HEALTH MAINTENANCE: History  Substance Use Topics  . Smoking status: Never Smoker   .  Smokeless tobacco: Never Used     Comment: never used tobacco  . Alcohol Use: No   Colonoscopy: PAP: Bone density: Lipid panel:  Allergies  Allergen Reactions  . Hydrocodone     "makes me crawl on the floor"  . Aspirin Other (See Comments)    Due to ulcers  . Penicillins Swelling    Current Outpatient Prescriptions  Medication Sig Dispense Refill  . acidophilus (RISAQUAD) CAPS capsule Take 1 capsule by mouth daily.    Marland Kitchen amLODipine (NORVASC) 5 MG tablet Take 1 tablet (5 mg total) by mouth every morning. 30 tablet 4  . Cyanocobalamin (VITAMIN B-12 PO) Take by mouth.    . Diphenhydramine-APAP, sleep, (HEADACHE  RELIEF PM) 38-500 MG TABS Take 2 tablets by mouth daily as needed (For headaches.).     Marland Kitchen escitalopram (LEXAPRO) 20 MG tablet Take 1 tablet (20 mg total) by mouth every morning. 30 tablet 4  . lidocaine-prilocaine (EMLA) cream Apply 1 application topically as needed. Apply quarter sized amount to portacath site at least one hour prior to treatment .  Cover with saran wrap 30 g 0  . lisinopril (PRINIVIL,ZESTRIL) 40 MG tablet Take 1 tablet (40 mg total) by mouth every morning. 30 tablet 4  . Multiple Vitamin (MULTIVITAMIN WITH MINERALS) TABS tablet Take 1 tablet by mouth daily.    Marland Kitchen oxyCODONE (OXY IR/ROXICODONE) 5 MG immediate release tablet Take 1-2 if needed for pain due to cancer. 120 tablet 0  . pantoprazole (PROTONIX) 40 MG tablet Take 1 tablet (40 mg total) by mouth daily. 30 tablet 4  . potassium chloride SA (K-DUR,KLOR-CON) 20 MEQ tablet Take 1 tablet (20 mEq total) by mouth 1 day or 1 dose. 30 tablet 0  . PRESCRIPTION MEDICATION Chemo at [facility] 01/22/2015.    . SUMAtriptan (IMITREX) 100 MG tablet Take 1 tablet (100 mg total) by mouth every 2 (two) hours as needed for migraine. 10 tablet 3  . dexamethasone (DECADRON) 4 MG tablet Take 2 tablets (8 mg total) by mouth 3 (three) times daily. 180 tablet 4  . fluconazole (DIFLUCAN) 100 MG tablet Take 1 tablet (100 mg total) by mouth daily. 30 tablet 11   No current facility-administered medications for this visit.   OBJECTIVE: Filed Vitals:   02/20/15 1251  BP: 146/71  Pulse: 97  Temp: 98.1 F (36.7 C)   Body mass index is 25.05 kg/(m^2). ECOG FS:1 - Symptomatic but completely ambulatory Ocular: Sclerae unicteric, pupils equal, round and reactive to light Ear-nose-throat: Oropharynx clear, dentition fair Lymphatic: No cervical or supraclavicular adenopathy Lungs no rales or rhonchi, good excursion bilaterally Heart regular rate and rhythm, no murmur appreciated Abd soft, nontender, positive bowel sounds MSK no focal spinal tenderness, no  joint edema Neuro: non-focal, well-oriented, appropriate affect Breasts: Wound is still draining a scant amount. No changes to her breasts.    LAB RESULTS: CMP     Component Value Date/Time   NA 139 02/12/2015 0909   NA 137 04/09/2014 0450   K 3.4 02/12/2015 0909   K 3.5* 04/09/2014 0450   CL 101 02/12/2015 0909   CL 101 04/09/2014 0450   CO2 24 02/12/2015 0909   CO2 23 04/09/2014 0450   GLUCOSE 165* 02/12/2015 0909   GLUCOSE 98 04/09/2014 0450   BUN 11 02/12/2015 0909   BUN 5* 04/09/2014 0450   CREATININE 0.5* 02/12/2015 0909   CREATININE 0.59 04/09/2014 0450   CALCIUM 9.2 02/12/2015 0909   CALCIUM 9.3 04/09/2014 0450   PROT  7.3 02/12/2015 0909   PROT 6.7 04/07/2014 0630   ALBUMIN 2.4* 04/07/2014 0630   AST 88* 02/12/2015 0909   AST 74* 04/07/2014 0630   ALT 53* 02/12/2015 0909   ALT 34 04/07/2014 0630   ALKPHOS 307* 02/12/2015 0909   ALKPHOS 179* 04/07/2014 0630   BILITOT 0.90 02/12/2015 0909   BILITOT 0.7 04/07/2014 0630   GFRNONAA >90 04/09/2014 0450   GFRAA >90 04/09/2014 0450   No results found for: SPEP Lab Results  Component Value Date   WBC 2.5* 02/12/2015   NEUTROABS 2.0 02/12/2015   HGB 11.9 02/12/2015   HCT 36.1 02/12/2015   MCV 96 02/12/2015   PLT 164 02/12/2015   Lab Results  Component Value Date   LABCA2 67* 02/12/2015   No components found for: [id number] No results for input(s): INR in the last 168 hours.  STUDIES:  CT Head wo contrast 02/19/15 IMPRESSION: Multiple hemorrhagic intracranial metastases. This represents significant progression of disease since the previous MRI. Central and right frontal scalp hematoma. Right periorbital soft tissue hematoma. No facial bone fractures identified.  ASSESSMENT/PLAN: Ms. Capetillo is 59 year old Afro-American female with metastatic breast cancer. She had dizziness and a fall last night hitting her head. CT showed progression of disease with hemorrhagic intracranial metastes.   We will get an MRI of  the brain on Friday.  She will start Decadron 8 mg TID and Diflucan 100 mg daily.  She will also be following up with radiation oncology.  She will have cycle 1 of Carboplatin/Gemcitabine tomorrow. She will also continue to get Herceptin/Perjeta.  She has her current treatment and appointment schedule.  All questions were answered and she is in agreement with the plan.  She knows to call here with any questions or concerns and to go to the ED in the event of an emergency. We can certainly see her again sooner if needed.   Eliezer Bottom, NP 02/20/2015 4:51 PM   ADDENDUM: I saw and examined patient with Mary [name].  She unfortunately has several new CNS metastases. These are hemorrhagic.  She does have HER-2 positive disease. It is known that patients with HER-2 positive breast cancer are at a higher risk for CNS disease.  We were going to start her on systemic chemotherapy tomorrow. Her recent CT scans show that she had a mixed response yet had some progression.  I spoke with Dr. Sondra Come of radiation oncology. He will get her set up to be seen for the possibility of stereotactic radiosurgery.  We gave her Decadron in the office. She got 20 mg. She did not yet taken any Decadron that she was given by the emergency room last night.  This definitely makes things a little more difficult for her. His been also year now since she was first diagnosed. She has done quite well. Unfortunately, I think that given that she does have HER-2 positive disease, that she is at risk for CNS involvement.  We will have to watch this closely in the future.

## 2015-02-21 ENCOUNTER — Ambulatory Visit (HOSPITAL_BASED_OUTPATIENT_CLINIC_OR_DEPARTMENT_OTHER): Payer: Medicaid Other

## 2015-02-21 ENCOUNTER — Encounter: Payer: Self-pay | Admitting: Hematology & Oncology

## 2015-02-21 ENCOUNTER — Other Ambulatory Visit (HOSPITAL_BASED_OUTPATIENT_CLINIC_OR_DEPARTMENT_OTHER): Payer: Medicaid Other | Admitting: Lab

## 2015-02-21 ENCOUNTER — Other Ambulatory Visit: Payer: Self-pay | Admitting: Radiation Therapy

## 2015-02-21 DIAGNOSIS — Z5112 Encounter for antineoplastic immunotherapy: Secondary | ICD-10-CM

## 2015-02-21 DIAGNOSIS — C7931 Secondary malignant neoplasm of brain: Secondary | ICD-10-CM

## 2015-02-21 DIAGNOSIS — C50911 Malignant neoplasm of unspecified site of right female breast: Secondary | ICD-10-CM

## 2015-02-21 DIAGNOSIS — C50919 Malignant neoplasm of unspecified site of unspecified female breast: Secondary | ICD-10-CM

## 2015-02-21 DIAGNOSIS — C787 Secondary malignant neoplasm of liver and intrahepatic bile duct: Secondary | ICD-10-CM

## 2015-02-21 DIAGNOSIS — C7951 Secondary malignant neoplasm of bone: Secondary | ICD-10-CM

## 2015-02-21 LAB — IRON AND TIBC CHCC
%SAT: 12 % — ABNORMAL LOW (ref 21–57)
Iron: 47 ug/dL (ref 41–142)
TIBC: 398 ug/dL (ref 236–444)
UIBC: 351 ug/dL (ref 120–384)

## 2015-02-21 LAB — CBC WITH DIFFERENTIAL (CANCER CENTER ONLY)
BASO#: 0 10*3/uL (ref 0.0–0.2)
BASO%: 0 % (ref 0.0–2.0)
EOS%: 0 % (ref 0.0–7.0)
Eosinophils Absolute: 0 10*3/uL (ref 0.0–0.5)
HEMATOCRIT: 35 % (ref 34.8–46.6)
HGB: 11.7 g/dL (ref 11.6–15.9)
LYMPH#: 0.6 10*3/uL — ABNORMAL LOW (ref 0.9–3.3)
LYMPH%: 23.1 % (ref 14.0–48.0)
MCH: 31.7 pg (ref 26.0–34.0)
MCHC: 33.4 g/dL (ref 32.0–36.0)
MCV: 95 fL (ref 81–101)
MONO#: 0.2 10*3/uL (ref 0.1–0.9)
MONO%: 7.2 % (ref 0.0–13.0)
NEUT#: 1.8 10*3/uL (ref 1.5–6.5)
NEUT%: 69.7 % (ref 39.6–80.0)
Platelets: 171 10*3/uL (ref 145–400)
RBC: 3.69 10*6/uL — ABNORMAL LOW (ref 3.70–5.32)
RDW: 14.9 % (ref 11.1–15.7)
WBC: 2.5 10*3/uL — AB (ref 3.9–10.0)

## 2015-02-21 LAB — CMP (CANCER CENTER ONLY)
ALBUMIN: 3.7 g/dL (ref 3.3–5.5)
ALK PHOS: 318 U/L — AB (ref 26–84)
ALT(SGPT): 77 U/L — ABNORMAL HIGH (ref 10–47)
AST: 104 U/L — ABNORMAL HIGH (ref 11–38)
BUN: 12 mg/dL (ref 7–22)
CALCIUM: 9 mg/dL (ref 8.0–10.3)
CHLORIDE: 103 meq/L (ref 98–108)
CO2: 24 meq/L (ref 18–33)
Creat: 0.8 mg/dl (ref 0.6–1.2)
GLUCOSE: 152 mg/dL — AB (ref 73–118)
Potassium: 3.6 mEq/L (ref 3.3–4.7)
SODIUM: 142 meq/L (ref 128–145)
TOTAL PROTEIN: 7.7 g/dL (ref 6.4–8.1)
Total Bilirubin: 1 mg/dl (ref 0.20–1.60)

## 2015-02-21 LAB — CANCER ANTIGEN 27.29: CA 27.29: 60 U/mL — AB (ref 0–39)

## 2015-02-21 LAB — LACTATE DEHYDROGENASE: LDH: 232 U/L (ref 94–250)

## 2015-02-21 LAB — FERRITIN CHCC: Ferritin: 151 ng/ml (ref 9–269)

## 2015-02-21 MED ORDER — SODIUM CHLORIDE 0.9 % IV SOLN
Freq: Once | INTRAVENOUS | Status: AC
  Administered 2015-02-21: 10:00:00 via INTRAVENOUS

## 2015-02-21 MED ORDER — SODIUM CHLORIDE 0.9 % IJ SOLN
10.0000 mL | INTRAMUSCULAR | Status: DC | PRN
  Administered 2015-02-21: 10 mL
  Filled 2015-02-21: qty 10

## 2015-02-21 MED ORDER — ONDANSETRON 16 MG/50ML IVPB (CHCC)
16.0000 mg | Freq: Once | INTRAVENOUS | Status: AC
  Administered 2015-02-21: 16 mg via INTRAVENOUS

## 2015-02-21 MED ORDER — PERTUZUMAB CHEMO INJECTION 420 MG/14ML
420.0000 mg | Freq: Once | INTRAVENOUS | Status: AC
  Administered 2015-02-21: 420 mg via INTRAVENOUS
  Filled 2015-02-21: qty 14

## 2015-02-21 MED ORDER — PROCHLORPERAZINE MALEATE 10 MG PO TABS: 10.0000 mg | ORAL_TABLET | Freq: Four times a day (QID) | ORAL | Status: DC | PRN

## 2015-02-21 MED ORDER — SODIUM CHLORIDE 0.9 % IV SOLN: Freq: Once | INTRAVENOUS | Status: DC

## 2015-02-21 MED ORDER — LORAZEPAM 0.5 MG PO TABS: 0.5000 mg | ORAL_TABLET | Freq: Four times a day (QID) | ORAL | Status: DC | PRN

## 2015-02-21 MED ORDER — SODIUM CHLORIDE 0.9 % IV SOLN
800.0000 mg/m2 | Freq: Once | INTRAVENOUS | Status: AC
  Administered 2015-02-21: 1368 mg via INTRAVENOUS
  Filled 2015-02-21: qty 35.98

## 2015-02-21 MED ORDER — ACETAMINOPHEN 325 MG PO TABS
ORAL_TABLET | ORAL | Status: AC
  Filled 2015-02-21: qty 2

## 2015-02-21 MED ORDER — SODIUM CHLORIDE 0.9 % IV SOLN
523.0000 mg | Freq: Once | INTRAVENOUS | Status: AC
  Administered 2015-02-21: 520 mg via INTRAVENOUS
  Filled 2015-02-21: qty 52

## 2015-02-21 MED ORDER — ZOLEDRONIC ACID 4 MG/100ML IV SOLN
4.0000 mg | Freq: Once | INTRAVENOUS | Status: AC
  Administered 2015-02-21: 4 mg via INTRAVENOUS
  Filled 2015-02-21: qty 100

## 2015-02-21 MED ORDER — SODIUM CHLORIDE 0.9 % IJ SOLN
10.0000 mL | INTRAMUSCULAR | Status: DC | PRN
  Filled 2015-02-21: qty 10

## 2015-02-21 MED ORDER — DIPHENHYDRAMINE HCL 25 MG PO CAPS
50.0000 mg | ORAL_CAPSULE | Freq: Once | ORAL | Status: AC
  Administered 2015-02-21: 50 mg via ORAL

## 2015-02-21 MED ORDER — DEXAMETHASONE SODIUM PHOSPHATE 20 MG/5ML IJ SOLN
20.0000 mg | Freq: Once | INTRAMUSCULAR | Status: AC
  Administered 2015-02-21: 20 mg via INTRAVENOUS

## 2015-02-21 MED ORDER — ACETAMINOPHEN 325 MG PO TABS
650.0000 mg | ORAL_TABLET | Freq: Once | ORAL | Status: AC
  Administered 2015-02-21: 650 mg via ORAL

## 2015-02-21 MED ORDER — ALTEPLASE 2 MG IJ SOLR
2.0000 mg | Freq: Once | INTRAMUSCULAR | Status: DC | PRN
  Filled 2015-02-21: qty 2

## 2015-02-21 MED ORDER — SODIUM CHLORIDE 0.9 % IV SOLN: 510.0000 mg | Freq: Once | INTRAVENOUS | Status: DC

## 2015-02-21 MED ORDER — HEPARIN SOD (PORK) LOCK FLUSH 100 UNIT/ML IV SOLN
500.0000 [IU] | Freq: Once | INTRAVENOUS | Status: DC | PRN
  Filled 2015-02-21: qty 5

## 2015-02-21 MED ORDER — ONDANSETRON 16 MG/50ML IVPB (CHCC)
INTRAVENOUS | Status: AC
  Filled 2015-02-21: qty 16

## 2015-02-21 MED ORDER — DIPHENHYDRAMINE HCL 25 MG PO CAPS
ORAL_CAPSULE | ORAL | Status: AC
  Filled 2015-02-21: qty 2

## 2015-02-21 MED ORDER — DEXAMETHASONE SODIUM PHOSPHATE 20 MG/5ML IJ SOLN
INTRAMUSCULAR | Status: AC
  Filled 2015-02-21: qty 5

## 2015-02-21 MED ORDER — ONDANSETRON HCL 8 MG PO TABS: 8.0000 mg | ORAL_TABLET | Freq: Two times a day (BID) | ORAL | Status: DC

## 2015-02-21 MED ORDER — HEPARIN SOD (PORK) LOCK FLUSH 100 UNIT/ML IV SOLN
500.0000 [IU] | Freq: Once | INTRAVENOUS | Status: AC | PRN
  Administered 2015-02-21: 500 [IU]
  Filled 2015-02-21: qty 5

## 2015-02-21 MED ORDER — SODIUM CHLORIDE 0.9 % IJ SOLN
3.0000 mL | Freq: Once | INTRAMUSCULAR | Status: DC | PRN
  Filled 2015-02-21: qty 10

## 2015-02-21 MED ORDER — TRASTUZUMAB CHEMO INJECTION 440 MG
6.0000 mg/kg | Freq: Once | INTRAVENOUS | Status: AC
  Administered 2015-02-21: 399 mg via INTRAVENOUS
  Filled 2015-02-21: qty 19

## 2015-02-21 MED ORDER — HEPARIN SOD (PORK) LOCK FLUSH 100 UNIT/ML IV SOLN
250.0000 [IU] | Freq: Once | INTRAVENOUS | Status: DC | PRN
  Filled 2015-02-21: qty 5

## 2015-02-21 NOTE — Progress Notes (Signed)
Per [name] it ok to treat with [phone]

## 2015-02-21 NOTE — Patient Instructions (Signed)
Carboplatin injection What is this medicine? CARBOPLATIN (KAR boe pla tin) is a chemotherapy drug. It targets fast dividing cells, like cancer cells, and causes these cells to die. This medicine is used to treat ovarian cancer and many other cancers. This medicine may be used for other purposes; ask your health care provider or pharmacist if you have questions. COMMON BRAND NAME(S): Paraplatin What should I tell my health care provider before I take this medicine? They need to know if you have any of these conditions: -blood disorders -hearing problems -kidney disease -recent or ongoing radiation therapy -an unusual or allergic reaction to carboplatin, cisplatin, other chemotherapy, other medicines, foods, dyes, or preservatives -pregnant or trying to get pregnant -breast-feeding How should I use this medicine? This drug is usually given as an infusion into a vein. It is administered in a hospital or clinic by a specially trained health care professional. Talk to your pediatrician regarding the use of this medicine in children. Special care may be needed. Overdosage: If you think you have taken too much of this medicine contact a poison control center or emergency room at once. NOTE: This medicine is only for you. Do not share this medicine with others. What if I miss a dose? It is important not to miss a dose. Call your doctor or health care professional if you are unable to keep an appointment. What may interact with this medicine? -medicines for seizures -medicines to increase blood counts like filgrastim, pegfilgrastim, sargramostim -some antibiotics like amikacin, gentamicin, neomycin, streptomycin, tobramycin -vaccines Talk to your doctor or health care professional before taking any of these medicines: -acetaminophen -aspirin -ibuprofen -ketoprofen -naproxen This list may not describe all possible interactions. Give your health care provider a list of all the medicines,  herbs, non-prescription drugs, or dietary supplements you use. Also tell them if you smoke, drink alcohol, or use illegal drugs. Some items may interact with your medicine. What should I watch for while using this medicine? Your condition will be monitored carefully while you are receiving this medicine. You will need important blood work done while you are taking this medicine. This drug may make you feel generally unwell. This is not uncommon, as chemotherapy can affect healthy cells as well as cancer cells. Report any side effects. Continue your course of treatment even though you feel ill unless your doctor tells you to stop. In some cases, you may be given additional medicines to help with side effects. Follow all directions for their use. Call your doctor or health care professional for advice if you get a fever, chills or sore throat, or other symptoms of a cold or flu. Do not treat yourself. This drug decreases your body's ability to fight infections. Try to avoid being around people who are sick. This medicine may increase your risk to bruise or bleed. Call your doctor or health care professional if you notice any unusual bleeding. Be careful brushing and flossing your teeth or using a toothpick because you may get an infection or bleed more easily. If you have any dental work done, tell your dentist you are receiving this medicine. Avoid taking products that contain aspirin, acetaminophen, ibuprofen, naproxen, or ketoprofen unless instructed by your doctor. These medicines may hide a fever. Do not become pregnant while taking this medicine. Women should inform their doctor if they wish to become pregnant or think they might be pregnant. There is a potential for serious side effects to an unborn child. Talk to your health care professional  or pharmacist for more information. Do not breast-feed an infant while taking this medicine. What side effects may I notice from receiving this medicine? Side  effects that you should report to your doctor or health care professional as soon as possible: -allergic reactions like skin rash, itching or hives, swelling of the face, lips, or tongue -signs of infection - fever or chills, cough, sore throat, pain or difficulty passing urine -signs of decreased platelets or bleeding - bruising, pinpoint red spots on the skin, black, tarry stools, nosebleeds -signs of decreased red blood cells - unusually weak or tired, fainting spells, lightheadedness -breathing problems -changes in hearing -changes in vision -chest pain -high blood pressure -low blood counts - This drug may decrease the number of white blood cells, red blood cells and platelets. You may be at increased risk for infections and bleeding. -nausea and vomiting -pain, swelling, redness or irritation at the injection site -pain, tingling, numbness in the hands or feet -problems with balance, talking, walking -trouble passing urine or change in the amount of urine Side effects that usually do not require medical attention (report to your doctor or health care professional if they continue or are bothersome): -hair loss -loss of appetite -metallic taste in the mouth or changes in taste This list may not describe all possible side effects. Call your doctor for medical advice about side effects. You may report side effects to FDA at 1-800-FDA-1088. Where should I keep my medicine? This drug is given in a hospital or clinic and will not be stored at home. NOTE: This sheet is a summary. It may not cover all possible information. If you have questions about this medicine, talk to your doctor, pharmacist, or health care provider.  2015, [employer] Standard. (2008-03-20 14:38:05)    Gemcitabine injection What is this medicine? GEMCITABINE (jem SIT a been) is a chemotherapy drug. This medicine is used to treat many types of cancer like breast cancer, lung cancer, pancreatic cancer, and ovarian  cancer. This medicine may be used for other purposes; ask your health care provider or pharmacist if you have questions. COMMON BRAND NAME(S): Gemzar What should I tell my health care provider before I take this medicine? They need to know if you have any of these conditions: -blood disorders -infection -kidney disease -liver disease -recent or ongoing radiation therapy -an unusual or allergic reaction to gemcitabine, other chemotherapy, other medicines, foods, dyes, or preservatives -pregnant or trying to get pregnant -breast-feeding How should I use this medicine? This drug is given as an infusion into a vein. It is administered in a hospital or clinic by a specially trained health care professional. Talk to your pediatrician regarding the use of this medicine in children. Special care may be needed. Overdosage: If you think you have taken too much of this medicine contact a poison control center or emergency room at once. NOTE: This medicine is only for you. Do not share this medicine with others. What if I miss a dose? It is important not to miss your dose. Call your doctor or health care professional if you are unable to keep an appointment. What may interact with this medicine? -medicines to increase blood counts like filgrastim, pegfilgrastim, sargramostim -some other chemotherapy drugs like cisplatin -vaccines Talk to your doctor or health care professional before taking any of these medicines: -acetaminophen -aspirin -ibuprofen -ketoprofen -naproxen This list may not describe all possible interactions. Give your health care provider a list of all the medicines, herbs, non-prescription drugs, or dietary  supplements you use. Also tell them if you smoke, drink alcohol, or use illegal drugs. Some items may interact with your medicine. What should I watch for while using this medicine? Visit your doctor for checks on your progress. This drug may make you feel generally unwell.  This is not uncommon, as chemotherapy can affect healthy cells as well as cancer cells. Report any side effects. Continue your course of treatment even though you feel ill unless your doctor tells you to stop. In some cases, you may be given additional medicines to help with side effects. Follow all directions for their use. Call your doctor or health care professional for advice if you get a fever, chills or sore throat, or other symptoms of a cold or flu. Do not treat yourself. This drug decreases your body's ability to fight infections. Try to avoid being around people who are sick. This medicine may increase your risk to bruise or bleed. Call your doctor or health care professional if you notice any unusual bleeding. Be careful brushing and flossing your teeth or using a toothpick because you may get an infection or bleed more easily. If you have any dental work done, tell your dentist you are receiving this medicine. Avoid taking products that contain aspirin, acetaminophen, ibuprofen, naproxen, or ketoprofen unless instructed by your doctor. These medicines may hide a fever. Women should inform their doctor if they wish to become pregnant or think they might be pregnant. There is a potential for serious side effects to an unborn child. Talk to your health care professional or pharmacist for more information. Do not breast-feed an infant while taking this medicine. What side effects may I notice from receiving this medicine? Side effects that you should report to your doctor or health care professional as soon as possible: -allergic reactions like skin rash, itching or hives, swelling of the face, lips, or tongue -low blood counts - this medicine may decrease the number of white blood cells, red blood cells and platelets. You may be at increased risk for infections and bleeding. -signs of infection - fever or chills, cough, sore throat, pain or difficulty passing urine -signs of decreased platelets  or bleeding - bruising, pinpoint red spots on the skin, black, tarry stools, blood in the urine -signs of decreased red blood cells - unusually weak or tired, fainting spells, lightheadedness -breathing problems -chest pain -mouth sores -nausea and vomiting -pain, swelling, redness at site where injected -pain, tingling, numbness in the hands or feet -stomach pain -swelling of ankles, feet, hands -unusual bleeding Side effects that usually do not require medical attention (report to your doctor or health care professional if they continue or are bothersome): -constipation -diarrhea -hair loss -loss of appetite -stomach upset This list may not describe all possible side effects. Call your doctor for medical advice about side effects. You may report side effects to FDA at 1-800-FDA-1088. Where should I keep my medicine? This drug is given in a hospital or clinic and will not be stored at home. NOTE: This sheet is a summary. It may not cover all possible information. If you have questions about this medicine, talk to your doctor, pharmacist, or health care provider.  2015, [employer] Standard. (2008-04-24 18:45:54)  Ado-Trastuzumab Emtansine for injection What is this medicine? Ado-trastuzumab emtansine (ADD oh traz TOO zuh mab em TAN zine) is a monoclonal antibody combined with chemotherapy. It targets a protein called HER2. This protein is found in some stomach and breast cancers. This medicine can stop  cancer cell growth. This medicine may be used for other purposes; ask your health care provider or pharmacist if you have questions. COMMON BRAND NAME(S): Kadcyla What should I tell my health care provider before I take this medicine? They need to know if you have any of these conditions: -heart disease -heart failure -infection (especially a virus infection such as chickenpox, cold sores, or herpes) -liver disease -lung or breathing disease, like asthma -an unusual or allergic  reaction to ado-trastuzumab emtansine, other medications, foods, dyes, or preservatives -pregnant or trying to get pregnant -breast-feeding How should I use this medicine? This medicine is for infusion into a vein. It is given by a health care professional in a hospital or clinic setting. Talk to your pediatrician regarding the use of this medicine in children. Special care may be needed. Overdosage: If you think you've taken too much of this medicine contact a poison control center or emergency room at once. Overdosage: If you think you have taken too much of this medicine contact a poison control center or emergency room at once. NOTE: This medicine is only for you. Do not share this medicine with others. What if I miss a dose? It is important not to miss your dose. Call your doctor or health care professional if you are unable to keep an appointment. What may interact with this medicine? This medicine may also interact with the following medications: -atazanavir -boceprevir -clarithromycin -delavirdine -indinavir -dalfopristin; quinupristin -isoniazid, INH -itraconazole -ketoconazole -nefazodone -nelfinavir -ritonavir -telaprevir -telithromycin -tipranavir -voriconazole This list may not describe all possible interactions. Give your health care provider a list of all the medicines, herbs, non-prescription drugs, or dietary supplements you use. Also tell them if you smoke, drink alcohol, or use illegal drugs. Some items may interact with your medicine. What should I watch for while using this medicine? Visit your doctor for checks on your progress. This drug may make you feel generally unwell. This is not uncommon, as chemotherapy can affect healthy cells as well as cancer cells. Report any side effects. Continue your course of treatment even though you feel ill unless your doctor tells you to stop. Call your doctor or health care professional for advice if you get a fever, chills  or sore throat, or other symptoms of a cold or flu. Do not treat yourself. This drug decreases your body's ability to fight infections. Try to avoid being around people who are sick. Be careful brushing and flossing your teeth or using a toothpick because you may get an infection or bleed more easily. If you have any dental work done, tell your dentist you are receiving this medicine. Avoid taking products that contain aspirin, acetaminophen, ibuprofen, naproxen, or ketoprofen unless instructed by your doctor. These medicines may hide a fever. Do not become pregnant while taking this medicine. Women should inform their doctor if they wish to become pregnant or think they might be pregnant. There is a potential for serious side effects to an unborn child. [Men should inform their doctors if they wish to father a child. This medicine may lower sperm counts.] Talk to your health care professional or pharmacist for more information. Do not breast-feed an infant while taking this medicine. You may need blood work done while you are taking this medicine. What side effects may I notice from receiving this medicine? Side effects that you should report to your doctor or health care professional as soon as possible: -allergic reactions like skin rash, itching or hives, swelling of the face,  lips, or tongue -breathing problems -chest pain or palpitations -fever or chills, sore throat -general ill feeling or flu-like symptoms -light-colored stools -nausea, vomiting -pain, tingling, numbness in the hands or feet -signs and symptoms of bleeding such as bloody or black, tarry stools; red or dark-brown urine; spitting up blood or brown material that looks like coffee grounds; red spots on the skin; unusual bruising or bleeding from the eye, gums, or nose -swelling of the legs or ankles -yellowing of the eyes or skin Side effects that usually do not require medical attention (Report these to your doctor or health  care professional if they continue or are bothersome.): -changes in taste -constipation -dizziness -headache -joint pain -muscle pain -trouble sleeping -unusually weak or tired This list may not describe all possible side effects. Call your doctor for medical advice about side effects. You may report side effects to FDA at 1-800-FDA-1088. Where should I keep my medicine? This drug is given in a hospital or clinic and will not be stored at home. NOTE: This sheet is a summary. It may not cover all possible information. If you have questions about this medicine, talk to your doctor, pharmacist, or health care provider.  2015, [employer] Standard. (2013-04-25 12:55:10)  Pertuzumab injection What is this medicine? PERTUZUMAB (per TOOZ ue mab) is a monoclonal antibody that targets a protein called HER2. HER2 is found in some breast cancers. This medicine can stop cancer cell growth. This medicine is used with other cancer treatments. This medicine may be used for other purposes; ask your health care provider or pharmacist if you have questions. COMMON BRAND NAME(S): PERJETA What should I tell my health care provider before I take this medicine? They need to know if you have any of these conditions: -heart disease -heart failure -high blood pressure -history of irregular heart beat -recent or ongoing radiation therapy -an unusual or allergic reaction to pertuzumab, other medicines, foods, dyes, or preservatives -pregnant or trying to get pregnant -breast-feeding How should I use this medicine? This medicine is for infusion into a vein. It is given by a health care professional in a hospital or clinic setting. Talk to your pediatrician regarding the use of this medicine in children. Special care may be needed. Overdosage: If you think you've taken too much of this medicine contact a poison control center or emergency room at once. Overdosage: If you think you have taken too much of this  medicine contact a poison control center or emergency room at once. NOTE: This medicine is only for you. Do not share this medicine with others. What if I miss a dose? It is important not to miss your dose. Call your doctor or health care professional if you are unable to keep an appointment. What may interact with this medicine? Interactions are not expected. Give your health care provider a list of all the medicines, herbs, non-prescription drugs, or dietary supplements you use. Also tell them if you smoke, drink alcohol, or use illegal drugs. Some items may interact with your medicine. This list may not describe all possible interactions. Give your health care provider a list of all the medicines, herbs, non-prescription drugs, or dietary supplements you use. Also tell them if you smoke, drink alcohol, or use illegal drugs. Some items may interact with your medicine. What should I watch for while using this medicine? Your condition will be monitored carefully while you are receiving this medicine. Report any side effects. Continue your course of treatment even though you feel ill  unless your doctor tells you to stop. Do not become pregnant while taking this medicine. Women should inform their doctor if they wish to become pregnant or think they might be pregnant. There is a potential for serious side effects to an unborn child. Talk to your health care professional or pharmacist for more information. Do not breast-feed an infant while taking this medicine. Call your doctor or health care professional for advice if you get a fever, chills or sore throat, or other symptoms of a cold or flu. Do not treat yourself. Try to avoid being around people who are sick. You may experience fever, chills, and headache during the infusion. Report any side effects during the infusion to your health care professional. What side effects may I notice from receiving this medicine? Side effects that you should report to  your doctor or health care professional as soon as possible: -breathing problems -chest pain or palpitations -dizziness -feeling faint or lightheaded -fever or chills -skin rash, itching or hives -sore throat -swelling of the face, lips, or tongue -swelling of the legs or ankles -unusually weak or tired Side effects that usually do not require medical attention (Report these to your doctor or health care professional if they continue or are bothersome.): -diarrhea -hair loss -nausea, vomiting -tiredness This list may not describe all possible side effects. Call your doctor for medical advice about side effects. You may report side effects to FDA at 1-800-FDA-1088. Where should I keep my medicine? This drug is given in a hospital or clinic and will not be stored at home. NOTE: This sheet is a summary. It may not cover all possible information. If you have questions about this medicine, talk to your doctor, pharmacist, or health care provider.  2015, [employer] Standard. (2012-10-12 16:54:15)

## 2015-02-25 ENCOUNTER — Ambulatory Visit: Payer: Medicaid Other

## 2015-02-25 ENCOUNTER — Ambulatory Visit: Payer: Medicaid Other | Admitting: Hematology & Oncology

## 2015-02-25 ENCOUNTER — Other Ambulatory Visit: Payer: Medicaid Other | Admitting: Lab

## 2015-02-26 ENCOUNTER — Encounter: Payer: Self-pay | Admitting: Radiation Oncology

## 2015-02-26 NOTE — Progress Notes (Signed)
Showing auth for [id number] (MRI brain) on [employer] [phone]

## 2015-02-28 ENCOUNTER — Other Ambulatory Visit (HOSPITAL_BASED_OUTPATIENT_CLINIC_OR_DEPARTMENT_OTHER): Payer: Medicaid Other | Admitting: Lab

## 2015-02-28 ENCOUNTER — Other Ambulatory Visit: Payer: Self-pay | Admitting: *Deleted

## 2015-02-28 ENCOUNTER — Encounter: Payer: Self-pay | Admitting: Radiation Oncology

## 2015-02-28 ENCOUNTER — Ambulatory Visit (HOSPITAL_BASED_OUTPATIENT_CLINIC_OR_DEPARTMENT_OTHER): Payer: Medicaid Other

## 2015-02-28 DIAGNOSIS — C50911 Malignant neoplasm of unspecified site of right female breast: Secondary | ICD-10-CM

## 2015-02-28 DIAGNOSIS — C50919 Malignant neoplasm of unspecified site of unspecified female breast: Secondary | ICD-10-CM

## 2015-02-28 DIAGNOSIS — Z5111 Encounter for antineoplastic chemotherapy: Secondary | ICD-10-CM

## 2015-02-28 DIAGNOSIS — Z5189 Encounter for other specified aftercare: Secondary | ICD-10-CM

## 2015-02-28 DIAGNOSIS — C7951 Secondary malignant neoplasm of bone: Secondary | ICD-10-CM

## 2015-02-28 LAB — CBC WITH DIFFERENTIAL (CANCER CENTER ONLY)
BASO#: 0 10*3/uL (ref 0.0–0.2)
BASO%: 0 % (ref 0.0–2.0)
EOS%: 0 % (ref 0.0–7.0)
Eosinophils Absolute: 0 10*3/uL (ref 0.0–0.5)
HCT: 33.8 % — ABNORMAL LOW (ref 34.8–46.6)
HGB: 11.4 g/dL — ABNORMAL LOW (ref 11.6–15.9)
LYMPH#: 0.8 10*3/uL — ABNORMAL LOW (ref 0.9–3.3)
LYMPH%: 39.5 % (ref 14.0–48.0)
MCH: 32 pg (ref 26.0–34.0)
MCHC: 33.7 g/dL (ref 32.0–36.0)
MCV: 95 fL (ref 81–101)
MONO#: 0 10*3/uL — ABNORMAL LOW (ref 0.1–0.9)
MONO%: 2 % (ref 0.0–13.0)
NEUT#: 1.2 10*3/uL — ABNORMAL LOW (ref 1.5–6.5)
NEUT%: 58.5 % (ref 39.6–80.0)
PLATELETS: 186 10*3/uL (ref 145–400)
RBC: 3.56 10*6/uL — AB (ref 3.70–5.32)
RDW: 14.5 % (ref 11.1–15.7)
WBC: 2 10*3/uL — ABNORMAL LOW (ref 3.9–10.0)

## 2015-02-28 LAB — CMP (CANCER CENTER ONLY)
ALBUMIN: 3.3 g/dL (ref 3.3–5.5)
ALK PHOS: 273 U/L — AB (ref 26–84)
ALT(SGPT): 141 U/L — ABNORMAL HIGH (ref 10–47)
AST: 92 U/L — AB (ref 11–38)
BILIRUBIN TOTAL: 0.8 mg/dL (ref 0.20–1.60)
BUN, Bld: 22 mg/dL (ref 7–22)
CO2: 26 mEq/L (ref 18–33)
Calcium: 8.4 mg/dL (ref 8.0–10.3)
Chloride: 103 mEq/L (ref 98–108)
Creat: 0.5 mg/dl — ABNORMAL LOW (ref 0.6–1.2)
GLUCOSE: 114 mg/dL (ref 73–118)
POTASSIUM: 3.6 meq/L (ref 3.3–4.7)
SODIUM: 135 meq/L (ref 128–145)
TOTAL PROTEIN: 6.7 g/dL (ref 6.4–8.1)

## 2015-02-28 MED ORDER — SODIUM CHLORIDE 0.9 % IJ SOLN
10.0000 mL | INTRAMUSCULAR | Status: DC | PRN
  Administered 2015-02-28: 10 mL
  Filled 2015-02-28: qty 10

## 2015-02-28 MED ORDER — HEPARIN SOD (PORK) LOCK FLUSH 100 UNIT/ML IV SOLN
500.0000 [IU] | Freq: Once | INTRAVENOUS | Status: AC | PRN
  Administered 2015-02-28: 500 [IU]
  Filled 2015-02-28: qty 5

## 2015-02-28 MED ORDER — PEGFILGRASTIM 6 MG/0.6ML ~~LOC~~ PSKT
6.0000 mg | PREFILLED_SYRINGE | Freq: Once | SUBCUTANEOUS | Status: AC
  Administered 2015-02-28: 6 mg via SUBCUTANEOUS
  Filled 2015-02-28: qty 0.6

## 2015-02-28 MED ORDER — SODIUM CHLORIDE 0.9 % IV SOLN
800.0000 mg/m2 | Freq: Once | INTRAVENOUS | Status: AC
  Administered 2015-02-28: 1368 mg via INTRAVENOUS
  Filled 2015-02-28: qty 26.3

## 2015-02-28 MED ORDER — PROCHLORPERAZINE MALEATE 10 MG PO TABS
ORAL_TABLET | ORAL | Status: AC
  Filled 2015-02-28: qty 1

## 2015-02-28 MED ORDER — PROCHLORPERAZINE MALEATE 10 MG PO TABS
10.0000 mg | ORAL_TABLET | Freq: Once | ORAL | Status: AC
  Administered 2015-02-28: 10 mg via ORAL

## 2015-02-28 MED ORDER — SODIUM CHLORIDE 0.9 % IV SOLN
Freq: Once | INTRAVENOUS | Status: AC
  Administered 2015-02-28: 14:00:00 via INTRAVENOUS

## 2015-02-28 NOTE — Progress Notes (Signed)
Ok to treat despite lab counts per Dr Marin Olp.

## 2015-02-28 NOTE — Progress Notes (Signed)
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  Patient presented with symptoms of:  Dizziness that resulted in a fall at her home on 02/19/2015  Past or anticipated interventions, if any, per neurosurgery: no  Past or anticipated interventions, if any, per medical oncology: Herceptin/Perjeta every 21 days, carboplatin/gemcitabine every 21 days, and Zometa 4 mg IV every 3 weeks  Dose of Decadron, if applicable: decadron 8 mg tid  Recent neurologic symptoms, if any:   Seizures:   Headaches:   Nausea: no  Dizziness/ataxia: yes  Difficulty with hand coordination:   Focal numbness/weakness: denies tenderness or numbness but, does report some intermittent tingling in her fingers and toes that is mild and tolerable  Visual deficits/changes:   Confusion/Memory deficits:   Painful bone metastases at present, if any: no  SAFETY ISSUES:  Prior radiation?   Pacemaker/ICD? no  Possible current pregnancy? no  Is the patient on methotrexate? 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 Married

## 2015-02-28 NOTE — Patient Instructions (Signed)

## 2015-03-01 ENCOUNTER — Ambulatory Visit: Payer: Medicaid Other

## 2015-03-01 ENCOUNTER — Ambulatory Visit
Admission: RE | Admit: 2015-03-01 | Discharge: 2015-03-01 | Disposition: A | Discharge status: Home / Self Care | Payer: Medicaid Other | Source: Ambulatory Visit | Attending: Radiation Oncology | Admitting: Radiation Oncology

## 2015-03-01 DIAGNOSIS — C7931 Secondary malignant neoplasm of brain: Secondary | ICD-10-CM

## 2015-03-01 MED ORDER — GADOBENATE DIMEGLUMINE 529 MG/ML IV SOLN
13.0000 mL | Freq: Once | INTRAVENOUS | Status: AC | PRN
  Administered 2015-03-01: 13 mL via INTRAVENOUS

## 2015-03-03 NOTE — Progress Notes (Signed)
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 Breast cancer metastasized to multiple sites, unspecified laterality 174.9 C50.919    199.0 C79.9     HISTORY OF PRESENT ILLNESS::Mary [name] is a 59 y.o. female who presented in April 2015 with a large right breast mass and apparent metastatic disease to the liver. 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 Staging brain MRI at that time showed no intracranial metastases. The patient has been receiving systemic chemotherapy initially receiving Cytoxan/Taxotere/Herceptin/Perjeta with Zometa.  She had a mixed response and was switched to Kadcyla with Perjeta. On 02/19/2015, the patient was changing a light bulb while standing on top of her toilet. She lost her balance and fell and hit her head on the faucete.  Head CT demonstrated multiple hemorrhagic intracranial metastases.  She was discharged to home with 8 mg dexamethasone every 8 hours and underwent brain MRI on 03/01/2015. This study showed at least 75 brain metastases of varying size, several showing internal hemorrhage. She has kindly been referred today for consideration of possible radiation treatment options.  PREVIOUS RADIATION THERAPY: No  PAST MEDICAL HISTORY:  has a past medical history of Arthritis; Depression; Fainting; Headache; Hypertension; Migraines; History of blood transfusion; Gastric ulcer; and Breast cancer metastasized to multiple sites (04/12/2014).    PAST SURGICAL HISTORY: Past Surgical History  Procedure Laterality Date  .  Abdominal hysterectomy  15 years go    partial    FAMILY HISTORY: family history includes Arthritis in her other; Cancer in her sister; Heart disease in her other; Hypertension in her other and other; Stroke in her other.  SOCIAL HISTORY:  reports that she has never smoked. She has never used smokeless tobacco. She reports that she does not drink alcohol or use illicit drugs.  ALLERGIES: Hydrocodone; Aspirin; and Penicillins  MEDICATIONS:  Current Outpatient Prescriptions  Medication Sig Dispense Refill  . acidophilus (RISAQUAD) CAPS capsule Take 1 capsule by mouth daily.    Marland Kitchen amLODipine (NORVASC) 5 MG tablet Take 1 tablet (5 mg total) by mouth every morning. 30 tablet 4  . Cyanocobalamin (VITAMIN B-12 PO) Take by mouth.    . dexamethasone (DECADRON) 4 MG tablet Take 2 tablets (8 mg total) by mouth 3 (three) times daily. 180 tablet 4  . Diphenhydramine-APAP, sleep, (HEADACHE RELIEF PM) 38-500 MG TABS Take 2 tablets by mouth daily as needed (For headaches.).     Marland Kitchen escitalopram (LEXAPRO) 20 MG tablet Take 1 tablet (20 mg total) by mouth every morning. 30 tablet 4  . lidocaine-prilocaine (EMLA) cream Apply 1 application topically as needed. Apply quarter sized amount to portacath site at least one hour prior to treatment .  Cover with saran wrap 30 g 0  . lisinopril (PRINIVIL,ZESTRIL) 40 MG tablet Take 1 tablet (40 mg total) by mouth every morning. 30 tablet 4  . LORazepam (ATIVAN) 0.5 MG tablet Take 1 tablet (0.5 mg total) by mouth every 6 (six) hours as needed (Nausea or vomiting). 30 tablet 0  . Multiple Vitamin (MULTIVITAMIN WITH MINERALS) TABS tablet Take 1 tablet  by mouth daily.    . ondansetron (ZOFRAN) 8 MG tablet Take 1 tablet (8 mg total) by mouth 2 (two) times daily. Start the day after chemo for 3 days. Then take as needed for nausea or vomiting. 30 tablet 1  . oxyCODONE (OXY IR/ROXICODONE) 5 MG immediate release tablet Take 1-2 if needed for pain due to cancer. 120 tablet 0  .  pantoprazole (PROTONIX) 40 MG tablet Take 1 tablet (40 mg total) by mouth daily. 30 tablet 4  . PRESCRIPTION MEDICATION Chemo at [facility] 01/22/2015.    Marland Kitchen prochlorperazine (COMPAZINE) 10 MG tablet Take 1 tablet (10 mg total) by mouth every 6 (six) hours as needed (Nausea or vomiting). 30 tablet 1  . SUMAtriptan (IMITREX) 100 MG tablet Take 1 tablet (100 mg total) by mouth every 2 (two) hours as needed for migraine. 10 tablet 3  . fluconazole (DIFLUCAN) 100 MG tablet Take 1 tablet (100 mg total) by mouth daily. (Patient not taking: Reported on 03/04/2015) 30 tablet 11  . potassium chloride SA (K-DUR,KLOR-CON) 20 MEQ tablet Take 1 tablet (20 mEq total) by mouth 1 day or 1 dose. (Patient not taking: Reported on 03/04/2015) 30 tablet 0   No current facility-administered medications for this encounter.    REVIEW OF SYSTEMS:  A 15 point review of systems is documented in the electronic medical record. This was obtained by the nursing staff. However, I reviewed this with the patient to discuss relevant findings and make appropriate changes.  Pertinent items are noted in HPI.   PHYSICAL EXAM:  height is _0  (1.626 m) and weight is 146 lb (66.225 kg). Her oral temperature is 97.8 F (36.6 C). Her blood pressure is 150/93 and her pulse is 89. Her respiration is 16 and oxygen saturation is 100%.   The patient is nervous but in no acute distress today. She is alert and oriented to person time place and situation. Her speech is fluent articulate. Motor strength is intact throughout gait is normal.  KPS = 90  100 - Normal; no complaints; no evidence of disease. 90   - Able to carry on normal activity; minor signs or symptoms of disease. 80   - Normal activity with effort; some signs or symptoms of disease. 74   - Cares for self; unable to carry on normal activity or to do active work. 60   - Requires occasional assistance, but is able to care for most of his personal needs. 50   - Requires considerable assistance  and frequent medical care. 81   - Disabled; requires special care and assistance. 30   - Severely disabled; hospital admission is indicated although death not imminent. 82   - Very sick; hospital admission necessary; active supportive treatment necessary. 10   - Moribund; fatal processes progressing rapidly. 0     - Dead  Karnofsky DA, [place], Craver LS and Burchenal [facility] [phone]) The use of the nitrogen mustards in the palliative treatment of carcinoma: with particular reference to bronchogenic carcinoma Cancer 1 634-56  LABORATORY DATA:  Lab Results  Component Value Date   WBC 2.0* 02/28/2015   HGB 11.4* 02/28/2015   HCT 33.8* 02/28/2015   MCV 95 02/28/2015   PLT 186 02/28/2015   Lab Results  Component Value Date   NA 135 02/28/2015   K 3.6 02/28/2015   CL 103 02/28/2015   CO2 26 02/28/2015   Lab Results  Component Value Date   ALT 141* 02/28/2015   AST 92*  02/28/2015   ALKPHOS 273* 02/28/2015   BILITOT 0.80 02/28/2015     RADIOGRAPHY: Ct Head Wo Contrast  02/19/2015   CLINICAL DATA:  Patient is standing on top of quality changing a light bulb, lost balance and fell. Large hematoma to the left forehead and eye. Laceration to the right eye. Loss of balance recently. History of breast cancer on chemotherapy.  EXAM: CT HEAD WITHOUT CONTRAST  CT MAXILLOFACIAL WITHOUT CONTRAST  TECHNIQUE: Multidetector CT imaging of the head and maxillofacial structures were performed using the standard protocol without intravenous contrast. Multiplanar CT image reconstructions of the maxillofacial structures were also generated.  COMPARISON:  MRI brain 04/10/2014  FINDINGS: CT HEAD FINDINGS  Multiple low-attenuation lesions with increased density consistent with hemorrhages demonstrated throughout the cerebral and cerebellar hemispheres bilaterally. Largest lesions measure up to about 18 mm diameter. These are consistent with multiple hemorrhagic metastases. No significant mass effect or midline  shift. No ventricular dilatation. No acute subdural or subarachnoid hematoma. Gray-white matter junctions are distinct. Basal cisterns are not effaced. Large central and right anterior frontal subcutaneous scalp hematoma. Calvarium appears intact. No depressed fractures. Mucosal thickening in the paranasal sinuses. Mastoid air cells are not opacified.  CT MAXILLOFACIAL FINDINGS  The globes and extraocular muscles appear intact and symmetrical. Right periorbital soft tissue hematoma. No retrobulbar involvement. Diffuse mucosal thickening in the paranasal sinuses. No acute air-fluid levels. Orbital and facial bones, nasal bones, mandibles, and temporomandibular joints appear intact. No displaced acute fractures are demonstrated. Periapical lucencies in multiple upper and lower teeth consistent with periodontal disease.  IMPRESSION: Multiple hemorrhagic intracranial metastases. This represents significant progression of disease since the previous MRI. Central and right frontal scalp hematoma. Right periorbital soft tissue hematoma. No facial bone fractures identified.   Electronically Signed   By: Lucienne Capers M.D.   On: 02/19/2015 21:17   Ct Chest W Contrast  02/18/2015   CLINICAL DATA:  Left breast cancer, last chemotherapy 3 weeks ago, hysterectomy.  EXAM: CT CHEST, ABDOMEN, AND PELVIS WITH CONTRAST  TECHNIQUE: Multidetector CT imaging of the chest, abdomen and pelvis was performed following the standard protocol during bolus administration of intravenous contrast.  CONTRAST:  118m OMNIPAQUE IOHEXOL 300 MG/ML  SOLN  COMPARISON:  11/29/2014.  FINDINGS: CT CHEST FINDINGS  CT CHEST FINDINGS  Mediastinum/Nodes: Low-attenuation lesions in the thyroid measure up to approximately 9 mm, as before. Left IJ Port-A-Cath terminates in the low SVC. Mediastinal lymph nodes measure up to 1.1 cm anterior to the right mainstem bronchus, stable. No hilar or left axillary adenopathy. Right axillary lymph nodes measure up to  1.4 cm in short axis (image 24), stable. There are adjacent cutaneous nodular lesions measuring approximately 1.8 x 3.6 cm (image 29), grossly stable. Asymmetric enlargement of the right breast with marked skin thickening and edematous changes in the subcutaneous fat. Findings are similar to 11/29/2014. Heart size normal. No pericardial effusion.  Lungs/Pleura: There are new and enlarging bilateral pulmonary nodules, seen in a hematogenous distribution. Index subpleural lingular nodule measures 9 x 10 mm (series 4, image 33), previously 5 x 6 mm. Moderate bilateral effusions, slightly decreased in size. Minimal compressive atelectasis in both lower lobes. Airway is unremarkable.  Musculoskeletal: Scattered sclerotic foci in the spine are grossly stable. Old left eleventh rib fracture. There is sclerosis and irregularity involving the majority of the left eighth rib, as before. Bilateral cervical ribs are incidentally noted.  CT ABDOMEN AND PELVIS FINDINGS  Hepatobiliary: Irregular low-attenuation lesions in the liver have  regressed slightly. Index left hepatic lobe lesion measures approximately 2.2 x 3.8 cm (series 2, image 51), previously 4.4 x 4.8 cm. Slight irregularity of the liver margin is indicative of post treatment fibrosis. Gallbladder is unremarkable. No biliary ductal dilatation.  Pancreas: Negative.  Spleen: Negative.  Adrenals/Urinary Tract: Right adrenal gland is unremarkable. There may be slight thickening of in the medial limb left adrenal gland, unchanged. 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 3 mm low-attenuation lesion in the lower pole left kidney is unchanged. Ureters are decompressed. Bladder is grossly unremarkable.  Stomach/Bowel: Stomach, small bowel and appendix are unremarkable. A fair amount of stool is seen in the colon.  Vascular/Lymphatic: Atherosclerotic calcification of the arterial vasculature without abdominal aortic aneurysm. 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 No pathologically enlarged lymph nodes.  Reproductive: Hysterectomy.  Ovaries are visualized.  Other: There are peritoneal nodules in the right abdomen, inferior to the left and right hepatic lobes. Index nodule inferior to the right hepatic lobe measures 10 mm (series 2, image 75), new. Tiny pelvic free fluid.  Musculoskeletal: Mixed lytic and sclerotic lesions in the spine and pelvis are grossly unchanged. Lytic lesion in the right femoral neck is again seen and without evidence of pathologic fracture.  IMPRESSION: 1. Interval mixed response to therapy as evidenced by new and enlarging pulmonary nodules, slight regression of hepatic metastatic disease and new peritoneal nodules. Osseous metastatic disease appears grossly stable. 2. Malignant and/or posttreatment changes in the right breast with stable right axillary lymph nodes and right axillary cutaneous nodules. 3. Moderate bilateral effusions, decreased. 4. Tiny pelvic free fluid.   Electronically Signed   By: Lorin Picket M.D.   On: 02/18/2015 10:49   Mr Jeri Cos GN Contrast  03/01/2015   CLINICAL DATA:  Breast cancer.  Hemorrhagic metastases.  EXAM: MRI HEAD WITHOUT AND WITH CONTRAST  TECHNIQUE: Multiplanar, multiecho pulse sequences of the brain and surrounding structures were obtained without and with intravenous contrast.  CONTRAST:  27m MULTIHANCE GADOBENATE DIMEGLUMINE 529 MG/ML IV SOLN  COMPARISON:  CT head without contrast 02/19/2015. MRI brain 04/10/2014.  FINDINGS: MRI a reveals many more hemorrhagic lesions throughout the brain and more evident by CT. There are innumerable lesions within both cerebellar hemispheres. The largest right-sided lesion extends to the tentorium and measures 16 x 13 x 14 mm. A similarly position lesion on the left demonstrates a more peripheral enhancement pattern with some layering blood products. It measures 20 x 17 x 16 mm. Lower 50% of the  enhancing lesions demonstrate blood products.  The largest right supratentorial lesion is in the right frontal operculum, adjacent to the insula in sylvian fissure. This heterogeneously enhancing lesion demonstrates central T1 shortening suggesting acute hemorrhage as well as layering blood products. It measures 2.2 x 2.3 x 2.1 cm and demonstrates significant surrounding vasogenic edema.  The lesion in the posterior medial left frontal and parietal lobe measures 18 x 18 x 18 mm. There layering blood products. A lesion with significant dural involvement in surrounding vasogenic edema within the anterior right frontal lobe measures 16 x 10 x 22 mm.  Vasogenic edema is most associated with the larger lesions and specifically those measured.  Flow is present in the major intracranial arteries. The globes and orbits are intact.  Circumferential mucosal thickening is present in the left maxillary sinus. The remaining paranasal sinuses and the mastoid air cells are clear.  IMPRESSION: 1. Numerous enhancing  mass lesions throughout both cerebral hemispheres and cerebellum, compatible with metastases. Over 50% of these lesions demonstrate hemorrhage. 2. There fluid fluid levels compatible with layering blood products can several of the largest lesions. Surrounding vasogenic edema is associated with several of the larger lesions. The mass effect is local without evidence for herniation. 3. The largest lesions having measured as above. 4. Left maxillary sinus disease.   Electronically Signed   By: San Morelle M.D.   On: 03/01/2015 11:23   Ct Abdomen Pelvis W Contrast  02/18/2015   CLINICAL DATA:  Left breast cancer, last chemotherapy 3 weeks ago, hysterectomy.  EXAM: CT CHEST, ABDOMEN, AND PELVIS WITH CONTRAST  TECHNIQUE: Multidetector CT imaging of the chest, abdomen and pelvis was performed following the standard protocol during bolus administration of intravenous contrast.  CONTRAST:  130m OMNIPAQUE IOHEXOL 300  MG/ML  SOLN  COMPARISON:  11/29/2014.  FINDINGS: CT CHEST FINDINGS  CT CHEST FINDINGS  Mediastinum/Nodes: Low-attenuation lesions in the thyroid measure up to approximately 9 mm, as before. Left IJ Port-A-Cath terminates in the low SVC. Mediastinal lymph nodes measure up to 1.1 cm anterior to the right mainstem bronchus, stable. No hilar or left axillary adenopathy. Right axillary lymph nodes measure up to 1.4 cm in short axis (image 24), stable. There are adjacent cutaneous nodular lesions measuring approximately 1.8 x 3.6 cm (image 29), grossly stable. Asymmetric enlargement of the right breast with marked skin thickening and edematous changes in the subcutaneous fat. Findings are similar to 11/29/2014. Heart size normal. No pericardial effusion.  Lungs/Pleura: There are new and enlarging bilateral pulmonary nodules, seen in a hematogenous distribution. Index subpleural lingular nodule measures 9 x 10 mm (series 4, image 33), previously 5 x 6 mm. Moderate bilateral effusions, slightly decreased in size. Minimal compressive atelectasis in both lower lobes. Airway is unremarkable.  Musculoskeletal: Scattered sclerotic foci in the spine are grossly stable. Old left eleventh rib fracture. There is sclerosis and irregularity involving the majority of the left eighth rib, as before. Bilateral cervical ribs are incidentally noted.  CT ABDOMEN AND PELVIS FINDINGS  Hepatobiliary: Irregular low-attenuation lesions in the liver have regressed slightly. Index left hepatic lobe lesion measures approximately 2.2 x 3.8 cm (series 2, image 51), previously 4.4 x 4.8 cm. Slight irregularity of the liver margin is indicative of post treatment fibrosis. Gallbladder is unremarkable. No biliary ductal dilatation.  Pancreas: Negative.  Spleen: Negative.  Adrenals/Urinary Tract: Right adrenal gland is unremarkable. There may be slight thickening of in the medial limb left adrenal gland, unchanged. A 7 mm fat density angiomyolipoma is  seen in the interpolar right kidney. 3 mm low-attenuation lesion in the lower pole left kidney is unchanged. Ureters are decompressed. Bladder is grossly unremarkable.  Stomach/Bowel: Stomach, small bowel and appendix are unremarkable. A fair amount of stool is seen in the colon.  Vascular/Lymphatic: Atherosclerotic calcification of the arterial vasculature without abdominal aortic aneurysm. 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 No pathologically enlarged lymph nodes.  Reproductive: Hysterectomy.  Ovaries are visualized.  Other: There are peritoneal nodules in the right abdomen, inferior to the left and right hepatic lobes. Index nodule inferior to the right hepatic lobe measures 10 mm (series 2, image 75), new. Tiny pelvic free fluid.  Musculoskeletal: Mixed lytic and sclerotic lesions in the spine and pelvis are grossly unchanged. Lytic lesion in the right femoral neck is again seen and without evidence of pathologic fracture.  IMPRESSION: 1. Interval mixed response to therapy  as evidenced by new and enlarging pulmonary nodules, slight regression of hepatic metastatic disease and new peritoneal nodules. Osseous metastatic disease appears grossly stable. 2. Malignant and/or posttreatment changes in the right breast with stable right axillary lymph nodes and right axillary cutaneous nodules. 3. Moderate bilateral effusions, decreased. 4. Tiny pelvic free fluid.   Electronically Signed   By: Lorin Picket M.D.   On: 02/18/2015 10:49   Ct Maxillofacial Wo Cm  02/19/2015   CLINICAL DATA:  Patient is standing on top of quality changing a light bulb, lost balance and fell. Large hematoma to the left forehead and eye. Laceration to the right eye. Loss of balance recently. History of breast cancer on chemotherapy.  EXAM: CT HEAD WITHOUT CONTRAST  CT MAXILLOFACIAL WITHOUT CONTRAST  TECHNIQUE: Multidetector CT imaging of the head and maxillofacial structures were performed using  the standard protocol without intravenous contrast. Multiplanar CT image reconstructions of the maxillofacial structures were also generated.  COMPARISON:  MRI brain 04/10/2014  FINDINGS: CT HEAD FINDINGS  Multiple low-attenuation lesions with increased density consistent with hemorrhages demonstrated throughout the cerebral and cerebellar hemispheres bilaterally. Largest lesions measure up to about 18 mm diameter. These are consistent with multiple hemorrhagic metastases. No significant mass effect or midline shift. No ventricular dilatation. No acute subdural or subarachnoid hematoma. Gray-white matter junctions are distinct. Basal cisterns are not effaced. Large central and right anterior frontal subcutaneous scalp hematoma. Calvarium appears intact. No depressed fractures. Mucosal thickening in the paranasal sinuses. Mastoid air cells are not opacified.  CT MAXILLOFACIAL FINDINGS  The globes and extraocular muscles appear intact and symmetrical. Right periorbital soft tissue hematoma. No retrobulbar involvement. Diffuse mucosal thickening in the paranasal sinuses. No acute air-fluid levels. Orbital and facial bones, nasal bones, mandibles, and temporomandibular joints appear intact. No displaced acute fractures are demonstrated. Periapical lucencies in multiple upper and lower teeth consistent with periodontal disease.  IMPRESSION: Multiple hemorrhagic intracranial metastases. This represents significant progression of disease since the previous MRI. Central and right frontal scalp hematoma. Right periorbital soft tissue hematoma. No facial bone fractures identified.   Electronically Signed   By: Lucienne Capers M.D.   On: 02/19/2015 21:17      IMPRESSION: This patient is a very nice 59 year old woman with stage IV ER positive HER-2 positive breast cancer.  She has been receiving systemic therapy with mixed response and she has developed at least 36 new brain metastases. She would potentially be eligible for  whole brain radiotherapy for palliation and prolongation of survival.  PLAN:Today, I talked to the patient and family about the findings and work-up thus far.  We discussed the natural history of brain metastases and general treatment, highlighting the role of radiotherapy in the management.  We discussed the available radiation techniques, and focused on the details of logistics and delivery.  We reviewed the anticipated acute and late sequelae associated with radiation in this setting.  The patient was encouraged to ask questions that I answered to the best of my ability.  I filled out a patient counseling form during our discussion including treatment diagrams.  We retained a copy for our records.  The patient would like to proceed with radiation and will be scheduled for CT simulation.  I spent 60 minutes minutes face to face with the patient and more than 50% of that time was spent in counseling and/or coordination of care.   ------------------------------------------------  Sheral Apley. Tammi Klippel, M.D.

## 2015-03-04 ENCOUNTER — Ambulatory Visit: Payer: Medicaid Other

## 2015-03-04 ENCOUNTER — Ambulatory Visit
Admission: RE | Admit: 2015-03-04 | Discharge: 2015-03-04 | Disposition: A | Discharge status: Home / Self Care | Payer: Medicaid Other | Source: Ambulatory Visit | Attending: Radiation Oncology | Admitting: Radiation Oncology

## 2015-03-04 ENCOUNTER — Encounter: Payer: Self-pay | Admitting: Radiation Oncology

## 2015-03-04 VITALS — BP 150/93 | HR 89 | Temp 97.8°F | Resp 16 | Ht 64.0 in | Wt 146.0 lb

## 2015-03-04 DIAGNOSIS — F329 Major depressive disorder, single episode, unspecified: Secondary | ICD-10-CM | POA: Insufficient documentation

## 2015-03-04 DIAGNOSIS — I10 Essential (primary) hypertension: Secondary | ICD-10-CM | POA: Diagnosis not present

## 2015-03-04 DIAGNOSIS — C7931 Secondary malignant neoplasm of brain: Secondary | ICD-10-CM

## 2015-03-04 DIAGNOSIS — Z7952 Long term (current) use of systemic steroids: Secondary | ICD-10-CM | POA: Insufficient documentation

## 2015-03-04 DIAGNOSIS — Z510 Encounter for antineoplastic radiation therapy: Secondary | ICD-10-CM | POA: Insufficient documentation

## 2015-03-04 DIAGNOSIS — Z170 Estrogen receptor positive status [ER+]: Secondary | ICD-10-CM | POA: Diagnosis not present

## 2015-03-04 DIAGNOSIS — Z79899 Other long term (current) drug therapy: Secondary | ICD-10-CM | POA: Insufficient documentation

## 2015-03-04 DIAGNOSIS — C50411 Malignant neoplasm of upper-outer quadrant of right female breast: Secondary | ICD-10-CM | POA: Diagnosis not present

## 2015-03-04 DIAGNOSIS — K259 Gastric ulcer, unspecified as acute or chronic, without hemorrhage or perforation: Secondary | ICD-10-CM | POA: Insufficient documentation

## 2015-03-04 DIAGNOSIS — C50919 Malignant neoplasm of unspecified site of unspecified female breast: Secondary | ICD-10-CM

## 2015-03-04 NOTE — Progress Notes (Signed)
  Radiation Oncology         [phone] ________________________________  Name: Mary [name] MRN: [id number]  Date: 03/04/2015  DOB: 03-16-1956  SIMULATION AND TREATMENT PLANNING NOTE    ICD-9-CM ICD-10-CM   1. over 10 brain metastases, several showing hemorrhage 198.3 C79.31     DIAGNOSIS:  59 year old woman with ER positive HER-2 positive metastatic breast cancer and at least 62 brain metastases-stage IV  NARRATIVE:  The patient was brought to the [facility].  Identity was confirmed.  All relevant records and images related to the planned course of therapy were reviewed.  The patient freely provided informed written consent to proceed with treatment after reviewing the details related to the planned course of therapy. The consent form was witnessed and verified by the simulation staff.  Then, the patient was set-up in a stable reproducible  supine position for radiation therapy.  CT images were obtained.  Surface markings were placed.  The CT images were loaded into the planning software.  Then the target and avoidance structures were contoured.  Treatment planning then occurred.  The radiation prescription was entered and confirmed.  Then, I designed and supervised the construction of a total of 3 medically necessary complex treatment devices, including a custom made thermoplastic mask used for immobilization and two complex multileaf collimators to cover the entire intracranial contents, while shielding the eyes and face.  Each [facility] is independently created to account for beam divergence.  The right and left lateral fields will be treated with 6 MV X-rays.  I have requested : Isodose Plan.    PLAN:  The whole brain will be treated to 35 Gy in 14 fractions.  ________________________________  Sheral Apley Tammi Klippel, M.D.

## 2015-03-04 NOTE — Progress Notes (Signed)
Reports taking decadron 8 mg bid. Denies seizure activity. Reports she has struggled with migraines since she was a teenager. Reports intermittent severe headaches for which she takes imitrex or oxycodone 5 mg for relief. Denies nausea or vomiting. Reports occasional dizziness. Denies difficulty with hand coordination. Denies visual changes. Reports one or two episodes of confusion. Speech fluid and appropriate. Reports having a runny nose and tearing persistently. 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 Reports he fell in the ED and learned she had brain mets. Reports appetite is slowly improving.

## 2015-03-04 NOTE — Progress Notes (Signed)
See progress note under physician encounter. 

## 2015-03-05 ENCOUNTER — Ambulatory Visit: Payer: Medicaid Other

## 2015-03-05 ENCOUNTER — Telehealth: Payer: Self-pay | Admitting: Radiation Oncology

## 2015-03-05 ENCOUNTER — Ambulatory Visit
Admission: RE | Admit: 2015-03-05 | Discharge: 2015-03-05 | Disposition: A | Discharge status: Home / Self Care | Payer: Medicaid Other | Source: Ambulatory Visit | Attending: Radiation Oncology | Admitting: Radiation Oncology

## 2015-03-05 ENCOUNTER — Ambulatory Visit: Payer: Medicaid Other | Admitting: Hematology & Oncology

## 2015-03-05 ENCOUNTER — Other Ambulatory Visit: Payer: Medicaid Other | Admitting: Lab

## 2015-03-05 DIAGNOSIS — Z510 Encounter for antineoplastic radiation therapy: Secondary | ICD-10-CM | POA: Diagnosis not present

## 2015-03-05 NOTE — Telephone Encounter (Signed)
Opened in error

## 2015-03-05 NOTE — Addendum Note (Signed)
Encounter addended by: Heywood Footman, RN on: 03/05/2015 12:18 PM<BR>     Documentation filed: Notes Section

## 2015-03-05 NOTE — Progress Notes (Signed)
Understand from [name] that appointments need to be made for treatment before the 1100 hour. Patient's husband has to be to work by [employer] and he is the only mode of transportation. Explained this to Mary [name] in CT/SIM. Fortunately, she was able to arrange all appointments for the early morning hour.

## 2015-03-06 ENCOUNTER — Encounter: Payer: Self-pay | Admitting: Radiation Oncology

## 2015-03-06 ENCOUNTER — Ambulatory Visit
Admission: RE | Admit: 2015-03-06 | Discharge: 2015-03-06 | Disposition: A | Discharge status: Home / Self Care | Payer: Medicaid Other | Source: Ambulatory Visit | Attending: Radiation Oncology | Admitting: Radiation Oncology

## 2015-03-06 DIAGNOSIS — Z510 Encounter for antineoplastic radiation therapy: Secondary | ICD-10-CM | POA: Diagnosis not present

## 2015-03-06 NOTE — Progress Notes (Signed)
  Radiation Oncology         [phone] ________________________________  Name: Mary [name] MRN: [id number]  Date:03/05/15  DOB: Mar 31, 1956  Simulation Verification Note   Status: outpatient  NARRATIVE: The patient was brought to the treatment unit and placed in the planned treatment position. The clinical setup was verified. Then port films were obtained and uploaded to the radiation oncology medical record software.  The treatment beams were carefully compared against the planned radiation fields. The position location and shape of the radiation fields was reviewed. They targeted volume of tissue appears to be appropriately covered by the radiation beams. Organs at risk appear to be excluded as planned.  Based on my personal review, I approved the simulation verification. The patient's treatment will proceed as planned.  -----------------------------------  Blair Promise, PhD, MD

## 2015-03-07 ENCOUNTER — Ambulatory Visit
Admission: RE | Admit: 2015-03-07 | Discharge: 2015-03-07 | Disposition: A | Discharge status: Home / Self Care | Payer: Medicaid Other | Source: Ambulatory Visit | Attending: Radiation Oncology | Admitting: Radiation Oncology

## 2015-03-07 ENCOUNTER — Encounter: Payer: Self-pay | Admitting: Radiation Oncology

## 2015-03-07 VITALS — BP 154/84 | HR 71 | Resp 16 | Wt 144.0 lb

## 2015-03-07 DIAGNOSIS — Z510 Encounter for antineoplastic radiation therapy: Secondary | ICD-10-CM | POA: Diagnosis not present

## 2015-03-07 DIAGNOSIS — C50919 Malignant neoplasm of unspecified site of unspecified female breast: Secondary | ICD-10-CM

## 2015-03-07 DIAGNOSIS — C7931 Secondary malignant neoplasm of brain: Secondary | ICD-10-CM | POA: Insufficient documentation

## 2015-03-07 MED ORDER — BIAFINE EX EMUL
Freq: Every day | CUTANEOUS | Status: DC
  Administered 2015-03-07: 10:00:00 via TOPICAL

## 2015-03-07 MED ORDER — OXYCODONE HCL 5 MG PO TABS: 5.0000 mg | ORAL_TABLET | Freq: Four times a day (QID) | ORAL | Status: DC | PRN

## 2015-03-07 NOTE — Addendum Note (Signed)
Encounter addended by: Heywood Footman, RN on: 03/07/2015 10:20 AM<BR>     Documentation filed: Chief Complaint Section, Medications, Notes Section, Orders, Dx Association, Inpatient Patient Education, Inpatient Document Flowsheet, Inpatient [facility]

## 2015-03-07 NOTE — Progress Notes (Signed)
0954. Oriented patient and her husband to staff and routine of the clinic. Provided patient with RADIATION THERAPY AND YOU handbook then, reviewed pertinent information. Educated patient reference potential side effects and management such as, headache, nausea, and fatigue. Provided patient with BIAFINE cream then, directed upon use. Patient verbalized understanding of all reviewed. Allowed patient the opportunity to ask questions and answered those to the best of my ability. Patient understands to contact this RN with future needs.

## 2015-03-07 NOTE — Progress Notes (Signed)
Taking decadron 8 mg tid. Requesting refill of oxycodone 5 mg for frequent intense headaches. Denise nausea or vomiting. Reports occasional dizziness. Denies diplopia or ringing in the ears.   Post sim completed.

## 2015-03-07 NOTE — Progress Notes (Signed)
  Radiation Oncology         [phone] ________________________________  Name: Mary [name] MRN: [id number]  Date: 03/07/2015  DOB: 10-Apr-1956  Weekly Radiation Therapy Management    ICD-9-CM ICD-10-CM   2. over 75 brain metastases, several showing hemorrhage 198.3 C79.31     Current Dose: 7.5 Gy     Planned Dose:  35 Gy  Narrative . . . . . . . . The patient presents for routine under treatment assessment.                                   The patient is without complaint.                                 Set-up films were reviewed.                                 The chart was checked. Physical Findings. . .  weight is 144 lb (65.318 kg). Her blood pressure is 154/84 and her pulse is 71. Her respiration is 16 and oxygen saturation is 100%. . Weight essentially stable.  No significant changes. Impression . . . . . . . The patient is tolerating radiation. Plan . . . . . . . . . . . . Continue treatment as planned.  ________________________________  Sheral Apley. Tammi Klippel, M.D.

## 2015-03-08 ENCOUNTER — Ambulatory Visit
Admission: RE | Admit: 2015-03-08 | Discharge: 2015-03-08 | Disposition: A | Discharge status: Home / Self Care | Payer: Medicaid Other | Source: Ambulatory Visit | Attending: Radiation Oncology | Admitting: Radiation Oncology

## 2015-03-08 DIAGNOSIS — Z510 Encounter for antineoplastic radiation therapy: Secondary | ICD-10-CM | POA: Diagnosis not present

## 2015-03-11 ENCOUNTER — Ambulatory Visit
Admission: RE | Admit: 2015-03-11 | Discharge: 2015-03-11 | Disposition: A | Discharge status: Home / Self Care | Payer: Medicaid Other | Source: Ambulatory Visit | Attending: Radiation Oncology | Admitting: Radiation Oncology

## 2015-03-11 DIAGNOSIS — Z510 Encounter for antineoplastic radiation therapy: Secondary | ICD-10-CM | POA: Diagnosis not present

## 2015-03-12 ENCOUNTER — Ambulatory Visit
Admission: RE | Admit: 2015-03-12 | Discharge: 2015-03-12 | Disposition: A | Discharge status: Home / Self Care | Payer: Medicaid Other | Source: Ambulatory Visit | Attending: Radiation Oncology | Admitting: Radiation Oncology

## 2015-03-12 DIAGNOSIS — Z510 Encounter for antineoplastic radiation therapy: Secondary | ICD-10-CM | POA: Diagnosis not present

## 2015-03-13 ENCOUNTER — Ambulatory Visit
Admission: RE | Admit: 2015-03-13 | Discharge: 2015-03-13 | Disposition: A | Discharge status: Home / Self Care | Payer: Medicaid Other | Source: Ambulatory Visit | Attending: Radiation Oncology | Admitting: Radiation Oncology

## 2015-03-13 DIAGNOSIS — Z510 Encounter for antineoplastic radiation therapy: Secondary | ICD-10-CM | POA: Diagnosis not present

## 2015-03-14 ENCOUNTER — Ambulatory Visit (HOSPITAL_BASED_OUTPATIENT_CLINIC_OR_DEPARTMENT_OTHER): Payer: Medicaid Other

## 2015-03-14 ENCOUNTER — Ambulatory Visit
Admission: RE | Admit: 2015-03-14 | Discharge: 2015-03-14 | Disposition: A | Discharge status: Home / Self Care | Payer: Medicaid Other | Source: Ambulatory Visit | Attending: Radiation Oncology | Admitting: Radiation Oncology

## 2015-03-14 ENCOUNTER — Other Ambulatory Visit (HOSPITAL_BASED_OUTPATIENT_CLINIC_OR_DEPARTMENT_OTHER): Payer: Medicaid Other | Admitting: Lab

## 2015-03-14 ENCOUNTER — Other Ambulatory Visit: Payer: Self-pay | Admitting: Nurse Practitioner

## 2015-03-14 ENCOUNTER — Encounter: Payer: Self-pay | Admitting: Family

## 2015-03-14 ENCOUNTER — Ambulatory Visit (HOSPITAL_BASED_OUTPATIENT_CLINIC_OR_DEPARTMENT_OTHER): Payer: Medicaid Other | Admitting: Family

## 2015-03-14 DIAGNOSIS — Z170 Estrogen receptor positive status [ER+]: Secondary | ICD-10-CM

## 2015-03-14 DIAGNOSIS — C50911 Malignant neoplasm of unspecified site of right female breast: Secondary | ICD-10-CM

## 2015-03-14 DIAGNOSIS — Z510 Encounter for antineoplastic radiation therapy: Secondary | ICD-10-CM | POA: Diagnosis not present

## 2015-03-14 DIAGNOSIS — C50919 Malignant neoplasm of unspecified site of unspecified female breast: Secondary | ICD-10-CM

## 2015-03-14 DIAGNOSIS — Z5112 Encounter for antineoplastic immunotherapy: Secondary | ICD-10-CM

## 2015-03-14 DIAGNOSIS — C799 Secondary malignant neoplasm of unspecified site: Secondary | ICD-10-CM

## 2015-03-14 DIAGNOSIS — C7931 Secondary malignant neoplasm of brain: Secondary | ICD-10-CM

## 2015-03-14 DIAGNOSIS — D509 Iron deficiency anemia, unspecified: Secondary | ICD-10-CM

## 2015-03-14 LAB — CBC WITH DIFFERENTIAL (CANCER CENTER ONLY)
BASO#: 0 10*3/uL (ref 0.0–0.2)
BASO%: 0.3 % (ref 0.0–2.0)
EOS%: 0 % (ref 0.0–7.0)
Eosinophils Absolute: 0 10*3/uL (ref 0.0–0.5)
HCT: 34.5 % — ABNORMAL LOW (ref 34.8–46.6)
HEMOGLOBIN: 11.4 g/dL — AB (ref 11.6–15.9)
LYMPH#: 1.5 10*3/uL (ref 0.9–3.3)
LYMPH%: 12.5 % — AB (ref 14.0–48.0)
MCH: 31.9 pg (ref 26.0–34.0)
MCHC: 33 g/dL (ref 32.0–36.0)
MCV: 97 fL (ref 81–101)
MONO#: 1.5 10*3/uL — AB (ref 0.1–0.9)
MONO%: 12.3 % (ref 0.0–13.0)
NEUT#: 8.8 10*3/uL — ABNORMAL HIGH (ref 1.5–6.5)
NEUT%: 74.9 % (ref 39.6–80.0)
Platelets: 264 10*3/uL (ref 145–400)
RBC: 3.57 10*6/uL — AB (ref 3.70–5.32)
RDW: 16.9 % — ABNORMAL HIGH (ref 11.1–15.7)
WBC: 11.8 10*3/uL — ABNORMAL HIGH (ref 3.9–10.0)

## 2015-03-14 LAB — CMP (CANCER CENTER ONLY)
ALK PHOS: 195 U/L — AB (ref 26–84)
ALT(SGPT): 82 U/L — ABNORMAL HIGH (ref 10–47)
AST: 63 U/L — ABNORMAL HIGH (ref 11–38)
Albumin: 3.1 g/dL — ABNORMAL LOW (ref 3.3–5.5)
BUN, Bld: 14 mg/dL (ref 7–22)
CHLORIDE: 107 meq/L (ref 98–108)
CO2: 30 mEq/L (ref 18–33)
CREATININE: 0.7 mg/dL (ref 0.6–1.2)
Calcium: 8.4 mg/dL (ref 8.0–10.3)
Glucose, Bld: 108 mg/dL (ref 73–118)
Potassium: 3.6 mEq/L (ref 3.3–4.7)
Sodium: 138 mEq/L (ref 128–145)
Total Bilirubin: 0.6 mg/dl (ref 0.20–1.60)
Total Protein: 6.2 g/dL — ABNORMAL LOW (ref 6.4–8.1)

## 2015-03-14 MED ORDER — ACETAMINOPHEN 325 MG PO TABS
650.0000 mg | ORAL_TABLET | Freq: Once | ORAL | Status: AC
  Administered 2015-03-14: 650 mg via ORAL

## 2015-03-14 MED ORDER — FLUCONAZOLE 100 MG PO TABS: 100.0000 mg | ORAL_TABLET | Freq: Every day | ORAL | Status: DC

## 2015-03-14 MED ORDER — TRASTUZUMAB CHEMO INJECTION 440 MG
6.0000 mg/kg | Freq: Once | INTRAVENOUS | Status: AC
  Administered 2015-03-14: 399 mg via INTRAVENOUS
  Filled 2015-03-14: qty 19

## 2015-03-14 MED ORDER — AMLODIPINE BESYLATE 5 MG PO TABS: 5.0000 mg | ORAL_TABLET | Freq: Every morning | ORAL | Status: DC

## 2015-03-14 MED ORDER — SODIUM CHLORIDE 0.9 % IV SOLN
420.0000 mg | Freq: Once | INTRAVENOUS | Status: AC
  Administered 2015-03-14: 420 mg via INTRAVENOUS
  Filled 2015-03-14: qty 14

## 2015-03-14 MED ORDER — SODIUM CHLORIDE 0.9 % IV SOLN: Freq: Once | INTRAVENOUS | Status: AC

## 2015-03-14 MED ORDER — VITAMIN B-6 100 MG PO TABS: 200.0000 mg | ORAL_TABLET | Freq: Every day | ORAL | Status: DC

## 2015-03-14 MED ORDER — SODIUM CHLORIDE 0.9 % IJ SOLN
10.0000 mL | INTRAMUSCULAR | Status: DC | PRN
  Administered 2015-03-14: 10 mL
  Filled 2015-03-14: qty 10

## 2015-03-14 MED ORDER — ESCITALOPRAM OXALATE 20 MG PO TABS: 20.0000 mg | ORAL_TABLET | Freq: Every morning | ORAL | Status: DC

## 2015-03-14 MED ORDER — SODIUM CHLORIDE 0.9 % IV SOLN
520.0000 mg | Freq: Once | INTRAVENOUS | Status: AC
  Administered 2015-03-14: 520 mg via INTRAVENOUS
  Filled 2015-03-14: qty 52

## 2015-03-14 MED ORDER — ZOLEDRONIC ACID 4 MG/100ML IV SOLN
4.0000 mg | Freq: Once | INTRAVENOUS | Status: AC
  Administered 2015-03-14: 4 mg via INTRAVENOUS
  Filled 2015-03-14: qty 100

## 2015-03-14 MED ORDER — DIPHENHYDRAMINE HCL 25 MG PO CAPS
50.0000 mg | ORAL_CAPSULE | Freq: Once | ORAL | Status: AC
  Administered 2015-03-14: 50 mg via ORAL

## 2015-03-14 MED ORDER — HEPARIN SOD (PORK) LOCK FLUSH 100 UNIT/ML IV SOLN
500.0000 [IU] | Freq: Once | INTRAVENOUS | Status: AC | PRN
  Administered 2015-03-14: 500 [IU]
  Filled 2015-03-14: qty 5

## 2015-03-14 MED ORDER — SODIUM CHLORIDE 0.9 % IV SOLN
Freq: Once | INTRAVENOUS | Status: AC
  Administered 2015-03-14: 10:00:00 via INTRAVENOUS

## 2015-03-14 MED ORDER — SODIUM CHLORIDE 0.9 % IV SOLN
800.0000 mg/m2 | Freq: Once | INTRAVENOUS | Status: AC
  Administered 2015-03-14: 1368 mg via INTRAVENOUS
  Filled 2015-03-14: qty 35.98

## 2015-03-14 MED ORDER — DIPHENHYDRAMINE HCL 25 MG PO CAPS
ORAL_CAPSULE | ORAL | Status: AC
  Filled 2015-03-14: qty 2

## 2015-03-14 MED ORDER — PANTOPRAZOLE SODIUM 40 MG PO TBEC: 40.0000 mg | DELAYED_RELEASE_TABLET | Freq: Every day | ORAL | Status: DC

## 2015-03-14 MED ORDER — ACETAMINOPHEN 325 MG PO TABS
ORAL_TABLET | ORAL | Status: AC
  Filled 2015-03-14: qty 2

## 2015-03-14 MED ORDER — SODIUM CHLORIDE 0.9 % IV SOLN
Freq: Once | INTRAVENOUS | Status: AC
  Administered 2015-03-14: 11:00:00 via INTRAVENOUS
  Filled 2015-03-14: qty 8

## 2015-03-14 NOTE — Progress Notes (Signed)
Hematology and Oncology Follow Up Visit  Mary [name] [id number] 12/21/1956 59 y.o. 03/14/2015   Principle Diagnosis:  Metastatic breast cancer-ER positive/HER-2 positive  Current Therapy:   Herceptin/Perjeta q 21 days Carboplatin/Gemcitabine q 21 days s/p cycle 1 Zometa 4 mg IV Q3 weeks Radiation therapy to whole brain - week 2    Interim History: Mary [name] is here today for a follow-up and cycle 2 of Carboplatin/Gemcitabine. She is feeling better. She is on week two of whole brain radiation therapy.  MRI showed at least 21 new brain metastasis.  She denies fever, chills, n/v, rash, cough, SOB, chest pain, palpitations, abdominal pain, constipation, diarrhea, problems urinating, blood in urine or stool. She is still having headaches and some dizziness. No more falls or syncopal episodes.  She denies tenderness or numbness. She is having worsening tingling in her fingers and is having trouble writing and picking things up.  Her appetite is still good and she is drinking plenty of fluids. Her weight is stable.  In late February, her CA 27.29 was down to 60 and LDH was 232. November MUGA scan showed EF 62%. We will schedule another scan for next week.   Medications:    Medication List       This list is accurate as of: 03/14/15  9:42 AM.  Always use your most recent med list.               acidophilus Caps capsule  Take 1 capsule by mouth daily.     amLODipine 5 MG tablet  Commonly known as:  NORVASC  Take 1 tablet (5 mg total) by mouth every morning.     dexamethasone 4 MG tablet  Commonly known as:  DECADRON  Take 2 tablets (8 mg total) by mouth 3 (three) times daily.     emollient cream  Commonly known as:  BIAFINE  Apply topically as needed.     escitalopram 20 MG tablet  Commonly known as:  LEXAPRO  Take 1 tablet (20 mg total) by mouth every morning.     fluconazole 100 MG tablet  Commonly known as:  DIFLUCAN  Take 1 tablet (100 mg total) by mouth  daily.     HEADACHE RELIEF PM 38-500 MG Tabs  Generic drug:  Diphenhydramine-APAP (sleep)  Take 2 tablets by mouth daily as needed (For headaches.).     lidocaine-prilocaine cream  Commonly known as:  EMLA  Apply 1 application topically as needed. Apply quarter sized amount to portacath site at least one hour prior to treatment .  Cover with saran wrap     lisinopril 40 MG tablet  Commonly known as:  PRINIVIL,ZESTRIL  Take 1 tablet (40 mg total) by mouth every morning.     LORazepam 0.5 MG tablet  Commonly known as:  ATIVAN  Take 1 tablet (0.5 mg total) by mouth every 6 (six) hours as needed (Nausea or vomiting).     multivitamin with minerals Tabs tablet  Take 1 tablet by mouth daily.     ondansetron 8 MG tablet  Commonly known as:  ZOFRAN  Take 1 tablet (8 mg total) by mouth 2 (two) times daily. Start the day after chemo for 3 days. Then take as needed for nausea or vomiting.     oxyCODONE 5 MG immediate release tablet  Commonly known as:  Oxy IR/ROXICODONE  Take 1-3 tablets (5-15 mg total) by mouth every 6 (six) hours as needed for severe pain. Take 1-2 if needed for pain  due to cancer.     pantoprazole 40 MG tablet  Commonly known as:  PROTONIX  Take 1 tablet (40 mg total) by mouth daily.     potassium chloride SA 20 MEQ tablet  Commonly known as:  K-DUR,KLOR-CON  Take 1 tablet (20 mEq total) by mouth 1 day or 1 dose.     PRESCRIPTION MEDICATION  Chemo at [facility] 01/22/2015.     prochlorperazine 10 MG tablet  Commonly known as:  COMPAZINE  Take 1 tablet (10 mg total) by mouth every 6 (six) hours as needed (Nausea or vomiting).     SUMAtriptan 100 MG tablet  Commonly known as:  IMITREX  Take 1 tablet (100 mg total) by mouth every 2 (two) hours as needed for migraine.     VITAMIN B-12 PO  Take by mouth.        Allergies:  Allergies  Allergen Reactions  . Hydrocodone     "makes me crawl on the floor"  . Aspirin Other (See Comments)    Due to ulcers  .  Penicillins Swelling    Past Medical History, Surgical history, Social history, and Family History were reviewed and updated.  Review of Systems: All other 10 point review of systems is negative.   Physical Exam:  vitals were not taken for this visit.  Wt Readings from Last 3 Encounters:  03/04/15 146 lb (66.225 kg)  03/01/15 145 lb (65.772 kg)  02/20/15 146 lb (66.225 kg)    Ocular: Sclerae unicteric, pupils equal, round and reactive to light Ear-nose-throat: Oropharynx clear, dentition fair Lymphatic: No cervical or supraclavicular adenopathy Lungs no rales or rhonchi, good excursion bilaterally Heart regular rate and rhythm, no murmur appreciated Abd soft, nontender, positive bowel sounds MSK no focal spinal tenderness, no joint edema Neuro: non-focal, well-oriented, appropriate affect Breasts: Wound is still draining a scant amount. No changes to her breasts. No lymphadenopathy.  Lab Results  Component Value Date   WBC 11.8* 03/14/2015   HGB 11.4* 02/28/2015   HCT 33.8* 02/28/2015   MCV 95 02/28/2015   PLT 186 02/28/2015   Lab Results  Component Value Date   FERRITIN 151 02/21/2015   IRON 47 02/21/2015   TIBC 398 02/21/2015   UIBC 351 02/21/2015   IRONPCTSAT 12* 02/21/2015   Lab Results  Component Value Date   RBC 3.57* 03/14/2015   No results found for: KPAFRELGTCHN, LAMBDASER, KAPLAMBRATIO No results found for: Kandis Cocking, IGMSERUM No results found for: Odetta Pink, SPEI   Chemistry      Component Value Date/Time   NA 135 02/28/2015 1314   NA 137 04/09/2014 0450   K 3.6 02/28/2015 1314   K 3.5* 04/09/2014 0450   CL 103 02/28/2015 1314   CL 101 04/09/2014 0450   CO2 26 02/28/2015 1314   CO2 23 04/09/2014 0450   BUN 22 02/28/2015 1314   BUN 5* 04/09/2014 0450   CREATININE 0.5* 02/28/2015 1314   CREATININE 0.59 04/09/2014 0450      Component Value Date/Time   CALCIUM 8.4 02/28/2015 1314    CALCIUM 9.3 04/09/2014 0450   ALKPHOS 273* 02/28/2015 1314   ALKPHOS 179* 04/07/2014 0630   AST 92* 02/28/2015 1314   AST 74* 04/07/2014 0630   ALT 141* 02/28/2015 1314   ALT 34 04/07/2014 0630   BILITOT 0.80 02/28/2015 1314   BILITOT 0.7 04/07/2014 0630       Impression and Plan: Mary [name] is 59 year old Afro-American female  with metastatic breast cancer. She is feeling better but still experiencing headaches and mild dizziness. MRI revealed at least 7 new brain metastasis. Her LFT's continue to improve. We will see what the rest of her lab work shows.  We will proceed with cycle 2 of Carboplatin/Gemcitabine today as planned.  She is taking Decadron 8 mg TID.  This is week 2 of whole brain radiation therapy.  Prescription sent to pharmacy for Vitamin B 12 252m daily. We will schedule her for a MUGA scan next week.  She has her current treatment and appointment schedule.  All questions were answered and she is in agreement with the plan.  She knows to call here with any questions or concerns and to go to the ED in the event of an emergency. We can certainly see her again sooner if needed.   CEliezer Bottom NP 3/17/20169:42 AM

## 2015-03-14 NOTE — Patient Instructions (Addendum)
[facility] Discharge Instructions for Patients Receiving Chemotherapy  Today you received the following chemotherapy agents Herceptin, Perjeta, carboplatin, gemzar  To help prevent nausea and vomiting after your treatment, we encourage you to take your nausea medication    If you develop nausea and vomiting that is not controlled by your nausea medication, call the clinic.   BELOW ARE SYMPTOMS THAT SHOULD BE REPORTED IMMEDIATELY:  *FEVER GREATER THAN 100.5 F  *CHILLS WITH OR WITHOUT FEVER  NAUSEA AND VOMITING THAT IS NOT CONTROLLED WITH YOUR NAUSEA MEDICATION  *UNUSUAL SHORTNESS OF BREATH  *UNUSUAL BRUISING OR BLEEDING  TENDERNESS IN MOUTH AND THROAT WITH OR WITHOUT PRESENCE OF ULCERS  *URINARY PROBLEMS  *BOWEL PROBLEMS  UNUSUAL RASH Items with * indicate a potential emergency and should be followed up as soon as possible.  Feel free to call the clinic you have any questions or concerns. The clinic phone number is [phone].  Please show the [facility] at check to the Emergency Department and triage nurse.

## 2015-03-15 ENCOUNTER — Ambulatory Visit
Admission: RE | Admit: 2015-03-15 | Discharge: 2015-03-15 | Disposition: A | Discharge status: Home / Self Care | Payer: Medicaid Other | Source: Ambulatory Visit | Attending: Radiation Oncology | Admitting: Radiation Oncology

## 2015-03-15 ENCOUNTER — Encounter: Payer: Self-pay | Admitting: Radiation Oncology

## 2015-03-15 ENCOUNTER — Ambulatory Visit: Payer: Medicaid Other

## 2015-03-15 VITALS — BP 135/79 | HR 83 | Resp 16 | Wt 144.5 lb

## 2015-03-15 DIAGNOSIS — Z510 Encounter for antineoplastic radiation therapy: Secondary | ICD-10-CM | POA: Diagnosis not present

## 2015-03-15 DIAGNOSIS — C7931 Secondary malignant neoplasm of brain: Secondary | ICD-10-CM

## 2015-03-15 LAB — CANCER ANTIGEN 27.29: CA 27.29: 63 U/mL — ABNORMAL HIGH (ref 0–39)

## 2015-03-15 MED ORDER — DEXAMETHASONE 4 MG PO TABS: 8.0000 mg | ORAL_TABLET | Freq: Two times a day (BID) | ORAL | Status: DC

## 2015-03-15 NOTE — Progress Notes (Signed)
  Radiation Oncology         [phone] ________________________________  Name: Mary [name] MRN: [id number]  Date: 03/15/2015  DOB: [birth year]  Weekly Radiation Therapy Management    ICD-9-CM ICD-10-CM   1. over 41 brain metastases, several showing hemorrhage 198.3 C79.31     Current Dose: 22.5 Gy     Planned Dose:  35 Gy  Narrative . . . . . . . . The patient presents for routine under treatment assessment.                                   The patient is without complaint.                                 Set-up films were reviewed.                                 The chart was checked. Physical Findings. . .  weight is 144 lb 8 oz (65.545 kg). Her blood pressure is 135/79 and her pulse is 83. Her respiration is 16. . Weight essentially stable.  No significant changes. Impression . . . . . . . The patient is tolerating radiation. Plan . . . . . . . . . . . . Continue treatment as planned.  Cut dexamethasone to 4 mg BID, taper further next week.  ________________________________  Sheral Apley. Tammi Klippel, M.D.

## 2015-03-15 NOTE — Progress Notes (Addendum)
Weight and vitals stable. Denies pain. Reports taking decadron 4 mg tid. Reports since decadron taper she has felt no worse over all. Denies nausea, vomiting, diplopia or ringing in the ears. Reports occasional intense headaches continue for which she takes oxycodone 5 mg. Reports occasional dizziness continues. No skin changes to scalp or forehead noted. Patient denies using Biafine as encouraged.

## 2015-03-18 ENCOUNTER — Ambulatory Visit
Admission: RE | Admit: 2015-03-18 | Discharge: 2015-03-18 | Disposition: A | Discharge status: Home / Self Care | Payer: Medicaid Other | Source: Ambulatory Visit | Attending: Radiation Oncology | Admitting: Radiation Oncology

## 2015-03-18 DIAGNOSIS — Z510 Encounter for antineoplastic radiation therapy: Secondary | ICD-10-CM | POA: Diagnosis not present

## 2015-03-19 ENCOUNTER — Ambulatory Visit
Admission: RE | Admit: 2015-03-19 | Discharge: 2015-03-19 | Disposition: A | Discharge status: Home / Self Care | Payer: Medicaid Other | Source: Ambulatory Visit | Attending: Radiation Oncology | Admitting: Radiation Oncology

## 2015-03-19 DIAGNOSIS — Z510 Encounter for antineoplastic radiation therapy: Secondary | ICD-10-CM | POA: Diagnosis not present

## 2015-03-20 ENCOUNTER — Ambulatory Visit
Admission: RE | Admit: 2015-03-20 | Discharge: 2015-03-20 | Disposition: A | Discharge status: Home / Self Care | Payer: Medicaid Other | Source: Ambulatory Visit | Attending: Radiation Oncology | Admitting: Radiation Oncology

## 2015-03-20 ENCOUNTER — Ambulatory Visit (HOSPITAL_COMMUNITY): Payer: Medicaid Other

## 2015-03-20 DIAGNOSIS — Z510 Encounter for antineoplastic radiation therapy: Secondary | ICD-10-CM | POA: Diagnosis not present

## 2015-03-20 NOTE — Progress Notes (Signed)
  Radiation Oncology         [phone] ________________________________  Name: Mary [name] MRN: [id number]  Date: 03/21/2015  DOB: 09-01-1956  Weekly Radiation Therapy Management    ICD-9-CM ICD-10-CM   1. over 75 brain metastases, several showing hemorrhage 198.3 C79.31     Current Dose: 32.5 Gy     Planned Dose:  35 Gy  Narrative . . . . . . . . The patient presents for routine under treatment assessment.                                  Patient reports 2 days of headaches from top of head towards back. She states Decadron was decreased to 1 tab twice a day. She denies nausea, vision changes, unsteadiness. She states she has occasional dizziness. She is taking Diflucan, no signs of thrush on tongue today. She reports good appetite, but she is fatigued. She will complete tomorrow, has FU card                                 Set-up films were reviewed.                                 The chart was checked. Physical Findings. . .  weight is 138 lb 14.4 oz (63.005 kg). Her oral temperature is 98.4 F (36.9 C). Her blood pressure is 140/81 and her pulse is 74. Her respiration is 20. . Weight essentially stable.  No significant changes. Impression . . . . . . . The patient is tolerating radiation. Plan . . . . . . . . . . . . Continue treatment as planned.  Increase dexamethasone to back 4 mg BID, taper further next week.  ________________________________  Sheral Apley. Tammi Klippel, M.D.

## 2015-03-21 ENCOUNTER — Ambulatory Visit
Admission: RE | Admit: 2015-03-21 | Discharge: 2015-03-21 | Disposition: A | Discharge status: Home / Self Care | Payer: Medicaid Other | Source: Ambulatory Visit | Attending: Radiation Oncology | Admitting: Radiation Oncology

## 2015-03-21 ENCOUNTER — Other Ambulatory Visit: Payer: Self-pay | Admitting: Family

## 2015-03-21 ENCOUNTER — Encounter: Payer: Self-pay | Admitting: Radiation Oncology

## 2015-03-21 ENCOUNTER — Ambulatory Visit: Payer: Medicaid Other

## 2015-03-21 VITALS — BP 140/81 | HR 74 | Temp 98.4°F | Resp 20 | Wt 138.9 lb

## 2015-03-21 DIAGNOSIS — C7931 Secondary malignant neoplasm of brain: Secondary | ICD-10-CM

## 2015-03-21 DIAGNOSIS — Z510 Encounter for antineoplastic radiation therapy: Secondary | ICD-10-CM | POA: Diagnosis not present

## 2015-03-21 NOTE — Progress Notes (Signed)
Patient reports 2 days of headaches from top of head towards back. She states Decadron was decreased to 1 tab twice a day. She denies nausea, vision changes, unsteadiness. She states she has occasional dizziness. She is taking Diflucan, no signs of thrush on tongue today. She reports good appetite, but she is fatigued. She will complete tomorrow, has FU card.  BP 140/81 mmHg  Pulse 74  Temp(Src) 98.4 F (36.9 C) (Oral)  Resp 20  Wt 138 lb 14.4 oz (63.005 kg)

## 2015-03-21 NOTE — Patient Instructions (Signed)
Dexamethasone increase back 4 mg twice daily for one week then 4 mg once daily for one week, then Break in half and 2 mg once daily for one week then stop. If headaches are bad, go back up to previous dose for one week

## 2015-03-22 ENCOUNTER — Encounter: Payer: Self-pay | Admitting: Radiation Oncology

## 2015-03-22 ENCOUNTER — Ambulatory Visit (HOSPITAL_BASED_OUTPATIENT_CLINIC_OR_DEPARTMENT_OTHER): Payer: Medicaid Other

## 2015-03-22 ENCOUNTER — Ambulatory Visit
Admission: RE | Admit: 2015-03-22 | Discharge: 2015-03-22 | Disposition: A | Discharge status: Home / Self Care | Payer: Medicaid Other | Source: Ambulatory Visit | Attending: Radiation Oncology | Admitting: Radiation Oncology

## 2015-03-22 ENCOUNTER — Ambulatory Visit: Payer: Medicaid Other

## 2015-03-22 ENCOUNTER — Other Ambulatory Visit (HOSPITAL_BASED_OUTPATIENT_CLINIC_OR_DEPARTMENT_OTHER): Payer: Medicaid Other

## 2015-03-22 DIAGNOSIS — C50911 Malignant neoplasm of unspecified site of right female breast: Secondary | ICD-10-CM | POA: Diagnosis not present

## 2015-03-22 DIAGNOSIS — C7951 Secondary malignant neoplasm of bone: Secondary | ICD-10-CM | POA: Diagnosis not present

## 2015-03-22 DIAGNOSIS — Z5189 Encounter for other specified aftercare: Secondary | ICD-10-CM

## 2015-03-22 DIAGNOSIS — Z5111 Encounter for antineoplastic chemotherapy: Secondary | ICD-10-CM

## 2015-03-22 DIAGNOSIS — Z510 Encounter for antineoplastic radiation therapy: Secondary | ICD-10-CM | POA: Diagnosis not present

## 2015-03-22 LAB — CMP (CANCER CENTER ONLY)
ALBUMIN: 3.1 g/dL — AB (ref 3.3–5.5)
ALK PHOS: 177 U/L — AB (ref 26–84)
ALT: 137 U/L — AB (ref 10–47)
AST: 70 U/L — ABNORMAL HIGH (ref 11–38)
BUN, Bld: 22 mg/dL (ref 7–22)
CALCIUM: 8.5 mg/dL (ref 8.0–10.3)
CHLORIDE: 102 meq/L (ref 98–108)
CO2: 26 mEq/L (ref 18–33)
CREATININE: 0.7 mg/dL (ref 0.6–1.2)
Glucose, Bld: 100 mg/dL (ref 73–118)
POTASSIUM: 3.8 meq/L (ref 3.3–4.7)
Sodium: 135 mEq/L (ref 128–145)
Total Bilirubin: 0.6 mg/dl (ref 0.20–1.60)
Total Protein: 6.3 g/dL — ABNORMAL LOW (ref 6.4–8.1)

## 2015-03-22 LAB — CBC WITH DIFFERENTIAL (CANCER CENTER ONLY)
BASO#: 0 10*3/uL (ref 0.0–0.2)
BASO%: 0 % (ref 0.0–2.0)
EOS%: 0 % (ref 0.0–7.0)
Eosinophils Absolute: 0 10*3/uL (ref 0.0–0.5)
HCT: 31.8 % — ABNORMAL LOW (ref 34.8–46.6)
HGB: 10.6 g/dL — ABNORMAL LOW (ref 11.6–15.9)
LYMPH#: 0.5 10*3/uL — ABNORMAL LOW (ref 0.9–3.3)
LYMPH%: 10.6 % — ABNORMAL LOW (ref 14.0–48.0)
MCH: 32.4 pg (ref 26.0–34.0)
MCHC: 33.3 g/dL (ref 32.0–36.0)
MCV: 97 fL (ref 81–101)
MONO#: 0.7 10*3/uL (ref 0.1–0.9)
MONO%: 15.2 % — ABNORMAL HIGH (ref 0.0–13.0)
NEUT#: 3.4 10*3/uL (ref 1.5–6.5)
NEUT%: 74.2 % (ref 39.6–80.0)
PLATELETS: 197 10*3/uL (ref 145–400)
RBC: 3.27 10*6/uL — ABNORMAL LOW (ref 3.70–5.32)
RDW: 16.4 % — AB (ref 11.1–15.7)
WBC: 4.6 10*3/uL (ref 3.9–10.0)

## 2015-03-22 MED ORDER — PEGFILGRASTIM 6 MG/0.6ML ~~LOC~~ PSKT
6.0000 mg | PREFILLED_SYRINGE | Freq: Once | SUBCUTANEOUS | Status: AC
  Administered 2015-03-22: 6 mg via SUBCUTANEOUS
  Filled 2015-03-22: qty 0.6

## 2015-03-22 MED ORDER — HEPARIN SOD (PORK) LOCK FLUSH 100 UNIT/ML IV SOLN
250.0000 [IU] | Freq: Once | INTRAVENOUS | Status: DC | PRN
  Filled 2015-03-22: qty 5

## 2015-03-22 MED ORDER — SODIUM CHLORIDE 0.9 % IJ SOLN
3.0000 mL | INTRAMUSCULAR | Status: DC | PRN
  Filled 2015-03-22: qty 10

## 2015-03-22 MED ORDER — HEPARIN SOD (PORK) LOCK FLUSH 100 UNIT/ML IV SOLN
500.0000 [IU] | Freq: Once | INTRAVENOUS | Status: AC | PRN
  Administered 2015-03-22: 500 [IU]
  Filled 2015-03-22: qty 5

## 2015-03-22 MED ORDER — SODIUM CHLORIDE 0.9 % IV SOLN
Freq: Once | INTRAVENOUS | Status: AC
  Administered 2015-03-22: 14:00:00 via INTRAVENOUS

## 2015-03-22 MED ORDER — SODIUM CHLORIDE 0.9 % IJ SOLN
10.0000 mL | INTRAMUSCULAR | Status: DC | PRN
  Administered 2015-03-22: 10 mL
  Filled 2015-03-22: qty 10

## 2015-03-22 MED ORDER — GEMCITABINE HCL CHEMO INJECTION 1 GM/26.3ML
800.0000 mg/m2 | Freq: Once | INTRAVENOUS | Status: AC
  Administered 2015-03-22: 1368 mg via INTRAVENOUS
  Filled 2015-03-22: qty 30.72

## 2015-03-22 MED ORDER — PROCHLORPERAZINE MALEATE 10 MG PO TABS
10.0000 mg | ORAL_TABLET | Freq: Once | ORAL | Status: AC
  Administered 2015-03-22: 10 mg via ORAL

## 2015-03-22 MED ORDER — ALTEPLASE 2 MG IJ SOLR
2.0000 mg | Freq: Once | INTRAMUSCULAR | Status: DC | PRN
  Filled 2015-03-22: qty 2

## 2015-03-22 MED ORDER — PROCHLORPERAZINE MALEATE 10 MG PO TABS
ORAL_TABLET | ORAL | Status: AC
  Filled 2015-03-22: qty 1

## 2015-03-22 NOTE — Patient Instructions (Signed)

## 2015-03-22 NOTE — Progress Notes (Signed)
Dr Marin Olp & pharmacy aware of elevated liver enzymes. Proceed with Gemzar as scheduled. dph

## 2015-03-24 NOTE — Progress Notes (Signed)
Radiation Oncology         [phone] ________________________________  Name: Mary [name] MRN: [id number]  Date: 03/22/2015  DOB: Aug 14, 1956  End of Treatment Note   C79.31   DIAGNOSIS: 59 year old woman with ER positive HER-2 positive metastatic breast cancer and at least 33 brain metastases-stage IV     Indication for treatment:  Palliation       Radiation treatment dates:  03/05/2015-03/22/2015  Site/dose:   The whole brain was treated to 35 Gy in 14 fractions of 2.5 Gy  Beams/energy:   Right and Left radiation fields were treated using 6 MV X-rays with custom MLC collimation to shield the eyes and face.  The patient was immobilized with a thermoplastic mask and isocenter was verified with weekly port films.  Narrative: The patient tolerated radiation treatment relatively well.   She required decadron during radiotherapy.  Plan: The patient has completed radiation treatment. The patient will return to radiation oncology clinic for routine followup in one month. I advised them to call or return sooner if they have any questions or concerns related to their recovery or treatment. ________________________________  Sheral Apley. Tammi Klippel, M.D.

## 2015-04-01 ENCOUNTER — Other Ambulatory Visit: Payer: Self-pay | Admitting: Hematology & Oncology

## 2015-04-04 ENCOUNTER — Other Ambulatory Visit (HOSPITAL_BASED_OUTPATIENT_CLINIC_OR_DEPARTMENT_OTHER): Payer: Medicaid Other

## 2015-04-04 ENCOUNTER — Other Ambulatory Visit: Payer: Self-pay | Admitting: *Deleted

## 2015-04-04 ENCOUNTER — Ambulatory Visit (HOSPITAL_BASED_OUTPATIENT_CLINIC_OR_DEPARTMENT_OTHER): Payer: Medicaid Other

## 2015-04-04 ENCOUNTER — Ambulatory Visit (HOSPITAL_BASED_OUTPATIENT_CLINIC_OR_DEPARTMENT_OTHER): Payer: Medicaid Other | Admitting: Family

## 2015-04-04 ENCOUNTER — Encounter: Payer: Self-pay | Admitting: Family

## 2015-04-04 ENCOUNTER — Encounter: Payer: Self-pay | Admitting: Pharmacist

## 2015-04-04 VITALS — BP 129/79 | HR 73 | Temp 98.4°F | Resp 14 | Ht 64.0 in | Wt 141.0 lb

## 2015-04-04 DIAGNOSIS — C50911 Malignant neoplasm of unspecified site of right female breast: Secondary | ICD-10-CM

## 2015-04-04 DIAGNOSIS — C7931 Secondary malignant neoplasm of brain: Secondary | ICD-10-CM

## 2015-04-04 DIAGNOSIS — C50919 Malignant neoplasm of unspecified site of unspecified female breast: Secondary | ICD-10-CM

## 2015-04-04 LAB — CMP (CANCER CENTER ONLY)
ALBUMIN: 3.3 g/dL (ref 3.3–5.5)
ALT(SGPT): 266 U/L (ref 10–47)
AST: 95 U/L — ABNORMAL HIGH (ref 11–38)
Alkaline Phosphatase: 172 U/L — ABNORMAL HIGH (ref 26–84)
BUN, Bld: 21 mg/dL (ref 7–22)
CALCIUM: 8.7 mg/dL (ref 8.0–10.3)
CHLORIDE: 103 meq/L (ref 98–108)
CO2: 26 meq/L (ref 18–33)
Creat: 0.7 mg/dl (ref 0.6–1.2)
Glucose, Bld: 143 mg/dL — ABNORMAL HIGH (ref 73–118)
Potassium: 4.1 mEq/L (ref 3.3–4.7)
SODIUM: 140 meq/L (ref 128–145)
TOTAL PROTEIN: 6.6 g/dL (ref 6.4–8.1)
Total Bilirubin: 0.6 mg/dl (ref 0.20–1.60)

## 2015-04-04 LAB — CBC WITH DIFFERENTIAL (CANCER CENTER ONLY)
BASO#: 0 10*3/uL (ref 0.0–0.2)
BASO%: 0.2 % (ref 0.0–2.0)
EOS%: 0 % (ref 0.0–7.0)
Eosinophils Absolute: 0 10*3/uL (ref 0.0–0.5)
HCT: 32.5 % — ABNORMAL LOW (ref 34.8–46.6)
HEMOGLOBIN: 10.9 g/dL — AB (ref 11.6–15.9)
LYMPH#: 0.6 10*3/uL — ABNORMAL LOW (ref 0.9–3.3)
LYMPH%: 12.1 % — ABNORMAL LOW (ref 14.0–48.0)
MCH: 32.5 pg (ref 26.0–34.0)
MCHC: 33.5 g/dL (ref 32.0–36.0)
MCV: 97 fL (ref 81–101)
MONO#: 0.6 10*3/uL (ref 0.1–0.9)
MONO%: 11.9 % (ref 0.0–13.0)
NEUT%: 75.8 % (ref 39.6–80.0)
NEUTROS ABS: 3.9 10*3/uL (ref 1.5–6.5)
PLATELETS: 118 10*3/uL — AB (ref 145–400)
RBC: 3.35 10*6/uL — ABNORMAL LOW (ref 3.70–5.32)
RDW: 18.1 % — ABNORMAL HIGH (ref 11.1–15.7)
WBC: 5.1 10*3/uL (ref 3.9–10.0)

## 2015-04-04 LAB — TECHNOLOGIST REVIEW CHCC SATELLITE

## 2015-04-04 LAB — LACTATE DEHYDROGENASE: LDH: 201 U/L (ref 94–250)

## 2015-04-04 MED ORDER — TRASTUZUMAB CHEMO INJECTION 440 MG: 6.0000 mg/kg | Freq: Once | INTRAVENOUS | Status: DC

## 2015-04-04 MED ORDER — HEPARIN SOD (PORK) LOCK FLUSH 100 UNIT/ML IV SOLN
500.0000 [IU] | Freq: Once | INTRAVENOUS | Status: AC | PRN
  Administered 2015-04-04: 500 [IU]
  Filled 2015-04-04: qty 5

## 2015-04-04 MED ORDER — SODIUM CHLORIDE 0.9 % IV SOLN
Freq: Once | INTRAVENOUS | Status: AC
  Administered 2015-04-04: 11:00:00 via INTRAVENOUS
  Filled 2015-04-04: qty 8

## 2015-04-04 MED ORDER — SODIUM CHLORIDE 0.9 % IV SOLN: 523.0000 mg | Freq: Once | INTRAVENOUS | Status: DC

## 2015-04-04 MED ORDER — ACETAMINOPHEN 325 MG PO TABS
650.0000 mg | ORAL_TABLET | Freq: Once | ORAL | Status: AC
  Administered 2015-04-04: 650 mg via ORAL

## 2015-04-04 MED ORDER — DIPHENHYDRAMINE HCL 25 MG PO CAPS
ORAL_CAPSULE | ORAL | Status: AC
  Filled 2015-04-04: qty 2

## 2015-04-04 MED ORDER — OXYCODONE HCL 5 MG PO TABS: 5.0000 mg | ORAL_TABLET | Freq: Four times a day (QID) | ORAL | Status: DC | PRN

## 2015-04-04 MED ORDER — GEMCITABINE HCL CHEMO INJECTION 1 GM/26.3ML: 800.0000 mg/m2 | Freq: Once | INTRAVENOUS | Status: DC

## 2015-04-04 MED ORDER — SODIUM CHLORIDE 0.9 % IJ SOLN
10.0000 mL | INTRAMUSCULAR | Status: DC | PRN
  Administered 2015-04-04: 10 mL
  Filled 2015-04-04: qty 10

## 2015-04-04 MED ORDER — SODIUM CHLORIDE 0.9 % IV SOLN: 420.0000 mg | Freq: Once | INTRAVENOUS | Status: DC

## 2015-04-04 MED ORDER — SODIUM CHLORIDE 0.9 % IV SOLN
Freq: Once | INTRAVENOUS | Status: AC
  Administered 2015-04-04: 10:00:00 via INTRAVENOUS

## 2015-04-04 MED ORDER — ACETAMINOPHEN 325 MG PO TABS
ORAL_TABLET | ORAL | Status: AC
  Filled 2015-04-04: qty 2

## 2015-04-04 MED ORDER — DIPHENHYDRAMINE HCL 25 MG PO CAPS
50.0000 mg | ORAL_CAPSULE | Freq: Once | ORAL | Status: AC
  Administered 2015-04-04: 50 mg via ORAL

## 2015-04-04 NOTE — Progress Notes (Signed)
Hematology and Oncology Follow Up Visit  Mary [name] [id number] 1956/12/01 59 y.o. 04/04/2015   Principle Diagnosis:  Metastatic breast cancer-ER positive/HER-2 positive  Current Therapy:   Herceptin/Perjeta q 21 days Carboplatin/Gemcitabine q 21 days s/p cycle 2 Zometa 4 mg IV Q3 weeks Radiation therapy to whole brain - completed 03/22/15    Interim History: Mary [name] is here today for a follow-up and cycle 3 of Carboplatin/Gemcitabine. She is feeling ok.  MRI of the brain in March showed at least 65 new brain metastasis. She has completed radiation treatment to the whole head.  She has felt fatigued. She has also had some headaches and dizziness. No more falls or syncopal episodes.   She denies fever, chills, n/v, rash, cough, SOB, chest pain, palpitations, abdominal pain, constipation, diarrhea, problems urinating, blood in urine or stool. She is still having headaches and some dizziness. No more falls or syncopal episodes.  She denies tenderness or numbness in her extremities. Thie tingling in her fingers is unchanged.  Her appetite is still good and she is drinking plenty of fluids. Her weight is stable.  In March, her CA 27.29 was down to 63 and LDH was 232. November MUGA scan showed EF 62%. We will schedule another scan for next week.   Medications:    Medication List       This list is accurate as of: 04/04/15  9:27 AM.  Always use your most recent med list.               acidophilus Caps capsule  Take 1 capsule by mouth daily.     amLODipine 5 MG tablet  Commonly known as:  NORVASC  Take 1 tablet (5 mg total) by mouth every morning.     dexamethasone 4 MG tablet  Commonly known as:  DECADRON  Take 2 tablets (8 mg total) by mouth 2 (two) times daily.     emollient cream  Commonly known as:  BIAFINE  Apply topically as needed.     escitalopram 20 MG tablet  Commonly known as:  LEXAPRO  Take 1 tablet (20 mg total) by mouth every morning.     fluconazole 100 MG tablet  Commonly known as:  DIFLUCAN  Take 1 tablet (100 mg total) by mouth daily.     HEADACHE RELIEF PM 38-500 MG Tabs  Generic drug:  Diphenhydramine-APAP (sleep)  Take 2 tablets by mouth daily as needed (For headaches.).     lidocaine-prilocaine cream  Commonly known as:  EMLA  Apply 1 application topically as needed. Apply quarter sized amount to portacath site at least one hour prior to treatment .  Cover with saran wrap     lisinopril 40 MG tablet  Commonly known as:  PRINIVIL,ZESTRIL  Take 1 tablet (40 mg total) by mouth every morning.     LORazepam 0.5 MG tablet  Commonly known as:  ATIVAN  Take 1 tablet (0.5 mg total) by mouth every 6 (six) hours as needed (Nausea or vomiting).     multivitamin with minerals Tabs tablet  Take 1 tablet by mouth daily.     ondansetron 8 MG tablet  Commonly known as:  ZOFRAN  Take 1 tablet (8 mg total) by mouth 2 (two) times daily. Start the day after chemo for 3 days. Then take as needed for nausea or vomiting.     oxyCODONE 5 MG immediate release tablet  Commonly known as:  Oxy IR/ROXICODONE  Take 1-3 tablets (5-15 mg total) by mouth  every 6 (six) hours as needed for severe pain. Take 1-2 if needed for pain due to cancer.     pantoprazole 40 MG tablet  Commonly known as:  PROTONIX  Take 1 tablet (40 mg total) by mouth daily.     potassium chloride SA 20 MEQ tablet  Commonly known as:  K-DUR,KLOR-CON  Take 1 tablet (20 mEq total) by mouth 1 day or 1 dose.     PRESCRIPTION MEDICATION  Chemo at [facility] 01/22/2015.     prochlorperazine 10 MG tablet  Commonly known as:  COMPAZINE  Take 1 tablet (10 mg total) by mouth every 6 (six) hours as needed (Nausea or vomiting).     pyridOXINE 100 MG tablet  Commonly known as:  VITAMIN B-6  Take 2 tablets (200 mg total) by mouth daily.     SUMAtriptan 100 MG tablet  Commonly known as:  IMITREX  Take 1 tablet (100 mg total) by mouth every 2 (two) hours as needed for  migraine.     VITAMIN B-12 PO  Take by mouth.        Allergies:  Allergies  Allergen Reactions  . Hydrocodone     "makes me crawl on the floor"  . Aspirin Other (See Comments)    Due to ulcers  . Penicillins Swelling    Past Medical History, Surgical history, Social history, and Family History were reviewed and updated.  Review of Systems: All other 10 point review of systems is negative.   Physical Exam:  vitals were not taken for this visit.  Wt Readings from Last 3 Encounters:  03/14/15 143 lb (64.864 kg)  03/04/15 146 lb (66.225 kg)  03/01/15 145 lb (65.772 kg)    Ocular: Sclerae unicteric, pupils equal, round and reactive to light Ear-nose-throat: Oropharynx clear, dentition fair Lymphatic: No cervical or supraclavicular adenopathy Lungs no rales or rhonchi, good excursion bilaterally Heart regular rate and rhythm, no murmur appreciated Abd soft, nontender, positive bowel sounds MSK no focal spinal tenderness, no joint edema Neuro: non-focal, well-oriented, appropriate affect Breasts: Wound is still draining a scant amount at times. No changes to her breasts. No lymphadenopathy.  Lab Results  Component Value Date   WBC 4.6 03/22/2015   HGB 10.6* 03/22/2015   HCT 31.8* 03/22/2015   MCV 97 03/22/2015   PLT 197 03/22/2015   Lab Results  Component Value Date   FERRITIN 151 02/21/2015   IRON 47 02/21/2015   TIBC 398 02/21/2015   UIBC 351 02/21/2015   IRONPCTSAT 12* 02/21/2015   Lab Results  Component Value Date   RBC 3.27* 03/22/2015   No results found for: KPAFRELGTCHN, LAMBDASER, KAPLAMBRATIO No results found for: IGGSERUM, IGA, IGMSERUM No results found for: Odetta Pink, SPEI   Chemistry      Component Value Date/Time   NA 135 03/22/2015 1236   NA 137 04/09/2014 0450   K 3.8 03/22/2015 1236   K 3.5* 04/09/2014 0450   CL 102 03/22/2015 1236   CL 101 04/09/2014 0450   CO2 26 03/22/2015  1236   CO2 23 04/09/2014 0450   BUN 22 03/22/2015 1236   BUN 5* 04/09/2014 0450   CREATININE 0.7 03/22/2015 1236   CREATININE 0.59 04/09/2014 0450      Component Value Date/Time   CALCIUM 8.5 03/22/2015 1236   CALCIUM 9.3 04/09/2014 0450   ALKPHOS 177* 03/22/2015 1236   ALKPHOS 179* 04/07/2014 0630   AST 70* 03/22/2015 1236   AST  74* 04/07/2014 0630   ALT 137* 03/22/2015 1236   ALT 34 04/07/2014 0630   BILITOT 0.60 03/22/2015 1236   BILITOT 0.7 04/07/2014 0630     Impression and Plan: Ms. Kreuser is 59 year old Afro-American female with metastatic breast cancer. She is feeling better but still experiencing headaches and mild dizziness. MRI revealed at least 8 new brain metastasis. She has completed radiation therapy to the whole brain. She has had some headaches and dizziness without falls.  We will refill her oxycodone.  We will proceed with cycle 3 of Carboplatin/Gemcitabine today as planned.  She is taking Decadron 8 mg TID.  Her LFT's are elevated.  She will have a MUGA scan this week.  We will give her a new treatment and appointment schedule.  All questions were answered and she is in agreement with the plan.  She knows to call here with any questions or concerns and to go to the ED in the event of an emergency. We can certainly see her again sooner if needed.   Eliezer Bottom, NP 4/7/20169:27 AM

## 2015-04-04 NOTE — Progress Notes (Signed)
With her LFT's continuing to rise, we have decided to hold off on chemo today. She has an appointment next Thursday and will keep that. I will discuss repeating scans and possibly changing treatment plan with [name] on Monday.

## 2015-04-04 NOTE — Progress Notes (Signed)
Per sarah pt will not be treated to day.

## 2015-04-04 NOTE — Progress Notes (Signed)
Confirm with pharmacy and sarah that is ok to treat and give tylenol.

## 2015-04-05 ENCOUNTER — Ambulatory Visit: Payer: Medicaid Other

## 2015-04-10 ENCOUNTER — Ambulatory Visit (HOSPITAL_COMMUNITY): Payer: Medicaid Other

## 2015-04-11 ENCOUNTER — Encounter: Payer: Self-pay | Admitting: Family

## 2015-04-11 ENCOUNTER — Ambulatory Visit: Payer: Medicaid Other

## 2015-04-11 ENCOUNTER — Ambulatory Visit (HOSPITAL_BASED_OUTPATIENT_CLINIC_OR_DEPARTMENT_OTHER): Payer: Medicaid Other

## 2015-04-11 ENCOUNTER — Other Ambulatory Visit (HOSPITAL_BASED_OUTPATIENT_CLINIC_OR_DEPARTMENT_OTHER): Payer: Medicaid Other

## 2015-04-11 ENCOUNTER — Ambulatory Visit (HOSPITAL_BASED_OUTPATIENT_CLINIC_OR_DEPARTMENT_OTHER): Payer: Medicaid Other | Admitting: Family

## 2015-04-11 ENCOUNTER — Other Ambulatory Visit: Payer: Medicaid Other

## 2015-04-11 VITALS — BP 114/73 | HR 87 | Temp 98.4°F | Resp 18 | Wt 145.0 lb

## 2015-04-11 DIAGNOSIS — C50911 Malignant neoplasm of unspecified site of right female breast: Secondary | ICD-10-CM

## 2015-04-11 DIAGNOSIS — R5383 Other fatigue: Secondary | ICD-10-CM | POA: Diagnosis not present

## 2015-04-11 DIAGNOSIS — Z5111 Encounter for antineoplastic chemotherapy: Secondary | ICD-10-CM

## 2015-04-11 DIAGNOSIS — C7931 Secondary malignant neoplasm of brain: Secondary | ICD-10-CM | POA: Diagnosis not present

## 2015-04-11 DIAGNOSIS — C50919 Malignant neoplasm of unspecified site of unspecified female breast: Secondary | ICD-10-CM

## 2015-04-11 DIAGNOSIS — Z5112 Encounter for antineoplastic immunotherapy: Secondary | ICD-10-CM | POA: Diagnosis present

## 2015-04-11 DIAGNOSIS — R42 Dizziness and giddiness: Secondary | ICD-10-CM | POA: Diagnosis not present

## 2015-04-11 DIAGNOSIS — R51 Headache: Secondary | ICD-10-CM

## 2015-04-11 LAB — CBC WITH DIFFERENTIAL (CANCER CENTER ONLY)
BASO#: 0 10*3/uL (ref 0.0–0.2)
BASO%: 0.2 % (ref 0.0–2.0)
EOS ABS: 0 10*3/uL (ref 0.0–0.5)
EOS%: 0.1 % (ref 0.0–7.0)
HCT: 33.1 % — ABNORMAL LOW (ref 34.8–46.6)
HEMOGLOBIN: 10.9 g/dL — AB (ref 11.6–15.9)
LYMPH#: 1.2 10*3/uL (ref 0.9–3.3)
LYMPH%: 11.5 % — AB (ref 14.0–48.0)
MCH: 32.6 pg (ref 26.0–34.0)
MCHC: 32.9 g/dL (ref 32.0–36.0)
MCV: 99 fL (ref 81–101)
MONO#: 1.3 10*3/uL — ABNORMAL HIGH (ref 0.1–0.9)
MONO%: 13.3 % — AB (ref 0.0–13.0)
NEUT#: 7.5 10*3/uL — ABNORMAL HIGH (ref 1.5–6.5)
NEUT%: 74.9 % (ref 39.6–80.0)
Platelets: 250 10*3/uL (ref 145–400)
RBC: 3.34 10*6/uL — ABNORMAL LOW (ref 3.70–5.32)
RDW: 19.9 % — AB (ref 11.1–15.7)
WBC: 10.1 10*3/uL — ABNORMAL HIGH (ref 3.9–10.0)

## 2015-04-11 LAB — CMP (CANCER CENTER ONLY)
ALBUMIN: 3 g/dL — AB (ref 3.3–5.5)
ALK PHOS: 146 U/L — AB (ref 26–84)
ALT(SGPT): 139 U/L — ABNORMAL HIGH (ref 10–47)
AST: 62 U/L — AB (ref 11–38)
BILIRUBIN TOTAL: 0.6 mg/dL (ref 0.20–1.60)
BUN, Bld: 20 mg/dL (ref 7–22)
CO2: 30 meq/L (ref 18–33)
Calcium: 8.7 mg/dL (ref 8.0–10.3)
Chloride: 104 mEq/L (ref 98–108)
Creat: 0.7 mg/dl (ref 0.6–1.2)
Glucose, Bld: 89 mg/dL (ref 73–118)
Potassium: 3.7 mEq/L (ref 3.3–4.7)
SODIUM: 142 meq/L (ref 128–145)
TOTAL PROTEIN: 5.9 g/dL — AB (ref 6.4–8.1)

## 2015-04-11 LAB — TECHNOLOGIST REVIEW CHCC SATELLITE

## 2015-04-11 MED ORDER — SODIUM CHLORIDE 0.9 % IJ SOLN
10.0000 mL | INTRAMUSCULAR | Status: DC | PRN
  Administered 2015-04-11: 10 mL
  Filled 2015-04-11: qty 10

## 2015-04-11 MED ORDER — SODIUM CHLORIDE 0.9 % IV SOLN
Freq: Once | INTRAVENOUS | Status: AC
  Administered 2015-04-11: 10:00:00 via INTRAVENOUS
  Filled 2015-04-11: qty 8

## 2015-04-11 MED ORDER — ZOLEDRONIC ACID 4 MG/100ML IV SOLN
4.0000 mg | Freq: Once | INTRAVENOUS | Status: AC
  Administered 2015-04-11: 4 mg via INTRAVENOUS
  Filled 2015-04-11: qty 100

## 2015-04-11 MED ORDER — DIPHENHYDRAMINE HCL 25 MG PO CAPS
ORAL_CAPSULE | ORAL | Status: AC
Start: 2015-04-11 — End: 2015-04-11
  Filled 2015-04-11: qty 2

## 2015-04-11 MED ORDER — SODIUM CHLORIDE 0.9 % IV SOLN
523.0000 mg | Freq: Once | INTRAVENOUS | Status: AC
  Administered 2015-04-11: 520 mg via INTRAVENOUS
  Filled 2015-04-11: qty 52

## 2015-04-11 MED ORDER — HEPARIN SOD (PORK) LOCK FLUSH 100 UNIT/ML IV SOLN
500.0000 [IU] | Freq: Once | INTRAVENOUS | Status: AC | PRN
  Administered 2015-04-11: 500 [IU]
  Filled 2015-04-11: qty 5

## 2015-04-11 MED ORDER — SODIUM CHLORIDE 0.9 % IV SOLN
Freq: Once | INTRAVENOUS | Status: AC
  Administered 2015-04-11: 10:00:00 via INTRAVENOUS

## 2015-04-11 MED ORDER — TRASTUZUMAB CHEMO INJECTION 440 MG
6.0000 mg/kg | Freq: Once | INTRAVENOUS | Status: AC
  Administered 2015-04-11: 399 mg via INTRAVENOUS
  Filled 2015-04-11: qty 19

## 2015-04-11 MED ORDER — GEMCITABINE HCL CHEMO INJECTION 1 GM/26.3ML
800.0000 mg/m2 | Freq: Once | INTRAVENOUS | Status: AC
  Administered 2015-04-11: 1368 mg via INTRAVENOUS
  Filled 2015-04-11: qty 35.98

## 2015-04-11 MED ORDER — ACETAMINOPHEN 325 MG PO TABS
ORAL_TABLET | ORAL | Status: AC
  Filled 2015-04-11: qty 2

## 2015-04-11 MED ORDER — SODIUM CHLORIDE 0.9 % IV SOLN
420.0000 mg | Freq: Once | INTRAVENOUS | Status: AC
  Administered 2015-04-11: 420 mg via INTRAVENOUS
  Filled 2015-04-11: qty 14

## 2015-04-11 MED ORDER — DIPHENHYDRAMINE HCL 25 MG PO CAPS
50.0000 mg | ORAL_CAPSULE | Freq: Once | ORAL | Status: AC
  Administered 2015-04-11: 50 mg via ORAL

## 2015-04-11 MED ORDER — ACETAMINOPHEN 325 MG PO TABS
650.0000 mg | ORAL_TABLET | Freq: Once | ORAL | Status: AC
  Administered 2015-04-11: 650 mg via ORAL

## 2015-04-11 NOTE — Patient Instructions (Addendum)
[facility] Discharge Instructions for Patients Receiving Chemotherapy  Today you received the following chemotherapy agents Carboplatin, Gemzar, perjeta, herceptin  To help prevent nausea and vomiting after your treatment, we encourage you to take your nausea medication    If you develop nausea and vomiting that is not controlled by your nausea medication, call the clinic.   BELOW ARE SYMPTOMS THAT SHOULD BE REPORTED IMMEDIATELY:  *FEVER GREATER THAN 100.5 F  *CHILLS WITH OR WITHOUT FEVER  NAUSEA AND VOMITING THAT IS NOT CONTROLLED WITH YOUR NAUSEA MEDICATION  *UNUSUAL SHORTNESS OF BREATH  *UNUSUAL BRUISING OR BLEEDING  TENDERNESS IN MOUTH AND THROAT WITH OR WITHOUT PRESENCE OF ULCERS  *URINARY PROBLEMS  *BOWEL PROBLEMS  UNUSUAL RASH Items with * indicate a potential emergency and should be followed up as soon as possible.  Feel free to call the clinic you have any questions or concerns. The clinic phone number is [phone].  Please show the [facility] at check-in to the Emergency Department and triage nurse.

## 2015-04-11 NOTE — Progress Notes (Signed)
Hematology and Oncology Follow Up Visit  Mary [name] [id number] Jun 20, 1956 59 y.o. 04/11/2015   Principle Diagnosis:  Metastatic breast cancer-ER positive/HER-2 positive  Current Therapy:   Herceptin/Perjeta q 21 days Carboplatin/Gemcitabine q 21 days s/p cycle 2 Zometa 4 mg IV Q3 weeks Radiation therapy to whole brain - completed 03/22/15    Interim History: [name] is here today for a follow-up and cycle 3 of Carboplatin/Gemcitabine. We held her treatment last week because of her increased LFT's. These are slightly improved today.  She has had some dizziness but no falls or syncopal episodes.  MRI of the brain in March showed at least 64 new brain metastasis. She has completed radiation treatment to the whole head.  She has felt fatigued and had some chest tightness and SOB that comes and goes. She denies fever, chills, n/v, rash, cough, chest pain, palpitations, abdominal pain, constipation, diarrhea, problems urinating, blood in urine or stool. She is still having headaches and some dizziness. No more falls or syncopal episodes.  She denies tenderness or numbness in her extremities. The tingling in her fingers is unchanged. Her appetite is still good and she is drinking plenty of fluids. Her weight is stable.  In March, her CA 27.29 was down to 63 and LDH was 232. November MUGA scan showed EF 62%. She missed her Muga scan appointment so we will get her rescheduled for this week.   Medications:    Medication List       This list is accurate as of: 04/11/15  9:12 AM.  Always use your most recent med list.               acidophilus Caps capsule  Take 1 capsule by mouth daily.     amLODipine 5 MG tablet  Commonly known as:  NORVASC  Take 1 tablet (5 mg total) by mouth every morning.     dexamethasone 4 MG tablet  Commonly known as:  DECADRON  Take 2 tablets (8 mg total) by mouth 2 (two) times daily.     emollient cream  Commonly known as:  BIAFINE  Apply  topically as needed.     escitalopram 20 MG tablet  Commonly known as:  LEXAPRO  Take 1 tablet (20 mg total) by mouth every morning.     HEADACHE RELIEF PM 38-500 MG Tabs  Generic drug:  Diphenhydramine-APAP (sleep)  Take 2 tablets by mouth daily as needed (For headaches.).     lisinopril 40 MG tablet  Commonly known as:  PRINIVIL,ZESTRIL  Take 1 tablet (40 mg total) by mouth every morning.     LORazepam 0.5 MG tablet  Commonly known as:  ATIVAN  Take 1 tablet (0.5 mg total) by mouth every 6 (six) hours as needed (Nausea or vomiting).     multivitamin with minerals Tabs tablet  Take 1 tablet by mouth daily.     ondansetron 8 MG tablet  Commonly known as:  ZOFRAN  Take 1 tablet (8 mg total) by mouth 2 (two) times daily. Start the day after chemo for 3 days. Then take as needed for nausea or vomiting.     oxyCODONE 5 MG immediate release tablet  Commonly known as:  Oxy IR/ROXICODONE  Take 1-3 tablets (5-15 mg total) by mouth every 6 (six) hours as needed for severe pain. Take 1-2 if needed for pain due to cancer.     pantoprazole 40 MG tablet  Commonly known as:  PROTONIX  Take 1 tablet (40 mg  total) by mouth daily.     potassium chloride SA 20 MEQ tablet  Commonly known as:  K-DUR,KLOR-CON  Take 1 tablet (20 mEq total) by mouth 1 day or 1 dose.     pyridOXINE 100 MG tablet  Commonly known as:  VITAMIN B-6  Take 2 tablets (200 mg total) by mouth daily.     SUMAtriptan 100 MG tablet  Commonly known as:  IMITREX  Take 1 tablet (100 mg total) by mouth every 2 (two) hours as needed for migraine.     VITAMIN B-12 PO  Take by mouth.        Allergies:  Allergies  Allergen Reactions  . Hydrocodone     "makes me crawl on the floor"  . Aspirin Other (See Comments)    Due to ulcers  . Penicillins Swelling    Past Medical History, Surgical history, Social history, and Family History were reviewed and updated.  Review of Systems: All other 10 point review of systems  is negative.   Physical Exam:  vitals were not taken for this visit.  Wt Readings from Last 3 Encounters:  04/04/15 141 lb (63.957 kg)  03/14/15 143 lb (64.864 kg)  03/04/15 146 lb (66.225 kg)    Ocular: Sclerae unicteric, pupils equal, round and reactive to light Ear-nose-throat: Oropharynx clear, dentition fair Lymphatic: No cervical or supraclavicular adenopathy Lungs no rales or rhonchi, good excursion bilaterally Heart regular rate and rhythm, no murmur appreciated Abd soft, nontender, positive bowel sounds MSK no focal spinal tenderness, no joint edema Neuro: non-focal, well-oriented, appropriate affect Breasts: Wound opening has closed. The area does bleed a little if bumped. No changes to her breasts. No lymphadenopathy.  Lab Results  Component Value Date   WBC 5.1 04/04/2015   HGB 10.9* 04/04/2015   HCT 32.5* 04/04/2015   MCV 97 04/04/2015   PLT 118* 04/04/2015   Lab Results  Component Value Date   FERRITIN 151 02/21/2015   IRON 47 02/21/2015   TIBC 398 02/21/2015   UIBC 351 02/21/2015   IRONPCTSAT 12* 02/21/2015   Lab Results  Component Value Date   RBC 3.35* 04/04/2015   No results found for: KPAFRELGTCHN, LAMBDASER, KAPLAMBRATIO No results found for: IGGSERUM, IGA, IGMSERUM No results found for: Odetta Pink, SPEI   Chemistry      Component Value Date/Time   NA 140 04/04/2015 0927   NA 137 04/09/2014 0450   K 4.1 04/04/2015 0927   K 3.5* 04/09/2014 0450   CL 103 04/04/2015 0927   CL 101 04/09/2014 0450   CO2 26 04/04/2015 0927   CO2 23 04/09/2014 0450   BUN 21 04/04/2015 0927   BUN 5* 04/09/2014 0450   CREATININE 0.7 04/04/2015 0927   CREATININE 0.59 04/09/2014 0450      Component Value Date/Time   CALCIUM 8.7 04/04/2015 0927   CALCIUM 9.3 04/09/2014 0450   ALKPHOS 172* 04/04/2015 0927   ALKPHOS 179* 04/07/2014 0630   AST 95* 04/04/2015 0927   AST 74* 04/07/2014 0630   ALT 266*  04/04/2015 0927   ALT 34 04/07/2014 0630   BILITOT 0.60 04/04/2015 0927   BILITOT 0.7 04/07/2014 0630     Impression and Plan: Mary [name] is 59 year old Afro-American female with metastatic breast cancer. She is feeling better but still experiencing headaches and mild dizziness. MRI revealed at least 33 new brain metastasis. She has completed radiation therapy to the whole brain. She is still having fatigue and  dizzy spells but has had no more falls.  We will proceed with cycle 3 of Carboplatin/Gemcitabine today as planned.  Her LFT's are slightly improved. We will continue to monitor this.   She will have her MUGA scan on the 21st.  She has her appointment schedule.   All questions were answered and she is in agreement with the plan.  She knows to call here with any questions or concerns and to go to the ED in the event of an emergency. We can certainly see her again sooner if needed.   Eliezer Bottom, NP 4/14/20169:12 AM

## 2015-04-12 ENCOUNTER — Ambulatory Visit: Payer: Medicaid Other

## 2015-04-18 ENCOUNTER — Ambulatory Visit (HOSPITAL_COMMUNITY)
Admission: RE | Admit: 2015-04-18 | Discharge: 2015-04-18 | Disposition: A | Discharge status: Home / Self Care | Payer: Medicaid Other | Source: Ambulatory Visit | Attending: Family | Admitting: Family

## 2015-04-18 DIAGNOSIS — C50919 Malignant neoplasm of unspecified site of unspecified female breast: Secondary | ICD-10-CM | POA: Insufficient documentation

## 2015-04-18 DIAGNOSIS — C799 Secondary malignant neoplasm of unspecified site: Secondary | ICD-10-CM | POA: Insufficient documentation

## 2015-04-18 MED ORDER — TECHNETIUM TC 99M-LABELED RED BLOOD CELLS IV KIT
21.7000 | PACK | Freq: Once | INTRAVENOUS | Status: AC | PRN
  Administered 2015-04-18: 22 via INTRAVENOUS

## 2015-04-24 ENCOUNTER — Other Ambulatory Visit: Payer: Self-pay | Admitting: Nurse Practitioner

## 2015-04-24 DIAGNOSIS — C50911 Malignant neoplasm of unspecified site of right female breast: Secondary | ICD-10-CM

## 2015-04-24 NOTE — Progress Notes (Signed)
Radiation Oncology         [phone] ________________________________  Name: Mary [name] MRN: [id number]  Date: 04/25/2015  DOB: [birth year]  Follow-Up Visit Note  CC: [name], MD  Volanda Napoleon, MD  Diagnosis:   59 year old woman with at least 85 brain metastases from cancer of the upper outer right breast-stage IV    ICD-9-CM ICD-10-CM   1. over 75 brain metastases, several showing hemorrhage 198.3 C79.31     Interval Since Last Radiation:  4  weeks  Narrative:  The patient returns today for routine follow-up.  Patient reports that she has a mild headache today. Patient explains her headaches are intermittent and mostly occipital. Reports an occasional frontal headache. Reports taking oxycodone for headache relief. Denies nausea, vomiting, diplopia or ringing in the ears. Patient reports that occasionally while walking she feels off balance. Reports fatigue. Reports she "sleeps a lot." Reports a normal appetite. Denies difficulty sleeping. Reports that she has tapered off decadron. Reports she has noted a decline in her short term memory. 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 Hyperpigmentation of forehead is fading without desquamation. Requesting refill of oxycodone                              ALLERGIES:  is allergic to hydrocodone; aspirin; and penicillins.  Meds: Current Outpatient Prescriptions  Medication Sig Dispense Refill  . acidophilus (RISAQUAD) CAPS capsule Take 1 capsule by mouth daily.    Marland Kitchen amLODipine (NORVASC) 5 MG tablet Take 1 tablet (5 mg total) by mouth every morning. 30 tablet 4  . Cyanocobalamin (VITAMIN B-12 PO) Take by mouth.    . Diphenhydramine-APAP, sleep, (HEADACHE RELIEF PM) 38-500 MG TABS Take 2 tablets by mouth daily as needed (For headaches.).     Marland Kitchen escitalopram (LEXAPRO) 20 MG tablet Take 1 tablet (20 mg total) by mouth every morning. 30 tablet 4  . lisinopril (PRINIVIL,ZESTRIL) 40 MG tablet Take 1 tablet (40 mg total) by mouth every  morning. 30 tablet 4  . LORazepam (ATIVAN) 0.5 MG tablet Take 1 tablet (0.5 mg total) by mouth every 6 (six) hours as needed (Nausea or vomiting). 30 tablet 0  . Multiple Vitamin (MULTIVITAMIN WITH MINERALS) TABS tablet Take 1 tablet by mouth daily.    . ondansetron (ZOFRAN) 8 MG tablet Take 1 tablet (8 mg total) by mouth 2 (two) times daily. Start the day after chemo for 3 days. Then take as needed for nausea or vomiting. 30 tablet 1  . oxyCODONE (OXY IR/ROXICODONE) 5 MG immediate release tablet Take 1-3 tablets (5-15 mg total) by mouth every 6 (six) hours as needed for severe pain. Take 1-2 if needed for pain due to cancer. 240 tablet 0  . pantoprazole (PROTONIX) 40 MG tablet Take 1 tablet (40 mg total) by mouth daily. 30 tablet 4  . potassium chloride SA (K-DUR,KLOR-CON) 20 MEQ tablet Take 1 tablet (20 mEq total) by mouth 1 day or 1 dose. 30 tablet 0  . potassium chloride SA (K-DUR,KLOR-CON) 20 MEQ tablet Take 2 tablets (40 mEq total) by mouth daily. Take 2 tablets (40 meq ) by mouth daily x 5 days, then take 20 meq daily. 40 tablet 2  . pyridOXINE (VITAMIN B-6) 100 MG tablet Take 2 tablets (200 mg total) by mouth daily. 60 tablet 11  . SUMAtriptan (IMITREX) 100 MG tablet Take 1 tablet (100 mg total) by mouth every 2 (two) hours as needed for  migraine. 10 tablet 3  . dexamethasone (DECADRON) 4 MG tablet Take 2 tablets (8 mg total) by mouth 2 (two) times daily. (Patient not taking: Reported on 04/25/2015) 180 tablet 4  . emollient (BIAFINE) cream Apply topically as needed.     No current facility-administered medications for this encounter.    Physical Findings: The patient is in no acute distress. Patient is alert and oriented.  weight is 152 lb 8 oz (69.174 kg). Her oral temperature is 98 F (36.7 C). Her blood pressure is 165/98 and her pulse is 91. Her respiration is 16 and oxygen saturation is 100%. .  Hyperpigmentation of scalp.  No significant changes.  Lab Findings: Lab Results    Component Value Date   WBC 3.2* 04/25/2015   WBC 6.7 04/20/2014   HGB 9.4* 04/25/2015   HGB 9.6* 04/20/2014   HCT 28.3* 04/25/2015   HCT 29.1* 04/20/2014   PLT 65* 04/25/2015   PLT 451* 04/20/2014    Lab Results  Component Value Date   NA 141 04/25/2015   NA 137 04/09/2014   K 2.8* 04/25/2015   K 3.5* 04/09/2014   CO2 28 04/25/2015   CO2 23 04/09/2014   GLUCOSE 98 04/25/2015   GLUCOSE 98 04/09/2014   BUN 12 04/25/2015   BUN 5* 04/09/2014   CREATININE 0.6 04/25/2015   CREATININE 0.59 04/09/2014   BILITOT 0.80 04/25/2015   BILITOT 0.7 04/07/2014   ALKPHOS 134* 04/25/2015   ALKPHOS 179* 04/07/2014   AST 61* 04/25/2015   AST 74* 04/07/2014   ALT 99* 04/25/2015   ALT 34 04/07/2014   PROT 6.0* 04/25/2015   PROT 6.7 04/07/2014   ALBUMIN 2.4* 04/07/2014   CALCIUM 8.8 04/25/2015   CALCIUM 9.3 04/09/2014    Radiographic Findings: Nm Cardiac Muga Rest  04/18/2015   CLINICAL DATA:  Breast cancer, Herceptin therapy  EXAM: NUCLEAR MEDICINE CARDIAC BLOOD POOL IMAGING (MUGA)  TECHNIQUE: Cardiac multi-gated acquisition was performed at rest following intravenous injection of Tc-56m labeled red blood cells.  RADIOPHARMACEUTICALS:  21.7 mCi Tc-41m in-vitro labeled red blood cells.  COMPARISON:  11/06/2014  FINDINGS: LEFT ventricular ejection fraction is calculated at 56%, slightly decreased from the 62% on the previous exam.  Wall motion analysis of the LEFT ventricle in 3 projections shows normal LEFT ventricular wall motion.  IMPRESSION: Normal LEFT ventricular ejection fraction of 56%, slightly decreased from the 62% on the previous exam.  Normal LEFT ventricular wall motion.   Electronically Signed   By: Lavonia Dana M.D.   On: 04/18/2015 12:11    Impression:  The patient is recovering from the effects of radiation.    Plan:  MRI in 1 month, then, review in brain conference and then follow-up.  This document serves as a record of services personally performed by Tyler Pita,  MD. It was created on his behalf by Arlyce Harman, a trained medical scribe. The creation of this record is based on the scribe's personal observations and the provider's statements to them. This document has been checked and approved by the attending provider.      _____________________________________  Sheral Apley. Tammi Klippel, M.D.

## 2015-04-25 ENCOUNTER — Ambulatory Visit (HOSPITAL_BASED_OUTPATIENT_CLINIC_OR_DEPARTMENT_OTHER): Payer: Medicaid Other | Admitting: Hematology & Oncology

## 2015-04-25 ENCOUNTER — Other Ambulatory Visit: Payer: Self-pay | Admitting: *Deleted

## 2015-04-25 ENCOUNTER — Other Ambulatory Visit (HOSPITAL_BASED_OUTPATIENT_CLINIC_OR_DEPARTMENT_OTHER): Payer: Medicaid Other

## 2015-04-25 ENCOUNTER — Encounter: Payer: Self-pay | Admitting: Hematology & Oncology

## 2015-04-25 ENCOUNTER — Encounter: Payer: Self-pay | Admitting: Radiation Oncology

## 2015-04-25 ENCOUNTER — Ambulatory Visit (HOSPITAL_BASED_OUTPATIENT_CLINIC_OR_DEPARTMENT_OTHER): Payer: Medicaid Other

## 2015-04-25 ENCOUNTER — Ambulatory Visit
Admission: RE | Admit: 2015-04-25 | Discharge: 2015-04-25 | Disposition: A | Discharge status: Home / Self Care | Payer: Medicaid Other | Source: Ambulatory Visit | Attending: Radiation Oncology | Admitting: Radiation Oncology

## 2015-04-25 VITALS — BP 146/84 | HR 92 | Temp 98.4°F | Resp 14 | Ht 64.0 in | Wt 145.0 lb

## 2015-04-25 VITALS — BP 165/98 | HR 91 | Temp 98.0°F | Resp 16 | Wt 152.5 lb

## 2015-04-25 DIAGNOSIS — C7951 Secondary malignant neoplasm of bone: Secondary | ICD-10-CM | POA: Diagnosis not present

## 2015-04-25 DIAGNOSIS — C50919 Malignant neoplasm of unspecified site of unspecified female breast: Secondary | ICD-10-CM

## 2015-04-25 DIAGNOSIS — C7931 Secondary malignant neoplasm of brain: Secondary | ICD-10-CM | POA: Diagnosis not present

## 2015-04-25 DIAGNOSIS — Z5111 Encounter for antineoplastic chemotherapy: Secondary | ICD-10-CM | POA: Diagnosis present

## 2015-04-25 DIAGNOSIS — C50911 Malignant neoplasm of unspecified site of right female breast: Secondary | ICD-10-CM

## 2015-04-25 DIAGNOSIS — C787 Secondary malignant neoplasm of liver and intrahepatic bile duct: Secondary | ICD-10-CM

## 2015-04-25 LAB — CBC WITH DIFFERENTIAL (CANCER CENTER ONLY)
BASO#: 0 10*3/uL (ref 0.0–0.2)
BASO%: 0.3 % (ref 0.0–2.0)
EOS ABS: 0 10*3/uL (ref 0.0–0.5)
EOS%: 0.3 % (ref 0.0–7.0)
HCT: 28.3 % — ABNORMAL LOW (ref 34.8–46.6)
HEMOGLOBIN: 9.4 g/dL — AB (ref 11.6–15.9)
LYMPH#: 0.6 10*3/uL — AB (ref 0.9–3.3)
LYMPH%: 17.8 % (ref 14.0–48.0)
MCH: 33.1 pg (ref 26.0–34.0)
MCHC: 33.2 g/dL (ref 32.0–36.0)
MCV: 100 fL (ref 81–101)
MONO#: 0.3 10*3/uL (ref 0.1–0.9)
MONO%: 9.2 % (ref 0.0–13.0)
NEUT%: 72.4 % (ref 39.6–80.0)
NEUTROS ABS: 2.3 10*3/uL (ref 1.5–6.5)
Platelets: 65 10*3/uL — ABNORMAL LOW (ref 145–400)
RBC: 2.84 10*6/uL — AB (ref 3.70–5.32)
RDW: 19.2 % — ABNORMAL HIGH (ref 11.1–15.7)
WBC: 3.2 10*3/uL — ABNORMAL LOW (ref 3.9–10.0)

## 2015-04-25 LAB — CMP (CANCER CENTER ONLY)
ALBUMIN: 3 g/dL — AB (ref 3.3–5.5)
ALK PHOS: 134 U/L — AB (ref 26–84)
ALT(SGPT): 99 U/L — ABNORMAL HIGH (ref 10–47)
AST: 61 U/L — ABNORMAL HIGH (ref 11–38)
BUN: 12 mg/dL (ref 7–22)
CO2: 28 mEq/L (ref 18–33)
Calcium: 8.8 mg/dL (ref 8.0–10.3)
Chloride: 107 mEq/L (ref 98–108)
Creat: 0.6 mg/dl (ref 0.6–1.2)
Glucose, Bld: 98 mg/dL (ref 73–118)
POTASSIUM: 2.8 meq/L — AB (ref 3.3–4.7)
SODIUM: 141 meq/L (ref 128–145)
Total Bilirubin: 0.8 mg/dl (ref 0.20–1.60)
Total Protein: 6 g/dL — ABNORMAL LOW (ref 6.4–8.1)

## 2015-04-25 MED ORDER — PROCHLORPERAZINE MALEATE 10 MG PO TABS
10.0000 mg | ORAL_TABLET | Freq: Once | ORAL | Status: AC
  Administered 2015-04-25: 10 mg via ORAL

## 2015-04-25 MED ORDER — SODIUM CHLORIDE 0.9 % IV SOLN
Freq: Once | INTRAVENOUS | Status: AC
  Administered 2015-04-25: 11:00:00 via INTRAVENOUS

## 2015-04-25 MED ORDER — POTASSIUM CHLORIDE CRYS ER 20 MEQ PO TBCR: 40.0000 meq | EXTENDED_RELEASE_TABLET | Freq: Every day | ORAL | Status: DC

## 2015-04-25 MED ORDER — SODIUM CHLORIDE 0.9 % IV SOLN
800.0000 mg/m2 | Freq: Once | INTRAVENOUS | Status: AC
  Administered 2015-04-25: 1368 mg via INTRAVENOUS
  Filled 2015-04-25: qty 30.72

## 2015-04-25 MED ORDER — HEPARIN SOD (PORK) LOCK FLUSH 100 UNIT/ML IV SOLN
500.0000 [IU] | Freq: Once | INTRAVENOUS | Status: AC | PRN
  Administered 2015-04-25: 500 [IU]
  Filled 2015-04-25: qty 5

## 2015-04-25 MED ORDER — OXYCODONE HCL 5 MG PO TABS: 5.0000 mg | ORAL_TABLET | Freq: Four times a day (QID) | ORAL | Status: DC | PRN

## 2015-04-25 MED ORDER — PEGFILGRASTIM 6 MG/0.6ML ~~LOC~~ PSKT
6.0000 mg | PREFILLED_SYRINGE | Freq: Once | SUBCUTANEOUS | Status: AC
  Administered 2015-04-25: 6 mg via SUBCUTANEOUS
  Filled 2015-04-25: qty 0.6

## 2015-04-25 MED ORDER — SODIUM CHLORIDE 0.9 % IJ SOLN
10.0000 mL | INTRAMUSCULAR | Status: DC | PRN
  Administered 2015-04-25: 10 mL
  Filled 2015-04-25: qty 10

## 2015-04-25 MED ORDER — PROCHLORPERAZINE MALEATE 10 MG PO TABS
ORAL_TABLET | ORAL | Status: AC
  Filled 2015-04-25: qty 1

## 2015-04-25 NOTE — Progress Notes (Signed)
Hematology and Oncology Follow Up Visit  QUEEN ABBETT [id number] [birth year] 59 y.o. 04/25/2015   Principle Diagnosis:   Metastatic breast cancer-triple positive  Current Therapy:   Status post 2 cycles of carboplatin/Gemzar Burley Saver /Herceptin  Zometa 4 mg IV every month      Interim History:  Ms.  Kitchen is back for follow-up.  She looks okay area and she really has had no problems with the chemotherapy with carboplatinum and gemcitabine. Her CA 27.29 is holding pretty steady. The last level was 63.  Her right breast is not bleeding. She has a dressing over the tumor. There is some scant exudate but overall, this appears to be better.  She is not hurting.  She is not having diarrhea.  There is no right arm swelling. There is no leg swelling bilaterally.  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 She tolerated this well. She completed his back in late March.    Overall, her performance status is ECOG 1.  Medications:  Current outpatient prescriptions:  .  acidophilus (RISAQUAD) CAPS capsule, Take 1 capsule by mouth daily., Disp: , Rfl:  .  amLODipine (NORVASC) 5 MG tablet, Take 1 tablet (5 mg total) by mouth every morning., Disp: 30 tablet, Rfl: 4 .  Cyanocobalamin (VITAMIN B-12 PO), Take by mouth., Disp: , Rfl:  .  dexamethasone (DECADRON) 4 MG tablet, Take 2 tablets (8 mg total) by mouth 2 (two) times daily., Disp: 180 tablet, Rfl: 4 .  Diphenhydramine-APAP, sleep, (HEADACHE RELIEF PM) 38-500 MG TABS, Take 2 tablets by mouth daily as needed (For headaches.). , Disp: , Rfl:  .  emollient (BIAFINE) cream, Apply topically as needed., Disp: , Rfl:  .  escitalopram (LEXAPRO) 20 MG tablet, Take 1 tablet (20 mg total) by mouth every morning., Disp: 30 tablet, Rfl: 4 .  lisinopril (PRINIVIL,ZESTRIL) 40 MG tablet, Take 1 tablet (40 mg total) by mouth every morning., Disp: 30 tablet, Rfl: 4 .  LORazepam (ATIVAN) 0.5 MG tablet, Take 1 tablet (0.5 mg total) by mouth every  6 (six) hours as needed (Nausea or vomiting)., Disp: 30 tablet, Rfl: 0 .  Multiple Vitamin (MULTIVITAMIN WITH MINERALS) TABS tablet, Take 1 tablet by mouth daily., Disp: , Rfl:  .  ondansetron (ZOFRAN) 8 MG tablet, Take 1 tablet (8 mg total) by mouth 2 (two) times daily. Start the day after chemo for 3 days. Then take as needed for nausea or vomiting., Disp: 30 tablet, Rfl: 1 .  oxyCODONE (OXY IR/ROXICODONE) 5 MG immediate release tablet, Take 1-3 tablets (5-15 mg total) by mouth every 6 (six) hours as needed for severe pain. Take 1-2 if needed for pain due to cancer., Disp: 240 tablet, Rfl: 0 .  pantoprazole (PROTONIX) 40 MG tablet, Take 1 tablet (40 mg total) by mouth daily., Disp: 30 tablet, Rfl: 4 .  potassium chloride SA (K-DUR,KLOR-CON) 20 MEQ tablet, Take 1 tablet (20 mEq total) by mouth 1 day or 1 dose. (Patient not taking: Reported on 04/04/2015), Disp: 30 tablet, Rfl: 0 .  potassium chloride SA (K-DUR,KLOR-CON) 20 MEQ tablet, Take 2 tablets (40 mEq total) by mouth daily. Take 2 tablets (40 meq ) by mouth daily x 5 days, then take 20 meq daily., Disp: 40 tablet, Rfl: 2 .  pyridOXINE (VITAMIN B-6) 100 MG tablet, Take 2 tablets (200 mg total) by mouth daily., Disp: 60 tablet, Rfl: 11 .  SUMAtriptan (IMITREX) 100 MG tablet, Take 1 tablet (100 mg total) by mouth every 2 (two) hours  as needed for migraine., Disp: 10 tablet, Rfl: 3 No current facility-administered medications for this visit.  Facility-Administered Medications Ordered in Other Visits:  .  sodium chloride 0.9 % injection 10 mL, 10 mL, Intracatheter, PRN, Volanda Napoleon, MD, 10 mL at 04/25/15 1231  Allergies:  Allergies  Allergen Reactions  . Hydrocodone     "makes me crawl on the floor"  . Aspirin Other (See Comments)    Due to ulcers  . Penicillins Swelling    Past Medical History, Surgical history, Social history, and Family History were reviewed and updated.  Review of Systems: As above  Physical Exam:  height is 5'  4" (1.626 m) and weight is 145 lb (65.772 kg). Her oral temperature is 98.4 F (36.9 C). Her blood pressure is 146/84 and her pulse is 92. Her respiration is 14.   Fairly well-built and well-nourished African-American female. She has no ocular or oral lesions. There is no palpable cervical or supraclavicular lymph nodes. Lungs are clear. Cardiac exam regular rate and rhythm with no murmurs, rubs or bruits. Breast exam shows some moderate swelling of the right breast. She still has some firmness about the areola. She does have some palpable right axillary lymph nodes. She has a dressing over her wound site in the right axilla. Left breast is unremarkable remarkable. Abdomen is soft. She has good bowel sounds. There is no fluid wave. There is no palpable liver or spleen tip. Extremity shows no clubbing, cyanosis or edema. Neurological exam shows no focal neurological deficits. Skin exam shows no rashes, ecchymoses or petechia.  Lab Results  Component Value Date   WBC 3.2* 04/25/2015   HGB 9.4* 04/25/2015   HCT 28.3* 04/25/2015   MCV 100 04/25/2015   PLT 65* 04/25/2015     Chemistry      Component Value Date/Time   NA 141 04/25/2015 0917   NA 137 04/09/2014 0450   K 2.8* 04/25/2015 0917   K 3.5* 04/09/2014 0450   CL 107 04/25/2015 0917   CL 101 04/09/2014 0450   CO2 28 04/25/2015 0917   CO2 23 04/09/2014 0450   BUN 12 04/25/2015 0917   BUN 5* 04/09/2014 0450   CREATININE 0.6 04/25/2015 0917   CREATININE 0.59 04/09/2014 0450      Component Value Date/Time   CALCIUM 8.8 04/25/2015 0917   CALCIUM 9.3 04/09/2014 0450   ALKPHOS 134* 04/25/2015 0917   ALKPHOS 179* 04/07/2014 0630   AST 61* 04/25/2015 0917   AST 74* 04/07/2014 0630   ALT 99* 04/25/2015 0917   ALT 34 04/07/2014 0630   BILITOT 0.80 04/25/2015 0917   BILITOT 0.7 04/07/2014 0630         Impression and Plan: Ms. Amble is 59 year old African American female. She is postmenopausal. She is a triple positive metastatic  breast cancer.   we need to go ahead and get her rescanned . I will do this after this cycle of treatment. I will set her up with a CT scan. We will get this set up in about 2 weeks.   clinically, she looks better. Hopefully, we will see a good result with our scan.    Volanda Napoleon, MD 4/28/20163:07 PM

## 2015-04-25 NOTE — Patient Instructions (Signed)

## 2015-04-25 NOTE — Progress Notes (Addendum)
Patient reports that she has a mild headache today. Patient explains her headaches are intermittent and mostly occipital. Reports an occasional frontal headache. Reports taking oxycodone for headache relief. Denies nausea, vomiting, diplopia or ringing in the ears. Patient reports that occasionally while walking she feels off balance. Reports fatigue. Reports she "sleeps a lot." Reports a normal appetite. Denies difficulty sleeping. Reports that she has tapered off decadron. Reports she has noted a decline in her short term memory. 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 Hyperpigmentation of forehead is fading without desquamation. Requesting refill of oxycodone.

## 2015-04-25 NOTE — Addendum Note (Signed)
Encounter addended by: Tyler Pita, MD on: 04/25/2015  4:39 PM<BR>     Documentation filed: [employer], Orders

## 2015-05-02 ENCOUNTER — Other Ambulatory Visit: Payer: Self-pay | Admitting: Radiation Therapy

## 2015-05-02 ENCOUNTER — Other Ambulatory Visit: Payer: Medicaid Other

## 2015-05-02 ENCOUNTER — Ambulatory Visit: Payer: Medicaid Other

## 2015-05-02 ENCOUNTER — Ambulatory Visit: Payer: Medicaid Other | Admitting: Hematology & Oncology

## 2015-05-02 ENCOUNTER — Ambulatory Visit (HOSPITAL_COMMUNITY): Payer: Medicaid Other

## 2015-05-02 DIAGNOSIS — C7931 Secondary malignant neoplasm of brain: Secondary | ICD-10-CM

## 2015-05-09 ENCOUNTER — Ambulatory Visit (HOSPITAL_BASED_OUTPATIENT_CLINIC_OR_DEPARTMENT_OTHER): Payer: Medicaid Other

## 2015-05-09 ENCOUNTER — Other Ambulatory Visit (HOSPITAL_BASED_OUTPATIENT_CLINIC_OR_DEPARTMENT_OTHER): Payer: Medicaid Other

## 2015-05-09 ENCOUNTER — Encounter: Payer: Self-pay | Admitting: Family

## 2015-05-09 ENCOUNTER — Other Ambulatory Visit: Payer: Self-pay | Admitting: *Deleted

## 2015-05-09 ENCOUNTER — Ambulatory Visit (HOSPITAL_BASED_OUTPATIENT_CLINIC_OR_DEPARTMENT_OTHER): Payer: Medicaid Other | Admitting: Family

## 2015-05-09 VITALS — BP 142/85 | HR 82 | Temp 98.3°F | Resp 16 | Wt 143.0 lb

## 2015-05-09 DIAGNOSIS — D509 Iron deficiency anemia, unspecified: Secondary | ICD-10-CM

## 2015-05-09 DIAGNOSIS — C50919 Malignant neoplasm of unspecified site of unspecified female breast: Secondary | ICD-10-CM

## 2015-05-09 DIAGNOSIS — C50911 Malignant neoplasm of unspecified site of right female breast: Secondary | ICD-10-CM | POA: Diagnosis not present

## 2015-05-09 DIAGNOSIS — R42 Dizziness and giddiness: Secondary | ICD-10-CM

## 2015-05-09 DIAGNOSIS — C7931 Secondary malignant neoplasm of brain: Secondary | ICD-10-CM | POA: Diagnosis not present

## 2015-05-09 DIAGNOSIS — Z5111 Encounter for antineoplastic chemotherapy: Secondary | ICD-10-CM

## 2015-05-09 DIAGNOSIS — Z5112 Encounter for antineoplastic immunotherapy: Secondary | ICD-10-CM | POA: Diagnosis present

## 2015-05-09 DIAGNOSIS — C7951 Secondary malignant neoplasm of bone: Secondary | ICD-10-CM | POA: Diagnosis not present

## 2015-05-09 DIAGNOSIS — R51 Headache: Secondary | ICD-10-CM

## 2015-05-09 LAB — CBC WITH DIFFERENTIAL (CANCER CENTER ONLY)
BASO#: 0 10*3/uL (ref 0.0–0.2)
BASO%: 0.2 % (ref 0.0–2.0)
EOS%: 0 % (ref 0.0–7.0)
Eosinophils Absolute: 0 10*3/uL (ref 0.0–0.5)
HEMATOCRIT: 30.3 % — AB (ref 34.8–46.6)
HEMOGLOBIN: 10.2 g/dL — AB (ref 11.6–15.9)
LYMPH#: 0.8 10*3/uL — ABNORMAL LOW (ref 0.9–3.3)
LYMPH%: 13.9 % — AB (ref 14.0–48.0)
MCH: 34.2 pg — AB (ref 26.0–34.0)
MCHC: 33.7 g/dL (ref 32.0–36.0)
MCV: 102 fL — ABNORMAL HIGH (ref 81–101)
MONO#: 1 10*3/uL — ABNORMAL HIGH (ref 0.1–0.9)
MONO%: 17.7 % — ABNORMAL HIGH (ref 0.0–13.0)
NEUT#: 3.8 10*3/uL (ref 1.5–6.5)
NEUT%: 68.2 % (ref 39.6–80.0)
Platelets: 307 10*3/uL (ref 145–400)
RBC: 2.98 10*6/uL — ABNORMAL LOW (ref 3.70–5.32)
RDW: 19.3 % — ABNORMAL HIGH (ref 11.1–15.7)
WBC: 5.5 10*3/uL (ref 3.9–10.0)

## 2015-05-09 LAB — CMP (CANCER CENTER ONLY)
ALK PHOS: 144 U/L — AB (ref 26–84)
ALT(SGPT): 53 U/L — ABNORMAL HIGH (ref 10–47)
AST: 59 U/L — AB (ref 11–38)
Albumin: 3.5 g/dL (ref 3.3–5.5)
BUN: 13 mg/dL (ref 7–22)
CHLORIDE: 105 meq/L (ref 98–108)
CO2: 28 mEq/L (ref 18–33)
CREATININE: 0.8 mg/dL (ref 0.6–1.2)
Calcium: 9.5 mg/dL (ref 8.0–10.3)
Glucose, Bld: 111 mg/dL (ref 73–118)
POTASSIUM: 3.9 meq/L (ref 3.3–4.7)
Sodium: 141 mEq/L (ref 128–145)
Total Bilirubin: 0.8 mg/dl (ref 0.20–1.60)
Total Protein: 6.9 g/dL (ref 6.4–8.1)

## 2015-05-09 MED ORDER — SODIUM CHLORIDE 0.9 % IV SOLN
800.0000 mg/m2 | Freq: Once | INTRAVENOUS | Status: AC
  Administered 2015-05-09: 1368 mg via INTRAVENOUS
  Filled 2015-05-09: qty 35.98

## 2015-05-09 MED ORDER — PANTOPRAZOLE SODIUM 40 MG PO TBEC: 40.0000 mg | DELAYED_RELEASE_TABLET | Freq: Every day | ORAL | Status: DC

## 2015-05-09 MED ORDER — SODIUM CHLORIDE 0.9 % IJ SOLN
10.0000 mL | INTRAMUSCULAR | Status: DC | PRN
  Administered 2015-05-09: 10 mL
  Filled 2015-05-09: qty 10

## 2015-05-09 MED ORDER — ACETAMINOPHEN 325 MG PO TABS
ORAL_TABLET | ORAL | Status: AC
  Filled 2015-05-09: qty 2

## 2015-05-09 MED ORDER — PANTOPRAZOLE SODIUM 40 MG PO TBEC: 40.0000 mg | DELAYED_RELEASE_TABLET | Freq: Two times a day (BID) | ORAL | Status: DC

## 2015-05-09 MED ORDER — SODIUM CHLORIDE 0.9 % IV SOLN
420.0000 mg | Freq: Once | INTRAVENOUS | Status: AC
  Administered 2015-05-09: 420 mg via INTRAVENOUS
  Filled 2015-05-09: qty 14

## 2015-05-09 MED ORDER — ZOLEDRONIC ACID 4 MG/5ML IV CONC
4.0000 mg | Freq: Once | INTRAVENOUS | Status: AC
  Administered 2015-05-09: 4 mg via INTRAVENOUS
  Filled 2015-05-09: qty 5

## 2015-05-09 MED ORDER — ESCITALOPRAM OXALATE 20 MG PO TABS: 20.0000 mg | ORAL_TABLET | Freq: Every morning | ORAL | Status: DC

## 2015-05-09 MED ORDER — TRASTUZUMAB CHEMO INJECTION 440 MG
6.0000 mg/kg | Freq: Once | INTRAVENOUS | Status: AC
  Administered 2015-05-09: 399 mg via INTRAVENOUS
  Filled 2015-05-09: qty 19

## 2015-05-09 MED ORDER — ACETAMINOPHEN 325 MG PO TABS
650.0000 mg | ORAL_TABLET | Freq: Once | ORAL | Status: AC
  Administered 2015-05-09: 650 mg via ORAL

## 2015-05-09 MED ORDER — FLUCONAZOLE 100 MG PO TABS: 100.0000 mg | ORAL_TABLET | Freq: Every day | ORAL | Status: DC

## 2015-05-09 MED ORDER — CARBOPLATIN CHEMO INJECTION 600 MG/60ML
523.0000 mg | Freq: Once | INTRAVENOUS | Status: AC
  Administered 2015-05-09: 520 mg via INTRAVENOUS
  Filled 2015-05-09: qty 52

## 2015-05-09 MED ORDER — DIPHENHYDRAMINE HCL 25 MG PO CAPS
50.0000 mg | ORAL_CAPSULE | Freq: Once | ORAL | Status: AC
  Administered 2015-05-09: 50 mg via ORAL

## 2015-05-09 MED ORDER — LISINOPRIL 40 MG PO TABS: 40.0000 mg | ORAL_TABLET | Freq: Every morning | ORAL | Status: DC

## 2015-05-09 MED ORDER — HEPARIN SOD (PORK) LOCK FLUSH 100 UNIT/ML IV SOLN
500.0000 [IU] | Freq: Once | INTRAVENOUS | Status: AC | PRN
  Administered 2015-05-09: 500 [IU]
  Filled 2015-05-09: qty 5

## 2015-05-09 MED ORDER — DIPHENHYDRAMINE HCL 25 MG PO CAPS
ORAL_CAPSULE | ORAL | Status: AC
  Filled 2015-05-09: qty 2

## 2015-05-09 MED ORDER — AMLODIPINE BESYLATE 5 MG PO TABS: 5.0000 mg | ORAL_TABLET | Freq: Every morning | ORAL | Status: DC

## 2015-05-09 MED ORDER — DRONABINOL 2.5 MG PO CAPS: 2.5000 mg | ORAL_CAPSULE | Freq: Two times a day (BID) | ORAL | Status: DC

## 2015-05-09 MED ORDER — LORAZEPAM 0.5 MG PO TABS: 0.5000 mg | ORAL_TABLET | Freq: Four times a day (QID) | ORAL | Status: DC | PRN

## 2015-05-09 MED ORDER — SODIUM CHLORIDE 0.9 % IV SOLN
Freq: Once | INTRAVENOUS | Status: AC
  Administered 2015-05-09: 11:00:00 via INTRAVENOUS
  Filled 2015-05-09: qty 8

## 2015-05-09 MED ORDER — SODIUM CHLORIDE 0.9 % IV SOLN
Freq: Once | INTRAVENOUS | Status: AC
  Administered 2015-05-09: 11:00:00 via INTRAVENOUS

## 2015-05-09 NOTE — Progress Notes (Signed)
Hematology and Oncology Follow Up Visit  KARCYN MENN [id number] [birth year] 59 y.o. 05/09/2015   Principle Diagnosis:  Metastatic breast cancer- Triple positive  Current Therapy:   Herceptin/Perjeta/Carboplatin/Gemcitabine q 21 days s/p cycle 3 Zometa 4 mg IV Q3 weeks    Interim History: Ms. Vanderkolk is here today for a follow-up and cycle 4 of Carboplatin/Gemcitabine.  MRI of the brain in March showed at least 38 new brain metastasis. She completed radiation treatment to the whole head in March.  She is still feeling fatigued. She denies fever, chills, n/v, rash, cough, chest pain, palpitations, abdominal pain, constipation, diarrhea, problems urinating, blood in urine or stool. She has some SOB with exertion.  She is still having headaches and some dizziness. No more falls or syncopal episodes.  She denies swelling, tenderness or numbness in her extremities. The tingling in her fingers is still unchanged. Her appetite is down and she has not been eating or drinking well. Her weight is down 9 lbs this visit.  In March, her CA 27.29 was down to 63 and LDH was 201. Her MUGA scan in May showed a slight decrease in her EF to 56%.   Medications:    Medication List       This list is accurate as of: 05/09/15  9:26 AM.  Always use your most recent med list.               acidophilus Caps capsule  Take 1 capsule by mouth daily.     amLODipine 5 MG tablet  Commonly known as:  NORVASC  Take 1 tablet (5 mg total) by mouth every morning.     dexamethasone 4 MG tablet  Commonly known as:  DECADRON  Take 2 tablets (8 mg total) by mouth 2 (two) times daily.     emollient cream  Commonly known as:  BIAFINE  Apply topically as needed.     escitalopram 20 MG tablet  Commonly known as:  LEXAPRO  Take 1 tablet (20 mg total) by mouth every morning.     HEADACHE RELIEF PM 38-500 MG Tabs  Generic drug:  Diphenhydramine-APAP (sleep)  Take 2 tablets by mouth daily as needed (For  headaches.).     lisinopril 40 MG tablet  Commonly known as:  PRINIVIL,ZESTRIL  Take 1 tablet (40 mg total) by mouth every morning.     LORazepam 0.5 MG tablet  Commonly known as:  ATIVAN  Take 1 tablet (0.5 mg total) by mouth every 6 (six) hours as needed (Nausea or vomiting).     multivitamin with minerals Tabs tablet  Take 1 tablet by mouth daily.     ondansetron 8 MG tablet  Commonly known as:  ZOFRAN  Take 1 tablet (8 mg total) by mouth 2 (two) times daily. Start the day after chemo for 3 days. Then take as needed for nausea or vomiting.     oxyCODONE 5 MG immediate release tablet  Commonly known as:  Oxy IR/ROXICODONE  Take 1-3 tablets (5-15 mg total) by mouth every 6 (six) hours as needed for severe pain. Take 1-2 if needed for pain due to cancer.     pantoprazole 40 MG tablet  Commonly known as:  PROTONIX  Take 1 tablet (40 mg total) by mouth daily.     potassium chloride SA 20 MEQ tablet  Commonly known as:  K-DUR,KLOR-CON  Take 1 tablet (20 mEq total) by mouth 1 day or 1 dose.     potassium chloride SA 20  MEQ tablet  Commonly known as:  K-DUR,KLOR-CON  Take 2 tablets (40 mEq total) by mouth daily. Take 2 tablets (40 meq ) by mouth daily x 5 days, then take 20 meq daily.     pyridOXINE 100 MG tablet  Commonly known as:  VITAMIN B-6  Take 2 tablets (200 mg total) by mouth daily.     SUMAtriptan 100 MG tablet  Commonly known as:  IMITREX  Take 1 tablet (100 mg total) by mouth every 2 (two) hours as needed for migraine.     VITAMIN B-12 PO  Take by mouth.        Allergies:  Allergies  Allergen Reactions  . Hydrocodone     "makes me crawl on the floor"  . Aspirin Other (See Comments)    Due to ulcers  . Penicillins Swelling    Past Medical History, Surgical history, Social history, and Family History were reviewed and updated.  Review of Systems: All other 10 point review of systems is negative.   Physical Exam:  vitals were not taken for this  visit.  Wt Readings from Last 3 Encounters:  04/25/15 152 lb 8 oz (69.174 kg)  04/25/15 145 lb (65.772 kg)  04/11/15 145 lb (65.772 kg)    Ocular: Sclerae unicteric, pupils equal, round and reactive to light Ear-nose-throat: Oropharynx clear, dentition fair Lymphatic: No cervical or supraclavicular adenopathy Lungs no rales or rhonchi, good excursion bilaterally Heart regular rate and rhythm, no murmur appreciated Abd soft, nontender, positive bowel sounds MSK no focal spinal tenderness, no joint edema Neuro: non-focal, well-oriented, appropriate affect Breasts: The wound to he right breast is improved but does bleed a little at times when bumped. No new mass, lesion, rash or lymphadenopathy.   Lab Results  Component Value Date   WBC 3.2* 04/25/2015   HGB 9.4* 04/25/2015   HCT 28.3* 04/25/2015   MCV 100 04/25/2015   PLT 65* 04/25/2015   Lab Results  Component Value Date   FERRITIN 151 02/21/2015   IRON 47 02/21/2015   TIBC 398 02/21/2015   UIBC 351 02/21/2015   IRONPCTSAT 12* 02/21/2015   Lab Results  Component Value Date   RBC 2.84* 04/25/2015   No results found for: KPAFRELGTCHN, LAMBDASER, KAPLAMBRATIO No results found for: IGGSERUM, IGA, IGMSERUM No results found for: Odetta Pink, SPEI   Chemistry      Component Value Date/Time   NA 141 04/25/2015 0917   NA 137 04/09/2014 0450   K 2.8* 04/25/2015 0917   K 3.5* 04/09/2014 0450   CL 107 04/25/2015 0917   CL 101 04/09/2014 0450   CO2 28 04/25/2015 0917   CO2 23 04/09/2014 0450   BUN 12 04/25/2015 0917   BUN 5* 04/09/2014 0450   CREATININE 0.6 04/25/2015 0917   CREATININE 0.59 04/09/2014 0450      Component Value Date/Time   CALCIUM 8.8 04/25/2015 0917   CALCIUM 9.3 04/09/2014 0450   ALKPHOS 134* 04/25/2015 0917   ALKPHOS 179* 04/07/2014 0630   AST 61* 04/25/2015 0917   AST 74* 04/07/2014 0630   ALT 99* 04/25/2015 0917   ALT 34 04/07/2014 0630    BILITOT 0.80 04/25/2015 0917   BILITOT 0.7 04/07/2014 0630     Impression and Plan: Ms. Taketa is 59 year old Afro-American female with metastatic breast cancer. She is feeling better but still experiencing headaches and mild dizziness. MRI revealed at least 76 new brain metastasis. She has completed radiation therapy to  the whole brain.  She has had some weight loss and fatigue.  We will start her on Marinol 2.5 mg BID with meals to help stimulate her appetite.  We will proceed with cycle 4 of Carboplatin/Gemcitabine today as planned.  She has CT scans of the chest abdomen and pelvis tomorrow. She also has a Brain MRI scheduled for May 23rd.  Her LFT's continue to improve.  We will increase her Protonix to 40 mg BID for indigestion.  She has her appointment schedule.   All questions were answered and she is in agreement with the plan.  She knows to call here with any questions or concerns and to go to the ED in the event of an emergency. We can certainly see her again sooner if needed.   Eliezer Bottom, NP 5/12/20169:26 AM

## 2015-05-09 NOTE — Patient Instructions (Signed)
[facility] Discharge Instructions for Patients Receiving Chemotherapy  Today you received the following chemotherapy agents Herceptin, Perjeta, Gemzar and Carboplatin.  To help prevent nausea and vomiting after your treatment, we encourage you to take your nausea medication.   If you develop nausea and vomiting that is not controlled by your nausea medication, call the clinic.   BELOW ARE SYMPTOMS THAT SHOULD BE REPORTED IMMEDIATELY:  *FEVER GREATER THAN 100.5 F  *CHILLS WITH OR WITHOUT FEVER  NAUSEA AND VOMITING THAT IS NOT CONTROLLED WITH YOUR NAUSEA MEDICATION  *UNUSUAL SHORTNESS OF BREATH  *UNUSUAL BRUISING OR BLEEDING  TENDERNESS IN MOUTH AND THROAT WITH OR WITHOUT PRESENCE OF ULCERS  *URINARY PROBLEMS  *BOWEL PROBLEMS  UNUSUAL RASH Items with * indicate a potential emergency and should be followed up as soon as possible.  Feel free to call the clinic you have any questions or concerns. The clinic phone number is [phone].  Please show the [facility] at check-in to the Emergency Department and triage nurse.

## 2015-05-10 ENCOUNTER — Ambulatory Visit (HOSPITAL_COMMUNITY): Payer: Medicaid Other

## 2015-05-10 LAB — LACTATE DEHYDROGENASE: LDH: 220 U/L (ref 94–250)

## 2015-05-10 LAB — CANCER ANTIGEN 27.29: CA 27.29: 55 U/mL — ABNORMAL HIGH (ref 0–39)

## 2015-05-13 ENCOUNTER — Telehealth: Payer: Self-pay | Admitting: *Deleted

## 2015-05-13 ENCOUNTER — Encounter (HOSPITAL_COMMUNITY): Payer: Self-pay

## 2015-05-13 ENCOUNTER — Ambulatory Visit (HOSPITAL_COMMUNITY)
Admission: RE | Admit: 2015-05-13 | Discharge: 2015-05-13 | Disposition: A | Discharge status: Home / Self Care | Payer: Medicaid Other | Source: Ambulatory Visit | Attending: Hematology & Oncology | Admitting: Hematology & Oncology

## 2015-05-13 DIAGNOSIS — C50911 Malignant neoplasm of unspecified site of right female breast: Secondary | ICD-10-CM | POA: Diagnosis not present

## 2015-05-13 DIAGNOSIS — C787 Secondary malignant neoplasm of liver and intrahepatic bile duct: Secondary | ICD-10-CM | POA: Diagnosis present

## 2015-05-13 MED ORDER — IOHEXOL 300 MG/ML  SOLN
100.0000 mL | Freq: Once | INTRAMUSCULAR | Status: AC | PRN
  Administered 2015-05-13: 100 mL via INTRAVENOUS

## 2015-05-13 NOTE — Telephone Encounter (Addendum)
Patient aware of results  ----- Message from Volanda Napoleon, MD sent at 05/13/2015  2:47 PM EDT ----- Please call and tell her that her cancer is actually a bit better. There may be a couple new small spots but these are very small and should not cause any problems. Her liver lesions also look a little bit better. Thanks

## 2015-05-16 ENCOUNTER — Ambulatory Visit (HOSPITAL_BASED_OUTPATIENT_CLINIC_OR_DEPARTMENT_OTHER): Payer: Medicaid Other

## 2015-05-16 ENCOUNTER — Ambulatory Visit: Payer: Medicaid Other

## 2015-05-16 ENCOUNTER — Other Ambulatory Visit: Payer: Medicaid Other

## 2015-05-16 ENCOUNTER — Telehealth: Payer: Self-pay | Admitting: *Deleted

## 2015-05-16 ENCOUNTER — Ambulatory Visit: Payer: Medicaid Other | Admitting: Hematology & Oncology

## 2015-05-16 ENCOUNTER — Other Ambulatory Visit (HOSPITAL_BASED_OUTPATIENT_CLINIC_OR_DEPARTMENT_OTHER): Payer: Medicaid Other

## 2015-05-16 VITALS — BP 135/83 | HR 91 | Temp 98.2°F | Resp 16

## 2015-05-16 DIAGNOSIS — C7800 Secondary malignant neoplasm of unspecified lung: Secondary | ICD-10-CM | POA: Diagnosis not present

## 2015-05-16 DIAGNOSIS — C50911 Malignant neoplasm of unspecified site of right female breast: Secondary | ICD-10-CM

## 2015-05-16 DIAGNOSIS — C50919 Malignant neoplasm of unspecified site of unspecified female breast: Secondary | ICD-10-CM

## 2015-05-16 DIAGNOSIS — C787 Secondary malignant neoplasm of liver and intrahepatic bile duct: Secondary | ICD-10-CM

## 2015-05-16 DIAGNOSIS — C7951 Secondary malignant neoplasm of bone: Secondary | ICD-10-CM | POA: Diagnosis not present

## 2015-05-16 LAB — CBC WITH DIFFERENTIAL (CANCER CENTER ONLY)
BASO#: 0 10*3/uL (ref 0.0–0.2)
BASO%: 0 % (ref 0.0–2.0)
EOS%: 0 % (ref 0.0–7.0)
Eosinophils Absolute: 0 10*3/uL (ref 0.0–0.5)
HEMATOCRIT: 29.4 % — AB (ref 34.8–46.6)
HEMOGLOBIN: 10 g/dL — AB (ref 11.6–15.9)
LYMPH#: 0.3 10*3/uL — ABNORMAL LOW (ref 0.9–3.3)
LYMPH%: 38.2 % (ref 14.0–48.0)
MCH: 34.4 pg — ABNORMAL HIGH (ref 26.0–34.0)
MCHC: 34 g/dL (ref 32.0–36.0)
MCV: 101 fL (ref 81–101)
MONO#: 0 10*3/uL — ABNORMAL LOW (ref 0.1–0.9)
MONO%: 2.2 % (ref 0.0–13.0)
NEUT#: 0.5 10*3/uL — CL (ref 1.5–6.5)
NEUT%: 59.6 % (ref 39.6–80.0)
Platelets: 236 10*3/uL (ref 145–400)
RBC: 2.91 10*6/uL — ABNORMAL LOW (ref 3.70–5.32)
RDW: 16.7 % — AB (ref 11.1–15.7)
WBC: 0.9 10*3/uL — CL (ref 3.9–10.0)

## 2015-05-16 LAB — CMP (CANCER CENTER ONLY)
ALT(SGPT): 129 U/L — ABNORMAL HIGH (ref 10–47)
AST: 128 U/L — ABNORMAL HIGH (ref 11–38)
Albumin: 3.7 g/dL (ref 3.3–5.5)
Alkaline Phosphatase: 195 U/L — ABNORMAL HIGH (ref 26–84)
BUN: 13 mg/dL (ref 7–22)
CALCIUM: 9.8 mg/dL (ref 8.0–10.3)
CHLORIDE: 105 meq/L (ref 98–108)
CO2: 25 meq/L (ref 18–33)
Creat: 0.7 mg/dl (ref 0.6–1.2)
Glucose, Bld: 125 mg/dL — ABNORMAL HIGH (ref 73–118)
Potassium: 3.8 mEq/L (ref 3.3–4.7)
Sodium: 141 mEq/L (ref 128–145)
Total Bilirubin: 1.4 mg/dl (ref 0.20–1.60)
Total Protein: 7.1 g/dL (ref 6.4–8.1)

## 2015-05-16 MED ORDER — SODIUM CHLORIDE 0.9 % IV SOLN
INTRAVENOUS | Status: DC
  Administered 2015-05-16: 09:00:00 via INTRAVENOUS

## 2015-05-16 MED ORDER — PEGFILGRASTIM INJECTION 6 MG/0.6ML ~~LOC~~
6.0000 mg | PREFILLED_SYRINGE | Freq: Once | SUBCUTANEOUS | Status: AC
  Administered 2015-05-16: 6 mg via SUBCUTANEOUS

## 2015-05-16 MED ORDER — HEPARIN SOD (PORK) LOCK FLUSH 100 UNIT/ML IV SOLN
500.0000 [IU] | Freq: Once | INTRAVENOUS | Status: AC
  Administered 2015-05-16: 500 [IU] via INTRAVENOUS
  Filled 2015-05-16: qty 5

## 2015-05-16 MED ORDER — PEGFILGRASTIM INJECTION 6 MG/0.6ML ~~LOC~~
PREFILLED_SYRINGE | SUBCUTANEOUS | Status: AC
Start: 2015-05-16 — End: 2015-05-16
  Filled 2015-05-16: qty 0.6

## 2015-05-16 MED ORDER — SODIUM CHLORIDE 0.9 % IJ SOLN
10.0000 mL | INTRAMUSCULAR | Status: DC | PRN
  Administered 2015-05-16: 10 mL via INTRAVENOUS
  Filled 2015-05-16: qty 10

## 2015-05-16 NOTE — Patient Instructions (Addendum)
Dehydration, Adult Dehydration is when you lose more fluids from the body than you take in. Vital organs like the kidneys, brain, and heart cannot function without a proper amount of fluids and salt. Any loss of fluids from the body can cause dehydration.  CAUSES   Vomiting.  Diarrhea.  Excessive sweating.  Excessive urine output.  Fever. SYMPTOMS  Mild dehydration  Thirst.  Dry lips.  Slightly dry mouth. Moderate dehydration  Very dry mouth.  Sunken eyes.  Skin does not bounce back quickly when lightly pinched and released.  Dark urine and decreased urine production.  Decreased tear production.  Headache. Severe dehydration  Very dry mouth.  Extreme thirst.  Rapid, weak pulse (more than 100 beats per minute at rest).  Cold hands and feet.  Not able to sweat in spite of heat and temperature.  Rapid breathing.  Blue lips.  Confusion and lethargy.  Difficulty being awakened.  Minimal urine production.  No tears. DIAGNOSIS  Your caregiver will diagnose dehydration based on your symptoms and your exam. Blood and urine tests will help confirm the diagnosis. The diagnostic evaluation should also identify the cause of dehydration. TREATMENT  Treatment of mild or moderate dehydration can often be done at home by increasing the amount of fluids that you drink. It is best to drink small amounts of fluid more often. Drinking too much at one time can make vomiting worse. Refer to the home care instructions below. Severe dehydration needs to be treated at the hospital where you will probably be given intravenous (IV) fluids that contain water and electrolytes. HOME CARE INSTRUCTIONS   Ask your caregiver about specific rehydration instructions.  Drink enough fluids to keep your urine clear or pale yellow.  Drink small amounts frequently if you have nausea and vomiting.  Eat as you normally do.  Avoid:  Foods or drinks high in sugar.  Carbonated  drinks.  Juice.  Extremely hot or cold fluids.  Drinks with caffeine.  Fatty, greasy foods.  Alcohol.  Tobacco.  Overeating.  Gelatin desserts.  Wash your hands well to avoid spreading bacteria and viruses.  Only take over-the-counter or prescription medicines for pain, discomfort, or fever as directed by your caregiver.  Ask your caregiver if you should continue all prescribed and over-the-counter medicines.  Keep all follow-up appointments with your caregiver. SEEK MEDICAL CARE IF:  You have abdominal pain and it increases or stays in one area (localizes).  You have a rash, stiff neck, or severe headache.  You are irritable, sleepy, or difficult to awaken.  You are weak, dizzy, or extremely thirsty. SEEK IMMEDIATE MEDICAL CARE IF:   You are unable to keep fluids down or you get worse despite treatment.  You have frequent episodes of vomiting or diarrhea.  You have blood or green matter (bile) in your vomit.  You have blood in your stool or your stool looks black and tarry.  You have not urinated in 6 to 8 hours, or you have only urinated a small amount of very dark urine.  You have a fever.  You faint. MAKE SURE YOU:   Understand these instructions.  Will watch your condition.  Will get help right away if you are not doing well or get worse. Document Released: 12/14/2005 Document Revised: 03/07/2012 Document Reviewed: 08/03/2011 ExitCare Patient Information 2015 [employer]. This information is not intended to replace advice given to you by your health care provider. Make sure you discuss any questions you have with your health care   provider. Neutropenia Neutropenia is a condition that occurs when the level of a certain type of white blood cell (neutrophil) in your body becomes lower than normal. Neutrophils are made in the bone marrow and fight infections. These cells protect against bacteria and viruses. The fewer neutrophils you have, and the longer  your body remains without them, the greater your risk of getting a severe infection becomes. CAUSES  The cause of neutropenia may be hard to determine. However, it is usually due to 3 main problems:   Decreased production of neutrophils. This may be due to:  Certain medicines such as chemotherapy.  Genetic problems.  Cancer.  Radiation treatments.  Vitamin deficiency.  Some pesticides.  Increased destruction of neutrophils. This may be due to:  Overwhelming infections.  Hemolytic anemia. This is when the body destroys its own blood cells.  Chemotherapy.  Neutrophils moving to areas of the body where they cannot fight infections. This may be due to:  Dialysis procedures.  Conditions where the spleen becomes enlarged. Neutrophils are held in the spleen and are not available to the rest of the body.  Overwhelming infections. The neutrophils are held in the area of the infection and are not available to the rest of the body. SYMPTOMS  There are no specific symptoms of neutropenia. The lack of neutrophils can result in an infection, and an infection can cause various problems. DIAGNOSIS  Diagnosis is made by a blood test. A complete blood count is performed. The normal level of neutrophils in human blood differs with age and race. Infants have lower counts than older children and adults. African Americans have lower counts than Caucasians or Asians. The average adult level is 1500 cells/mm3 of blood. Neutrophil counts are interpreted as follows:  Greater than 1000 cells/mm3 gives normal protection against infection.  500 to 1000 cells/mm3 gives an increased risk for infection.  200 to 500 cells/mm3 is a greater risk for severe infection.  Lower than 200 cells/mm3 is a marked risk of infection. This may require hospitalization and treatment with antibiotic medicines. TREATMENT  Treatment depends on the underlying cause, severity, and presence of infections or symptoms. It also  depends on your health. Your caregiver will discuss the treatment plan with you. Mild cases are often easily treated and have a good outcome. Preventative measures may also be started to limit your risk of infections. Treatment can include:  Taking antibiotics.  Stopping medicines that are known to cause neutropenia.  Correcting nutritional deficiencies by eating green vegetables to supply folic acid and taking vitamin B supplements.  Stopping exposure to pesticides if your neutropenia is related to pesticide exposure.  Taking a blood growth factor called sargramostim, pegfilgrastim, or filgrastim if you are undergoing chemotherapy for cancer. This stimulates white blood cell production.  Removal of the spleen if you have Felty's syndrome and have repeated infections. HOME CARE INSTRUCTIONS   Follow your caregiver's instructions about when you need to have blood work done.  Wash your hands often. Make sure others who come in contact with you also wash their hands.  Wash raw fruits and vegetables before eating them. They can carry bacteria and fungi.  Avoid people with colds or spreadable (contagious) diseases (chickenpox, herpes zoster, influenza).  Avoid large crowds.  Avoid construction areas. The dust can release fungus into the air.  Be cautious around children in daycare or school environments.  Take care of your respiratory system by coughing and deep breathing.  Bathe daily.  Protect your skin from   cuts and burns.  Do not work in the garden or with flowers and plants.  Care for the mouth before and after meals by brushing with a soft toothbrush. If you have mucositis, do not use mouthwash. Mouthwash contains alcohol and can dry out the mouth even more.  Clean the area between the genitals and the anus (perineal area) after urination and bowel movements. Women need to wipe from front to back.  Use a water soluble lubricant during sexual intercourse and practice good  hygiene after. Do not have intercourse if you are severely neutropenic. Check with your caregiver for guidelines.  Exercise daily as tolerated.  Avoid people who were vaccinated with a live vaccine in the past 30 days. You should not receive live vaccines (polio, typhoid).  Do not provide direct care for pets. Avoid animal droppings. Do not clean litter boxes and bird cages.  Do not share food utensils.  Do not use tampons, enemas, or rectal suppositories unless directed by your caregiver.  Use an electric razor to remove hair.  Wash your hands after handling magazines, letters, and newspapers. SEEK IMMEDIATE MEDICAL CARE IF:   You have a fever.  You have chills or start to shake.  You feel nauseous or vomit.  You develop mouth sores.  You develop aches and pains.  You have redness and swelling around open wounds.  Your skin is warm to the touch.  You have pus coming from your wounds.  You develop swollen lymph nodes.  You feel weak or fatigued.  You develop red streaks on the skin. MAKE SURE YOU:  Understand these instructions.  Will watch your condition.  Will get help right away if you are not doing well or get worse. Document Released: 06/05/2002 Document Revised: 03/07/2012 Document Reviewed: 07/03/2011 ExitCare Patient Information 2015 [employer]. This information is not intended to replace advice given to you by your health care provider. Make sure you discuss any questions you have with your health care provider.  

## 2015-05-16 NOTE — Telephone Encounter (Signed)
Critical Value ANC 0.5 Laverna Peace NP notified. Treatment plan changed.

## 2015-05-20 ENCOUNTER — Other Ambulatory Visit: Payer: Medicaid Other

## 2015-05-21 ENCOUNTER — Encounter: Payer: Self-pay | Admitting: *Deleted

## 2015-05-22 ENCOUNTER — Ambulatory Visit: Payer: Medicaid Other | Admitting: Radiation Oncology

## 2015-05-23 ENCOUNTER — Ambulatory Visit: Payer: Medicaid Other | Admitting: Radiation Oncology

## 2015-05-28 ENCOUNTER — Telehealth: Payer: Self-pay | Admitting: *Deleted

## 2015-05-28 NOTE — Telephone Encounter (Signed)
Patient is having dental pain. She states she has several teeth which need to be pulled. She is currently on Zometa. Her last dose was 05/09/2015. Notified patient that ideally she needs to wait until August 12th for any sort of invasive dental work. She understood this. It was also communicated, that if her dental needs became more urgent, to have her dentist call the office to discuss having work done sooner. She also understood this. She will attempt to wait until August but will call office if things change.

## 2015-05-30 ENCOUNTER — Ambulatory Visit (HOSPITAL_BASED_OUTPATIENT_CLINIC_OR_DEPARTMENT_OTHER): Payer: Medicaid Other | Admitting: Hematology & Oncology

## 2015-05-30 ENCOUNTER — Ambulatory Visit (HOSPITAL_BASED_OUTPATIENT_CLINIC_OR_DEPARTMENT_OTHER): Payer: Medicaid Other

## 2015-05-30 ENCOUNTER — Other Ambulatory Visit: Payer: Self-pay | Admitting: *Deleted

## 2015-05-30 ENCOUNTER — Other Ambulatory Visit (HOSPITAL_BASED_OUTPATIENT_CLINIC_OR_DEPARTMENT_OTHER): Payer: Medicaid Other

## 2015-05-30 ENCOUNTER — Other Ambulatory Visit: Payer: Self-pay

## 2015-05-30 ENCOUNTER — Encounter: Payer: Self-pay | Admitting: Hematology & Oncology

## 2015-05-30 VITALS — BP 128/86 | HR 102 | Temp 98.5°F | Resp 16 | Ht 64.0 in | Wt 148.0 lb

## 2015-05-30 DIAGNOSIS — Z5112 Encounter for antineoplastic immunotherapy: Secondary | ICD-10-CM | POA: Diagnosis present

## 2015-05-30 DIAGNOSIS — C7931 Secondary malignant neoplasm of brain: Secondary | ICD-10-CM

## 2015-05-30 DIAGNOSIS — C50919 Malignant neoplasm of unspecified site of unspecified female breast: Secondary | ICD-10-CM

## 2015-05-30 DIAGNOSIS — C50911 Malignant neoplasm of unspecified site of right female breast: Secondary | ICD-10-CM

## 2015-05-30 DIAGNOSIS — Z170 Estrogen receptor positive status [ER+]: Secondary | ICD-10-CM

## 2015-05-30 DIAGNOSIS — C787 Secondary malignant neoplasm of liver and intrahepatic bile duct: Secondary | ICD-10-CM

## 2015-05-30 DIAGNOSIS — Z5111 Encounter for antineoplastic chemotherapy: Secondary | ICD-10-CM

## 2015-05-30 DIAGNOSIS — C50912 Malignant neoplasm of unspecified site of left female breast: Secondary | ICD-10-CM

## 2015-05-30 DIAGNOSIS — D509 Iron deficiency anemia, unspecified: Secondary | ICD-10-CM

## 2015-05-30 LAB — CMP (CANCER CENTER ONLY)
ALK PHOS: 233 U/L — AB (ref 26–84)
ALT(SGPT): 242 U/L (ref 10–47)
AST: 106 U/L — ABNORMAL HIGH (ref 11–38)
Albumin: 3.5 g/dL (ref 3.3–5.5)
BUN, Bld: 11 mg/dL (ref 7–22)
CHLORIDE: 106 meq/L (ref 98–108)
CO2: 26 mEq/L (ref 18–33)
Calcium: 8.9 mg/dL (ref 8.0–10.3)
Creat: 0.7 mg/dl (ref 0.6–1.2)
Glucose, Bld: 104 mg/dL (ref 73–118)
Potassium: 3.3 mEq/L (ref 3.3–4.7)
SODIUM: 134 meq/L (ref 128–145)
Total Bilirubin: 0.9 mg/dl (ref 0.20–1.60)
Total Protein: 6.3 g/dL — ABNORMAL LOW (ref 6.4–8.1)

## 2015-05-30 LAB — CBC WITH DIFFERENTIAL (CANCER CENTER ONLY)
BASO#: 0 10*3/uL (ref 0.0–0.2)
BASO%: 0.5 % (ref 0.0–2.0)
EOS%: 0.2 % (ref 0.0–7.0)
Eosinophils Absolute: 0 10*3/uL (ref 0.0–0.5)
HEMATOCRIT: 31 % — AB (ref 34.8–46.6)
HGB: 10.2 g/dL — ABNORMAL LOW (ref 11.6–15.9)
LYMPH#: 0.7 10*3/uL — ABNORMAL LOW (ref 0.9–3.3)
LYMPH%: 16.7 % (ref 14.0–48.0)
MCH: 35.5 pg — AB (ref 26.0–34.0)
MCHC: 32.9 g/dL (ref 32.0–36.0)
MCV: 108 fL — ABNORMAL HIGH (ref 81–101)
MONO#: 0.6 10*3/uL (ref 0.1–0.9)
MONO%: 13.8 % — AB (ref 0.0–13.0)
NEUT#: 2.9 10*3/uL (ref 1.5–6.5)
NEUT%: 68.8 % (ref 39.6–80.0)
Platelets: 141 10*3/uL — ABNORMAL LOW (ref 145–400)
RBC: 2.87 10*6/uL — AB (ref 3.70–5.32)
RDW: 19.7 % — AB (ref 11.1–15.7)
WBC: 4.1 10*3/uL (ref 3.9–10.0)

## 2015-05-30 MED ORDER — SODIUM CHLORIDE 0.9 % IV SOLN
Freq: Once | INTRAVENOUS | Status: AC
  Administered 2015-05-30: 13:00:00 via INTRAVENOUS

## 2015-05-30 MED ORDER — SODIUM CHLORIDE 0.9 % IJ SOLN
3.0000 mL | INTRAMUSCULAR | Status: DC | PRN
  Filled 2015-05-30: qty 10

## 2015-05-30 MED ORDER — OXYCODONE HCL 5 MG PO TABS: 5.0000 mg | ORAL_TABLET | Freq: Four times a day (QID) | ORAL | Status: DC | PRN

## 2015-05-30 MED ORDER — ALTEPLASE 2 MG IJ SOLR
2.0000 mg | Freq: Once | INTRAMUSCULAR | Status: DC | PRN
  Filled 2015-05-30: qty 2

## 2015-05-30 MED ORDER — ACETAMINOPHEN 325 MG PO TABS
650.0000 mg | ORAL_TABLET | Freq: Once | ORAL | Status: AC
  Administered 2015-05-30: 650 mg via ORAL

## 2015-05-30 MED ORDER — TRASTUZUMAB CHEMO INJECTION 440 MG
6.0000 mg/kg | Freq: Once | INTRAVENOUS | Status: AC
  Administered 2015-05-30: 399 mg via INTRAVENOUS
  Filled 2015-05-30: qty 19

## 2015-05-30 MED ORDER — SODIUM CHLORIDE 0.9 % IV SOLN
518.5000 mg | Freq: Once | INTRAVENOUS | Status: AC
  Administered 2015-05-30: 520 mg via INTRAVENOUS
  Filled 2015-05-30: qty 52

## 2015-05-30 MED ORDER — HEPARIN SOD (PORK) LOCK FLUSH 100 UNIT/ML IV SOLN
500.0000 [IU] | Freq: Once | INTRAVENOUS | Status: AC | PRN
  Administered 2015-05-30: 500 [IU]
  Filled 2015-05-30: qty 5

## 2015-05-30 MED ORDER — HEPARIN SOD (PORK) LOCK FLUSH 100 UNIT/ML IV SOLN
500.0000 [IU] | Freq: Once | INTRAVENOUS | Status: DC | PRN
  Filled 2015-05-30: qty 5

## 2015-05-30 MED ORDER — SODIUM CHLORIDE 0.9 % IV SOLN: Freq: Once | INTRAVENOUS | Status: DC

## 2015-05-30 MED ORDER — DIPHENHYDRAMINE HCL 25 MG PO CAPS
50.0000 mg | ORAL_CAPSULE | Freq: Once | ORAL | Status: AC
  Administered 2015-05-30: 50 mg via ORAL

## 2015-05-30 MED ORDER — SODIUM CHLORIDE 0.9 % IJ SOLN
10.0000 mL | INTRAMUSCULAR | Status: DC | PRN
  Administered 2015-05-30: 10 mL
  Filled 2015-05-30: qty 10

## 2015-05-30 MED ORDER — SODIUM CHLORIDE 0.9 % IV SOLN
420.0000 mg | Freq: Once | INTRAVENOUS | Status: AC
  Administered 2015-05-30: 420 mg via INTRAVENOUS
  Filled 2015-05-30: qty 14

## 2015-05-30 MED ORDER — SODIUM CHLORIDE 0.9 % IJ SOLN
10.0000 mL | INTRAMUSCULAR | Status: DC | PRN
  Filled 2015-05-30: qty 10

## 2015-05-30 MED ORDER — DIPHENHYDRAMINE HCL 25 MG PO CAPS
ORAL_CAPSULE | ORAL | Status: AC
  Filled 2015-05-30: qty 2

## 2015-05-30 MED ORDER — SODIUM CHLORIDE 0.9 % IV SOLN: 800.0000 mg/m2 | Freq: Once | INTRAVENOUS | Status: DC

## 2015-05-30 MED ORDER — ACETAMINOPHEN 325 MG PO TABS
ORAL_TABLET | ORAL | Status: AC
  Filled 2015-05-30: qty 2

## 2015-05-30 MED ORDER — HEPARIN SOD (PORK) LOCK FLUSH 100 UNIT/ML IV SOLN
250.0000 [IU] | Freq: Once | INTRAVENOUS | Status: DC | PRN
  Filled 2015-05-30: qty 5

## 2015-05-30 MED ORDER — SODIUM CHLORIDE 0.9 % IV SOLN
Freq: Once | INTRAVENOUS | Status: AC
  Administered 2015-05-30: 13:00:00 via INTRAVENOUS
  Filled 2015-05-30: qty 8

## 2015-05-30 NOTE — Patient Instructions (Signed)
Carboplatin injection What is this medicine? CARBOPLATIN (KAR boe pla tin) is a chemotherapy drug. It targets fast dividing cells, like cancer cells, and causes these cells to die. This medicine is used to treat ovarian cancer and many other cancers. This medicine may be used for other purposes; ask your health care provider or pharmacist if you have questions. COMMON BRAND NAME(S): Paraplatin What should I tell my health care provider before I take this medicine? They need to know if you have any of these conditions: -blood disorders -hearing problems -kidney disease -recent or ongoing radiation therapy -an unusual or allergic reaction to carboplatin, cisplatin, other chemotherapy, other medicines, foods, dyes, or preservatives -pregnant or trying to get pregnant -breast-feeding How should I use this medicine? This drug is usually given as an infusion into a vein. It is administered in a hospital or clinic by a specially trained health care professional. Talk to your pediatrician regarding the use of this medicine in children. Special care may be needed. Overdosage: If you think you have taken too much of this medicine contact a poison control center or emergency room at once. NOTE: This medicine is only for you. Do not share this medicine with others. What if I miss a dose? It is important not to miss a dose. Call your doctor or health care professional if you are unable to keep an appointment. What may interact with this medicine? -medicines for seizures -medicines to increase blood counts like filgrastim, pegfilgrastim, sargramostim -some antibiotics like amikacin, gentamicin, neomycin, streptomycin, tobramycin -vaccines Talk to your doctor or health care professional before taking any of these medicines: -acetaminophen -aspirin -ibuprofen -ketoprofen -naproxen This list may not describe all possible interactions. Give your health care provider a list of all the medicines, herbs,  non-prescription drugs, or dietary supplements you use. Also tell them if you smoke, drink alcohol, or use illegal drugs. Some items may interact with your medicine. What should I watch for while using this medicine? Your condition will be monitored carefully while you are receiving this medicine. You will need important blood work done while you are taking this medicine. This drug may make you feel generally unwell. This is not uncommon, as chemotherapy can affect healthy cells as well as cancer cells. Report any side effects. Continue your course of treatment even though you feel ill unless your doctor tells you to stop. In some cases, you may be given additional medicines to help with side effects. Follow all directions for their use. Call your doctor or health care professional for advice if you get a fever, chills or sore throat, or other symptoms of a cold or flu. Do not treat yourself. This drug decreases your body's ability to fight infections. Try to avoid being around people who are sick. This medicine may increase your risk to bruise or bleed. Call your doctor or health care professional if you notice any unusual bleeding. Be careful brushing and flossing your teeth or using a toothpick because you may get an infection or bleed more easily. If you have any dental work done, tell your dentist you are receiving this medicine. Avoid taking products that contain aspirin, acetaminophen, ibuprofen, naproxen, or ketoprofen unless instructed by your doctor. These medicines may hide a fever. Do not become pregnant while taking this medicine. Women should inform their doctor if they wish to become pregnant or think they might be pregnant. There is a potential for serious side effects to an unborn child. Talk to your health care professional or  pharmacist for more information. Do not breast-feed an infant while taking this medicine. What side effects may I notice from receiving this medicine? Side effects  that you should report to your doctor or health care professional as soon as possible: -allergic reactions like skin rash, itching or hives, swelling of the face, lips, or tongue -signs of infection - fever or chills, cough, sore throat, pain or difficulty passing urine -signs of decreased platelets or bleeding - bruising, pinpoint red spots on the skin, black, tarry stools, nosebleeds -signs of decreased red blood cells - unusually weak or tired, fainting spells, lightheadedness -breathing problems -changes in hearing -changes in vision -chest pain -high blood pressure -low blood counts - This drug may decrease the number of white blood cells, red blood cells and platelets. You may be at increased risk for infections and bleeding. -nausea and vomiting -pain, swelling, redness or irritation at the injection site -pain, tingling, numbness in the hands or feet -problems with balance, talking, walking -trouble passing urine or change in the amount of urine Side effects that usually do not require medical attention (report to your doctor or health care professional if they continue or are bothersome): -hair loss -loss of appetite -metallic taste in the mouth or changes in taste This list may not describe all possible side effects. Call your doctor for medical advice about side effects. You may report side effects to FDA at 1-800-FDA-1088. Where should I keep my medicine? This drug is given in a hospital or clinic and will not be stored at home. NOTE: This sheet is a summary. It may not cover all possible information. If you have questions about this medicine, talk to your doctor, pharmacist, or health care provider.  2015, [employer]. (2008-03-20 14:38:05) Pertuzumab injection What is this medicine? PERTUZUMAB (per TOOZ ue mab) is a monoclonal antibody that targets a protein called HER2. HER2 is found in some breast cancers. This medicine can stop cancer cell growth. This medicine  is used with other cancer treatments. This medicine may be used for other purposes; ask your health care provider or pharmacist if you have questions. COMMON BRAND NAME(S): PERJETA What should I tell my health care provider before I take this medicine? They need to know if you have any of these conditions: -heart disease -heart failure -high blood pressure -history of irregular heart beat -recent or ongoing radiation therapy -an unusual or allergic reaction to pertuzumab, other medicines, foods, dyes, or preservatives -pregnant or trying to get pregnant -breast-feeding How should I use this medicine? This medicine is for infusion into a vein. It is given by a health care professional in a hospital or clinic setting. Talk to your pediatrician regarding the use of this medicine in children. Special care may be needed. Overdosage: If you think you've taken too much of this medicine contact a poison control center or emergency room at once. Overdosage: If you think you have taken too much of this medicine contact a poison control center or emergency room at once. NOTE: This medicine is only for you. Do not share this medicine with others. What if I miss a dose? It is important not to miss your dose. Call your doctor or health care professional if you are unable to keep an appointment. What may interact with this medicine? Interactions are not expected. Give your health care provider a list of all the medicines, herbs, non-prescription drugs, or dietary supplements you use. Also tell them if you smoke, drink alcohol, or use illegal  drugs. Some items may interact with your medicine. This list may not describe all possible interactions. Give your health care provider a list of all the medicines, herbs, non-prescription drugs, or dietary supplements you use. Also tell them if you smoke, drink alcohol, or use illegal drugs. Some items may interact with your medicine. What should I watch for while  using this medicine? Your condition will be monitored carefully while you are receiving this medicine. Report any side effects. Continue your course of treatment even though you feel ill unless your doctor tells you to stop. Do not become pregnant while taking this medicine. Women should inform their doctor if they wish to become pregnant or think they might be pregnant. There is a potential for serious side effects to an unborn child. Talk to your health care professional or pharmacist for more information. Do not breast-feed an infant while taking this medicine. Call your doctor or health care professional for advice if you get a fever, chills or sore throat, or other symptoms of a cold or flu. Do not treat yourself. Try to avoid being around people who are sick. You may experience fever, chills, and headache during the infusion. Report any side effects during the infusion to your health care professional. What side effects may I notice from receiving this medicine? Side effects that you should report to your doctor or health care professional as soon as possible: -breathing problems -chest pain or palpitations -dizziness -feeling faint or lightheaded -fever or chills -skin rash, itching or hives -sore throat -swelling of the face, lips, or tongue -swelling of the legs or ankles -unusually weak or tired Side effects that usually do not require medical attention (Report these to your doctor or health care professional if they continue or are bothersome.): -diarrhea -hair loss -nausea, vomiting -tiredness This list may not describe all possible side effects. Call your doctor for medical advice about side effects. You may report side effects to FDA at 1-800-FDA-1088. Where should I keep my medicine? This drug is given in a hospital or clinic and will not be stored at home. NOTE: This sheet is a summary. It may not cover all possible information. If you have questions about this medicine, talk  to your doctor, pharmacist, or health care provider.  2015, [employer] Standard. (2012-10-12 16:54:15) Trastuzumab injection for infusion What is this medicine? TRASTUZUMAB (tras TOO zoo mab) is a monoclonal antibody. It targets a protein called HER2. This protein is found in some stomach and breast cancers. This medicine can stop cancer cell growth. This medicine may be used with other cancer treatments. This medicine may be used for other purposes; ask your health care provider or pharmacist if you have questions. COMMON BRAND NAME(S): Herceptin What should I tell my health care provider before I take this medicine? They need to know if you have any of these conditions: -heart disease -heart failure -infection (especially a virus infection such as chickenpox, cold sores, or herpes) -lung or breathing disease, like asthma -recent or ongoing radiation therapy -an unusual or allergic reaction to trastuzumab, benzyl alcohol, or other medications, foods, dyes, or preservatives -pregnant or trying to get pregnant -breast-feeding How should I use this medicine? This drug is given as an infusion into a vein. It is administered in a hospital or clinic by a specially trained health care professional. Talk to your pediatrician regarding the use of this medicine in children. This medicine is not approved for use in children. Overdosage: If you think you have taken  too much of this medicine contact a poison control center or emergency room at once. NOTE: This medicine is only for you. Do not share this medicine with others. What if I miss a dose? It is important not to miss a dose. Call your doctor or health care professional if you are unable to keep an appointment. What may interact with this medicine? -cyclophosphamide -doxorubicin -warfarin This list may not describe all possible interactions. Give your health care provider a list of all the medicines, herbs, non-prescription drugs, or  dietary supplements you use. Also tell them if you smoke, drink alcohol, or use illegal drugs. Some items may interact with your medicine. What should I watch for while using this medicine? Visit your doctor for checks on your progress. Report any side effects. Continue your course of treatment even though you feel ill unless your doctor tells you to stop. Call your doctor or health care professional for advice if you get a fever, chills or sore throat, or other symptoms of a cold or flu. Do not treat yourself. Try to avoid being around people who are sick. You may experience fever, chills and shaking during your first infusion. These effects are usually mild and can be treated with other medicines. Report any side effects during the infusion to your health care professional. Fever and chills usually do not happen with later infusions. What side effects may I notice from receiving this medicine? Side effects that you should report to your doctor or other health care professional as soon as possible: -breathing difficulties -chest pain or palpitations -cough -dizziness or fainting -fever or chills, sore throat -skin rash, itching or hives -swelling of the legs or ankles -unusually weak or tired Side effects that usually do not require medical attention (report to your doctor or other health care professional if they continue or are bothersome): -loss of appetite -headache -muscle aches -nausea This list may not describe all possible side effects. Call your doctor for medical advice about side effects. You may report side effects to FDA at 1-800-FDA-1088. Where should I keep my medicine? This drug is given in a hospital or clinic and will not be stored at home. NOTE: This sheet is a summary. It may not cover all possible information. If you have questions about this medicine, talk to your doctor, pharmacist, or health care provider.  2015, [employer] Standard. (2009-10-18 13:43:15)

## 2015-05-30 NOTE — Progress Notes (Signed)
Gemzar held d/t elevated LFT's per Dr Ginette Pitman. dph

## 2015-05-30 NOTE — Progress Notes (Signed)
Hematology and Oncology Follow Up Visit  Mary [name] [id number] 31-Dec-1955 59 y.o. 05/30/2015   Principle Diagnosis:   Metastatic breast cancer-triple positive  Current Therapy:   Status post 4 cycles of carboplatin/Gemzar Burley Saver /Herceptin  Zometa 4 mg IV every month- will hold this for dental work     Interim History:  [name] is back for follow-up.  She looks pretty good. She is due for a brain scan tomorrow.  She is complaining of some headaches. There's no visual issues.  She did have a CT scan done a few weeks ago. The CT scan showed that she had a nice response. She had decreasing her hepatic disease. Should decrease and peritoneal disease. There was a couple new omental nodules which were quite small.  Her last CA 27.29 in mid May was 55. This continues to improve.  She's had no problems with cough or shortness of breath. She's had no issues with bowel or bladder habits. 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 She tolerated this well. She completed this back in late March.    Overall, her performance status is ECOG 1.  Medications:  Current outpatient prescriptions:  .  acidophilus (RISAQUAD) CAPS capsule, Take 1 capsule by mouth daily., Disp: , Rfl:  .  amLODipine (NORVASC) 5 MG tablet, Take 1 tablet (5 mg total) by mouth every morning., Disp: 30 tablet, Rfl: 4 .  Cyanocobalamin (VITAMIN B-12 PO), Take by mouth., Disp: , Rfl:  .  dexamethasone (DECADRON) 4 MG tablet, Take 2 tablets (8 mg total) by mouth 2 (two) times daily., Disp: 180 tablet, Rfl: 4 .  Diphenhydramine-APAP, sleep, (HEADACHE RELIEF PM) 38-500 MG TABS, Take 2 tablets by mouth daily as needed (For headaches.). , Disp: , Rfl:  .  dronabinol (MARINOL) 2.5 MG capsule, Take 1 capsule (2.5 mg total) by mouth 2 (two) times daily before a meal., Disp: 60 capsule, Rfl: 3 .  emollient (BIAFINE) cream, Apply topically as needed., Disp: , Rfl:  .  escitalopram (LEXAPRO) 20 MG tablet, Take 1  tablet (20 mg total) by mouth every morning., Disp: 30 tablet, Rfl: 4 .  lisinopril (PRINIVIL,ZESTRIL) 40 MG tablet, Take 1 tablet (40 mg total) by mouth every morning., Disp: 30 tablet, Rfl: 4 .  LORazepam (ATIVAN) 0.5 MG tablet, Take 1 tablet (0.5 mg total) by mouth every 6 (six) hours as needed (Nausea or vomiting)., Disp: 30 tablet, Rfl: 0 .  Multiple Vitamin (MULTIVITAMIN WITH MINERALS) TABS tablet, Take 1 tablet by mouth daily., Disp: , Rfl:  .  ondansetron (ZOFRAN) 8 MG tablet, Take 1 tablet (8 mg total) by mouth 2 (two) times daily. Start the day after chemo for 3 days. Then take as needed for nausea or vomiting., Disp: 30 tablet, Rfl: 1 .  potassium chloride SA (K-DUR,KLOR-CON) 20 MEQ tablet, Take 2 tablets (40 mEq total) by mouth daily. Take 2 tablets (40 meq ) by mouth daily x 5 days, then take 20 meq daily., Disp: 40 tablet, Rfl: 2 .  pyridOXINE (VITAMIN B-6) 100 MG tablet, Take 2 tablets (200 mg total) by mouth daily., Disp: 60 tablet, Rfl: 11 .  SUMAtriptan (IMITREX) 100 MG tablet, Take 1 tablet (100 mg total) by mouth every 2 (two) hours as needed for migraine., Disp: 10 tablet, Rfl: 3 .  fluconazole (DIFLUCAN) 100 MG tablet, Take 1 tablet (100 mg total) by mouth daily. (Patient not taking: Reported on 05/30/2015), Disp: 30 tablet, Rfl: 4 .  oxyCODONE (OXY IR/ROXICODONE) 5 MG  immediate release tablet, Take 1-3 tablets (5-15 mg total) by mouth every 6 (six) hours as needed for severe pain. Take 1-2 if needed for pain due to cancer., Disp: 240 tablet, Rfl: 0 .  pantoprazole (PROTONIX) 40 MG tablet, Take 1 tablet (40 mg total) by mouth 2 (two) times daily., Disp: 60 tablet, Rfl: 4 No current facility-administered medications for this visit.  Facility-Administered Medications Ordered in Other Visits:  .  0.9 %  sodium chloride infusion, , Intravenous, Once, Volanda Napoleon, MD .  0.9 %  sodium chloride infusion, , Intravenous, Once, Volanda Napoleon, MD .  alteplase (CATHFLO ACTIVASE)  injection 2 mg, 2 mg, Intracatheter, Once PRN, Volanda Napoleon, MD .  alteplase (CATHFLO ACTIVASE) injection 2 mg, 2 mg, Intracatheter, Once PRN, Volanda Napoleon, MD .  CARBOplatin (PARAPLATIN) 520 mg in sodium chloride 0.9 % 250 mL chemo infusion, 520 mg, Intravenous, Once, Volanda Napoleon, MD .  Gemcitabine HCl (GEMZAR) 1,368 mg in sodium chloride 0.9 % 100 mL chemo infusion, 800 mg/m2 (Treatment Plan Actual), Intravenous, Once, Volanda Napoleon, MD .  heparin lock flush 100 unit/mL, 500 Units, Intracatheter, Once PRN, Volanda Napoleon, MD .  heparin lock flush 100 unit/mL, 250 Units, Intracatheter, Once PRN, Volanda Napoleon, MD .  heparin lock flush 100 unit/mL, 500 Units, Intracatheter, Once PRN, Volanda Napoleon, MD .  heparin lock flush 100 unit/mL, 250 Units, Intracatheter, Once PRN, Volanda Napoleon, MD .  pertuzumab (PERJETA) 420 mg in sodium chloride 0.9 % 250 mL chemo infusion, 420 mg, Intravenous, Once, Volanda Napoleon, MD .  sodium chloride 0.9 % injection 10 mL, 10 mL, Intracatheter, PRN, Volanda Napoleon, MD .  sodium chloride 0.9 % injection 10 mL, 10 mL, Intracatheter, PRN, Volanda Napoleon, MD .  sodium chloride 0.9 % injection 3 mL, 3 mL, Intravenous, PRN, Volanda Napoleon, MD .  sodium chloride 0.9 % injection 3 mL, 3 mL, Intravenous, PRN, Volanda Napoleon, MD .  trastuzumab (HERCEPTIN) 399 mg in sodium chloride 0.9 % 250 mL chemo infusion, 6 mg/kg (Treatment Plan Actual), Intravenous, Once, Volanda Napoleon, MD, Last Rate: 538 mL/hr at 05/30/15 1315, 399 mg at 05/30/15 1315  Allergies:  Allergies  Allergen Reactions  . Hydrocodone     "makes me crawl on the floor"  . Aspirin Other (See Comments)    Due to ulcers  . Penicillins Swelling    Past Medical History, Surgical history, Social history, and Family History were reviewed and updated.  Review of Systems: As above  Physical Exam:  height is 5\' 4"  (1.626 m) and weight is 148 lb (67.132 kg). Her oral temperature is 98.5 F  (36.9 C). Her blood pressure is 128/86 and her pulse is 102. Her respiration is 16.   Fairly well-built and well-nourished African-American female. She has no ocular or oral lesions. There is no palpable cervical or supraclavicular lymph nodes. Lungs are clear. Cardiac exam regular rate and rhythm with no murmurs, rubs or bruits. Breast exam shows some moderate swelling of the right breast. She still has some firmness about the areola. She does have some palpable right axillary lymph nodes. She has a dressing over her wound site in the right axilla. Left breast is unremarkable remarkable. Abdomen is soft. She has good bowel sounds. There is no fluid wave. There is no palpable liver or spleen tip. Extremity shows no clubbing, cyanosis or edema. Neurological exam shows no focal neurological deficits. Skin exam  shows no rashes, ecchymoses or petechia.  Lab Results  Component Value Date   WBC 4.1 05/30/2015   HGB 10.2* 05/30/2015   HCT 31.0* 05/30/2015   MCV 108* 05/30/2015   PLT 141* 05/30/2015     Chemistry      Component Value Date/Time   NA 134 05/30/2015 1306   NA 137 04/09/2014 0450   K 3.3 05/30/2015 1306   K 3.5* 04/09/2014 0450   CL 106 05/30/2015 1306   CL 101 04/09/2014 0450   CO2 26 05/30/2015 1306   CO2 23 04/09/2014 0450   BUN 11 05/30/2015 1306   BUN 5* 04/09/2014 0450   CREATININE 0.7 05/30/2015 1306   CREATININE 0.59 04/09/2014 0450      Component Value Date/Time   CALCIUM 8.9 05/30/2015 1306   CALCIUM 9.3 04/09/2014 0450   ALKPHOS 233* 05/30/2015 1306   ALKPHOS 179* 04/07/2014 0630   AST 106* 05/30/2015 1306   AST 74* 04/07/2014 0630   ALT 242* 05/30/2015 1306   ALT 34 04/07/2014 0630   BILITOT 0.90 05/30/2015 1306   BILITOT 0.7 04/07/2014 0630         Impression and Plan: Mary [name] is 59 year old African American female. She is postmenopausal. She has a triple positive metastatic breast cancer.   Overall, she clinically looks better.   We will have  to see what the MRI the brain shows.  She does get elevated liver function studies with gemcitabine. I'll have to see what else might cause the liver function studies to be elevated.  We will plan to get her back in about 3 weeks.  Volanda Napoleon, MD 6/2/20161:42 PM

## 2015-05-31 ENCOUNTER — Ambulatory Visit
Admission: RE | Admit: 2015-05-31 | Discharge: 2015-05-31 | Disposition: A | Discharge status: Home / Self Care | Payer: Medicaid Other | Source: Ambulatory Visit | Attending: Radiation Oncology | Admitting: Radiation Oncology

## 2015-05-31 ENCOUNTER — Other Ambulatory Visit: Payer: Medicaid Other

## 2015-05-31 DIAGNOSIS — C7931 Secondary malignant neoplasm of brain: Secondary | ICD-10-CM

## 2015-05-31 LAB — CANCER ANTIGEN 27.29: CA 27.29: 63 U/mL — ABNORMAL HIGH (ref 0–39)

## 2015-05-31 LAB — LACTATE DEHYDROGENASE: LDH: 228 U/L (ref 94–250)

## 2015-05-31 MED ORDER — GADOBENATE DIMEGLUMINE 529 MG/ML IV SOLN: 13.0000 mL | Freq: Once | INTRAVENOUS | Status: AC | PRN

## 2015-06-03 ENCOUNTER — Encounter: Payer: Self-pay | Admitting: Radiation Oncology

## 2015-06-03 ENCOUNTER — Ambulatory Visit
Admission: RE | Admit: 2015-06-03 | Discharge: 2015-06-03 | Disposition: A | Discharge status: Home / Self Care | Payer: Medicaid Other | Source: Ambulatory Visit | Attending: Radiation Oncology | Admitting: Radiation Oncology

## 2015-06-03 ENCOUNTER — Ambulatory Visit: Payer: Self-pay | Admitting: Radiation Oncology

## 2015-06-03 VITALS — BP 118/85 | HR 101 | Temp 98.4°F | Resp 20 | Ht 64.0 in | Wt 145.4 lb

## 2015-06-03 DIAGNOSIS — C7931 Secondary malignant neoplasm of brain: Secondary | ICD-10-CM

## 2015-06-03 NOTE — Progress Notes (Addendum)
Follow up brain mets, had MRI 05/31/15 here for results, c/o headache back of head and radiates to front left part of forehead,  Took excedrin this am, OTCc/o dizzyness when she firstgets up and later in the day, appetite fair, no nausea, last Chemo done last Thursday, roomk air 100 %, energy level fair also on Decadron 4mg  bid, no thrush BP 118/85 mmHg  Pulse 101  Temp(Src) 98.4 F (36.9 C) (Oral)  Resp 20  Ht 5\' 4"  (1.626 m)  Wt 145 lb 6.4 oz (65.953 kg)  BMI 24.95 kg/m2  SpO2 100%  Wt Readings from Last 3 Encounters:  06/03/15 145 lb 6.4 oz (65.953 kg)  05/31/15 148 lb (67.132 kg)  05/30/15 148 lb (67.132 kg)

## 2015-06-03 NOTE — Progress Notes (Signed)
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 over 75 brain metastases, several showing hemorrhage 198.3 C79.31    Interval Since Last Radiation:  3 months  Narrative:  The patient returns today for routine follow-up. She had MRI 05/31/15 here for results, she c/o headache back of head and radiates to front left part of forehead, Took excedrin this am, OTCc/o dizzyness when she first gets up and later in the day, appetite fair, no nausea, last Chemo done last Thursday, roomk air 100 %, energy level fair also on Decadron 4mg  bid, no thrush  ALLERGIES:  is allergic to hydrocodone; aspirin; and penicillins.  Meds: Current Outpatient Prescriptions  Medication Sig Dispense Refill  . acidophilus (RISAQUAD) CAPS capsule Take 1 capsule by mouth daily.    Marland Kitchen amLODipine (NORVASC) 5 MG tablet Take 1 tablet (5 mg total) by mouth every morning. 30 tablet 4  . aspirin-acetaminophen-caffeine (EXCEDRIN MIGRAINE) [id number] MG per tablet Take by mouth every 6 (six) hours as needed for headache.    . Cyanocobalamin (VITAMIN B-12 PO) Take by mouth.    . dexamethasone (DECADRON) 4 MG tablet Take 2 tablets (8 mg total) by mouth 2 (two) times daily. 180 tablet 4  . Diphenhydramine-APAP, sleep, (HEADACHE RELIEF PM) 38-500 MG TABS Take 2 tablets by mouth daily as needed (For headaches.).     Marland Kitchen dronabinol (MARINOL) 2.5 MG capsule Take 1 capsule (2.5 mg total) by mouth 2 (two) times daily before a meal. 60 capsule 3  . escitalopram (LEXAPRO) 20 MG tablet Take 1 tablet (20 mg total) by mouth every morning. 30 tablet 4  . lisinopril  (PRINIVIL,ZESTRIL) 40 MG tablet Take 1 tablet (40 mg total) by mouth every morning. 30 tablet 4  . LORazepam (ATIVAN) 0.5 MG tablet Take 1 tablet (0.5 mg total) by mouth every 6 (six) hours as needed (Nausea or vomiting). 30 tablet 0  . Multiple Vitamin (MULTIVITAMIN WITH MINERALS) TABS tablet Take 1 tablet by mouth daily.    . ondansetron (ZOFRAN) 8 MG tablet Take 1 tablet (8 mg total) by mouth 2 (two) times daily. Start the day after chemo for 3 days. Then take as needed for nausea or vomiting. 30 tablet 1  . oxyCODONE (OXY IR/ROXICODONE) 5 MG immediate release tablet Take 1-3 tablets (5-15 mg total) by mouth every 6 (six) hours as needed for severe pain. Take 1-2 if needed for pain due to cancer. 240 tablet 0  . pantoprazole (PROTONIX) 40 MG tablet Take 1 tablet (40 mg total) by mouth 2 (two) times daily. 60 tablet 4  . potassium chloride SA (K-DUR,KLOR-CON) 20 MEQ tablet Take 2 tablets (40 mEq total) by mouth daily. Take 2 tablets (40 meq ) by mouth daily x 5 days, then take 20 meq daily. 40 tablet 2  . pyridOXINE (VITAMIN B-6) 100 MG tablet Take 2 tablets (200 mg total) by mouth daily. 60 tablet 11  . SUMAtriptan (IMITREX) 100 MG tablet Take 1 tablet (100 mg total) by mouth every 2 (two) hours as needed for migraine. 10 tablet 3  .  emollient (BIAFINE) cream Apply topically as needed.    . fluconazole (DIFLUCAN) 100 MG tablet Take 1 tablet (100 mg total) by mouth daily. (Patient not taking: Reported on 05/30/2015) 30 tablet 4   No current facility-administered medications for this encounter.    Physical Findings: The patient is in no acute distress. Patient is alert and oriented.  height is 5\' 4"  (1.626 m) and weight is 145 lb 6.4 oz (65.953 kg). Her oral temperature is 98.4 F (36.9 C). Her blood pressure is 118/85 and her pulse is 101. Her respiration is 20 and oxygen saturation is 100%.   No significant changes.  Lab Findings: Lab Results  Component Value Date   WBC 4.1 05/30/2015   WBC  6.7 04/20/2014   HGB 10.2* 05/30/2015   HGB 9.6* 04/20/2014   HCT 31.0* 05/30/2015   HCT 29.1* 04/20/2014   PLT 141* 05/30/2015   PLT 451* 04/20/2014    Lab Results  Component Value Date   NA 134 05/30/2015   NA 137 04/09/2014   K 3.3 05/30/2015   K 3.5* 04/09/2014   CO2 26 05/30/2015   CO2 23 04/09/2014   GLUCOSE 104 05/30/2015   GLUCOSE 98 04/09/2014   BUN 11 05/30/2015   BUN 5* 04/09/2014   CREATININE 0.7 05/30/2015   CREATININE 0.59 04/09/2014   BILITOT 0.90 05/30/2015   BILITOT 0.7 04/07/2014   ALKPHOS 233* 05/30/2015   ALKPHOS 179* 04/07/2014   AST 106* 05/30/2015   AST 74* 04/07/2014   ALT 242* 05/30/2015   ALT 34 04/07/2014   PROT 6.3* 05/30/2015   PROT 6.7 04/07/2014   ALBUMIN 2.4* 04/07/2014   CALCIUM 8.9 05/30/2015   CALCIUM 9.3 04/09/2014    Radiographic Findings: Ct Chest W Contrast  05/13/2015   CLINICAL DATA:  Restaging metastatic right breast cancer, liver metastases. Evaluate response ongoing chemotherapy.  EXAM: CT CHEST, ABDOMEN, AND PELVIS WITH CONTRAST  TECHNIQUE: Multidetector CT imaging of the chest, abdomen and pelvis was performed following the standard protocol during bolus administration of intravenous contrast.  CONTRAST:  160mL OMNIPAQUE IOHEXOL 300 MG/ML  SOLN  COMPARISON:  02/18/2015.  FINDINGS: CT CHEST FINDINGS  Mediastinum/Nodes: Low-attenuation lesion in the left lobe of the thyroid measures 7 mm, as before. There is asymmetric enlargement of the inferior pole of the left thyroid, as before. Left IJ Port-A-Cath terminates at the SVC RA junction. Mediastinal lymph nodes are not enlarged by CT size criteria. Index low right paratracheal lymph node measures 8 mm (previously 11 mm). No hilar adenopathy. Right axillary lymph nodes measure up to 11 mm (previously 14 mm). Masslike thickening along the lateral right breast skin surface measures 1.1 x 3.2 cm (series 2, image 24), previously 1.8 x 3.6 cm. There is right breast skin thickening. No  internal mammary or left axillary adenopathy. Heart size normal. No pericardial effusion.  Lungs/Pleura: Small bilateral pleural effusions, stable or minimally decreased in size. Scattered parenchymal and subpleural pulmonary nodules are again seen, measuring up to 9 x 10 mm in the lingula (series 5, image 32), stable. No new lesions. Airway is unremarkable.  Musculoskeletal: Bilateral cervical ribs. Sclerotic metastases in the posterolateral aspect of the left eighth rib as well as T2 and T10 vertebral bodies are again noted. Healed left eleventh posterior rib fracture.  CT ABDOMEN AND PELVIS FINDINGS  Hepatobiliary: Ill-defined low-attenuation lesions in the liver are again seen, some of which have decreased slightly in size in the interval. Index lesion in the dome of the liver  measures 10 mm (image 38), previously 15 mm. Index left hepatic lobe lesion measures approximately 2.3 x 3.2 cm (series 2, image 46), previously 2.2 x 3.8 cm. Slight irregularity liver margin is indicative of post treatment fibrosis. Gallbladder is unremarkable. No biliary ductal dilatation.  Pancreas: Negative.  Spleen: Negative.  Adrenals/Urinary Tract: Right adrenal gland is unremarkable. Thickening of the medial limb left adrenal gland is again noted. 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 4 mm low-attenuation lesion in the lower pole left kidney is too small to characterize but statistically, likely represents a cyst. Kidneys are otherwise unremarkable. Ureters are decompressed. Bladder is grossly unremarkable.  Stomach/Bowel: Stomach, small bowel, appendix and colon are unremarkable.  Vascular/Lymphatic: Atherosclerotic calcification of the arterial vasculature without abdominal aortic aneurysm. Left renal vein appears circumaortic. No pathologically enlarged lymph nodes.  Reproductive: Patient is reportedly status post partial hysterectomy. Ovaries are visualized.  Other:  A new omental nodule is seen in the right upper quadrant, just anterior to the gallbladder, measuring 12 mm (series 2, image 61). A midline omental nodule measures 13 mm (image 58), also new. Peritoneal nodules adjacent to the ascending colon have improved however, measuring up to 7 mm (image 16), previously 10 mm. Previously seen right para midline omental nodule (series 2, image 67 on the prior exam) is no longer visualized. Somewhat amorphous fluid density structure in the portacaval station (series 2, image 53) is unchanged and may represent a lymphangioma. No free fluid.  Musculoskeletal: Mixed lytic and sclerotic lesions in the spine and pelvis are grossly unchanged. Lytic lesion in the right femoral neck is also stable and without evidence of pathologic fracture.  IMPRESSION: 1. New omental nodules. Other previously seen omental nodules have resolved or decreased in size. 2. Slight decrease in size of a cutaneous lesion along the lateral right breast as well as within right axillary and mediastinal lymph nodes. 3. Hepatic metastatic disease appears minimally improved. 4. Stable pulmonary and osseous metastatic disease. 5. Small to moderate bilateral pleural effusions, stable to minimally improved. 6. Asymmetric enlargement of the lower pole left thyroid.   Electronically Signed   By: Lorin Picket M.D.   On: 05/13/2015 09:39   Mr Jeri Cos FO Contrast  05/31/2015   CLINICAL DATA:  S RS 3 month restaging. Right lateral breast cancer diagnosed 04/12/2014. Multiple intracranial hemorrhagic metastases.  EXAM: MRI HEAD WITHOUT AND WITH CONTRAST  TECHNIQUE: Multiplanar, multiecho pulse sequences of the brain and surrounding structures were obtained without and with intravenous contrast.  CONTRAST:  13 cc MultiHance  COMPARISON:  03/01/2015  FINDINGS: There has been a remarkably positive response to therapy. Innumerable metastatic lesions demonstrated on the previous study scattered throughout the cerebellum,  brainstem and cerebral hemispheres have all responded, either becoming completely in apparent or being markedly reduced in size. No new or progressive lesions are seen. Residual hemosiderin remains present at this site of many of the previously seen hemorrhagic metastases. A few areas of methemoglobin are present in some of the lesions.  Comparing to the previous study, the largest cystic lesion in the right frontal operculum were region which measured 22 x 23 mm in diameter now measures 11 mm in diameter. A superficial metastasis in the right frontal region, possibly with dural involvement, previously measured 10 x 16 mm in diameter and today measures 9 by 6 mm in diameter. A previously seen cystic metastasis at the left posterior frontal vertex previously measured 18 x 18 mm  in diameter and now measures 7 x 5 mm in diameter.  As noted above, there are no new or progressive lesions.  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 Small developmental venous anomaly of the right cerebral peduncle is unchanged.  No hydrocephalus or extra-axial collection. No skull or skullbase lesion. Mild mucosal thickening of the left maxillary sinus is noted.  IMPRESSION: Remarkable positive response to therapy since the initial study. All lesions are smaller or invisible. See above for measurements of 3 index lesions in the right frontal operculum were region, superficial right frontal region, and left posterior frontal vertex.   Electronically Signed   By: Nelson Chimes M.D.   On: 05/31/2015 11:10   Ct Abdomen Pelvis W Contrast  05/13/2015   CLINICAL DATA:  Restaging metastatic right breast cancer, liver metastases. Evaluate response ongoing chemotherapy.  EXAM: CT CHEST, ABDOMEN, AND PELVIS WITH CONTRAST  TECHNIQUE: Multidetector CT imaging of the chest, abdomen and pelvis was performed following the standard protocol during bolus administration of intravenous contrast.  CONTRAST:  157mL OMNIPAQUE IOHEXOL 300  MG/ML  SOLN  COMPARISON:  02/18/2015.  FINDINGS: CT CHEST FINDINGS  Mediastinum/Nodes: Low-attenuation lesion in the left lobe of the thyroid measures 7 mm, as before. There is asymmetric enlargement of the inferior pole of the left thyroid, as before. Left IJ Port-A-Cath terminates at the SVC RA junction. Mediastinal lymph nodes are not enlarged by CT size criteria. Index low right paratracheal lymph node measures 8 mm (previously 11 mm). No hilar adenopathy. Right axillary lymph nodes measure up to 11 mm (previously 14 mm). Masslike thickening along the lateral right breast skin surface measures 1.1 x 3.2 cm (series 2, image 24), previously 1.8 x 3.6 cm. There is right breast skin thickening. No internal mammary or left axillary adenopathy. Heart size normal. No pericardial effusion.  Lungs/Pleura: Small bilateral pleural effusions, stable or minimally decreased in size. Scattered parenchymal and subpleural pulmonary nodules are again seen, measuring up to 9 x 10 mm in the lingula (series 5, image 32), stable. No new lesions. Airway is unremarkable.  Musculoskeletal: Bilateral cervical ribs. Sclerotic metastases in the posterolateral aspect of the left eighth rib as well as T2 and T10 vertebral bodies are again noted. Healed left eleventh posterior rib fracture.  CT ABDOMEN AND PELVIS FINDINGS  Hepatobiliary: Ill-defined low-attenuation lesions in the liver are again seen, some of which have decreased slightly in size in the interval. Index lesion in the dome of the liver measures 10 mm (image 38), previously 15 mm. Index left hepatic lobe lesion measures approximately 2.3 x 3.2 cm (series 2, image 46), previously 2.2 x 3.8 cm. Slight irregularity liver margin is indicative of post treatment fibrosis. Gallbladder is unremarkable. No biliary ductal dilatation.  Pancreas: Negative.  Spleen: Negative.  Adrenals/Urinary Tract: Right adrenal gland is unremarkable. Thickening of the medial limb left adrenal gland is  again noted. 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 4 mm low-attenuation lesion in the lower pole left kidney is too small to characterize but statistically, likely represents a cyst. Kidneys are otherwise unremarkable. Ureters are decompressed. Bladder is grossly unremarkable.  Stomach/Bowel: Stomach, small bowel, appendix and colon are unremarkable.  Vascular/Lymphatic: Atherosclerotic calcification of the arterial vasculature without abdominal aortic aneurysm. Left renal vein appears circumaortic. No pathologically enlarged lymph nodes.  Reproductive: Patient is reportedly status post partial hysterectomy. Ovaries are visualized.  Other: A new omental nodule is seen in the right upper  quadrant, just anterior to the gallbladder, measuring 12 mm (series 2, image 61). A midline omental nodule measures 13 mm (image 58), also new. Peritoneal nodules adjacent to the ascending colon have improved however, measuring up to 7 mm (image 16), previously 10 mm. Previously seen right para midline omental nodule (series 2, image 67 on the prior exam) is no longer visualized. Somewhat amorphous fluid density structure in the portacaval station (series 2, image 53) is unchanged and may represent a lymphangioma. No free fluid.  Musculoskeletal: Mixed lytic and sclerotic lesions in the spine and pelvis are grossly unchanged. Lytic lesion in the right femoral neck is also stable and without evidence of pathologic fracture.  IMPRESSION: 1. New omental nodules. Other previously seen omental nodules have resolved or decreased in size. 2. Slight decrease in size of a cutaneous lesion along the lateral right breast as well as within right axillary and mediastinal lymph nodes. 3. Hepatic metastatic disease appears minimally improved. 4. Stable pulmonary and osseous metastatic disease. 5. Small to moderate bilateral pleural effusions, stable to minimally improved. 6. Asymmetric  enlargement of the lower pole left thyroid.   Electronically Signed   By: Lorin Picket M.D.   On: 05/13/2015 09:39   Impression:  The patient is recovering from the effects of radiation. Her MRI shows drastic improvement.  Plan:  MRI in 1 month, then, review in brain conference and then follow-up.  Reserve [facility] for salvage in the event of tumor growth of one or limited number of metastases.    _____________________________________  Sheral Apley Tammi Klippel, M.D.   This document serves as a record of services personally performed by Tyler Pita, MD. It was created on his behalf by Derek Mound, a trained medical scribe. The creation of this record is based on the scribe's personal observations and the provider's statements to them. This document has been checked and approved by the attending provider.

## 2015-06-06 ENCOUNTER — Ambulatory Visit (HOSPITAL_BASED_OUTPATIENT_CLINIC_OR_DEPARTMENT_OTHER): Payer: Medicaid Other

## 2015-06-06 ENCOUNTER — Other Ambulatory Visit (HOSPITAL_BASED_OUTPATIENT_CLINIC_OR_DEPARTMENT_OTHER): Payer: Medicaid Other

## 2015-06-06 VITALS — BP 115/65 | HR 72 | Temp 98.1°F | Resp 16

## 2015-06-06 DIAGNOSIS — Z5111 Encounter for antineoplastic chemotherapy: Secondary | ICD-10-CM

## 2015-06-06 DIAGNOSIS — C50911 Malignant neoplasm of unspecified site of right female breast: Secondary | ICD-10-CM

## 2015-06-06 DIAGNOSIS — C50912 Malignant neoplasm of unspecified site of left female breast: Secondary | ICD-10-CM

## 2015-06-06 DIAGNOSIS — Z5189 Encounter for other specified aftercare: Secondary | ICD-10-CM | POA: Diagnosis not present

## 2015-06-06 DIAGNOSIS — C787 Secondary malignant neoplasm of liver and intrahepatic bile duct: Secondary | ICD-10-CM | POA: Diagnosis not present

## 2015-06-06 LAB — CBC WITH DIFFERENTIAL (CANCER CENTER ONLY)
BASO#: 0 10*3/uL (ref 0.0–0.2)
BASO%: 0 % (ref 0.0–2.0)
EOS%: 0 % (ref 0.0–7.0)
Eosinophils Absolute: 0 10*3/uL (ref 0.0–0.5)
HCT: 29.1 % — ABNORMAL LOW (ref 34.8–46.6)
HGB: 9.8 g/dL — ABNORMAL LOW (ref 11.6–15.9)
LYMPH#: 0.8 10*3/uL — AB (ref 0.9–3.3)
LYMPH%: 25.9 % (ref 14.0–48.0)
MCH: 36.7 pg — ABNORMAL HIGH (ref 26.0–34.0)
MCHC: 33.7 g/dL (ref 32.0–36.0)
MCV: 109 fL — ABNORMAL HIGH (ref 81–101)
MONO#: 0.4 10*3/uL (ref 0.1–0.9)
MONO%: 13.6 % — ABNORMAL HIGH (ref 0.0–13.0)
NEUT%: 60.5 % (ref 39.6–80.0)
NEUTROS ABS: 1.8 10*3/uL (ref 1.5–6.5)
Platelets: 206 10*3/uL (ref 145–400)
RBC: 2.67 10*6/uL — ABNORMAL LOW (ref 3.70–5.32)
RDW: 17.8 % — AB (ref 11.1–15.7)
WBC: 2.9 10*3/uL — ABNORMAL LOW (ref 3.9–10.0)

## 2015-06-06 LAB — CMP (CANCER CENTER ONLY)
ALBUMIN: 3.4 g/dL (ref 3.3–5.5)
ALT(SGPT): 179 U/L — ABNORMAL HIGH (ref 10–47)
AST: 91 U/L — ABNORMAL HIGH (ref 11–38)
Alkaline Phosphatase: 189 U/L — ABNORMAL HIGH (ref 26–84)
BILIRUBIN TOTAL: 1.1 mg/dL (ref 0.20–1.60)
BUN, Bld: 16 mg/dL (ref 7–22)
CHLORIDE: 102 meq/L (ref 98–108)
CO2: 27 mEq/L (ref 18–33)
CREATININE: 0.7 mg/dL (ref 0.6–1.2)
Calcium: 8.9 mg/dL (ref 8.0–10.3)
GLUCOSE: 89 mg/dL (ref 73–118)
Potassium: 3.4 mEq/L (ref 3.3–4.7)
Sodium: 138 mEq/L (ref 128–145)
TOTAL PROTEIN: 6.3 g/dL — AB (ref 6.4–8.1)

## 2015-06-06 MED ORDER — PEGFILGRASTIM 6 MG/0.6ML ~~LOC~~ PSKT
6.0000 mg | PREFILLED_SYRINGE | Freq: Once | SUBCUTANEOUS | Status: AC
  Administered 2015-06-06: 6 mg via SUBCUTANEOUS
  Filled 2015-06-06: qty 0.6

## 2015-06-06 MED ORDER — PROCHLORPERAZINE MALEATE 10 MG PO TABS
10.0000 mg | ORAL_TABLET | Freq: Once | ORAL | Status: AC
  Administered 2015-06-06: 10 mg via ORAL

## 2015-06-06 MED ORDER — SODIUM CHLORIDE 0.9 % IV SOLN
Freq: Once | INTRAVENOUS | Status: AC
  Administered 2015-06-06: 11:00:00 via INTRAVENOUS

## 2015-06-06 MED ORDER — PROCHLORPERAZINE MALEATE 10 MG PO TABS
ORAL_TABLET | ORAL | Status: AC
  Filled 2015-06-06: qty 1

## 2015-06-06 MED ORDER — SODIUM CHLORIDE 0.9 % IJ SOLN
10.0000 mL | INTRAMUSCULAR | Status: DC | PRN
Start: 2015-06-06 — End: 2015-06-06
  Administered 2015-06-06: 10 mL
  Filled 2015-06-06: qty 10

## 2015-06-06 MED ORDER — HEPARIN SOD (PORK) LOCK FLUSH 100 UNIT/ML IV SOLN
500.0000 [IU] | Freq: Once | INTRAVENOUS | Status: AC | PRN
  Administered 2015-06-06: 500 [IU]
  Filled 2015-06-06: qty 5

## 2015-06-06 MED ORDER — SODIUM CHLORIDE 0.9 % IV SOLN
800.0000 mg/m2 | Freq: Once | INTRAVENOUS | Status: AC
  Administered 2015-06-06: 1368 mg via INTRAVENOUS
  Filled 2015-06-06: qty 35.98

## 2015-06-06 NOTE — Patient Instructions (Signed)
[facility] Discharge Instructions for Patients Receiving Chemotherapy  Today you received the following chemotherapy agents Gemzar.  To help prevent nausea and vomiting after your treatment, we encourage you to take your nausea medication.   If you develop nausea and vomiting that is not controlled by your nausea medication, call the clinic.   BELOW ARE SYMPTOMS THAT SHOULD BE REPORTED IMMEDIATELY:  *FEVER GREATER THAN 100.5 F  *CHILLS WITH OR WITHOUT FEVER  NAUSEA AND VOMITING THAT IS NOT CONTROLLED WITH YOUR NAUSEA MEDICATION  *UNUSUAL SHORTNESS OF BREATH  *UNUSUAL BRUISING OR BLEEDING  TENDERNESS IN MOUTH AND THROAT WITH OR WITHOUT PRESENCE OF ULCERS  *URINARY PROBLEMS  *BOWEL PROBLEMS  UNUSUAL RASH Items with * indicate a potential emergency and should be followed up as soon as possible.  Feel free to call the clinic you have any questions or concerns. The clinic phone number is [phone].  Please show the CHEMO ALERT CARD at check-in to the Emergency Department and triage nurse.   

## 2015-06-19 ENCOUNTER — Encounter: Payer: Self-pay | Admitting: Hematology & Oncology

## 2015-06-19 ENCOUNTER — Ambulatory Visit (HOSPITAL_BASED_OUTPATIENT_CLINIC_OR_DEPARTMENT_OTHER): Payer: Medicaid Other | Admitting: Hematology & Oncology

## 2015-06-19 ENCOUNTER — Ambulatory Visit: Payer: Medicaid Other

## 2015-06-19 ENCOUNTER — Other Ambulatory Visit: Payer: Self-pay | Admitting: *Deleted

## 2015-06-19 ENCOUNTER — Other Ambulatory Visit (HOSPITAL_BASED_OUTPATIENT_CLINIC_OR_DEPARTMENT_OTHER): Payer: Medicaid Other

## 2015-06-19 VITALS — BP 128/81 | HR 94 | Temp 98.4°F | Resp 14 | Ht 64.0 in | Wt 150.0 lb

## 2015-06-19 DIAGNOSIS — C50911 Malignant neoplasm of unspecified site of right female breast: Secondary | ICD-10-CM

## 2015-06-19 DIAGNOSIS — K219 Gastro-esophageal reflux disease without esophagitis: Secondary | ICD-10-CM

## 2015-06-19 DIAGNOSIS — C50912 Malignant neoplasm of unspecified site of left female breast: Secondary | ICD-10-CM

## 2015-06-19 DIAGNOSIS — C787 Secondary malignant neoplasm of liver and intrahepatic bile duct: Secondary | ICD-10-CM

## 2015-06-19 DIAGNOSIS — C7931 Secondary malignant neoplasm of brain: Secondary | ICD-10-CM

## 2015-06-19 LAB — CMP (CANCER CENTER ONLY)
ALT(SGPT): 101 U/L — ABNORMAL HIGH (ref 10–47)
AST: 45 U/L — ABNORMAL HIGH (ref 11–38)
Albumin: 3.6 g/dL (ref 3.3–5.5)
Alkaline Phosphatase: 182 U/L — ABNORMAL HIGH (ref 26–84)
BILIRUBIN TOTAL: 0.9 mg/dL (ref 0.20–1.60)
BUN: 7 mg/dL (ref 7–22)
CO2: 28 mEq/L (ref 18–33)
Calcium: 9.4 mg/dL (ref 8.0–10.3)
Chloride: 107 mEq/L (ref 98–108)
Creat: 0.8 mg/dl (ref 0.6–1.2)
GLUCOSE: 114 mg/dL (ref 73–118)
Potassium: 3.7 mEq/L (ref 3.3–4.7)
Sodium: 141 mEq/L (ref 128–145)
Total Protein: 6.7 g/dL (ref 6.4–8.1)

## 2015-06-19 LAB — CBC WITH DIFFERENTIAL (CANCER CENTER ONLY)
BASO#: 0 10*3/uL (ref 0.0–0.2)
BASO%: 0.3 % (ref 0.0–2.0)
EOS%: 0.3 % (ref 0.0–7.0)
Eosinophils Absolute: 0 10*3/uL (ref 0.0–0.5)
HEMATOCRIT: 29.5 % — AB (ref 34.8–46.6)
HGB: 9.8 g/dL — ABNORMAL LOW (ref 11.6–15.9)
LYMPH#: 0.7 10*3/uL — ABNORMAL LOW (ref 0.9–3.3)
LYMPH%: 19.9 % (ref 14.0–48.0)
MCH: 37.5 pg — AB (ref 26.0–34.0)
MCHC: 33.2 g/dL (ref 32.0–36.0)
MCV: 113 fL — ABNORMAL HIGH (ref 81–101)
MONO#: 0.3 10*3/uL (ref 0.1–0.9)
MONO%: 10.4 % (ref 0.0–13.0)
NEUT#: 2.3 10*3/uL (ref 1.5–6.5)
NEUT%: 69.1 % (ref 39.6–80.0)
Platelets: 42 10*3/uL — ABNORMAL LOW (ref 145–400)
RBC: 2.61 10*6/uL — ABNORMAL LOW (ref 3.70–5.32)
RDW: 17.6 % — ABNORMAL HIGH (ref 11.1–15.7)
WBC: 3.3 10*3/uL — AB (ref 3.9–10.0)

## 2015-06-19 LAB — CANCER ANTIGEN 27.29: CA 27.29: 65 U/mL — ABNORMAL HIGH (ref 0–39)

## 2015-06-19 MED ORDER — PROCHLORPERAZINE MALEATE 10 MG PO TABS: 10.0000 mg | ORAL_TABLET | Freq: Four times a day (QID) | ORAL | Status: DC | PRN

## 2015-06-19 MED ORDER — SUCRALFATE 1 GM/10ML PO SUSP: 1.0000 g | Freq: Three times a day (TID) | ORAL | Status: DC

## 2015-06-19 MED ORDER — POTASSIUM CHLORIDE CRYS ER 20 MEQ PO TBCR: 20.0000 meq | EXTENDED_RELEASE_TABLET | Freq: Every day | ORAL | Status: DC

## 2015-06-19 MED ORDER — FLUCONAZOLE 100 MG PO TABS: 100.0000 mg | ORAL_TABLET | Freq: Every day | ORAL | Status: DC

## 2015-06-19 MED ORDER — ESCITALOPRAM OXALATE 20 MG PO TABS: 20.0000 mg | ORAL_TABLET | Freq: Every morning | ORAL | Status: DC

## 2015-06-19 MED ORDER — DEXAMETHASONE 4 MG PO TABS: 8.0000 mg | ORAL_TABLET | Freq: Two times a day (BID) | ORAL | Status: DC

## 2015-06-19 NOTE — Progress Notes (Signed)
Hematology and Oncology Follow Up Visit  Mary [name] [id number] Jun 29, 1956 59 y.o. 06/19/2015   Principle Diagnosis:   Metastatic breast cancer-triple positive  Current Therapy:   Status post 4 cycles of carboplatin/Gemzar Burley Saver /Herceptin  Zometa 4 mg IV every month- will hold this for dental work     Interim History:  Mary [name] is back for follow-up.  She looks pretty good. She feels pretty good. Her mouth does not bother her as much.  She's had no problem with pain. Her appetite seems to be doing a little bit better.  There's been no issues with nausea or vomiting. She's had no change in bowel or bladder habits. She's had no swelling in the legs.  The lesion involving the right breast seems to be healing up slowly but surely.  She's had no headache. Patient had a recent MRI of the brain which showed a very nice improvement in her CNS lesions. Radiation oncology is following this.  Her CA 27.29 has been holding steady. Her last value was 55.  Overall, her performance status is ECOG 1.  Medications:  Current outpatient prescriptions:  .  acidophilus (RISAQUAD) CAPS capsule, Take 1 capsule by mouth daily., Disp: , Rfl:  .  aspirin-acetaminophen-caffeine (EXCEDRIN MIGRAINE) 250-250-65 MG per tablet, Take by mouth every 6 (six) hours as needed for headache., Disp: , Rfl:  .  Cyanocobalamin (VITAMIN B-12 PO), Take by mouth., Disp: , Rfl:  .  dronabinol (MARINOL) 2.5 MG capsule, Take 1 capsule (2.5 mg total) by mouth 2 (two) times daily before a meal., Disp: 60 capsule, Rfl: 3 .  LORazepam (ATIVAN) 0.5 MG tablet, Take 1 tablet (0.5 mg total) by mouth every 6 (six) hours as needed (Nausea or vomiting)., Disp: 30 tablet, Rfl: 0 .  Multiple Vitamin (MULTIVITAMIN WITH MINERALS) TABS tablet, Take 1 tablet by mouth daily., Disp: , Rfl:  .  ondansetron (ZOFRAN) 8 MG tablet, Take 1 tablet (8 mg total) by mouth 2 (two) times daily. Start the day after chemo for 3 days. Then take  as needed for nausea or vomiting., Disp: 30 tablet, Rfl: 1 .  oxyCODONE (OXY IR/ROXICODONE) 5 MG immediate release tablet, Take 1-3 tablets (5-15 mg total) by mouth every 6 (six) hours as needed for severe pain. Take 1-2 if needed for pain due to cancer., Disp: 240 tablet, Rfl: 0 .  pantoprazole (PROTONIX) 40 MG tablet, Take 1 tablet (40 mg total) by mouth 2 (two) times daily., Disp: 60 tablet, Rfl: 4 .  pyridOXINE (VITAMIN B-6) 100 MG tablet, Take 2 tablets (200 mg total) by mouth daily., Disp: 60 tablet, Rfl: 11 .  SUMAtriptan (IMITREX) 100 MG tablet, Take 1 tablet (100 mg total) by mouth every 2 (two) hours as needed for migraine., Disp: 10 tablet, Rfl: 3 .  amLODipine (NORVASC) 5 MG tablet, Take 1 tablet (5 mg total) by mouth every morning. (Patient not taking: Reported on 06/19/2015), Disp: 30 tablet, Rfl: 4 .  dexamethasone (DECADRON) 4 MG tablet, Take 2 tablets (8 mg total) by mouth 2 (two) times daily., Disp: 180 tablet, Rfl: 4 .  escitalopram (LEXAPRO) 20 MG tablet, Take 1 tablet (20 mg total) by mouth every morning., Disp: 30 tablet, Rfl: 4 .  fluconazole (DIFLUCAN) 100 MG tablet, Take 1 tablet (100 mg total) by mouth daily., Disp: 30 tablet, Rfl: 4 .  lisinopril (PRINIVIL,ZESTRIL) 40 MG tablet, Take 1 tablet (40 mg total) by mouth every morning. (Patient not taking: Reported on 06/19/2015), Disp: 30 tablet,  Rfl: 4 .  potassium chloride SA (K-DUR,KLOR-CON) 20 MEQ tablet, Take 1 tablet (20 mEq total) by mouth daily., Disp: 30 tablet, Rfl: 3 .  prochlorperazine (COMPAZINE) 10 MG tablet, Take 1 tablet (10 mg total) by mouth every 6 (six) hours as needed for nausea or vomiting., Disp: 120 tablet, Rfl: 3 .  sucralfate (CARAFATE) 1 GM/10ML suspension, Take 10 mLs (1 g total) by mouth 4 (four) times daily -  with meals and at bedtime., Disp: 420 mL, Rfl: 3  Allergies:  Allergies  Allergen Reactions  . Hydrocodone     "makes me crawl on the floor"  . Aspirin Other (See Comments)    Due to ulcers    . Penicillins Swelling    Past Medical History, Surgical history, Social history, and Family History were reviewed and updated.  Review of Systems: As above  Physical Exam:  height is 5' 4"  (1.626 m) and weight is 150 lb (68.04 kg). Her oral temperature is 98.4 F (36.9 C). Her blood pressure is 128/81 and her pulse is 94. Her respiration is 14.   Fairly well-built and well-nourished African-American female. She has no ocular or oral lesions. There is no palpable cervical or supraclavicular lymph nodes. Lungs are clear. Cardiac exam regular rate and rhythm with no murmurs, rubs or bruits. Breast exam shows some moderate swelling of the right breast. She still has some firmness about the areola. She does have some palpable right axillary lymph nodes. She has a dressing over her wound site in the right axilla. Left breast is unremarkable remarkable. Abdomen is soft. She has good bowel sounds. There is no fluid wave. There is no palpable liver or spleen tip. Extremity shows no clubbing, cyanosis or edema. Neurological exam shows no focal neurological deficits. Skin exam shows no rashes, ecchymoses or petechia.  Lab Results  Component Value Date   WBC 3.3* 06/19/2015   HGB 9.8* 06/19/2015   HCT 29.5* 06/19/2015   MCV 113* 06/19/2015   PLT 42 Platelet count confirmed by slide estimate* 06/19/2015     Chemistry      Component Value Date/Time   NA 141 06/19/2015 1118   NA 137 04/09/2014 0450   K 3.7 06/19/2015 1118   K 3.5* 04/09/2014 0450   CL 107 06/19/2015 1118   CL 101 04/09/2014 0450   CO2 28 06/19/2015 1118   CO2 23 04/09/2014 0450   BUN 7 06/19/2015 1118   BUN 5* 04/09/2014 0450   CREATININE 0.8 06/19/2015 1118   CREATININE 0.59 04/09/2014 0450      Component Value Date/Time   CALCIUM 9.4 06/19/2015 1118   CALCIUM 9.3 04/09/2014 0450   ALKPHOS 182* 06/19/2015 1118   ALKPHOS 179* 04/07/2014 0630   AST 45* 06/19/2015 1118   AST 74* 04/07/2014 0630   ALT 101* 06/19/2015  1118   ALT 34 04/07/2014 0630   BILITOT 0.90 06/19/2015 1118   BILITOT 0.7 04/07/2014 0630         Impression and Plan: Mary [name] is 59 year old African American female. She is postmenopausal. She has a triple positive metastatic breast cancer.   We will have to hold her chemotherapy this week. I do think her blood count is just too low to treat her.  Her last CT scans were done back in May. I think we probably will get another set of scans in late July.  Since she is triple positive, I wonder if we might be able to get away with just anti-HER-2 therapy  at some point for "maintenance"therapy.  We will have her come back in one week for follow-up. If her labs are okay, we will proceed with therapy.  Volanda Napoleon, MD 6/22/20166:22 PM

## 2015-06-26 ENCOUNTER — Ambulatory Visit (HOSPITAL_BASED_OUTPATIENT_CLINIC_OR_DEPARTMENT_OTHER): Payer: Medicaid Other

## 2015-06-26 ENCOUNTER — Other Ambulatory Visit (HOSPITAL_BASED_OUTPATIENT_CLINIC_OR_DEPARTMENT_OTHER): Payer: Medicaid Other

## 2015-06-26 DIAGNOSIS — C7931 Secondary malignant neoplasm of brain: Secondary | ICD-10-CM | POA: Diagnosis not present

## 2015-06-26 DIAGNOSIS — C787 Secondary malignant neoplasm of liver and intrahepatic bile duct: Secondary | ICD-10-CM | POA: Diagnosis not present

## 2015-06-26 DIAGNOSIS — C50919 Malignant neoplasm of unspecified site of unspecified female breast: Secondary | ICD-10-CM

## 2015-06-26 DIAGNOSIS — Z5111 Encounter for antineoplastic chemotherapy: Secondary | ICD-10-CM

## 2015-06-26 DIAGNOSIS — C50911 Malignant neoplasm of unspecified site of right female breast: Secondary | ICD-10-CM | POA: Diagnosis not present

## 2015-06-26 DIAGNOSIS — Z5112 Encounter for antineoplastic immunotherapy: Secondary | ICD-10-CM

## 2015-06-26 DIAGNOSIS — K219 Gastro-esophageal reflux disease without esophagitis: Secondary | ICD-10-CM

## 2015-06-26 LAB — CBC WITH DIFFERENTIAL (CANCER CENTER ONLY)
BASO#: 0 10*3/uL (ref 0.0–0.2)
BASO%: 0.3 % (ref 0.0–2.0)
EOS%: 0.6 % (ref 0.0–7.0)
Eosinophils Absolute: 0 10*3/uL (ref 0.0–0.5)
HCT: 31.2 % — ABNORMAL LOW (ref 34.8–46.6)
HGB: 10.4 g/dL — ABNORMAL LOW (ref 11.6–15.9)
LYMPH#: 0.7 10*3/uL — ABNORMAL LOW (ref 0.9–3.3)
LYMPH%: 23.2 % (ref 14.0–48.0)
MCH: 37.4 pg — ABNORMAL HIGH (ref 26.0–34.0)
MCHC: 33.3 g/dL (ref 32.0–36.0)
MCV: 112 fL — AB (ref 81–101)
MONO#: 0.8 10*3/uL (ref 0.1–0.9)
MONO%: 25.2 % — AB (ref 0.0–13.0)
NEUT%: 50.7 % (ref 39.6–80.0)
NEUTROS ABS: 1.6 10*3/uL (ref 1.5–6.5)
PLATELETS: 299 10*3/uL (ref 145–400)
RBC: 2.78 10*6/uL — ABNORMAL LOW (ref 3.70–5.32)
RDW: 17.7 % — ABNORMAL HIGH (ref 11.1–15.7)
WBC: 3.1 10*3/uL — ABNORMAL LOW (ref 3.9–10.0)

## 2015-06-26 LAB — CMP (CANCER CENTER ONLY)
ALBUMIN: 3.5 g/dL (ref 3.3–5.5)
ALT(SGPT): 38 U/L (ref 10–47)
AST: 34 U/L (ref 11–38)
Alkaline Phosphatase: 133 U/L — ABNORMAL HIGH (ref 26–84)
BUN: 9 mg/dL (ref 7–22)
CALCIUM: 9.3 mg/dL (ref 8.0–10.3)
CHLORIDE: 109 meq/L — AB (ref 98–108)
CO2: 23 mEq/L (ref 18–33)
Creat: 1 mg/dl (ref 0.6–1.2)
Glucose, Bld: 126 mg/dL — ABNORMAL HIGH (ref 73–118)
POTASSIUM: 4.2 meq/L (ref 3.3–4.7)
Sodium: 137 mEq/L (ref 128–145)
Total Bilirubin: 0.7 mg/dl (ref 0.20–1.60)
Total Protein: 6.2 g/dL — ABNORMAL LOW (ref 6.4–8.1)

## 2015-06-26 MED ORDER — SODIUM CHLORIDE 0.9 % IJ SOLN
10.0000 mL | INTRAMUSCULAR | Status: DC | PRN
  Administered 2015-06-26: 10 mL
  Filled 2015-06-26: qty 10

## 2015-06-26 MED ORDER — SODIUM CHLORIDE 0.9 % IV SOLN
439.5000 mg | Freq: Once | INTRAVENOUS | Status: AC
  Administered 2015-06-26: 440 mg via INTRAVENOUS
  Filled 2015-06-26: qty 44

## 2015-06-26 MED ORDER — HEPARIN SOD (PORK) LOCK FLUSH 100 UNIT/ML IV SOLN
500.0000 [IU] | Freq: Once | INTRAVENOUS | Status: AC | PRN
  Administered 2015-06-26: 500 [IU]
  Filled 2015-06-26: qty 5

## 2015-06-26 MED ORDER — ACETAMINOPHEN 325 MG PO TABS
ORAL_TABLET | ORAL | Status: AC
  Filled 2015-06-26: qty 2

## 2015-06-26 MED ORDER — DIPHENHYDRAMINE HCL 25 MG PO CAPS
50.0000 mg | ORAL_CAPSULE | Freq: Once | ORAL | Status: AC
Start: 2015-06-26 — End: 2015-06-26
  Administered 2015-06-26: 50 mg via ORAL

## 2015-06-26 MED ORDER — TRASTUZUMAB CHEMO INJECTION 440 MG
6.0000 mg/kg | Freq: Once | INTRAVENOUS | Status: AC
  Administered 2015-06-26: 399 mg via INTRAVENOUS
  Filled 2015-06-26: qty 19

## 2015-06-26 MED ORDER — PERTUZUMAB CHEMO INJECTION 420 MG/14ML
420.0000 mg | Freq: Once | INTRAVENOUS | Status: AC
  Administered 2015-06-26: 420 mg via INTRAVENOUS
  Filled 2015-06-26: qty 14

## 2015-06-26 MED ORDER — DEXAMETHASONE SODIUM PHOSPHATE 100 MG/10ML IJ SOLN
Freq: Once | INTRAMUSCULAR | Status: AC
  Administered 2015-06-26: 14:00:00 via INTRAVENOUS
  Filled 2015-06-26: qty 8

## 2015-06-26 MED ORDER — ACETAMINOPHEN 325 MG PO TABS
650.0000 mg | ORAL_TABLET | Freq: Once | ORAL | Status: AC
  Administered 2015-06-26: 650 mg via ORAL

## 2015-06-26 MED ORDER — SODIUM CHLORIDE 0.9 % IV SOLN
720.0000 mg/m2 | Freq: Once | INTRAVENOUS | Status: AC
  Administered 2015-06-26: 1254 mg via INTRAVENOUS
  Filled 2015-06-26: qty 32.98

## 2015-06-26 MED ORDER — DIPHENHYDRAMINE HCL 25 MG PO CAPS
ORAL_CAPSULE | ORAL | Status: AC
  Filled 2015-06-26: qty 2

## 2015-06-26 MED ORDER — SODIUM CHLORIDE 0.9 % IV SOLN
Freq: Once | INTRAVENOUS | Status: AC
  Administered 2015-06-26: 13:00:00 via INTRAVENOUS

## 2015-06-26 NOTE — Patient Instructions (Signed)
Carboplatin injection What is this medicine? CARBOPLATIN (KAR boe pla tin) is a chemotherapy drug. It targets fast dividing cells, like cancer cells, and causes these cells to die. This medicine is used to treat ovarian cancer and many other cancers. This medicine may be used for other purposes; ask your health care provider or pharmacist if you have questions. COMMON BRAND NAME(S): Paraplatin What should I tell my health care provider before I take this medicine? They need to know if you have any of these conditions: -blood disorders -hearing problems -kidney disease -recent or ongoing radiation therapy -an unusual or allergic reaction to carboplatin, cisplatin, other chemotherapy, other medicines, foods, dyes, or preservatives -pregnant or trying to get pregnant -breast-feeding How should I use this medicine? This drug is usually given as an infusion into a vein. It is administered in a hospital or clinic by a specially trained health care professional. Talk to your pediatrician regarding the use of this medicine in children. Special care may be needed. Overdosage: If you think you have taken too much of this medicine contact a poison control center or emergency room at once. NOTE: This medicine is only for you. Do not share this medicine with others. What if I miss a dose? It is important not to miss a dose. Call your doctor or health care professional if you are unable to keep an appointment. What may interact with this medicine? -medicines for seizures -medicines to increase blood counts like filgrastim, pegfilgrastim, sargramostim -some antibiotics like amikacin, gentamicin, neomycin, streptomycin, tobramycin -vaccines Talk to your doctor or health care professional before taking any of these medicines: -acetaminophen -aspirin -ibuprofen -ketoprofen -naproxen This list may not describe all possible interactions. Give your health care provider a list of all the medicines, herbs,  non-prescription drugs, or dietary supplements you use. Also tell them if you smoke, drink alcohol, or use illegal drugs. Some items may interact with your medicine. What should I watch for while using this medicine? Your condition will be monitored carefully while you are receiving this medicine. You will need important blood work done while you are taking this medicine. This drug may make you feel generally unwell. This is not uncommon, as chemotherapy can affect healthy cells as well as cancer cells. Report any side effects. Continue your course of treatment even though you feel ill unless your doctor tells you to stop. In some cases, you may be given additional medicines to help with side effects. Follow all directions for their use. Call your doctor or health care professional for advice if you get a fever, chills or sore throat, or other symptoms of a cold or flu. Do not treat yourself. This drug decreases your body's ability to fight infections. Try to avoid being around people who are sick. This medicine may increase your risk to bruise or bleed. Call your doctor or health care professional if you notice any unusual bleeding. Be careful brushing and flossing your teeth or using a toothpick because you may get an infection or bleed more easily. If you have any dental work done, tell your dentist you are receiving this medicine. Avoid taking products that contain aspirin, acetaminophen, ibuprofen, naproxen, or ketoprofen unless instructed by your doctor. These medicines may hide a fever. Do not become pregnant while taking this medicine. Women should inform their doctor if they wish to become pregnant or think they might be pregnant. There is a potential for serious side effects to an unborn child. Talk to your health care professional or  pharmacist for more information. Do not breast-feed an infant while taking this medicine. What side effects may I notice from receiving this medicine? Side effects  that you should report to your doctor or health care professional as soon as possible: -allergic reactions like skin rash, itching or hives, swelling of the face, lips, or tongue -signs of infection - fever or chills, cough, sore throat, pain or difficulty passing urine -signs of decreased platelets or bleeding - bruising, pinpoint red spots on the skin, black, tarry stools, nosebleeds -signs of decreased red blood cells - unusually weak or tired, fainting spells, lightheadedness -breathing problems -changes in hearing -changes in vision -chest pain -high blood pressure -low blood counts - This drug may decrease the number of white blood cells, red blood cells and platelets. You may be at increased risk for infections and bleeding. -nausea and vomiting -pain, swelling, redness or irritation at the injection site -pain, tingling, numbness in the hands or feet -problems with balance, talking, walking -trouble passing urine or change in the amount of urine Side effects that usually do not require medical attention (report to your doctor or health care professional if they continue or are bothersome): -hair loss -loss of appetite -metallic taste in the mouth or changes in taste This list may not describe all possible side effects. Call your doctor for medical advice about side effects. You may report side effects to FDA at 1-800-FDA-1088. Where should I keep my medicine? This drug is given in a hospital or clinic and will not be stored at home. NOTE: This sheet is a summary. It may not cover all possible information. If you have questions about this medicine, talk to your doctor, pharmacist, or health care provider.  2015, [employer]. (2008-03-20 14:38:05) Pertuzumab injection What is this medicine? PERTUZUMAB (per TOOZ ue mab) is a monoclonal antibody that targets a protein called HER2. HER2 is found in some breast cancers. This medicine can stop cancer cell growth. This medicine  is used with other cancer treatments. This medicine may be used for other purposes; ask your health care provider or pharmacist if you have questions. COMMON BRAND NAME(S): PERJETA What should I tell my health care provider before I take this medicine? They need to know if you have any of these conditions: -heart disease -heart failure -high blood pressure -history of irregular heart beat -recent or ongoing radiation therapy -an unusual or allergic reaction to pertuzumab, other medicines, foods, dyes, or preservatives -pregnant or trying to get pregnant -breast-feeding How should I use this medicine? This medicine is for infusion into a vein. It is given by a health care professional in a hospital or clinic setting. Talk to your pediatrician regarding the use of this medicine in children. Special care may be needed. Overdosage: If you think you've taken too much of this medicine contact a poison control center or emergency room at once. Overdosage: If you think you have taken too much of this medicine contact a poison control center or emergency room at once. NOTE: This medicine is only for you. Do not share this medicine with others. What if I miss a dose? It is important not to miss your dose. Call your doctor or health care professional if you are unable to keep an appointment. What may interact with this medicine? Interactions are not expected. Give your health care provider a list of all the medicines, herbs, non-prescription drugs, or dietary supplements you use. Also tell them if you smoke, drink alcohol, or use illegal  drugs. Some items may interact with your medicine. This list may not describe all possible interactions. Give your health care provider a list of all the medicines, herbs, non-prescription drugs, or dietary supplements you use. Also tell them if you smoke, drink alcohol, or use illegal drugs. Some items may interact with your medicine. What should I watch for while  using this medicine? Your condition will be monitored carefully while you are receiving this medicine. Report any side effects. Continue your course of treatment even though you feel ill unless your doctor tells you to stop. Do not become pregnant while taking this medicine. Women should inform their doctor if they wish to become pregnant or think they might be pregnant. There is a potential for serious side effects to an unborn child. Talk to your health care professional or pharmacist for more information. Do not breast-feed an infant while taking this medicine. Call your doctor or health care professional for advice if you get a fever, chills or sore throat, or other symptoms of a cold or flu. Do not treat yourself. Try to avoid being around people who are sick. You may experience fever, chills, and headache during the infusion. Report any side effects during the infusion to your health care professional. What side effects may I notice from receiving this medicine? Side effects that you should report to your doctor or health care professional as soon as possible: -breathing problems -chest pain or palpitations -dizziness -feeling faint or lightheaded -fever or chills -skin rash, itching or hives -sore throat -swelling of the face, lips, or tongue -swelling of the legs or ankles -unusually weak or tired Side effects that usually do not require medical attention (Report these to your doctor or health care professional if they continue or are bothersome.): -diarrhea -hair loss -nausea, vomiting -tiredness This list may not describe all possible side effects. Call your doctor for medical advice about side effects. You may report side effects to FDA at 1-800-FDA-1088. Where should I keep my medicine? This drug is given in a hospital or clinic and will not be stored at home. NOTE: This sheet is a summary. It may not cover all possible information. If you have questions about this medicine, talk  to your doctor, pharmacist, or health care provider.  2015, [employer] Standard. (2012-10-12 16:54:15) Trastuzumab injection for infusion What is this medicine? TRASTUZUMAB (tras TOO zoo mab) is a monoclonal antibody. It targets a protein called HER2. This protein is found in some stomach and breast cancers. This medicine can stop cancer cell growth. This medicine may be used with other cancer treatments. This medicine may be used for other purposes; ask your health care provider or pharmacist if you have questions. COMMON BRAND NAME(S): Herceptin What should I tell my health care provider before I take this medicine? They need to know if you have any of these conditions: -heart disease -heart failure -infection (especially a virus infection such as chickenpox, cold sores, or herpes) -lung or breathing disease, like asthma -recent or ongoing radiation therapy -an unusual or allergic reaction to trastuzumab, benzyl alcohol, or other medications, foods, dyes, or preservatives -pregnant or trying to get pregnant -breast-feeding How should I use this medicine? This drug is given as an infusion into a vein. It is administered in a hospital or clinic by a specially trained health care professional. Talk to your pediatrician regarding the use of this medicine in children. This medicine is not approved for use in children. Overdosage: If you think you have taken  too much of this medicine contact a poison control center or emergency room at once. NOTE: This medicine is only for you. Do not share this medicine with others. What if I miss a dose? It is important not to miss a dose. Call your doctor or health care professional if you are unable to keep an appointment. What may interact with this medicine? -cyclophosphamide -doxorubicin -warfarin This list may not describe all possible interactions. Give your health care provider a list of all the medicines, herbs, non-prescription drugs, or  dietary supplements you use. Also tell them if you smoke, drink alcohol, or use illegal drugs. Some items may interact with your medicine. What should I watch for while using this medicine? Visit your doctor for checks on your progress. Report any side effects. Continue your course of treatment even though you feel ill unless your doctor tells you to stop. Call your doctor or health care professional for advice if you get a fever, chills or sore throat, or other symptoms of a cold or flu. Do not treat yourself. Try to avoid being around people who are sick. You may experience fever, chills and shaking during your first infusion. These effects are usually mild and can be treated with other medicines. Report any side effects during the infusion to your health care professional. Fever and chills usually do not happen with later infusions. What side effects may I notice from receiving this medicine? Side effects that you should report to your doctor or other health care professional as soon as possible: -breathing difficulties -chest pain or palpitations -cough -dizziness or fainting -fever or chills, sore throat -skin rash, itching or hives -swelling of the legs or ankles -unusually weak or tired Side effects that usually do not require medical attention (report to your doctor or other health care professional if they continue or are bothersome): -loss of appetite -headache -muscle aches -nausea This list may not describe all possible side effects. Call your doctor for medical advice about side effects. You may report side effects to FDA at 1-800-FDA-1088. Where should I keep my medicine? This drug is given in a hospital or clinic and will not be stored at home. NOTE: This sheet is a summary. It may not cover all possible information. If you have questions about this medicine, talk to your doctor, pharmacist, or health care provider.  2015, [employer] Standard. (2009-10-18 13:43:15)

## 2015-07-02 ENCOUNTER — Other Ambulatory Visit: Payer: Self-pay | Admitting: Oncology

## 2015-07-02 DIAGNOSIS — C50911 Malignant neoplasm of unspecified site of right female breast: Secondary | ICD-10-CM

## 2015-07-03 ENCOUNTER — Ambulatory Visit: Payer: Medicaid Other

## 2015-07-03 ENCOUNTER — Other Ambulatory Visit: Payer: Medicaid Other

## 2015-07-05 ENCOUNTER — Ambulatory Visit: Payer: Medicaid Other

## 2015-07-05 ENCOUNTER — Other Ambulatory Visit: Payer: Medicaid Other

## 2015-07-08 ENCOUNTER — Other Ambulatory Visit: Payer: Self-pay | Admitting: Family

## 2015-07-08 ENCOUNTER — Ambulatory Visit (HOSPITAL_BASED_OUTPATIENT_CLINIC_OR_DEPARTMENT_OTHER): Payer: Medicaid Other

## 2015-07-08 ENCOUNTER — Other Ambulatory Visit (HOSPITAL_BASED_OUTPATIENT_CLINIC_OR_DEPARTMENT_OTHER): Payer: Medicaid Other

## 2015-07-08 ENCOUNTER — Ambulatory Visit (HOSPITAL_BASED_OUTPATIENT_CLINIC_OR_DEPARTMENT_OTHER)
Admission: RE | Admit: 2015-07-08 | Discharge: 2015-07-08 | Disposition: A | Discharge status: Home / Self Care | Payer: Medicaid Other | Source: Ambulatory Visit | Attending: Family | Admitting: Family

## 2015-07-08 VITALS — BP 134/82 | HR 64 | Temp 98.2°F | Resp 18

## 2015-07-08 DIAGNOSIS — Z5111 Encounter for antineoplastic chemotherapy: Secondary | ICD-10-CM

## 2015-07-08 DIAGNOSIS — C7931 Secondary malignant neoplasm of brain: Secondary | ICD-10-CM | POA: Diagnosis not present

## 2015-07-08 DIAGNOSIS — C50911 Malignant neoplasm of unspecified site of right female breast: Secondary | ICD-10-CM

## 2015-07-08 DIAGNOSIS — C50919 Malignant neoplasm of unspecified site of unspecified female breast: Secondary | ICD-10-CM

## 2015-07-08 DIAGNOSIS — C787 Secondary malignant neoplasm of liver and intrahepatic bile duct: Secondary | ICD-10-CM | POA: Diagnosis not present

## 2015-07-08 DIAGNOSIS — C7951 Secondary malignant neoplasm of bone: Secondary | ICD-10-CM | POA: Diagnosis not present

## 2015-07-08 DIAGNOSIS — M4686 Other specified inflammatory spondylopathies, lumbar region: Secondary | ICD-10-CM | POA: Insufficient documentation

## 2015-07-08 DIAGNOSIS — M545 Low back pain: Secondary | ICD-10-CM

## 2015-07-08 LAB — CBC WITH DIFFERENTIAL (CANCER CENTER ONLY)
BASO#: 0 10*3/uL (ref 0.0–0.2)
BASO%: 0.3 % (ref 0.0–2.0)
EOS ABS: 0 10*3/uL (ref 0.0–0.5)
EOS%: 0 % (ref 0.0–7.0)
HCT: 26.5 % — ABNORMAL LOW (ref 34.8–46.6)
HGB: 8.9 g/dL — ABNORMAL LOW (ref 11.6–15.9)
LYMPH#: 0.7 10*3/uL — ABNORMAL LOW (ref 0.9–3.3)
LYMPH%: 20.9 % (ref 14.0–48.0)
MCH: 36.9 pg — AB (ref 26.0–34.0)
MCHC: 33.6 g/dL (ref 32.0–36.0)
MCV: 110 fL — ABNORMAL HIGH (ref 81–101)
MONO#: 0.6 10*3/uL (ref 0.1–0.9)
MONO%: 19 % — ABNORMAL HIGH (ref 0.0–13.0)
NEUT#: 1.9 10*3/uL (ref 1.5–6.5)
NEUT%: 59.8 % (ref 39.6–80.0)
Platelets: 76 10*3/uL — ABNORMAL LOW (ref 145–400)
RBC: 2.41 10*6/uL — ABNORMAL LOW (ref 3.70–5.32)
RDW: 16.2 % — ABNORMAL HIGH (ref 11.1–15.7)
WBC: 3.2 10*3/uL — ABNORMAL LOW (ref 3.9–10.0)

## 2015-07-08 LAB — CMP (CANCER CENTER ONLY)
ALBUMIN: 3.2 g/dL — AB (ref 3.3–5.5)
ALT(SGPT): 58 U/L — ABNORMAL HIGH (ref 10–47)
AST: 54 U/L — ABNORMAL HIGH (ref 11–38)
Alkaline Phosphatase: 119 U/L — ABNORMAL HIGH (ref 26–84)
BUN: 11 mg/dL (ref 7–22)
CO2: 23 mEq/L (ref 18–33)
Calcium: 8.7 mg/dL (ref 8.0–10.3)
Chloride: 112 mEq/L — ABNORMAL HIGH (ref 98–108)
Creat: 0.7 mg/dl (ref 0.6–1.2)
Glucose, Bld: 120 mg/dL — ABNORMAL HIGH (ref 73–118)
Potassium: 3.5 mEq/L (ref 3.3–4.7)
Sodium: 135 mEq/L (ref 128–145)
Total Bilirubin: 0.6 mg/dl (ref 0.20–1.60)
Total Protein: 5.8 g/dL — ABNORMAL LOW (ref 6.4–8.1)

## 2015-07-08 MED ORDER — PEGFILGRASTIM 6 MG/0.6ML ~~LOC~~ PSKT
6.0000 mg | PREFILLED_SYRINGE | Freq: Once | SUBCUTANEOUS | Status: AC
  Administered 2015-07-08: 6 mg via SUBCUTANEOUS
  Filled 2015-07-08: qty 0.6

## 2015-07-08 MED ORDER — DULOXETINE HCL 30 MG PO CPEP: 30.0000 mg | ORAL_CAPSULE | Freq: Every day | ORAL | Status: DC

## 2015-07-08 MED ORDER — OXYCODONE HCL 5 MG PO TABS: 5.0000 mg | ORAL_TABLET | Freq: Four times a day (QID) | ORAL | Status: DC | PRN

## 2015-07-08 MED ORDER — GEMCITABINE HCL CHEMO INJECTION 1 GM/26.3ML
720.0000 mg/m2 | Freq: Once | INTRAVENOUS | Status: AC
Start: 2015-07-08 — End: 2015-07-08
  Administered 2015-07-08: 1254 mg via INTRAVENOUS
  Filled 2015-07-08: qty 32.98

## 2015-07-08 MED ORDER — PROCHLORPERAZINE MALEATE 10 MG PO TABS
10.0000 mg | ORAL_TABLET | Freq: Once | ORAL | Status: AC
  Administered 2015-07-08: 10 mg via ORAL

## 2015-07-08 MED ORDER — HEPARIN SOD (PORK) LOCK FLUSH 100 UNIT/ML IV SOLN
500.0000 [IU] | Freq: Once | INTRAVENOUS | Status: AC | PRN
  Administered 2015-07-08: 500 [IU]
  Filled 2015-07-08: qty 5

## 2015-07-08 MED ORDER — SODIUM CHLORIDE 0.9 % IJ SOLN
10.0000 mL | INTRAMUSCULAR | Status: DC | PRN
  Administered 2015-07-08: 10 mL
  Filled 2015-07-08: qty 10

## 2015-07-08 MED ORDER — PROCHLORPERAZINE MALEATE 10 MG PO TABS
ORAL_TABLET | ORAL | Status: AC
  Filled 2015-07-08: qty 1

## 2015-07-08 MED ORDER — SODIUM CHLORIDE 0.9 % IV SOLN
Freq: Once | INTRAVENOUS | Status: AC
  Administered 2015-07-08: 13:00:00 via INTRAVENOUS

## 2015-07-08 NOTE — Progress Notes (Signed)
Ok to treat per Dr Marin Olp with platelets of 74.

## 2015-07-08 NOTE — Patient Instructions (Signed)

## 2015-07-09 ENCOUNTER — Other Ambulatory Visit: Payer: Self-pay | Admitting: Family

## 2015-07-09 NOTE — Progress Notes (Signed)
Mary [name] states that she is having lower back pain that is only relieved for 2-3 hours with oxycodone. We got an xray of her lumbar spine which showed no new abnormality. 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 We will try her on Cymbalta 30 mg PO daily and see if this helps. She is in agreement with this. She will call with any other questions or concerns.

## 2015-07-15 ENCOUNTER — Ambulatory Visit: Payer: Medicaid Other

## 2015-07-15 ENCOUNTER — Telehealth: Payer: Self-pay | Admitting: Hematology & Oncology

## 2015-07-15 ENCOUNTER — Ambulatory Visit (HOSPITAL_BASED_OUTPATIENT_CLINIC_OR_DEPARTMENT_OTHER): Payer: Medicaid Other | Admitting: Hematology & Oncology

## 2015-07-15 ENCOUNTER — Encounter: Payer: Self-pay | Admitting: Hematology & Oncology

## 2015-07-15 ENCOUNTER — Other Ambulatory Visit (HOSPITAL_BASED_OUTPATIENT_CLINIC_OR_DEPARTMENT_OTHER): Payer: Medicaid Other

## 2015-07-15 VITALS — BP 120/78 | HR 105 | Temp 98.6°F | Resp 16 | Ht 62.0 in | Wt 158.0 lb

## 2015-07-15 DIAGNOSIS — C787 Secondary malignant neoplasm of liver and intrahepatic bile duct: Secondary | ICD-10-CM | POA: Diagnosis not present

## 2015-07-15 DIAGNOSIS — K219 Gastro-esophageal reflux disease without esophagitis: Secondary | ICD-10-CM

## 2015-07-15 DIAGNOSIS — C50911 Malignant neoplasm of unspecified site of right female breast: Secondary | ICD-10-CM | POA: Diagnosis not present

## 2015-07-15 DIAGNOSIS — C7931 Secondary malignant neoplasm of brain: Secondary | ICD-10-CM | POA: Diagnosis not present

## 2015-07-15 DIAGNOSIS — Z170 Estrogen receptor positive status [ER+]: Secondary | ICD-10-CM

## 2015-07-15 LAB — CMP (CANCER CENTER ONLY)
ALT(SGPT): 126 U/L — ABNORMAL HIGH (ref 10–47)
AST: 150 U/L — ABNORMAL HIGH (ref 11–38)
Albumin: 3.4 g/dL (ref 3.3–5.5)
Alkaline Phosphatase: 185 U/L — ABNORMAL HIGH (ref 26–84)
BUN: 17 mg/dL (ref 7–22)
CHLORIDE: 107 meq/L (ref 98–108)
CO2: 25 mEq/L (ref 18–33)
CREATININE: 0.9 mg/dL (ref 0.6–1.2)
Calcium: 9.2 mg/dL (ref 8.0–10.3)
Glucose, Bld: 116 mg/dL (ref 73–118)
Potassium: 3.1 mEq/L — ABNORMAL LOW (ref 3.3–4.7)
Sodium: 138 mEq/L (ref 128–145)
Total Bilirubin: 0.7 mg/dl (ref 0.20–1.60)
Total Protein: 6.1 g/dL — ABNORMAL LOW (ref 6.4–8.1)

## 2015-07-15 LAB — CBC WITH DIFFERENTIAL (CANCER CENTER ONLY)
BASO#: 0 10*3/uL (ref 0.0–0.2)
BASO%: 0 % (ref 0.0–2.0)
EOS ABS: 0 10*3/uL (ref 0.0–0.5)
EOS%: 0 % (ref 0.0–7.0)
HCT: 26.8 % — ABNORMAL LOW (ref 34.8–46.6)
HGB: 9.1 g/dL — ABNORMAL LOW (ref 11.6–15.9)
LYMPH#: 1.1 10*3/uL (ref 0.9–3.3)
LYMPH%: 4.9 % — ABNORMAL LOW (ref 14.0–48.0)
MCH: 37.1 pg — ABNORMAL HIGH (ref 26.0–34.0)
MCHC: 34 g/dL (ref 32.0–36.0)
MCV: 109 fL — AB (ref 81–101)
MONO#: 1.6 10*3/uL — ABNORMAL HIGH (ref 0.1–0.9)
MONO%: 7.4 % (ref 0.0–13.0)
NEUT#: 18.7 10*3/uL — ABNORMAL HIGH (ref 1.5–6.5)
NEUT%: 87.7 % — ABNORMAL HIGH (ref 39.6–80.0)
Platelets: 50 10*3/uL — ABNORMAL LOW (ref 145–400)
RBC: 2.45 10*6/uL — ABNORMAL LOW (ref 3.70–5.32)
RDW: 16.8 % — ABNORMAL HIGH (ref 11.1–15.7)
WBC: 21.4 10*3/uL — AB (ref 3.9–10.0)

## 2015-07-15 NOTE — Telephone Encounter (Signed)
Spoke with pt regarding 7/26 appt start time. (11:00). Pt confirmed start time

## 2015-07-15 NOTE — Progress Notes (Signed)
Hematology and Oncology Follow Up Visit  Mary [name] [id number] 1956/02/28 59 y.o. 07/15/2015   Principle Diagnosis:   Metastatic breast cancer-triple positive  Current Therapy:   Status post 4 cycles of carboplatin/Gemzar Burley Saver /Herceptin  Zometa 4 mg IV every month- will hold this for dental work     Interim History:  [name] is back for follow-up.  She seems to be doing pretty well. She had a good weekend.  She's not complaining much the way of pain.  We have had some treatment delays because of her blood counts.  She's not having any issues with her teeth.  Her CA 27.29 has been going up slowly. In June, her CA 27.29 was 65.  She's had no cough. There's been no shortness of breath. She's had a little bit of constipation.  There's been no leg swelling. She's had no rashes.  She's had no headache. There is no visual issues.  Overall, her performance status is ECOG 1.  Medications:  Current outpatient prescriptions:  .  acidophilus (RISAQUAD) CAPS capsule, Take 1 capsule by mouth daily., Disp: , Rfl:  .  amLODipine (NORVASC) 5 MG tablet, Take 1 tablet (5 mg total) by mouth every morning., Disp: 30 tablet, Rfl: 4 .  aspirin-acetaminophen-caffeine (EXCEDRIN MIGRAINE) 580-998-33 MG per tablet, Take by mouth every 6 (six) hours as needed for headache., Disp: , Rfl:  .  Cyanocobalamin (VITAMIN B-12 PO), Take by mouth., Disp: , Rfl:  .  dexamethasone (DECADRON) 4 MG tablet, Take 2 tablets (8 mg total) by mouth 2 (two) times daily., Disp: 180 tablet, Rfl: 4 .  dronabinol (MARINOL) 2.5 MG capsule, Take 1 capsule (2.5 mg total) by mouth 2 (two) times daily before a meal., Disp: 60 capsule, Rfl: 3 .  DULoxetine (CYMBALTA) 30 MG capsule, Take 1 capsule (30 mg total) by mouth daily., Disp: 30 capsule, Rfl: 3 .  escitalopram (LEXAPRO) 20 MG tablet, Take 1 tablet (20 mg total) by mouth every morning., Disp: 30 tablet, Rfl: 4 .  fluconazole (DIFLUCAN) 100 MG tablet, Take 1  tablet (100 mg total) by mouth daily., Disp: 30 tablet, Rfl: 4 .  lisinopril (PRINIVIL,ZESTRIL) 40 MG tablet, Take 1 tablet (40 mg total) by mouth every morning., Disp: 30 tablet, Rfl: 4 .  LORazepam (ATIVAN) 0.5 MG tablet, Take 1 tablet (0.5 mg total) by mouth every 6 (six) hours as needed (Nausea or vomiting)., Disp: 30 tablet, Rfl: 0 .  Multiple Vitamin (MULTIVITAMIN WITH MINERALS) TABS tablet, Take 1 tablet by mouth daily., Disp: , Rfl:  .  ondansetron (ZOFRAN) 8 MG tablet, Take 1 tablet (8 mg total) by mouth 2 (two) times daily. Start the day after chemo for 3 days. Then take as needed for nausea or vomiting., Disp: 30 tablet, Rfl: 1 .  oxyCODONE (OXY IR/ROXICODONE) 5 MG immediate release tablet, Take 1-3 tablets (5-15 mg total) by mouth every 6 (six) hours as needed for severe pain. Take 1-2 if needed for pain due to cancer., Disp: 240 tablet, Rfl: 0 .  pantoprazole (PROTONIX) 40 MG tablet, Take 1 tablet (40 mg total) by mouth 2 (two) times daily., Disp: 60 tablet, Rfl: 4 .  potassium chloride SA (K-DUR,KLOR-CON) 20 MEQ tablet, Take 1 tablet (20 mEq total) by mouth daily., Disp: 30 tablet, Rfl: 3 .  prochlorperazine (COMPAZINE) 10 MG tablet, Take 1 tablet (10 mg total) by mouth every 6 (six) hours as needed for nausea or vomiting., Disp: 120 tablet, Rfl: 3 .  pyridOXINE (VITAMIN  B-6) 100 MG tablet, Take 2 tablets (200 mg total) by mouth daily., Disp: 60 tablet, Rfl: 11 .  sucralfate (CARAFATE) 1 GM/10ML suspension, Take 10 mLs (1 g total) by mouth 4 (four) times daily -  with meals and at bedtime., Disp: 420 mL, Rfl: 3 .  SUMAtriptan (IMITREX) 100 MG tablet, Take 1 tablet (100 mg total) by mouth every 2 (two) hours as needed for migraine., Disp: 10 tablet, Rfl: 3  Allergies:  Allergies  Allergen Reactions  . Hydrocodone     "makes me crawl on the floor"  . Aspirin Other (See Comments)    Due to ulcers  . Penicillins Swelling    Past Medical History, Surgical history, Social history, and  Family History were reviewed and updated.  Review of Systems: As above  Physical Exam:  height is 5' 2"  (1.575 m) and weight is 158 lb (71.668 kg). Her oral temperature is 98.6 F (37 C). Her blood pressure is 120/78 and her pulse is 105. Her respiration is 16.   Fairly well-built and well-nourished African-American female. She has no ocular or oral lesions. There is no palpable cervical or supraclavicular lymph nodes. Lungs are clear. Cardiac exam regular rate and rhythm with no murmurs, rubs or bruits. Breast exam shows some moderate swelling of the right breast. She still has some firmness about the areola. She does have some palpable right axillary lymph nodes. She has a dressing over her wound site in the right axilla. Left breast is unremarkable remarkable. Abdomen is soft. She has good bowel sounds. There is no fluid wave. There is no palpable liver or spleen tip. Extremity shows no clubbing, cyanosis or edema. Neurological exam shows no focal neurological deficits. Skin exam shows no rashes, ecchymoses or petechia.  Lab Results  Component Value Date   WBC 21.4* 07/15/2015   HGB 9.1* 07/15/2015   HCT 26.8* 07/15/2015   MCV 109* 07/15/2015   PLT 50* 07/15/2015     Chemistry      Component Value Date/Time   NA 138 07/15/2015 0952   NA 137 04/09/2014 0450   K 3.1* 07/15/2015 0952   K 3.5* 04/09/2014 0450   CL 107 07/15/2015 0952   CL 101 04/09/2014 0450   CO2 25 07/15/2015 0952   CO2 23 04/09/2014 0450   BUN 17 07/15/2015 0952   BUN 5* 04/09/2014 0450   CREATININE 0.9 07/15/2015 0952   CREATININE 0.59 04/09/2014 0450      Component Value Date/Time   CALCIUM 9.2 07/15/2015 0952   CALCIUM 9.3 04/09/2014 0450   ALKPHOS 185* 07/15/2015 0952   ALKPHOS 179* 04/07/2014 0630   AST 150* 07/15/2015 0952   AST 74* 04/07/2014 0630   ALT 126* 07/15/2015 0952   ALT 34 04/07/2014 0630   BILITOT 0.70 07/15/2015 0952   BILITOT 0.7 04/07/2014 0630         Impression and  Plan: Mary [name] is 59 year old African American female. She is postmenopausal. She has a triple positive metastatic breast cancer.   We will have to hold her chemotherapy this week. I do think her blood count is just too low to treat her.  Her last CT scans were done back in May. I think we probably will get another set of scans after this cycle.  Again, since she is triple positive,, I wonder if we might be able to get away with just anti-HER-2 therapy at some point for "maintenance"therapy.  We will have her come back in one week  for follow-up. If her labs are okay, we will proceed with therapy.  Volanda Napoleon, MD 7/18/20163:30 PM

## 2015-07-16 ENCOUNTER — Encounter: Payer: Self-pay | Admitting: Pharmacist

## 2015-07-16 LAB — CANCER ANTIGEN 27.29: CA 27.29: 73 U/mL — AB (ref 0–39)

## 2015-07-22 ENCOUNTER — Telehealth: Payer: Self-pay | Admitting: *Deleted

## 2015-07-22 NOTE — Telephone Encounter (Signed)
Patient has an appointment scheduled tomorrow. She wants to reschedule her appointment. Patient moved to Thursday instead.

## 2015-07-23 ENCOUNTER — Other Ambulatory Visit: Payer: Medicaid Other

## 2015-07-23 ENCOUNTER — Ambulatory Visit: Payer: Medicaid Other | Admitting: Family

## 2015-07-23 ENCOUNTER — Ambulatory Visit: Payer: Medicaid Other

## 2015-07-25 ENCOUNTER — Ambulatory Visit: Payer: Medicaid Other

## 2015-07-25 ENCOUNTER — Other Ambulatory Visit (HOSPITAL_BASED_OUTPATIENT_CLINIC_OR_DEPARTMENT_OTHER): Payer: Medicaid Other

## 2015-07-25 ENCOUNTER — Ambulatory Visit (HOSPITAL_BASED_OUTPATIENT_CLINIC_OR_DEPARTMENT_OTHER)
Admission: RE | Admit: 2015-07-25 | Discharge: 2015-07-25 | Disposition: A | Discharge status: Home / Self Care | Payer: Medicaid Other | Source: Ambulatory Visit | Attending: Family | Admitting: Family

## 2015-07-25 ENCOUNTER — Encounter (HOSPITAL_BASED_OUTPATIENT_CLINIC_OR_DEPARTMENT_OTHER): Payer: Self-pay

## 2015-07-25 ENCOUNTER — Encounter: Payer: Self-pay | Admitting: Family

## 2015-07-25 ENCOUNTER — Ambulatory Visit (HOSPITAL_BASED_OUTPATIENT_CLINIC_OR_DEPARTMENT_OTHER): Payer: Medicaid Other | Admitting: Family

## 2015-07-25 VITALS — BP 138/85 | HR 86 | Temp 99.0°F | Ht 62.0 in | Wt 157.0 lb

## 2015-07-25 DIAGNOSIS — K047 Periapical abscess without sinus: Secondary | ICD-10-CM | POA: Diagnosis not present

## 2015-07-25 DIAGNOSIS — C787 Secondary malignant neoplasm of liver and intrahepatic bile duct: Secondary | ICD-10-CM

## 2015-07-25 DIAGNOSIS — K1379 Other lesions of oral mucosa: Secondary | ICD-10-CM

## 2015-07-25 DIAGNOSIS — R42 Dizziness and giddiness: Secondary | ICD-10-CM

## 2015-07-25 DIAGNOSIS — C50911 Malignant neoplasm of unspecified site of right female breast: Secondary | ICD-10-CM

## 2015-07-25 DIAGNOSIS — R220 Localized swelling, mass and lump, head: Secondary | ICD-10-CM | POA: Diagnosis present

## 2015-07-25 DIAGNOSIS — R070 Pain in throat: Secondary | ICD-10-CM | POA: Diagnosis not present

## 2015-07-25 DIAGNOSIS — R51 Headache: Secondary | ICD-10-CM | POA: Diagnosis not present

## 2015-07-25 DIAGNOSIS — J320 Chronic maxillary sinusitis: Secondary | ICD-10-CM | POA: Diagnosis not present

## 2015-07-25 DIAGNOSIS — R0602 Shortness of breath: Secondary | ICD-10-CM

## 2015-07-25 DIAGNOSIS — C7931 Secondary malignant neoplasm of brain: Secondary | ICD-10-CM

## 2015-07-25 LAB — CBC WITH DIFFERENTIAL (CANCER CENTER ONLY)
BASO#: 0 10*3/uL (ref 0.0–0.2)
BASO%: 0.1 % (ref 0.0–2.0)
EOS%: 0.1 % (ref 0.0–7.0)
Eosinophils Absolute: 0 10*3/uL (ref 0.0–0.5)
HEMATOCRIT: 29.7 % — AB (ref 34.8–46.6)
HEMOGLOBIN: 9.9 g/dL — AB (ref 11.6–15.9)
LYMPH#: 0.9 10*3/uL (ref 0.9–3.3)
LYMPH%: 6.5 % — AB (ref 14.0–48.0)
MCH: 37.1 pg — ABNORMAL HIGH (ref 26.0–34.0)
MCHC: 33.3 g/dL (ref 32.0–36.0)
MCV: 111 fL — AB (ref 81–101)
MONO#: 1.5 10*3/uL — AB (ref 0.1–0.9)
MONO%: 10.7 % (ref 0.0–13.0)
NEUT#: 11.2 10*3/uL — ABNORMAL HIGH (ref 1.5–6.5)
NEUT%: 82.6 % — AB (ref 39.6–80.0)
Platelets: 339 10*3/uL (ref 145–400)
RBC: 2.67 10*6/uL — AB (ref 3.70–5.32)
RDW: 17.6 % — ABNORMAL HIGH (ref 11.1–15.7)
WBC: 13.6 10*3/uL — ABNORMAL HIGH (ref 3.9–10.0)

## 2015-07-25 LAB — CMP (CANCER CENTER ONLY)
ALT(SGPT): 178 U/L — ABNORMAL HIGH (ref 10–47)
AST: 136 U/L — ABNORMAL HIGH (ref 11–38)
Albumin: 3.3 g/dL (ref 3.3–5.5)
Alkaline Phosphatase: 168 U/L — ABNORMAL HIGH (ref 26–84)
BUN, Bld: 20 mg/dL (ref 7–22)
CO2: 21 mEq/L (ref 18–33)
Calcium: 9.1 mg/dL (ref 8.0–10.3)
Chloride: 109 mEq/L — ABNORMAL HIGH (ref 98–108)
Creat: 0.9 mg/dl (ref 0.6–1.2)
Glucose, Bld: 167 mg/dL — ABNORMAL HIGH (ref 73–118)
POTASSIUM: 3.7 meq/L (ref 3.3–4.7)
SODIUM: 137 meq/L (ref 128–145)
TOTAL PROTEIN: 6.6 g/dL (ref 6.4–8.1)
Total Bilirubin: 0.6 mg/dl (ref 0.20–1.60)

## 2015-07-25 MED ORDER — IOHEXOL 300 MG/ML  SOLN
75.0000 mL | Freq: Once | INTRAMUSCULAR | Status: AC | PRN
  Administered 2015-07-25: 75 mL via INTRAVENOUS

## 2015-07-25 NOTE — Progress Notes (Signed)
Hematology and Oncology Follow Up Visit  Mary [name] [id number] May 25, 1956 59 y.o. 07/25/2015   Principle Diagnosis:  Metastatic breast cancer- Triple positive  Current Therapy:   Herceptin/Perjeta/Carboplatin/Gemcitabine q 21 days s/p cycle 6 Zometa 4 mg IV Q3 weeks    Interim History: Ms. Rondeau is here today for a follow-up and treatment. She has developed an abscess in the left maxillary side. Ct scan showed: Dental disease with multiple upper and lower tooth periapical abscesses. A left upper molar periapical abscess extending into the inferior left maxillary sinus with associated chronic maxillary sinus mucosal thickening compatible with chronic sinusitis. No soft tissue abscess seen. We will have her follow-up with Dr. Dorothyann Gibbs on Monday. His office has already called her and made an appointment.  The left side of her throat is sore but she has no problems swallowing.  She denies fever, chills, n/v, rash, cough, chest pain, palpitations, abdominal pain, no changes in her bowel or bladder habits. No bloos in her urine or stool.  She has some SOB with exertion. She is still having headaches and some dizziness. No recent falls or syncopal episodes.  She denies swelling or tenderness in her extremities. 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  Her appetite is improved and she is staying hydrated. Her weight is up 8 lbs since her last visit.  Earlier this month her CA 27.29 was 21.  Her MUGA scan in April showed a slight decrease in her EF to 56%.   Medications:    Medication List       This list is accurate as of: 07/25/15 10:09 AM.  Always use your most recent med list.               acidophilus Caps capsule  Take 1 capsule by mouth daily.     amLODipine 5 MG tablet  Commonly known as:  NORVASC  Take 1 tablet (5 mg total) by mouth every morning.     aspirin-acetaminophen-caffeine 825-053-97 MG per tablet  Commonly known as:  EXCEDRIN MIGRAINE  Take by  mouth every 6 (six) hours as needed for headache.     dexamethasone 4 MG tablet  Commonly known as:  DECADRON  Take 2 tablets (8 mg total) by mouth 2 (two) times daily.     dronabinol 2.5 MG capsule  Commonly known as:  MARINOL  Take 1 capsule (2.5 mg total) by mouth 2 (two) times daily before a meal.     DULoxetine 30 MG capsule  Commonly known as:  CYMBALTA  Take 1 capsule (30 mg total) by mouth daily.     escitalopram 20 MG tablet  Commonly known as:  LEXAPRO  Take 1 tablet (20 mg total) by mouth every morning.     fluconazole 100 MG tablet  Commonly known as:  DIFLUCAN  Take 1 tablet (100 mg total) by mouth daily.     lisinopril 40 MG tablet  Commonly known as:  PRINIVIL,ZESTRIL  Take 1 tablet (40 mg total) by mouth every morning.     LORazepam 0.5 MG tablet  Commonly known as:  ATIVAN  Take 1 tablet (0.5 mg total) by mouth every 6 (six) hours as needed (Nausea or vomiting).     multivitamin with minerals Tabs tablet  Take 1 tablet by mouth daily.     ondansetron 8 MG tablet  Commonly known as:  ZOFRAN  Take 1 tablet (8 mg total) by mouth 2 (two) times daily. Start the day after chemo  for 3 days. Then take as needed for nausea or vomiting.     oxyCODONE 5 MG immediate release tablet  Commonly known as:  Oxy IR/ROXICODONE  Take 1-3 tablets (5-15 mg total) by mouth every 6 (six) hours as needed for severe pain. Take 1-2 if needed for pain due to cancer.     pantoprazole 40 MG tablet  Commonly known as:  PROTONIX  Take 1 tablet (40 mg total) by mouth 2 (two) times daily.     potassium chloride SA 20 MEQ tablet  Commonly known as:  K-DUR,KLOR-CON  Take 1 tablet (20 mEq total) by mouth daily.     prochlorperazine 10 MG tablet  Commonly known as:  COMPAZINE  Take 1 tablet (10 mg total) by mouth every 6 (six) hours as needed for nausea or vomiting.     pyridOXINE 100 MG tablet  Commonly known as:  VITAMIN B-6  Take 2 tablets (200 mg total) by mouth daily.      sucralfate 1 GM/10ML suspension  Commonly known as:  CARAFATE  Take 10 mLs (1 g total) by mouth 4 (four) times daily -  with meals and at bedtime.     SUMAtriptan 100 MG tablet  Commonly known as:  IMITREX  Take 1 tablet (100 mg total) by mouth every 2 (two) hours as needed for migraine.     VITAMIN B-12 PO  Take by mouth.        Allergies:  Allergies  Allergen Reactions  . Hydrocodone     "makes me crawl on the floor"  . Aspirin Other (See Comments)    Due to ulcers  . Penicillins Swelling    Past Medical History, Surgical history, Social history, and Family History were reviewed and updated.  Review of Systems: All other 10 point review of systems is negative.   Physical Exam:  vitals were not taken for this visit.  Wt Readings from Last 3 Encounters:  07/15/15 158 lb (71.668 kg)  06/19/15 150 lb (68.04 kg)  06/03/15 145 lb 6.4 oz (65.953 kg)    Ocular: Sclerae unicteric, pupils equal, round and reactive to light Ear-nose-throat: Oropharynx clear, dentition fair Lymphatic: No cervical or supraclavicular adenopathy Lungs no rales or rhonchi, good excursion bilaterally Heart regular rate and rhythm, no murmur appreciated Abd soft, nontender, positive bowel sounds MSK no focal spinal tenderness, no joint edema Neuro: non-focal, well-oriented, appropriate affect Breasts: The wound to he right breast continues to heal. There is a foul odor but not painful to touch. No new mass, lesion, rash or lymphadenopathy.   Lab Results  Component Value Date   WBC 21.4* 07/15/2015   HGB 9.1* 07/15/2015   HCT 26.8* 07/15/2015   MCV 109* 07/15/2015   PLT 50* 07/15/2015   Lab Results  Component Value Date   FERRITIN 151 02/21/2015   IRON 47 02/21/2015   TIBC 398 02/21/2015   UIBC 351 02/21/2015   IRONPCTSAT 12* 02/21/2015   Lab Results  Component Value Date   RBC 2.45* 07/15/2015   No results found for: KPAFRELGTCHN, LAMBDASER, KAPLAMBRATIO No results found for:  IGGSERUM, IGA, IGMSERUM No results found for: Ronnald Ramp, A1GS, A2GS, Violet Baldy MSPIKE, SPEI   Chemistry      Component Value Date/Time   NA 138 07/15/2015 0952   NA 137 04/09/2014 0450   K 3.1* 07/15/2015 0952   K 3.5* 04/09/2014 0450   CL 107 07/15/2015 0952   CL 101 04/09/2014 0450   CO2 25  07/15/2015 0952   CO2 23 04/09/2014 0450   BUN 17 07/15/2015 0952   BUN 5* 04/09/2014 0450   CREATININE 0.9 07/15/2015 0952   CREATININE 0.59 04/09/2014 0450      Component Value Date/Time   CALCIUM 9.2 07/15/2015 0952   CALCIUM 9.3 04/09/2014 0450   ALKPHOS 185* 07/15/2015 0952   ALKPHOS 179* 04/07/2014 0630   AST 150* 07/15/2015 0952   AST 74* 04/07/2014 0630   ALT 126* 07/15/2015 0952   ALT 34 04/07/2014 0630   BILITOT 0.70 07/15/2015 0952   BILITOT 0.7 04/07/2014 0630     Impression and Plan: Ms. Rowlands is 59 year old Afro-American female with metastatic breast cancer. She c/o pain in the roof of her mouth and left side of her throat. She has multiple periapical abscesses. She now has an appointment to follow-up with Dr. Dorothyann Gibbs on Monday.  We will hold off on treating her today. Her WBC count is 13.6 and Hgb 9.9. She has had no fevers.  She has an appointment with [name] on August 8th. We will keep that.  She knows to call here with any questions or concerns and to go to the ED in the event of an emergency. We can certainly see her again sooner if needed.   Eliezer Bottom, NP 7/28/201610:09 AM

## 2015-07-29 ENCOUNTER — Ambulatory Visit (HOSPITAL_COMMUNITY): Payer: Self-pay | Admitting: Dentistry

## 2015-07-29 ENCOUNTER — Other Ambulatory Visit: Payer: Self-pay | Admitting: Radiation Therapy

## 2015-07-29 ENCOUNTER — Encounter: Payer: Self-pay | Admitting: Radiation Therapy

## 2015-07-29 ENCOUNTER — Encounter (HOSPITAL_COMMUNITY): Payer: Self-pay | Admitting: Dentistry

## 2015-07-29 DIAGNOSIS — M264 Malocclusion, unspecified: Secondary | ICD-10-CM

## 2015-07-29 DIAGNOSIS — M270 Developmental disorders of jaws: Secondary | ICD-10-CM

## 2015-07-29 DIAGNOSIS — K0889 Other specified disorders of teeth and supporting structures: Secondary | ICD-10-CM

## 2015-07-29 DIAGNOSIS — K08409 Partial loss of teeth, unspecified cause, unspecified class: Secondary | ICD-10-CM

## 2015-07-29 DIAGNOSIS — K029 Dental caries, unspecified: Secondary | ICD-10-CM

## 2015-07-29 DIAGNOSIS — C50919 Malignant neoplasm of unspecified site of unspecified female breast: Secondary | ICD-10-CM

## 2015-07-29 DIAGNOSIS — IMO0002 Reserved for concepts with insufficient information to code with codable children: Secondary | ICD-10-CM

## 2015-07-29 DIAGNOSIS — K045 Chronic apical periodontitis: Secondary | ICD-10-CM

## 2015-07-29 DIAGNOSIS — M898X0 Other specified disorders of bone, multiple sites: Secondary | ICD-10-CM

## 2015-07-29 DIAGNOSIS — K0530 Chronic periodontitis, unspecified: Secondary | ICD-10-CM

## 2015-07-29 DIAGNOSIS — M87180 Osteonecrosis due to drugs, jaw: Secondary | ICD-10-CM

## 2015-07-29 DIAGNOSIS — C7931 Secondary malignant neoplasm of brain: Secondary | ICD-10-CM

## 2015-07-29 DIAGNOSIS — K0401 Reversible pulpitis: Secondary | ICD-10-CM

## 2015-07-29 MED ORDER — CHLORHEXIDINE GLUCONATE 0.12 % MT SOLN: OROMUCOSAL | Status: DC

## 2015-07-29 NOTE — Progress Notes (Signed)
DENTAL CONSULTATION  Date of Consultation:  07/29/2015 Patient Name:   Mary [name] Date of Birth:   02/14/1956 Medical Record Number: [id number]  VITALS: BP 138/72 mmHg  Pulse 69  Temp(Src) 98.6 F (37 C) (Oral)  CHIEF COMPLAINT: Patient referred by [name] for dental consultation.  HPI: Mary [name] is a 59 year old female with breast cancer with multiple metastases. Patient currently and going active chemotherapy with [name]. Patient has also had 14 previous Zometa doses. Patient is now seen to evaluate upper left quadrant for pathology and history of maxillary left periapical abscess and to discuss risks for drug-induced osteonecrosis related to the previous IV bisphosphonate therapy.  The patient currently is complaining that her "mouth is sore". Patient indicates that it primarily hurts in the upper left quadrant and mid palate portion of the mouth. Patient also is complaining that "it hurts when I chew on the lower right side ". Patient describes having a dull, achy pain that lasts for several minutes at a time. Patient indicates that when it hurts it reaches an intensity of 8-1/2 out of 10. Patient indicates it is currently 5 out of 10 in intensity. Patient indicates that she has been using oxycodone that does help the pain. The patient indicates that she really "does not have a toothache" at this time.  The patient has no primary dentist and has not seen a dentist for 10 years. This was in [place]. Patient had several teeth pulled at that time with no complications by report. Patient denies having any partial dentures.    PROBLEM LIST: Patient Active Problem List   Diagnosis Date Noted  . Breast cancer metastasized to multiple sites 04/12/2014    Priority: Medium  . over 75 brain metastases, several showing hemorrhage 03/04/2015  . Wound of right breast 08/06/2014  . Protein-calorie malnutrition, severe 04/06/2014  . Fever, unspecified 04/05/2014  . Right hip  pain 04/05/2014  . Abdominal tenderness, generalized 04/05/2014  . Back pain 04/05/2014  . Cellulitis 04/05/2014  . Migraine headache 04/05/2014  . Elevated liver enzymes 04/05/2014  . Sepsis 04/05/2014  . Preventative health care 07/04/2013  . Anxiety state, unspecified 07/04/2013  . Arthritis   . Depression   . Hypertension   . Migraines     PMH: Past Medical History  Diagnosis Date  . Arthritis   . Depression   . Fainting   . Headache   . Hypertension   . Migraines   . History of blood transfusion   . Gastric ulcer   . Breast cancer metastasized to multiple sites 04/12/2014    PSH: Past Surgical History  Procedure Laterality Date  . Abdominal hysterectomy  15 years go    partial    ALLERGIES: Allergies  Allergen Reactions  . Hydrocodone     "makes me crawl on the floor"  . Aspirin Other (See Comments)    Due to ulcers  . Penicillins Swelling    MEDICATIONS: Current Outpatient Prescriptions  Medication Sig Dispense Refill  . acidophilus (RISAQUAD) CAPS capsule Take 1 capsule by mouth daily.    Marland Kitchen amLODipine (NORVASC) 5 MG tablet Take 1 tablet (5 mg total) by mouth every morning. 30 tablet 4  . aspirin-acetaminophen-caffeine (EXCEDRIN MIGRAINE) 144-315-40 MG per tablet Take by mouth every 6 (six) hours as needed for headache.    . Cyanocobalamin (VITAMIN B-12 PO) Take by mouth.    . dexamethasone (DECADRON) 4 MG tablet Take 2 tablets (8 mg total) by mouth 2 (two) times  daily. 180 tablet 4  . dronabinol (MARINOL) 2.5 MG capsule Take 1 capsule (2.5 mg total) by mouth 2 (two) times daily before a meal. 60 capsule 3  . DULoxetine (CYMBALTA) 30 MG capsule Take 1 capsule (30 mg total) by mouth daily. 30 capsule 3  . escitalopram (LEXAPRO) 20 MG tablet Take 1 tablet (20 mg total) by mouth every morning. 30 tablet 4  . fluconazole (DIFLUCAN) 100 MG tablet Take 1 tablet (100 mg total) by mouth daily. 30 tablet 4  . lisinopril (PRINIVIL,ZESTRIL) 40 MG tablet Take 1  tablet (40 mg total) by mouth every morning. 30 tablet 4  . LORazepam (ATIVAN) 0.5 MG tablet Take 1 tablet (0.5 mg total) by mouth every 6 (six) hours as needed (Nausea or vomiting). 30 tablet 0  . Multiple Vitamin (MULTIVITAMIN WITH MINERALS) TABS tablet Take 1 tablet by mouth daily.    . ondansetron (ZOFRAN) 8 MG tablet Take 1 tablet (8 mg total) by mouth 2 (two) times daily. Start the day after chemo for 3 days. Then take as needed for nausea or vomiting. 30 tablet 1  . oxyCODONE (OXY IR/ROXICODONE) 5 MG immediate release tablet Take 1-3 tablets (5-15 mg total) by mouth every 6 (six) hours as needed for severe pain. Take 1-2 if needed for pain due to cancer. 240 tablet 0  . pantoprazole (PROTONIX) 40 MG tablet Take 1 tablet (40 mg total) by mouth 2 (two) times daily. 60 tablet 4  . potassium chloride SA (K-DUR,KLOR-CON) 20 MEQ tablet Take 1 tablet (20 mEq total) by mouth daily. 30 tablet 3  . prochlorperazine (COMPAZINE) 10 MG tablet Take 1 tablet (10 mg total) by mouth every 6 (six) hours as needed for nausea or vomiting. 120 tablet 3  . pyridOXINE (VITAMIN B-6) 100 MG tablet Take 2 tablets (200 mg total) by mouth daily. 60 tablet 11  . sucralfate (CARAFATE) 1 GM/10ML suspension Take 10 mLs (1 g total) by mouth 4 (four) times daily -  with meals and at bedtime. 420 mL 3  . SUMAtriptan (IMITREX) 100 MG tablet Take 1 tablet (100 mg total) by mouth every 2 (two) hours as needed for migraine. 10 tablet 3   No current facility-administered medications for this visit.    LABS: Lab Results  Component Value Date   WBC 13.6* 07/25/2015   HGB 9.9* 07/25/2015   HCT 29.7* 07/25/2015   MCV 111* 07/25/2015   PLT 339 07/25/2015      Component Value Date/Time   NA 137 07/25/2015 1002   NA 137 04/09/2014 0450   K 3.7 07/25/2015 1002   K 3.5* 04/09/2014 0450   CL 109* 07/25/2015 1002   CL 101 04/09/2014 0450   CO2 21 07/25/2015 1002   CO2 23 04/09/2014 0450   GLUCOSE 167* 07/25/2015 1002    GLUCOSE 98 04/09/2014 0450   BUN 20 07/25/2015 1002   BUN 5* 04/09/2014 0450   CREATININE 0.9 07/25/2015 1002   CREATININE 0.59 04/09/2014 0450   CALCIUM 9.1 07/25/2015 1002   CALCIUM 9.3 04/09/2014 0450   GFRNONAA >90 04/09/2014 0450   GFRAA >90 04/09/2014 0450   Lab Results  Component Value Date   INR 1.23 04/20/2014   INR 1.26 04/09/2014   No results found for: PTT  SOCIAL HISTORY: History   Social History  . Marital Status: Married    Spouse Name: N/A  . Number of Children: N/A  . Years of Education: N/A   Occupational History  .  no work for 4 to 5 years   Social History Main Topics  . Smoking status: Never Smoker   . Smokeless tobacco: Never Used     Comment: never used tobacco  . Alcohol Use: No  . Drug Use: No  . Sexual Activity: Not Currently   Other Topics Concern  . Not on file   Social History Narrative    FAMILY HISTORY: Family History  Problem Relation Age of Onset  . Arthritis Other   . Hypertension Mother   . Hypertension Other   . Heart disease Other   . Stroke Father   . Cancer Sister     leukemia    REVIEW OF SYSTEMS: Reviewed with the patient and is included in dental record.  DENTAL HISTORY: CHIEF COMPLAINT: Patient referred by [name] for dental consultation.  HPI: ANJALEE COPE is a 59 year old female with breast cancer with multiple metastases. Patient currently and going active chemotherapy with [name]. Patient has also had 14 previous Zometa doses. Patient is now seen to evaluate upper left quadrant for pathology and history of maxillary left periapical abscess and to discuss risks for drug-induced osteonecrosis related to the previous IV bisphosphonate therapy.  The patient currently is complaining that her "mouth is sore". Patient indicates that it primarily hurts in the upper left quadrant and mid palate portion of the mouth. Patient also is complaining that "it hurts when I chew on the lower right side ".  Patient describes having a dull, achy pain that lasts for several minutes at a time. Patient indicates that when it hurts it reaches an intensity of 8-1/2 out of 10. Patient indicates it is currently 5 out of 10 in intensity. Patient indicates that she has been using oxycodone that does help the pain. The patient indicates that she really "does not have a toothache" at this time.  The patient has no primary dentist and has not seen a dentist for 10 years. This was in [place]. Patient had several teeth pulled at that time with no complications by report. Patient denies having any partial dentures.   DENTAL EXAMINATION: GENERAL: The patient is well-developed, well-nourished female in no acute distress. HEAD AND NECK: There is no palpable submandibular lymphadenopathy. The patient denies acute TMJ symptoms. INTRAORAL EXAM: The patient has normal saliva. Patient has a large mid palatal torus with evidence of previous trauma. The patient has maxillary left and right palatal exostoses. There is an 8 x 14 mm area of exposed bone involving the exostosis. 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 DENTITION: Patient is missing tooth numbers 1, 2, 16, 17, 31, and 32. PERIODONTAL: Patient has chronic, advanced periodontal disease with plaque and calculus accumulations, generalized gingival recession, and generalized tooth mobility. There is moderate to severe bone loss. DENTAL CARIES/SUBOPTIMAL RESTORATIONS: Dental caries are noted. ENDODONTIC: Patient with a history of dull, achy pain associated with the upper left and lower right quadrants. There is periapical pathology associated with tooth numbers 14, 29, and 30. CROWN AND BRIDGE: There are no crown or bridge restorations. PROSTHODONTIC: Patient denies having any partial dentures. OCCLUSION: The patient has a poor occlusal scheme  secondary to multiple missing teeth, multiple malpositioned teeth, maxillary anterior end to end occlusion, and lack of replacement of the missing teeth with dental prostheses.  RADIOGRAPHIC INTERPRETATION: An orthopantogram was taken today and supplemented with 10 periapical radiographs. I was  unable to obtain full series of dental radiographs due to patient discomfort and exostoses. There are multiple missing teeth. There are multiple areas of periapical pathology and radiolucency associated with tooth numbers 14, 29, and 30. Dental caries are noted. There is moderate to severe bone loss. There are maxillary left and right radiopacities consistent with bilateral maxillary palatal exostoses. There is a mid palate radiopacity consistent with the large palatal torus. The maxillary sinuses are noted and there is an opacity associated with the maxillary left sinus.  Please see CT scan results listed below 07/25/2015. CLINICAL DATA: Left maxillary facial region mass. White spots in the roof of her mouth. Tooth pain. Metastatic right breast cancer. Last chemotherapy 2 weeks ago.  EXAM: CT MAXILLOFACIAL WITH CONTRAST  TECHNIQUE: Multidetector CT imaging of the maxillofacial structures was performed with intravenous contrast. Multiplanar CT image reconstructions were also generated. A small metallic BB was placed on the right temple in order to reliably differentiate right from left.  CONTRAST: 46mL OMNIPAQUE IOHEXOL 300 MG/ML SOLN  COMPARISON: Previous examinations, including the maxillofacial CT without contrast dated 02/19/2015.  FINDINGS: Again demonstrated are multiple upper and lower teeth with periapical lucencies. The 1 remaining right lower molar contains multiple cavities. The periapical lucency involving the first left upper molar has small extensions through the floor of the maxillary sinus with left maxillary sinus mucosal thickening again demonstrated. No air-fluid  levels are seen. No soft tissue fluid collections or air seen. Cervical spine degenerative changes are noted.  IMPRESSION: 1. Dental disease with multiple upper and lower tooth periapical abscesses. A left upper molar periapical abscess extending into the inferior left maxillary sinus with associated chronic maxillary sinus mucosal thickening compatible with chronic sinusitis. 2. No soft tissue abscess seen.   Electronically Signed  By: Claudie Revering M.D.  On: 07/25/2015 12:26  ASSESSMENTS: 1. History of breast cancer with multiple metastases 2. Active chemotherapy with [name] 3. History of 12 previous doses of IV Zometa 4. Risk for drug-induced osteonecrosis of the jaw 5. History of acute pulpitis 6. Chronic apical periodontitis 7. Dental caries 8. Large mid palatal maxillary torus 9. Bilateral mandibular lateral exostoses 10. Exposed bone of the maxillary left lateral exostosis measuring 8 x 14 mm 11. Chronic periodontitis with bone loss 12. Generalized gingival recession 13. Accretions 14. Tooth mobility 15. Multiple missing teeth  16. Multiple malpositioned teeth 15. Supra-eruption and drifting of the unopposed teeth into the edentulous areas 17. Malocclusion 18. History of thrombocytopenia and anemia with the chemotherapy 19. Opacified maxillary left sinus consistent with chronic sinusitis 20. Risk for bleeding with invasive dental procedures due to history of thrombocytopenia with chemotherapy.  PLAN/RECOMMENDATIONS: 1. I discussed the risks, benefits, and complications of various treatment options with the patient in relationship to her medical and dental conditions, current chemotherapy, history of IV Zometa/bisphosphonate therapy, exposed bone involving the maxillary left exostosis, and drug-induced osteonecrosis of the jaw. We discussed various treatment options to include no treatment, multiple extractions with alveoloplasty, pre-prosthetic surgery as  indicated, periodontal therapy, dental restorations, root canal therapy, crown and bridge therapy, implant therapy, and replacement of missing teeth as indicated. We also discussed referral to an oral surgeon and endodontist due to the complexity of the dental treatment.  The patient currently wishes to proceed with referral to an oral surgeon for evaluation and discussion of possible surgical options at this time. The patient has been scheduled to see Dr. Frederik Schmidt for this Thursday at 3:15 PM. However, the patient may need  to be referred to the [facility] for evaluation and treatment due to the complexity of this case. Patient may also benefit from referral to an endodontist for possible root canal therapy instead of dental extractions. Patient may also need to be referred to [facility] for the endodontic therapy. I have currently prescribed chlorhexidine gluconate rinses. Patient is to rinse with 15 ML's 3 times daily in a swish and spit manner. Patient has been provided with PRN refills. Patient may also benefit from a prescription for antibiotics for the possible sinus infection and/or drug-induced osteonecrosis of the jaw. I will defer this prescription until patient has been evaluated by Dr. Benson Norway.  2. Discussion of findings with medical team and coordination of future medical and dental care as needed.  I spent in excess of 120 minutes during the conduct of this consultation and >50% of this time involved direct face-to-face encounter for counseling and/or coordination of the patient's care.    Lenn Cal, DDS

## 2015-07-29 NOTE — Patient Instructions (Signed)
Patient is scheduled for oral surgery consultation this Thursday, 08/01/2059 at 3:15 PM with Dr. Benson Norway. Patient is to start using chlorhexidine rinses 3 times daily as prescribed. Will consider additional antibiotic therapy after consultation with Dr. Benson Norway. Dr. Enrique Sack

## 2015-07-30 ENCOUNTER — Ambulatory Visit: Payer: Medicaid Other

## 2015-07-30 ENCOUNTER — Other Ambulatory Visit: Payer: Medicaid Other

## 2015-07-31 ENCOUNTER — Encounter: Payer: Self-pay | Admitting: Radiation Therapy

## 2015-07-31 ENCOUNTER — Other Ambulatory Visit: Payer: Self-pay | Admitting: Radiation Therapy

## 2015-07-31 DIAGNOSIS — C7931 Secondary malignant neoplasm of brain: Secondary | ICD-10-CM

## 2015-07-31 NOTE — Progress Notes (Signed)
1.  Do you need a wheel chair?    no  2. On oxygen?  no  3. Have you ever had any surgery in the body part being scanned? no  4. Have you ever had any surgery on your brain or heart?                              no                 5. Have you ever had surgery on your eyes or ears?    no                      6. Do you have a pacemaker or defibrillator?   no  7. Do you have a Neurostimulator?       no  8. Claustrophobic?  Yes but closes eyes and is ok without medication   9. Any risk for metal in eyes?no  10. Injury by bullet, buckshot, or shrapnel?   no  11. Stent?     no                                                                                                                     12. Hx of Cancer?    Yes Breast Cancer with mets to the brain      13. Kidney or Liver disease?  no  14. Hx of Lupus, Rheumatoid Arthritis or Scleroderma? no  15. IV Antibiotics or long term use of NSAIDS? no  16. HX of Hypertension?  yes  17. Diabetes?  no  18. Allergy to contrast?  no  19. Recent labs. Drawn 7/28.     Pt is complaining of some dizziness and headache at the time these answers were confirmed on 8/3.   Mont Dutton R.T. (R) (T) [facility] [place] [phone] Office [phone] Pager [phone] Fax [email or web address]

## 2015-08-02 ENCOUNTER — Ambulatory Visit (HOSPITAL_BASED_OUTPATIENT_CLINIC_OR_DEPARTMENT_OTHER): Payer: Medicaid Other

## 2015-08-02 ENCOUNTER — Telehealth: Payer: Self-pay | Admitting: *Deleted

## 2015-08-02 NOTE — Telephone Encounter (Signed)
Received call from CT stating that patient was a no-show for her scans today. They spoke to her yesterday to confirm that she had her contrast and confirm appointment. Dr Marin Olp notified.

## 2015-08-05 ENCOUNTER — Other Ambulatory Visit: Payer: Medicaid Other

## 2015-08-05 ENCOUNTER — Ambulatory Visit: Payer: Medicaid Other

## 2015-08-05 ENCOUNTER — Ambulatory Visit: Payer: Medicaid Other | Admitting: Hematology & Oncology

## 2015-08-06 ENCOUNTER — Ambulatory Visit: Payer: Medicaid Other

## 2015-08-06 ENCOUNTER — Other Ambulatory Visit: Payer: Medicaid Other

## 2015-08-09 ENCOUNTER — Ambulatory Visit
Admission: RE | Admit: 2015-08-09 | Discharge: 2015-08-09 | Disposition: A | Discharge status: Home / Self Care | Payer: Medicaid Other | Source: Ambulatory Visit | Attending: Radiation Oncology | Admitting: Radiation Oncology

## 2015-08-09 DIAGNOSIS — C7931 Secondary malignant neoplasm of brain: Secondary | ICD-10-CM

## 2015-08-09 MED ORDER — GADOBENATE DIMEGLUMINE 529 MG/ML IV SOLN
14.0000 mL | Freq: Once | INTRAVENOUS | Status: AC | PRN
  Administered 2015-08-09: 14 mL via INTRAVENOUS

## 2015-08-12 ENCOUNTER — Telehealth: Payer: Self-pay | Admitting: *Deleted

## 2015-08-12 ENCOUNTER — Telehealth: Payer: Self-pay | Admitting: Radiation Oncology

## 2015-08-12 ENCOUNTER — Ambulatory Visit: Admission: RE | Admit: 2015-08-12 | Payer: Medicaid Other | Source: Ambulatory Visit | Admitting: Radiation Oncology

## 2015-08-12 NOTE — Telephone Encounter (Signed)
Sister left msg on triage stating needing md to rx sister a walking cane & approve her for hooveround. Called sister back infor her pt will need make appt last saw md back 03/2014. Sister stated pt is current taking cancer treatment once they see her schedule will call us back to set-up appt...Mary [name]

## 2015-08-12 NOTE — Telephone Encounter (Signed)
Patient has no shown for follow up appointment with [name] to review MRI. Phoned patient's home. Patient unable to make appointment this morning due to transportation and request Mont Dutton, RT call her to reschedule.

## 2015-08-13 ENCOUNTER — Other Ambulatory Visit: Payer: Medicaid Other

## 2015-08-13 ENCOUNTER — Ambulatory Visit: Payer: Medicaid Other

## 2015-08-15 ENCOUNTER — Ambulatory Visit
Admission: RE | Admit: 2015-08-15 | Discharge: 2015-08-15 | Disposition: A | Discharge status: Home / Self Care | Payer: Medicaid Other | Source: Ambulatory Visit | Attending: Radiation Oncology | Admitting: Radiation Oncology

## 2015-08-15 ENCOUNTER — Encounter: Payer: Self-pay | Admitting: Radiation Oncology

## 2015-08-15 VITALS — BP 136/88 | HR 78 | Resp 16 | Wt 164.5 lb

## 2015-08-15 DIAGNOSIS — C50411 Malignant neoplasm of upper-outer quadrant of right female breast: Secondary | ICD-10-CM | POA: Insufficient documentation

## 2015-08-15 DIAGNOSIS — C50919 Malignant neoplasm of unspecified site of unspecified female breast: Secondary | ICD-10-CM

## 2015-08-15 DIAGNOSIS — C50911 Malignant neoplasm of unspecified site of right female breast: Secondary | ICD-10-CM

## 2015-08-15 DIAGNOSIS — C7931 Secondary malignant neoplasm of brain: Secondary | ICD-10-CM | POA: Insufficient documentation

## 2015-08-15 MED ORDER — DEXAMETHASONE 4 MG PO TABS: 4.0000 mg | ORAL_TABLET | Freq: Two times a day (BID) | ORAL | Status: DC

## 2015-08-15 MED ORDER — OXYCODONE HCL 5 MG PO TABS: 5.0000 mg | ORAL_TABLET | Freq: Four times a day (QID) | ORAL | Status: DC | PRN

## 2015-08-15 NOTE — Progress Notes (Signed)
Patient here today to review brain MRI. Patient reports an increase in frequency of headaches that begin in her occipital region and radiate to the frontal region of her brain. Reports that when her headaches are severe her vision is also blurry. Reports her gait is occasionally unsteady when her headaches are severe and she doesn't sleep well either. Denies taking any decadron at this time but, is only able to recall the names of a few medications she is taking. Denies ringing in the ears, nausea or vomiting. Requesting Oxy IR refill  BP 136/88 mmHg  Pulse 78  Resp 16  Wt 164 lb 8 oz (74.617 kg)  SpO2 100% Wt Readings from Last 3 Encounters:  08/15/15 164 lb 8 oz (74.617 kg)  08/09/15 158 lb (71.668 kg)  07/25/15 157 lb (71.215 kg)

## 2015-08-15 NOTE — Progress Notes (Addendum)
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 over 75 brain metastases, several showing hemorrhage 198.3 C79.31 oxyCODONE (OXY IR/ROXICODONE) 5 MG immediate release tablet     dexamethasone (DECADRON) 4 MG tablet     dexamethasone (DECADRON) 4 MG tablet  2. Breast cancer metastasized to multiple sites, unspecified laterality 174.9 C50.919 oxyCODONE (OXY IR/ROXICODONE) 5 MG immediate release tablet   199.0 C79.9 dexamethasone (DECADRON) 4 MG tablet     dexamethasone (DECADRON) 4 MG tablet  3. Breast cancer metastasized to multiple sites, right 174.9 C50.911 oxyCODONE (OXY IR/ROXICODONE) 5 MG immediate release tablet   199.0 C79.9 dexamethasone (DECADRON) 4 MG tablet     dexamethasone (DECADRON) 4 MG tablet     Interval Since Last Radiation:  5 months.   Narrative:  The patient returns today for routine follow-up. Patient here today to review brain MRI. Patient reports an increase in frequency of headaches that begin in her occipital region and radiate to the frontal region of her brain. Reports that when her headaches are severe her vision becomes blurry. Reports her gait is occasionally unsteady when her headaches are severe and she doesn't sleep well either. Denies taking any decadron at this time, but is only able to recall the names of a few medications she is taking. Denies ringing in the ears, nausea or vomiting. Requesting Oxy IR refill. The pt reports that [name] stopped her treatment due to dental work.  ALLERGIES:  is allergic to hydrocodone; aspirin; and  penicillins.  Meds: Current Outpatient Prescriptions  Medication Sig Dispense Refill  . acidophilus (RISAQUAD) CAPS capsule Take 1 capsule by mouth daily.    Marland Kitchen amLODipine (NORVASC) 5 MG tablet Take 1 tablet (5 mg total) by mouth every morning. 30 tablet 4  . aspirin-acetaminophen-caffeine (EXCEDRIN MIGRAINE) [id number] MG per tablet Take by mouth every 6 (six) hours as needed for headache.    . chlorhexidine (PERIDEX) 0.12 % solution Rinse with 15 mls three times a day after breakfast, lunch and at bedtime. Spit out excess. Do not swallow. 1440 mL prn  . Cyanocobalamin (VITAMIN B-12 PO) Take by mouth.    . dronabinol (MARINOL) 2.5 MG capsule Take 1 capsule (2.5 mg total) by mouth 2 (two) times daily before a meal. 60 capsule 3  . DULoxetine (CYMBALTA) 30 MG capsule Take 1 capsule (30 mg total) by mouth daily. 30 capsule 3  . escitalopram (LEXAPRO) 20 MG tablet Take 1 tablet (20 mg total) by mouth every morning. 30 tablet 4  . lisinopril (PRINIVIL,ZESTRIL) 40 MG tablet Take 1 tablet (40 mg total) by mouth every morning. 30 tablet 4  . LORazepam (ATIVAN) 0.5 MG tablet Take 1 tablet (0.5 mg total) by mouth every 6 (six) hours as needed (Nausea or vomiting). 30 tablet 0  . Multiple Vitamin (MULTIVITAMIN WITH MINERALS) TABS tablet Take 1 tablet by mouth daily.    . ondansetron (ZOFRAN) 8 MG tablet Take 1 tablet (8 mg total) by mouth 2 (two) times daily. Start the day after chemo for 3 days. Then take as  needed for nausea or vomiting. 30 tablet 1  . oxyCODONE (OXY IR/ROXICODONE) 5 MG immediate release tablet Take 1-3 tablets (5-15 mg total) by mouth every 6 (six) hours as needed for severe pain. Take 1-2 if needed for pain due to cancer. 240 tablet 0  . pantoprazole (PROTONIX) 40 MG tablet Take 1 tablet (40 mg total) by mouth 2 (two) times daily. 60 tablet 4  . potassium chloride SA (K-DUR,KLOR-CON) 20 MEQ tablet Take 1 tablet (20 mEq total) by mouth daily. 30 tablet 3  . pyridOXINE (VITAMIN B-6) 100  MG tablet Take 2 tablets (200 mg total) by mouth daily. 60 tablet 11  . SUMAtriptan (IMITREX) 100 MG tablet Take 1 tablet (100 mg total) by mouth every 2 (two) hours as needed for migraine. 10 tablet 3  . dexamethasone (DECADRON) 4 MG tablet Take 1 tablet (4 mg total) by mouth 2 (two) times daily with a meal. 60 tablet 2  . dexamethasone (DECADRON) 4 MG tablet Take 1 tablet (4 mg total) by mouth 2 (two) times daily with a meal. 60 tablet 2  . fluconazole (DIFLUCAN) 100 MG tablet Take 1 tablet (100 mg total) by mouth daily. (Patient not taking: Reported on 08/15/2015) 30 tablet 4  . prochlorperazine (COMPAZINE) 10 MG tablet Take 1 tablet (10 mg total) by mouth every 6 (six) hours as needed for nausea or vomiting. (Patient not taking: Reported on 08/15/2015) 120 tablet 3  . sucralfate (CARAFATE) 1 GM/10ML suspension Take 10 mLs (1 g total) by mouth 4 (four) times daily -  with meals and at bedtime. (Patient not taking: Reported on 08/15/2015) 420 mL 3   No current facility-administered medications for this encounter.    Physical Findings: The patient is in no acute distress. Patient is alert and oriented.  weight is 164 lb 8 oz (74.617 kg). Her blood pressure is 136/88 and her pulse is 78. Her respiration is 16 and oxygen saturation is 100%.   No significant changes.  Lab Findings: Lab Results  Component Value Date   WBC 13.6* 07/25/2015   WBC 6.7 04/20/2014   HGB 9.9* 07/25/2015   HGB 9.6* 04/20/2014   HCT 29.7* 07/25/2015   HCT 29.1* 04/20/2014   PLT 339 07/25/2015   PLT 451* 04/20/2014    Lab Results  Component Value Date   NA 137 07/25/2015   NA 137 04/09/2014   K 3.7 07/25/2015   K 3.5* 04/09/2014   CO2 21 07/25/2015   CO2 23 04/09/2014   GLUCOSE 167* 07/25/2015   GLUCOSE 98 04/09/2014   BUN 20 07/25/2015   BUN 5* 04/09/2014   CREATININE 0.9 07/25/2015   CREATININE 0.59 04/09/2014   BILITOT 0.60 07/25/2015   BILITOT 0.7 04/07/2014   ALKPHOS 168* 07/25/2015   ALKPHOS 179*  04/07/2014   AST 136* 07/25/2015   AST 74* 04/07/2014   ALT 178* 07/25/2015   ALT 34 04/07/2014   PROT 6.6 07/25/2015   PROT 6.7 04/07/2014   ALBUMIN 2.4* 04/07/2014   CALCIUM 9.1 07/25/2015   CALCIUM 9.3 04/09/2014    Radiographic Findings: Mr Jeri Cos Wo Contrast  08/09/2015   CLINICAL DATA:  Right upper outer breast cancer, stage IV. Status post whole-brain radiation therapy in 02/2015. Chemotherapy on hold.  EXAM: MRI HEAD WITHOUT AND WITH CONTRAST  TECHNIQUE: Multiplanar, multiecho pulse sequences of the brain and surrounding structures were obtained without and with intravenous contrast.  CONTRAST:  63m MULTIHANCE GADOBENATE DIMEGLUMINE 529 MG/ML IV SOLN  COMPARISON:  05/31/2015  FINDINGS: There is no evidence of acute infarct, midline shift, or extra-axial fluid collection. There is mild generalized cerebral atrophy. Incidental developmental venous anomalies are again noted in the right cerebellum and right midbrain.  Numerous hemorrhagic brain metastases are again seen. Enhancing lesions throughout both cerebellar hemispheres have essentially all increased in size with mild edema surrounding multiple lesions. For reference, a lesion in the superior left cerebellum measures 1.4 x 1.1 cm (series 10, image 57, previously 7 x 5 mm). There is no significant posterior fossa mass effect. A single residual enhancing lesion in the brainstem is unchanged, measuring 1 mm in the posterior pons just left of midline (series 10, image 42).  Of the enhancing supratentorial brain lesions on this study, over half of them have progressed from the prior MRI. Some of them have increased in size compared to the residual lesions on the prior study, such as a 13 x 9 mm anterior right frontal lesion which extends to involve the dura (series 10, image 94, previously 9 x 6 mm). Others, such as a 5 mm medial right occipital lesion (series 10, image 68), were not visible on postcontrast images on the prior study but  correspond to lesions previously demonstrated on the pretreatment examination. There is mild edema surrounding some of these lesions which is new/ increased from the prior study but without associated mass effect.  Orbits are unremarkable. There is mild left maxillary sinus mucosal thickening. Mastoid air cells are clear. Major intracranial vascular flow voids are preserved. No destructive osseous lesion is seen.  IMPRESSION: Interval increase in size of numerous brain metastases, greatest in the cerebellum. Mild edema surrounding some lesions without significant mass effect.   Electronically Signed   By: Logan Bores   On: 08/09/2015 13:33   Ct Maxillofacial W/cm  07/25/2015   CLINICAL DATA:  Left maxillary facial region mass. White spots in the roof of her mouth. Tooth pain. Metastatic right breast cancer. Last chemotherapy 2 weeks ago.  EXAM: CT MAXILLOFACIAL WITH CONTRAST  TECHNIQUE: Multidetector CT imaging of the maxillofacial structures was performed with intravenous contrast. Multiplanar CT image reconstructions were also generated. A small metallic BB was placed on the right temple in order to reliably differentiate right from left.  CONTRAST:  39m OMNIPAQUE IOHEXOL 300 MG/ML  SOLN  COMPARISON:  Previous examinations, including the maxillofacial CT without contrast dated 02/19/2015.  FINDINGS: Again demonstrated are multiple upper and lower teeth with periapical lucencies. The 1 remaining right lower molar contains multiple cavities. The periapical lucency involving the first left upper molar has small extensions through the floor of the maxillary sinus with left maxillary sinus mucosal thickening again demonstrated. No air-fluid levels are seen. No soft tissue fluid collections or air seen. Cervical spine degenerative changes are noted.  IMPRESSION: 1. Dental disease with multiple upper and lower tooth periapical abscesses. A left upper molar periapical abscess extending into the inferior left  maxillary sinus with associated chronic maxillary sinus mucosal thickening compatible with chronic sinusitis. 2. No soft tissue abscess seen.   Electronically Signed   By: SClaudie ReveringM.D.   On: 07/25/2015 12:26   Impression:  The patient has progressive widespread brain metastases.  The [facility] the recurrent [facility] and the relatively brief interval after whole brain radiation calls into question whether re-irradiation of the whole brain could carry a durable response.  At this point, I wonder whether the patient would be eligible to resume anti-her-2 type systemic therapy which  may have some activity in the brain, reserving re-irradiation for salvage later if appropriate.  She may be eligible for clinical trial at [facility] in their breast cancer brain met clinic versus treatment off protocol here with [name].  Plan:  I have refilled the pt's oxycodone and Ms. Baldiwn will start Dexamethasone 4 mg bid. I have sent the Dexamethasone to the [facility].   This document serves as a record of services personally performed by Tyler Pita, MD. It was created on his behalf by Darcus Austin, a trained medical scribe. The creation of this record is based on the scribe's personal observations and the provider's statements to them. This document has been checked and approved by the attending provider.     _____________________________________  Sheral Apley. Tammi Klippel, M.D.

## 2015-08-26 ENCOUNTER — Telehealth: Payer: Self-pay | Admitting: *Deleted

## 2015-08-26 NOTE — Telephone Encounter (Signed)
Patient c/o multiple instances of balance loss and falls. Denies hitting head or other injuries. Spoke with Dr Marin Olp who wants patient to come into be seen. Will set up appointment in the next 48 hours.

## 2015-08-27 ENCOUNTER — Other Ambulatory Visit: Payer: Medicaid Other

## 2015-08-27 ENCOUNTER — Ambulatory Visit: Payer: Medicaid Other

## 2015-08-28 ENCOUNTER — Encounter: Payer: Self-pay | Admitting: Hematology & Oncology

## 2015-08-28 ENCOUNTER — Ambulatory Visit (HOSPITAL_BASED_OUTPATIENT_CLINIC_OR_DEPARTMENT_OTHER): Payer: Medicaid Other | Admitting: Hematology & Oncology

## 2015-08-28 ENCOUNTER — Other Ambulatory Visit (HOSPITAL_BASED_OUTPATIENT_CLINIC_OR_DEPARTMENT_OTHER): Payer: Medicaid Other

## 2015-08-28 VITALS — BP 131/93 | HR 94 | Temp 98.0°F | Resp 16 | Ht 62.0 in | Wt 162.0 lb

## 2015-08-28 DIAGNOSIS — C7951 Secondary malignant neoplasm of bone: Secondary | ICD-10-CM

## 2015-08-28 DIAGNOSIS — C50911 Malignant neoplasm of unspecified site of right female breast: Secondary | ICD-10-CM

## 2015-08-28 DIAGNOSIS — C787 Secondary malignant neoplasm of liver and intrahepatic bile duct: Secondary | ICD-10-CM

## 2015-08-28 DIAGNOSIS — R6884 Jaw pain: Secondary | ICD-10-CM

## 2015-08-28 DIAGNOSIS — C50912 Malignant neoplasm of unspecified site of left female breast: Secondary | ICD-10-CM

## 2015-08-28 DIAGNOSIS — Z170 Estrogen receptor positive status [ER+]: Secondary | ICD-10-CM

## 2015-08-28 DIAGNOSIS — G629 Polyneuropathy, unspecified: Secondary | ICD-10-CM

## 2015-08-28 LAB — CBC WITH DIFFERENTIAL (CANCER CENTER ONLY)
BASO#: 0 10*3/uL (ref 0.0–0.2)
BASO%: 0.2 % (ref 0.0–2.0)
EOS%: 0.3 % (ref 0.0–7.0)
Eosinophils Absolute: 0 10*3/uL (ref 0.0–0.5)
HEMATOCRIT: 40.2 % (ref 34.8–46.6)
HGB: 13.3 g/dL (ref 11.6–15.9)
LYMPH#: 1.4 10*3/uL (ref 0.9–3.3)
LYMPH%: 14.5 % (ref 14.0–48.0)
MCH: 35.2 pg — ABNORMAL HIGH (ref 26.0–34.0)
MCHC: 33.1 g/dL (ref 32.0–36.0)
MCV: 106 fL — AB (ref 81–101)
MONO#: 1.1 10*3/uL — AB (ref 0.1–0.9)
MONO%: 11 % (ref 0.0–13.0)
NEUT%: 74 % (ref 39.6–80.0)
NEUTROS ABS: 7.2 10*3/uL — AB (ref 1.5–6.5)
Platelets: 218 10*3/uL (ref 145–400)
RBC: 3.78 10*6/uL (ref 3.70–5.32)
RDW: 14.6 % (ref 11.1–15.7)
WBC: 9.7 10*3/uL (ref 3.9–10.0)

## 2015-08-28 LAB — COMPREHENSIVE METABOLIC PANEL
ALK PHOS: 137 U/L — AB (ref 33–130)
ALT: 213 U/L — ABNORMAL HIGH (ref 6–29)
AST: 75 U/L — ABNORMAL HIGH (ref 10–35)
Albumin: 3.4 g/dL — ABNORMAL LOW (ref 3.6–5.1)
BILIRUBIN TOTAL: 0.8 mg/dL (ref 0.2–1.2)
BUN: 34 mg/dL — AB (ref 7–25)
CO2: 26 mmol/L (ref 20–31)
Calcium: 9 mg/dL (ref 8.6–10.4)
Chloride: 104 mmol/L (ref 98–110)
Creatinine, Ser: 0.8 mg/dL (ref 0.50–1.05)
Glucose, Bld: 71 mg/dL (ref 65–99)
POTASSIUM: 3.7 mmol/L (ref 3.5–5.3)
Sodium: 140 mmol/L (ref 135–146)
TOTAL PROTEIN: 5.9 g/dL — AB (ref 6.1–8.1)

## 2015-08-28 LAB — CANCER ANTIGEN 27.29: CA 27.29: 124 U/mL — AB (ref 0–39)

## 2015-08-28 LAB — TECHNOLOGIST REVIEW CHCC SATELLITE

## 2015-08-28 MED ORDER — PALBOCICLIB 125 MG PO CAPS: ORAL_CAPSULE | ORAL | Status: DC

## 2015-08-28 MED ORDER — GABAPENTIN 300 MG PO CAPS: 300.0000 mg | ORAL_CAPSULE | Freq: Three times a day (TID) | ORAL | Status: AC

## 2015-08-28 MED ORDER — EXEMESTANE 25 MG PO TABS: 25.0000 mg | ORAL_TABLET | Freq: Every day | ORAL | Status: DC

## 2015-08-28 NOTE — Progress Notes (Signed)
Hematology and Oncology Follow Up Visit  Mary [name] [id number] 1956-10-29 59 y.o. 08/28/2015   Principle Diagnosis:   Metastatic breast cancer-triple positive  Current Therapy:   Status post 5 cycles of carboplatin/Gemzar Mary [name] /Herceptin  Zometa 4 mg IV every month- will hold this for dental work     Interim History:  [name] is back for follow-up.  She has had a tough time lately. She recently had an MRI of the brain. This, unfortunately, showed progression of brain metastasis. She's already had stereotactic radiosurgery. She has had whole brain radiation.  Her radiation oncologist does not think that they have any else to offer her. He made an excellent recommendation to see about getting her seen at the brain metastasis specialty clinic. I have called them. They will get in touch with her.  She has had the issues with the mouth. She has exposure of her maxilla up on the left side. She is going to be seen at [facility]istry [facility] and hopefully they might be able to help.  We have had to hold her systemic chemotherapy because of her blood counts.  She will be needing follow-up scans so we can see how her systemic disease is looking.  She's not complaining of any pain. Her appetite has been okay. She's had no change in bowel or bladder habits.  There has been some occasional bleeding from the right breast tumor. She has a Band-Aid on this region. Per Rath's been no right arm swelling.  She's had no issues with rashes. There's been no leg swelling.  She is on some Decadron because of the brain metastasis.  Overall,  her performance status is ECOG 1.  Medications:  Current outpatient prescriptions:  .  acidophilus (RISAQUAD) CAPS capsule, Take 1 capsule by mouth daily., Disp: , Rfl:  .  amLODipine (NORVASC) 5 MG tablet, Take 1 tablet (5 mg total) by mouth every morning., Disp: 30 tablet, Rfl: 4 .  aspirin-acetaminophen-caffeine (EXCEDRIN MIGRAINE)  [phone] MG per tablet, Take by mouth every 6 (six) hours as needed for headache., Disp: , Rfl:  .  chlorhexidine (PERIDEX) 0.12 % solution, Rinse with 15 mls three times a day after breakfast, lunch and at bedtime. Spit out excess. Do not swallow., Disp: 1440 mL, Rfl: prn .  Cyanocobalamin (VITAMIN B-12 PO), Take by mouth., Disp: , Rfl:  .  dexamethasone (DECADRON) 4 MG tablet, Take 1 tablet (4 mg total) by mouth 2 (two) times daily with a meal., Disp: 60 tablet, Rfl: 2 .  dexamethasone (DECADRON) 4 MG tablet, Take 1 tablet (4 mg total) by mouth 2 (two) times daily with a meal., Disp: 60 tablet, Rfl: 2 .  dronabinol (MARINOL) 2.5 MG capsule, Take 1 capsule (2.5 mg total) by mouth 2 (two) times daily before a meal., Disp: 60 capsule, Rfl: 3 .  escitalopram (LEXAPRO) 20 MG tablet, Take 1 tablet (20 mg total) by mouth every morning., Disp: 30 tablet, Rfl: 4 .  fluconazole (DIFLUCAN) 100 MG tablet, Take 1 tablet (100 mg total) by mouth daily., Disp: 30 tablet, Rfl: 4 .  lisinopril (PRINIVIL,ZESTRIL) 40 MG tablet, Take 1 tablet (40 mg total) by mouth every morning., Disp: 30 tablet, Rfl: 4 .  LORazepam (ATIVAN) 0.5 MG tablet, Take 1 tablet (0.5 mg total) by mouth every 6 (six) hours as needed (Nausea or vomiting)., Disp: 30 tablet, Rfl: 0 .  Multiple Vitamin (MULTIVITAMIN WITH MINERALS) TABS tablet, Take 1 tablet by mouth daily., Disp: , Rfl:  .  ondansetron (ZOFRAN) 8 MG tablet, Take 1 tablet (8 mg total) by mouth 2 (two) times daily. Start the day after chemo for 3 days. Then take as needed for nausea or vomiting., Disp: 30 tablet, Rfl: 1 .  oxyCODONE (OXY IR/ROXICODONE) 5 MG immediate release tablet, Take 1-3 tablets (5-15 mg total) by mouth every 6 (six) hours as needed for severe pain. Take 1-2 if needed for pain due to cancer., Disp: 240 tablet, Rfl: 0 .  pantoprazole (PROTONIX) 40 MG tablet, Take 1 tablet (40 mg total) by mouth 2 (two) times daily., Disp: 60 tablet, Rfl: 4 .  potassium chloride SA  (K-DUR,KLOR-CON) 20 MEQ tablet, Take 1 tablet (20 mEq total) by mouth daily., Disp: 30 tablet, Rfl: 3 .  prochlorperazine (COMPAZINE) 10 MG tablet, Take 1 tablet (10 mg total) by mouth every 6 (six) hours as needed for nausea or vomiting., Disp: 120 tablet, Rfl: 3 .  pyridOXINE (VITAMIN B-6) 100 MG tablet, Take 2 tablets (200 mg total) by mouth daily., Disp: 60 tablet, Rfl: 11 .  sucralfate (CARAFATE) 1 GM/10ML suspension, Take 10 mLs (1 g total) by mouth 4 (four) times daily -  with meals and at bedtime., Disp: 420 mL, Rfl: 3 .  SUMAtriptan (IMITREX) 100 MG tablet, Take 1 tablet (100 mg total) by mouth every 2 (two) hours as needed for migraine., Disp: 10 tablet, Rfl: 3 .  exemestane (AROMASIN) 25 MG tablet, Take 1 tablet (25 mg total) by mouth daily after breakfast., Disp: 30 tablet, Rfl: 6 .  gabapentin (NEURONTIN) 300 MG capsule, Take 1 capsule (300 mg total) by mouth 3 (three) times daily., Disp: 90 capsule, Rfl: 4 .  palbociclib (IBRANCE) 125 MG capsule, Take whole with food every day for 21 days and then off for 7 days, Disp: 21 capsule, Rfl: 4  Allergies:  Allergies  Allergen Reactions  . Hydrocodone     "makes me crawl on the floor"  . Aspirin Other (See Comments)    Due to ulcers  . Penicillins Swelling    Past Medical History, Surgical history, Social history, and Family History were reviewed and updated.  Review of Systems: As above  Physical Exam:  height is 5\' 2"  (1.575 m) and weight is 162 lb (73.483 kg). Her oral temperature is 98 F (36.7 C). Her blood pressure is 131/93 and her pulse is 94. Her respiration is 16.   Fairly well-built and well-nourished African-American female. She has no ocular or oral lesions. There is no palpable cervical or supraclavicular lymph nodes. Lungs are clear. Cardiac exam regular rate and rhythm with no murmurs, rubs or bruits. Breast exam shows some moderate swelling of the right breast. She still has some firmness about the areola. She does  have some palpable right axillary lymph nodes. She has a dressing over her wound site in the right axilla. Left breast is unremarkable remarkable. Abdomen is soft. She has good bowel sounds. There is no fluid wave. There is no palpable liver or spleen tip. Extremity shows no clubbing, cyanosis or edema. Neurological exam shows no focal neurological deficits. Skin exam shows no rashes, ecchymoses or petechia.  Lab Results  Component Value Date   WBC 9.7 08/28/2015   HGB 13.3 08/28/2015   HCT 40.2 08/28/2015   MCV 106* 08/28/2015   PLT 218 08/28/2015     Chemistry      Component Value Date/Time   NA 137 07/25/2015 1002   NA 137 04/09/2014 0450   K 3.7 07/25/2015 1002  K 3.5* 04/09/2014 0450   CL 109* 07/25/2015 1002   CL 101 04/09/2014 0450   CO2 21 07/25/2015 1002   CO2 23 04/09/2014 0450   BUN 20 07/25/2015 1002   BUN 5* 04/09/2014 0450   CREATININE 0.9 07/25/2015 1002   CREATININE 0.59 04/09/2014 0450      Component Value Date/Time   CALCIUM 9.1 07/25/2015 1002   CALCIUM 9.3 04/09/2014 0450   ALKPHOS 168* 07/25/2015 1002   ALKPHOS 179* 04/07/2014 0630   AST 136* 07/25/2015 1002   AST 74* 04/07/2014 0630   ALT 178* 07/25/2015 1002   ALT 34 04/07/2014 0630   BILITOT 0.60 07/25/2015 1002   BILITOT 0.7 04/07/2014 0630         Impression and Plan: Ms. Bernardi is 59 year old African American female. She is postmenopausal. She has a triple positive metastatic breast cancer.   We are in a tough situation right now. She really has had some time with chemotherapy lately. Her blood counts really have had a very hard time.  I think we might be the point where we actually can try antiestrogen therapy. I think this would be worthwhile. We have accomplished quite a bit with systemic chemotherapy. We've gotten her systemic tumor burden down quite a bit. However, we' have had to make some delays with treatment because of her blood counts. We have had to transfuse her.  I think that  antiestrogen therapy would really be helpful right now. As such, I'm going to try her on Aromasin/Ibrance. I think this would be very reasonable and should be considered front-line therapy.  I will hold the Herceptin and Perjeta for right now.  The role problem that she is going to have will be the brain metastases. She is already had a couple rounds of radiation therapy to the brain.  I will see if we can refer her to the brain metastases specialty clinic at [facility]. I have made the phone call and have given them the information. I think this would be reasonable   she is on Decadron.  She is now 18 months out from initial diagnosis. She has done very well. Her CA 27.29 has come down from 3300 down to her last value of 73.  She definitely needs to have some repeat CT scans done so we can assess her systemic disease.  I spent an hour with her. I talked her about the brain metastasis. I told her how difficult this may be to try to treat. She agrees to the referral.  I talked her about the Aromasin and Ibrance. Hopefully, the insurance will cover the [place].  She also has the jaw issues. She is going down to [facility] to be seen for that.  I will plan to get her back to see [place] in 2 weeks. Hopefully, we will find that she might be feeling better.  Volanda Napoleon, MD 8/31/20162:14 PM

## 2015-08-29 ENCOUNTER — Telehealth: Payer: Self-pay | Admitting: *Deleted

## 2015-08-29 NOTE — Telephone Encounter (Signed)
Followed up with pharmacy regarding the [place] prescriptions sent yesterday. Both were approved by insurance with a $3 each co-pay.

## 2015-08-30 ENCOUNTER — Other Ambulatory Visit: Payer: Self-pay | Admitting: *Deleted

## 2015-08-30 DIAGNOSIS — C50912 Malignant neoplasm of unspecified site of left female breast: Secondary | ICD-10-CM

## 2015-09-03 ENCOUNTER — Telehealth: Payer: Self-pay | Admitting: *Deleted

## 2015-09-03 NOTE — Telephone Encounter (Signed)
Patient's sister wants to know what the name and phone number is of the doctor that will be removing patient's teeth tomorrow. Chart reviewed and details not seen. This referral was initiated by another physician and Dr Marin Olp is unaware of the specifics. Patient's sister given the number for [facility] as a point of contact.

## 2015-09-04 ENCOUNTER — Other Ambulatory Visit (HOSPITAL_BASED_OUTPATIENT_CLINIC_OR_DEPARTMENT_OTHER): Payer: Self-pay

## 2015-09-04 ENCOUNTER — Ambulatory Visit (HOSPITAL_BASED_OUTPATIENT_CLINIC_OR_DEPARTMENT_OTHER)
Admission: RE | Admit: 2015-09-04 | Discharge: 2015-09-04 | Disposition: A | Discharge status: Home / Self Care | Payer: Medicaid Other | Source: Ambulatory Visit | Attending: Hematology & Oncology | Admitting: Hematology & Oncology

## 2015-09-04 DIAGNOSIS — J90 Pleural effusion, not elsewhere classified: Secondary | ICD-10-CM | POA: Diagnosis not present

## 2015-09-04 DIAGNOSIS — N63 Unspecified lump in breast: Secondary | ICD-10-CM | POA: Diagnosis not present

## 2015-09-04 DIAGNOSIS — G629 Polyneuropathy, unspecified: Secondary | ICD-10-CM | POA: Insufficient documentation

## 2015-09-04 DIAGNOSIS — C786 Secondary malignant neoplasm of retroperitoneum and peritoneum: Secondary | ICD-10-CM | POA: Insufficient documentation

## 2015-09-04 DIAGNOSIS — C50912 Malignant neoplasm of unspecified site of left female breast: Secondary | ICD-10-CM | POA: Diagnosis present

## 2015-09-04 DIAGNOSIS — R590 Localized enlarged lymph nodes: Secondary | ICD-10-CM | POA: Insufficient documentation

## 2015-09-04 DIAGNOSIS — C799 Secondary malignant neoplasm of unspecified site: Secondary | ICD-10-CM | POA: Insufficient documentation

## 2015-09-04 MED ORDER — IOHEXOL 300 MG/ML  SOLN
100.0000 mL | Freq: Once | INTRAMUSCULAR | Status: AC | PRN
  Administered 2015-09-04: 100 mL via INTRAVENOUS

## 2015-09-05 ENCOUNTER — Other Ambulatory Visit: Payer: Self-pay | Admitting: *Deleted

## 2015-09-05 DIAGNOSIS — C50912 Malignant neoplasm of unspecified site of left female breast: Secondary | ICD-10-CM

## 2015-09-11 ENCOUNTER — Other Ambulatory Visit: Payer: Medicaid Other

## 2015-09-11 ENCOUNTER — Ambulatory Visit: Payer: Self-pay | Admitting: Family

## 2015-09-17 ENCOUNTER — Other Ambulatory Visit: Payer: Medicaid Other

## 2015-09-17 ENCOUNTER — Ambulatory Visit: Payer: Medicaid Other

## 2015-09-24 ENCOUNTER — Other Ambulatory Visit: Payer: Self-pay | Admitting: *Deleted

## 2015-09-24 DIAGNOSIS — C50919 Malignant neoplasm of unspecified site of unspecified female breast: Secondary | ICD-10-CM

## 2015-09-24 DIAGNOSIS — C7931 Secondary malignant neoplasm of brain: Secondary | ICD-10-CM

## 2015-09-24 DIAGNOSIS — C50911 Malignant neoplasm of unspecified site of right female breast: Secondary | ICD-10-CM

## 2015-09-24 MED ORDER — OXYCODONE HCL 5 MG PO TABS: 5.0000 mg | ORAL_TABLET | Freq: Four times a day (QID) | ORAL | Status: DC | PRN

## 2015-09-27 ENCOUNTER — Telehealth: Payer: Self-pay | Admitting: Internal Medicine

## 2015-09-27 NOTE — Telephone Encounter (Signed)
Gave sister MD response.  Sister stated she understood and would find patient another MD who takes [place] [facility].

## 2015-09-27 NOTE — Telephone Encounter (Signed)
Very sorry, as we do not take [place] access , and any referral would need to come from her PCP to whom she has been assigned (pt would refer to her card for the name)

## 2015-09-27 NOTE — Telephone Encounter (Signed)
Patients sister called in stating that her cancer doctor is wanting her primary MD to refer her for home health assistance.  The issue that the patient has is that she has not been in to see Dr. Jenny Reichmann in two years and does not plan to come in and the second thing is that the patient only has [place] Access and nothing else.  I told the sister that the patient would have to have an appointment for the forms to be completed and that we do not take [place] access.  She did not believe this and would like Dr. Gwynn Burly assistant to call back in regards to give her some advise for what the patient should do.

## 2015-09-27 NOTE — Telephone Encounter (Signed)
Please advise 

## 2015-09-30 ENCOUNTER — Telehealth: Payer: Self-pay | Admitting: *Deleted

## 2015-09-30 ENCOUNTER — Encounter: Payer: Self-pay | Admitting: *Deleted

## 2015-09-30 DIAGNOSIS — C50912 Malignant neoplasm of unspecified site of left female breast: Secondary | ICD-10-CM

## 2015-09-30 NOTE — Progress Notes (Signed)
Followed up with [facility] to determine if patient was ever seen.  Spoke to [employer] who states that documentation shows that the clinic spoke to her on September 2nd and made appointment for September 6th. She was a no-show. They rescheduled for September 21st and again patient did not show to the appointment. Clinic states that patient can still be seen, she will just need to call and schedule another appointment.   All of this reviewed with Dr Marin Olp. Dr Marin Olp would like her family called to arrange for patient to make appointment to be seen at their clinic

## 2015-09-30 NOTE — Telephone Encounter (Signed)
Patient's sister, her caretaker, would like an order for a BSC. Dr Marin Olp is fine with this. Will send orders to [facility].   Also spoke to [name] about scheduling the patient at the Brain Metastasis Clinic at [facility]. Gave her the number to set up appointment.   [name] was instructed to call Shelton Silvas at [phone]. She agrees to call.

## 2015-10-02 ENCOUNTER — Other Ambulatory Visit: Payer: Medicaid Other

## 2015-10-02 ENCOUNTER — Ambulatory Visit: Payer: Self-pay

## 2015-10-02 ENCOUNTER — Ambulatory Visit: Payer: Self-pay | Admitting: Family

## 2015-10-04 ENCOUNTER — Encounter: Payer: Self-pay | Admitting: Nurse Practitioner

## 2015-10-04 NOTE — Progress Notes (Signed)
Patient's sister called upset stating the "husband is refusing to take her to the appointments, pay any of her bills, and is refusing to giver her any of her medications as ordered. The sister stated "he has taken out life insurance and is waiting for to pass away and move on with his life". I informed the sister that we are sorry to hear this and hope that the husband is being compliant with Dr. Antonieta Pert orders. She stated her and the family are trying to gain custody of Mary [name]. Informed her we could not get involved with any legal advice and or decisions but to know that we are here to take care of the patient and ensure she is safe from a  Medical stand point.

## 2015-10-05 ENCOUNTER — Inpatient Hospital Stay (HOSPITAL_COMMUNITY)
Admission: EM | Admit: 2015-10-05 | Discharge: 2015-10-10 | DRG: 054 | Disposition: A | Discharge status: Home Health | Payer: Medicaid Other | Attending: Internal Medicine | Admitting: Internal Medicine

## 2015-10-05 ENCOUNTER — Encounter (HOSPITAL_COMMUNITY): Payer: Self-pay | Admitting: *Deleted

## 2015-10-05 DIAGNOSIS — Z8249 Family history of ischemic heart disease and other diseases of the circulatory system: Secondary | ICD-10-CM

## 2015-10-05 DIAGNOSIS — D72819 Decreased white blood cell count, unspecified: Secondary | ICD-10-CM | POA: Diagnosis present

## 2015-10-05 DIAGNOSIS — S21001A Unspecified open wound of right breast, initial encounter: Secondary | ICD-10-CM | POA: Diagnosis present

## 2015-10-05 DIAGNOSIS — T380X5A Adverse effect of glucocorticoids and synthetic analogues, initial encounter: Secondary | ICD-10-CM | POA: Diagnosis present

## 2015-10-05 DIAGNOSIS — L089 Local infection of the skin and subcutaneous tissue, unspecified: Secondary | ICD-10-CM

## 2015-10-05 DIAGNOSIS — E860 Dehydration: Secondary | ICD-10-CM | POA: Diagnosis present

## 2015-10-05 DIAGNOSIS — Z6828 Body mass index (BMI) 28.0-28.9, adult: Secondary | ICD-10-CM

## 2015-10-05 DIAGNOSIS — Z7952 Long term (current) use of systemic steroids: Secondary | ICD-10-CM

## 2015-10-05 DIAGNOSIS — K219 Gastro-esophageal reflux disease without esophagitis: Secondary | ICD-10-CM | POA: Diagnosis present

## 2015-10-05 DIAGNOSIS — Z823 Family history of stroke: Secondary | ICD-10-CM

## 2015-10-05 DIAGNOSIS — R739 Hyperglycemia, unspecified: Secondary | ICD-10-CM | POA: Diagnosis present

## 2015-10-05 DIAGNOSIS — R569 Unspecified convulsions: Secondary | ICD-10-CM

## 2015-10-05 DIAGNOSIS — E876 Hypokalemia: Secondary | ICD-10-CM | POA: Diagnosis not present

## 2015-10-05 DIAGNOSIS — Z8711 Personal history of peptic ulcer disease: Secondary | ICD-10-CM

## 2015-10-05 DIAGNOSIS — F329 Major depressive disorder, single episode, unspecified: Secondary | ICD-10-CM | POA: Diagnosis present

## 2015-10-05 DIAGNOSIS — N61 Mastitis without abscess: Secondary | ICD-10-CM | POA: Diagnosis present

## 2015-10-05 DIAGNOSIS — Z79899 Other long term (current) drug therapy: Secondary | ICD-10-CM

## 2015-10-05 DIAGNOSIS — G43909 Migraine, unspecified, not intractable, without status migrainosus: Secondary | ICD-10-CM | POA: Diagnosis present

## 2015-10-05 DIAGNOSIS — E872 Acidosis: Secondary | ICD-10-CM | POA: Diagnosis present

## 2015-10-05 DIAGNOSIS — Z170 Estrogen receptor positive status [ER+]: Secondary | ICD-10-CM

## 2015-10-05 DIAGNOSIS — G629 Polyneuropathy, unspecified: Secondary | ICD-10-CM

## 2015-10-05 DIAGNOSIS — R32 Unspecified urinary incontinence: Secondary | ICD-10-CM | POA: Diagnosis present

## 2015-10-05 DIAGNOSIS — I10 Essential (primary) hypertension: Secondary | ICD-10-CM | POA: Diagnosis present

## 2015-10-05 DIAGNOSIS — C50912 Malignant neoplasm of unspecified site of left female breast: Secondary | ICD-10-CM

## 2015-10-05 DIAGNOSIS — C7931 Secondary malignant neoplasm of brain: Secondary | ICD-10-CM | POA: Insufficient documentation

## 2015-10-05 DIAGNOSIS — Z923 Personal history of irradiation: Secondary | ICD-10-CM

## 2015-10-05 DIAGNOSIS — Z79811 Long term (current) use of aromatase inhibitors: Secondary | ICD-10-CM

## 2015-10-05 DIAGNOSIS — Z880 Allergy status to penicillin: Secondary | ICD-10-CM

## 2015-10-05 DIAGNOSIS — R221 Localized swelling, mass and lump, neck: Secondary | ICD-10-CM

## 2015-10-05 DIAGNOSIS — Z9221 Personal history of antineoplastic chemotherapy: Secondary | ICD-10-CM

## 2015-10-05 DIAGNOSIS — D509 Iron deficiency anemia, unspecified: Secondary | ICD-10-CM

## 2015-10-05 DIAGNOSIS — T148XXA Other injury of unspecified body region, initial encounter: Secondary | ICD-10-CM

## 2015-10-05 DIAGNOSIS — G40901 Epilepsy, unspecified, not intractable, with status epilepticus: Secondary | ICD-10-CM | POA: Diagnosis present

## 2015-10-05 DIAGNOSIS — E43 Unspecified severe protein-calorie malnutrition: Secondary | ICD-10-CM | POA: Diagnosis present

## 2015-10-05 DIAGNOSIS — C50919 Malignant neoplasm of unspecified site of unspecified female breast: Secondary | ICD-10-CM | POA: Diagnosis present

## 2015-10-05 DIAGNOSIS — C50911 Malignant neoplasm of unspecified site of right female breast: Secondary | ICD-10-CM

## 2015-10-05 NOTE — ED Notes (Signed)
Pt to ED from home c/o seizures, no hx. Pt also noted to be hyperglycemic at 574 without hx of same. Pt has breast cancer to the Right breast with "tumors in the brain." EMS reports 4 seizures, two with family and two enroute. Seizure with EMS lasted ~52min with full body posturing. Pt given 5mg  versed enroute.

## 2015-10-06 ENCOUNTER — Inpatient Hospital Stay (HOSPITAL_COMMUNITY): Payer: Medicaid Other

## 2015-10-06 ENCOUNTER — Encounter (HOSPITAL_COMMUNITY): Payer: Self-pay

## 2015-10-06 ENCOUNTER — Emergency Department (HOSPITAL_COMMUNITY): Payer: Medicaid Other

## 2015-10-06 DIAGNOSIS — G43909 Migraine, unspecified, not intractable, without status migrainosus: Secondary | ICD-10-CM | POA: Diagnosis present

## 2015-10-06 DIAGNOSIS — R739 Hyperglycemia, unspecified: Secondary | ICD-10-CM

## 2015-10-06 DIAGNOSIS — C7931 Secondary malignant neoplasm of brain: Secondary | ICD-10-CM | POA: Diagnosis present

## 2015-10-06 DIAGNOSIS — Z8711 Personal history of peptic ulcer disease: Secondary | ICD-10-CM | POA: Diagnosis not present

## 2015-10-06 DIAGNOSIS — D72819 Decreased white blood cell count, unspecified: Secondary | ICD-10-CM | POA: Diagnosis present

## 2015-10-06 DIAGNOSIS — C50911 Malignant neoplasm of unspecified site of right female breast: Secondary | ICD-10-CM

## 2015-10-06 DIAGNOSIS — Z79899 Other long term (current) drug therapy: Secondary | ICD-10-CM | POA: Diagnosis not present

## 2015-10-06 DIAGNOSIS — K219 Gastro-esophageal reflux disease without esophagitis: Secondary | ICD-10-CM | POA: Diagnosis present

## 2015-10-06 DIAGNOSIS — E872 Acidosis: Secondary | ICD-10-CM | POA: Diagnosis present

## 2015-10-06 DIAGNOSIS — Z170 Estrogen receptor positive status [ER+]: Secondary | ICD-10-CM | POA: Diagnosis not present

## 2015-10-06 DIAGNOSIS — L089 Local infection of the skin and subcutaneous tissue, unspecified: Secondary | ICD-10-CM | POA: Diagnosis not present

## 2015-10-06 DIAGNOSIS — Z823 Family history of stroke: Secondary | ICD-10-CM | POA: Diagnosis not present

## 2015-10-06 DIAGNOSIS — G40901 Epilepsy, unspecified, not intractable, with status epilepticus: Secondary | ICD-10-CM | POA: Insufficient documentation

## 2015-10-06 DIAGNOSIS — Z9221 Personal history of antineoplastic chemotherapy: Secondary | ICD-10-CM | POA: Diagnosis not present

## 2015-10-06 DIAGNOSIS — Z880 Allergy status to penicillin: Secondary | ICD-10-CM | POA: Diagnosis not present

## 2015-10-06 DIAGNOSIS — E876 Hypokalemia: Secondary | ICD-10-CM | POA: Diagnosis not present

## 2015-10-06 DIAGNOSIS — I10 Essential (primary) hypertension: Secondary | ICD-10-CM

## 2015-10-06 DIAGNOSIS — R32 Unspecified urinary incontinence: Secondary | ICD-10-CM | POA: Diagnosis present

## 2015-10-06 DIAGNOSIS — Z923 Personal history of irradiation: Secondary | ICD-10-CM | POA: Diagnosis not present

## 2015-10-06 DIAGNOSIS — E860 Dehydration: Secondary | ICD-10-CM | POA: Diagnosis present

## 2015-10-06 DIAGNOSIS — Z8249 Family history of ischemic heart disease and other diseases of the circulatory system: Secondary | ICD-10-CM | POA: Diagnosis not present

## 2015-10-06 DIAGNOSIS — R569 Unspecified convulsions: Secondary | ICD-10-CM | POA: Diagnosis not present

## 2015-10-06 DIAGNOSIS — Z7952 Long term (current) use of systemic steroids: Secondary | ICD-10-CM | POA: Diagnosis not present

## 2015-10-06 DIAGNOSIS — E43 Unspecified severe protein-calorie malnutrition: Secondary | ICD-10-CM | POA: Diagnosis present

## 2015-10-06 DIAGNOSIS — F329 Major depressive disorder, single episode, unspecified: Secondary | ICD-10-CM | POA: Diagnosis present

## 2015-10-06 DIAGNOSIS — T380X5A Adverse effect of glucocorticoids and synthetic analogues, initial encounter: Secondary | ICD-10-CM | POA: Diagnosis present

## 2015-10-06 DIAGNOSIS — S21001A Unspecified open wound of right breast, initial encounter: Secondary | ICD-10-CM | POA: Diagnosis not present

## 2015-10-06 DIAGNOSIS — N61 Mastitis without abscess: Secondary | ICD-10-CM | POA: Diagnosis present

## 2015-10-06 DIAGNOSIS — T148XXA Other injury of unspecified body region, initial encounter: Secondary | ICD-10-CM

## 2015-10-06 DIAGNOSIS — Z6828 Body mass index (BMI) 28.0-28.9, adult: Secondary | ICD-10-CM | POA: Diagnosis not present

## 2015-10-06 DIAGNOSIS — Z79811 Long term (current) use of aromatase inhibitors: Secondary | ICD-10-CM | POA: Diagnosis not present

## 2015-10-06 DIAGNOSIS — T148 Other injury of unspecified body region: Secondary | ICD-10-CM | POA: Diagnosis not present

## 2015-10-06 DIAGNOSIS — C50919 Malignant neoplasm of unspecified site of unspecified female breast: Secondary | ICD-10-CM | POA: Diagnosis present

## 2015-10-06 DIAGNOSIS — S21001S Unspecified open wound of right breast, sequela: Secondary | ICD-10-CM | POA: Diagnosis not present

## 2015-10-06 LAB — CBC
HCT: 30.3 % — ABNORMAL LOW (ref 36.0–46.0)
HEMOGLOBIN: 10.2 g/dL — AB (ref 12.0–15.0)
MCH: 33.9 pg (ref 26.0–34.0)
MCHC: 33.7 g/dL (ref 30.0–36.0)
MCV: 100.7 fL — AB (ref 78.0–100.0)
Platelets: 218 10*3/uL (ref 150–400)
RBC: 3.01 MIL/uL — AB (ref 3.87–5.11)
RDW: 14.6 % (ref 11.5–15.5)
WBC: 2.7 10*3/uL — ABNORMAL LOW (ref 4.0–10.5)

## 2015-10-06 LAB — COMPREHENSIVE METABOLIC PANEL
ALBUMIN: 2.2 g/dL — AB (ref 3.5–5.0)
ALK PHOS: 71 U/L (ref 38–126)
ALT: 16 U/L (ref 14–54)
ANION GAP: 12 (ref 5–15)
AST: 26 U/L (ref 15–41)
BUN: 10 mg/dL (ref 6–20)
CALCIUM: 8.4 mg/dL — AB (ref 8.9–10.3)
CHLORIDE: 101 mmol/L (ref 101–111)
CO2: 25 mmol/L (ref 22–32)
CREATININE: 0.67 mg/dL (ref 0.44–1.00)
GFR calc non Af Amer: >60 mL/min (ref >60)
GLUCOSE: 202 mg/dL — AB (ref 65–99)
Potassium: 3.3 mmol/L — ABNORMAL LOW (ref 3.5–5.1)
SODIUM: 138 mmol/L (ref 135–145)
Total Bilirubin: 0.7 mg/dL (ref 0.3–1.2)
Total Protein: 4.9 g/dL — ABNORMAL LOW (ref 6.5–8.1)

## 2015-10-06 LAB — LACTIC ACID, PLASMA
LACTIC ACID, VENOUS: 4 mmol/L — AB (ref 0.5–2.0)
LACTIC ACID, VENOUS: 1.3 mmol/L (ref 0.5–2.0)

## 2015-10-06 LAB — CBC WITH DIFFERENTIAL/PLATELET
BASOS ABS: 0 10*3/uL (ref 0.0–0.1)
Basophils Relative: 0 %
EOS ABS: 0 10*3/uL (ref 0.0–0.7)
Eosinophils Relative: 0 %
HCT: 33.2 % — ABNORMAL LOW (ref 36.0–46.0)
Hemoglobin: 11.4 g/dL — ABNORMAL LOW (ref 12.0–15.0)
LYMPHS ABS: 0.4 10*3/uL — AB (ref 0.7–4.0)
Lymphocytes Relative: 17 %
MCH: 34.9 pg — ABNORMAL HIGH (ref 26.0–34.0)
MCHC: 34.3 g/dL (ref 30.0–36.0)
MCV: 101.5 fL — ABNORMAL HIGH (ref 78.0–100.0)
MONOS PCT: 3 %
Monocytes Absolute: 0.1 10*3/uL (ref 0.1–1.0)
Neutro Abs: 2.1 10*3/uL (ref 1.7–7.7)
Neutrophils Relative %: 80 %
Platelets: 243 10*3/uL (ref 150–400)
RBC: 3.27 MIL/uL — ABNORMAL LOW (ref 3.87–5.11)
RDW: 14.8 % (ref 11.5–15.5)
WBC: 2.6 10*3/uL — AB (ref 4.0–10.5)

## 2015-10-06 LAB — GLUCOSE, CAPILLARY
GLUCOSE-CAPILLARY: 280 mg/dL — AB (ref 65–99)
GLUCOSE-CAPILLARY: 296 mg/dL — AB (ref 65–99)
Glucose-Capillary: 236 mg/dL — ABNORMAL HIGH (ref 65–99)

## 2015-10-06 LAB — PROTIME-INR
INR: 1.13 (ref 0.00–1.49)
PROTHROMBIN TIME: 14.7 s (ref 11.6–15.2)

## 2015-10-06 LAB — MRSA PCR SCREENING: MRSA by PCR: NEGATIVE

## 2015-10-06 LAB — BASIC METABOLIC PANEL
Anion gap: 11 (ref 5–15)
BUN: 14 mg/dL (ref 6–20)
CO2: 24 mmol/L (ref 22–32)
CREATININE: 0.82 mg/dL (ref 0.44–1.00)
Calcium: 8.8 mg/dL — ABNORMAL LOW (ref 8.9–10.3)
Chloride: 95 mmol/L — ABNORMAL LOW (ref 101–111)
Glucose, Bld: 528 mg/dL — ABNORMAL HIGH (ref 65–99)
Potassium: 3.8 mmol/L (ref 3.5–5.1)
Sodium: 130 mmol/L — ABNORMAL LOW (ref 135–145)

## 2015-10-06 LAB — CBG MONITORING, ED: Glucose-Capillary: 391 mg/dL — ABNORMAL HIGH (ref 65–99)

## 2015-10-06 LAB — PROCALCITONIN: Procalcitonin: <0.1 ng/mL

## 2015-10-06 LAB — CORTISOL-AM, BLOOD: CORTISOL - AM: 8.4 ug/dL (ref 6.7–22.6)

## 2015-10-06 LAB — APTT: aPTT: 22 seconds — ABNORMAL LOW (ref 24–37)

## 2015-10-06 MED ORDER — DEXAMETHASONE SODIUM PHOSPHATE 4 MG/ML IJ SOLN
4.0000 mg | Freq: Four times a day (QID) | INTRAMUSCULAR | Status: DC
  Administered 2015-10-06 – 2015-10-08 (×8): 4 mg via INTRAVENOUS
  Filled 2015-10-06 (×11): qty 1

## 2015-10-06 MED ORDER — SODIUM CHLORIDE 0.9 % IV BOLUS (SEPSIS)
2000.0000 mL | Freq: Once | INTRAVENOUS | Status: AC
  Administered 2015-10-06: 1000 mL via INTRAVENOUS

## 2015-10-06 MED ORDER — LORAZEPAM 2 MG/ML IJ SOLN
2.0000 mg | Freq: Once | INTRAMUSCULAR | Status: AC
  Administered 2015-10-06: 2 mg via INTRAVENOUS
  Filled 2015-10-06: qty 1

## 2015-10-06 MED ORDER — INSULIN ASPART 100 UNIT/ML ~~LOC~~ SOLN
6.0000 [IU] | Freq: Once | SUBCUTANEOUS | Status: AC
  Administered 2015-10-06: 6 [IU] via INTRAVENOUS
  Filled 2015-10-06: qty 1

## 2015-10-06 MED ORDER — LORAZEPAM 2 MG/ML IJ SOLN: 2.0000 mg | Freq: Once | INTRAMUSCULAR | Status: DC

## 2015-10-06 MED ORDER — ACETAMINOPHEN 325 MG PO TABS
650.0000 mg | ORAL_TABLET | Freq: Four times a day (QID) | ORAL | Status: DC | PRN
  Administered 2015-10-09 (×2): 650 mg via ORAL
  Filled 2015-10-06 (×2): qty 2

## 2015-10-06 MED ORDER — OXYCODONE HCL 5 MG PO TABS: 5.0000 mg | ORAL_TABLET | Freq: Four times a day (QID) | ORAL | Status: DC | PRN

## 2015-10-06 MED ORDER — HEPARIN SODIUM (PORCINE) 5000 UNIT/ML IJ SOLN
5000.0000 [IU] | Freq: Three times a day (TID) | INTRAMUSCULAR | Status: DC
  Administered 2015-10-06 – 2015-10-07 (×4): 5000 [IU] via SUBCUTANEOUS
  Filled 2015-10-06 (×8): qty 1

## 2015-10-06 MED ORDER — VANCOMYCIN HCL IN DEXTROSE 1-5 GM/200ML-% IV SOLN
1000.0000 mg | Freq: Two times a day (BID) | INTRAVENOUS | Status: DC
  Administered 2015-10-06 – 2015-10-07 (×3): 1000 mg via INTRAVENOUS
  Filled 2015-10-06 (×4): qty 200

## 2015-10-06 MED ORDER — VITAMIN B-6 100 MG PO TABS
200.0000 mg | ORAL_TABLET | Freq: Every day | ORAL | Status: DC
  Filled 2015-10-06: qty 2

## 2015-10-06 MED ORDER — LORAZEPAM 2 MG/ML IJ SOLN: 1.0000 mg | Freq: Once | INTRAMUSCULAR | Status: DC

## 2015-10-06 MED ORDER — HYDROCORTISONE NA SUCCINATE PF 100 MG IJ SOLR
50.0000 mg | Freq: Once | INTRAMUSCULAR | Status: AC
  Administered 2015-10-06: 50 mg via INTRAVENOUS
  Filled 2015-10-06: qty 1

## 2015-10-06 MED ORDER — WHITE PETROLATUM GEL
Status: AC
  Administered 2015-10-06: 0.2
  Filled 2015-10-06: qty 1

## 2015-10-06 MED ORDER — DEXTROSE 5 % IV SOLN
1.0000 g | Freq: Three times a day (TID) | INTRAVENOUS | Status: DC
  Administered 2015-10-06 – 2015-10-07 (×4): 1 g via INTRAVENOUS
  Filled 2015-10-06 (×8): qty 1

## 2015-10-06 MED ORDER — FLUCONAZOLE 100 MG PO TABS
100.0000 mg | ORAL_TABLET | Freq: Every day | ORAL | Status: DC
  Filled 2015-10-06: qty 1

## 2015-10-06 MED ORDER — INSULIN ASPART 100 UNIT/ML ~~LOC~~ SOLN
0.0000 [IU] | Freq: Three times a day (TID) | SUBCUTANEOUS | Status: DC
  Administered 2015-10-06: 5 [IU] via SUBCUTANEOUS
  Administered 2015-10-06: 3 [IU] via SUBCUTANEOUS
  Administered 2015-10-07: 2 [IU] via SUBCUTANEOUS
  Administered 2015-10-07: 7 [IU] via SUBCUTANEOUS
  Administered 2015-10-07: 5 [IU] via SUBCUTANEOUS
  Administered 2015-10-08: 1 [IU] via SUBCUTANEOUS
  Administered 2015-10-08: 7 [IU] via SUBCUTANEOUS
  Administered 2015-10-08: 5 [IU] via SUBCUTANEOUS
  Administered 2015-10-09: 3 [IU] via SUBCUTANEOUS
  Administered 2015-10-09: 7 [IU] via SUBCUTANEOUS
  Administered 2015-10-09 – 2015-10-10 (×2): 3 [IU] via SUBCUTANEOUS

## 2015-10-06 MED ORDER — LORAZEPAM 2 MG/ML IJ SOLN
INTRAMUSCULAR | Status: AC
  Filled 2015-10-06: qty 1

## 2015-10-06 MED ORDER — EXEMESTANE 25 MG PO TABS
25.0000 mg | ORAL_TABLET | Freq: Every day | ORAL | Status: DC
  Filled 2015-10-06 (×2): qty 1

## 2015-10-06 MED ORDER — HYDRALAZINE HCL 20 MG/ML IJ SOLN: 5.0000 mg | INTRAMUSCULAR | Status: DC | PRN

## 2015-10-06 MED ORDER — VANCOMYCIN HCL IN DEXTROSE 1-5 GM/200ML-% IV SOLN
1000.0000 mg | Freq: Once | INTRAVENOUS | Status: AC
  Administered 2015-10-06: 1000 mg via INTRAVENOUS
  Filled 2015-10-06: qty 200

## 2015-10-06 MED ORDER — SODIUM CHLORIDE 0.9 % IV SOLN
500.0000 mg | Freq: Two times a day (BID) | INTRAVENOUS | Status: DC
  Administered 2015-10-06 – 2015-10-08 (×5): 500 mg via INTRAVENOUS
  Filled 2015-10-06 (×7): qty 5

## 2015-10-06 MED ORDER — WHITE PETROLATUM GEL
Status: AC
  Administered 2015-10-06: 13:00:00
  Filled 2015-10-06: qty 1

## 2015-10-06 MED ORDER — ONDANSETRON HCL 4 MG/2ML IJ SOLN: 4.0000 mg | Freq: Four times a day (QID) | INTRAMUSCULAR | Status: DC | PRN

## 2015-10-06 MED ORDER — ONDANSETRON HCL 4 MG PO TABS
4.0000 mg | ORAL_TABLET | Freq: Four times a day (QID) | ORAL | Status: DC | PRN
  Administered 2015-10-08: 4 mg via ORAL
  Filled 2015-10-06: qty 1

## 2015-10-06 MED ORDER — PANTOPRAZOLE SODIUM 40 MG PO TBEC
40.0000 mg | DELAYED_RELEASE_TABLET | Freq: Two times a day (BID) | ORAL | Status: DC
  Administered 2015-10-07 – 2015-10-10 (×6): 40 mg via ORAL
  Filled 2015-10-06 (×6): qty 1

## 2015-10-06 MED ORDER — DEXAMETHASONE 4 MG PO TABS
4.0000 mg | ORAL_TABLET | Freq: Two times a day (BID) | ORAL | Status: DC
  Filled 2015-10-06 (×3): qty 1

## 2015-10-06 MED ORDER — SODIUM CHLORIDE 0.9 % IV SOLN
1000.0000 mg | INTRAVENOUS | Status: AC
  Administered 2015-10-06: 1000 mg via INTRAVENOUS
  Filled 2015-10-06: qty 10

## 2015-10-06 MED ORDER — SODIUM CHLORIDE 0.9 % IV SOLN
INTRAVENOUS | Status: DC
  Administered 2015-10-06: 12:00:00 via INTRAVENOUS
  Administered 2015-10-06: 100 mL/h via INTRAVENOUS
  Administered 2015-10-07 – 2015-10-08 (×2): via INTRAVENOUS

## 2015-10-06 MED ORDER — DEXAMETHASONE SODIUM PHOSPHATE 10 MG/ML IJ SOLN: 10.0000 mg | Freq: Four times a day (QID) | INTRAMUSCULAR | Status: DC

## 2015-10-06 MED ORDER — GABAPENTIN 300 MG PO CAPS
300.0000 mg | ORAL_CAPSULE | Freq: Three times a day (TID) | ORAL | Status: DC
  Filled 2015-10-06 (×3): qty 1

## 2015-10-06 MED ORDER — LORAZEPAM 2 MG/ML IJ SOLN
1.0000 mg | INTRAMUSCULAR | Status: DC | PRN
  Administered 2015-10-06 (×3): 1 mg via INTRAVENOUS
  Filled 2015-10-06 (×3): qty 1

## 2015-10-06 MED ORDER — INSULIN GLARGINE 100 UNIT/ML ~~LOC~~ SOLN
5.0000 [IU] | Freq: Every day | SUBCUTANEOUS | Status: DC
  Administered 2015-10-06 (×2): 5 [IU] via SUBCUTANEOUS
  Filled 2015-10-06 (×4): qty 0.05

## 2015-10-06 MED ORDER — ACETAMINOPHEN 650 MG RE SUPP: 650.0000 mg | Freq: Four times a day (QID) | RECTAL | Status: DC | PRN

## 2015-10-06 NOTE — H&P (Addendum)
[facility] History and Physical  Mary [name] GGY:[id number] DOB: 22-Dec-1956 DOA: 10/05/2015  Referring physician: ED physician PCP: [name], MD  Specialists:   Chief Complaint: seizure  HPI: Mary [name] is a 59 y.o. 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  Per pt's sister, patient has had 4 episodes of seizures since 10:45 PM. Pt's husband noticed that her upper body was shaking, but patient's sister states that patient's whole body was shaking when she came to see pt. Her sister states that patient appeared to be normally responsive after her first seizure, but after pt had 2 more seizures, she became less responsive and confused. She had another seizure en route, per EMS. Patient was given 5 mg of Versed en route by EMS. Patient's sister reports patient also recently had a creamy, fluid-colored drainage from an area on her right breast wound. Patient has been treated with Abx (not know the name of Abx) by her oncologist, [name]. Her sister also notes some neck swelling, although she is unsure whether this is fat. Per her sister, patient does not have fever, chills, cough, shortness breath, chest pain, abdominal pain, diarrhea, symptoms of UTI. She moves all extremities.  In ED, patient was found to have elevated blood sugar at 528 (no hx of DM. Pt is on Decadron), WBC 2.6, temperature normal, no tachycardia, electrolytes okay. Patient is admitted to inpatient for further evaluation and treatment. CT-head showed no new acute intracranial process, multiple parenchymal and dural calcifications corresponding to known metastasis, moderate global brain volume loss, advanced for age though similar to prior MRI, patchy white matter hypodensities may represent chronic small vessel ischemic disease or treatment related changes, acute on  chronic LEFT maxillary sinusitis. Neurology was consulted by ED.  Where does patient live?   At home Can patient participate in ADLs?  None  Review of Systems: could not be obtained due to altered mental status.  Allergy:  Allergies  Allergen Reactions  . Hydrocodone     "makes me crawl on the floor"  . Penicillins Swelling    Per pt's sister, swelling of throat  . Aspirin Other (See Comments)    Due to ulcers    Past Medical History  Diagnosis Date  . Arthritis   . Depression   . Fainting   . Headache   . Hypertension   . Migraines   . History of blood transfusion   . Gastric ulcer   . Breast cancer metastasized to multiple sites [facility]) 04/12/2014    Past Surgical History  Procedure Laterality Date  . Abdominal hysterectomy  15 years go    partial    Social History:  reports that she has never smoked. She has never used smokeless tobacco. She reports that she does not drink alcohol or use illicit drugs.  Family History:  Family History  Problem Relation Age of Onset  . Arthritis Other   . Hypertension Mother   . Hypertension Other   . Heart disease Other   . Stroke Father   . Cancer Sister     leukemia     Prior to Admission medications   Medication Sig Start Date End Date Taking? Authorizing Provider  dexamethasone (DECADRON) 4 MG tablet Take 1 tablet (4 mg total) by mouth 2 (two) times daily with a meal. 08/15/15  Yes Tyler Pita, MD  exemestane (AROMASIN) 25 MG  tablet Take 1 tablet (25 mg total) by mouth daily after breakfast. 08/28/15  Yes Volanda Napoleon, MD  fluconazole (DIFLUCAN) 100 MG tablet Take 1 tablet (100 mg total) by mouth daily. 06/19/15  Yes Volanda Napoleon, MD  gabapentin (NEURONTIN) 300 MG capsule Take 1 capsule (300 mg total) by mouth 3 (three) times daily. 08/28/15  Yes Volanda Napoleon, MD  oxyCODONE (OXY IR/ROXICODONE) 5 MG immediate release tablet Take 1-3 tablets (5-15 mg total) by mouth every 6 (six) hours as needed for severe pain.  Take 1-2 if needed for pain due to cancer. 09/24/15  Yes Volanda Napoleon, MD  palbociclib Leslee Home) 125 MG capsule Take whole with food every day for 21 days and then off for 7 days 08/28/15  Yes Volanda Napoleon, MD  amLODipine (NORVASC) 5 MG tablet Take 1 tablet (5 mg total) by mouth every morning. Patient not taking: Reported on 10/06/2015 05/09/15   Eliezer Bottom, NP  chlorhexidine (PERIDEX) 0.12 % solution Rinse with 15 mls three times a day after breakfast, lunch and at bedtime. Spit out excess. Do not swallow. Patient not taking: Reported on 10/06/2015 07/29/15   Lenn Cal, DDS  dexamethasone (DECADRON) 4 MG tablet Take 1 tablet (4 mg total) by mouth 2 (two) times daily with a meal. 08/15/15   Tyler Pita, MD  dronabinol (MARINOL) 2.5 MG capsule Take 1 capsule (2.5 mg total) by mouth 2 (two) times daily before a meal. Patient not taking: Reported on 10/06/2015 05/09/15   Eliezer Bottom, NP  escitalopram (LEXAPRO) 20 MG tablet Take 1 tablet (20 mg total) by mouth every morning. Patient not taking: Reported on 10/06/2015 06/19/15   Volanda Napoleon, MD  lisinopril (PRINIVIL,ZESTRIL) 40 MG tablet Take 1 tablet (40 mg total) by mouth every morning. Patient not taking: Reported on 10/06/2015 05/09/15   Eliezer Bottom, NP  LORazepam (ATIVAN) 0.5 MG tablet Take 1 tablet (0.5 mg total) by mouth every 6 (six) hours as needed (Nausea or vomiting). Patient not taking: Reported on 10/06/2015 05/09/15   Eliezer Bottom, NP  ondansetron (ZOFRAN) 8 MG tablet Take 1 tablet (8 mg total) by mouth 2 (two) times daily. Start the day after chemo for 3 days. Then take as needed for nausea or vomiting. 02/21/15   Volanda Napoleon, MD  pantoprazole (PROTONIX) 40 MG tablet Take 1 tablet (40 mg total) by mouth 2 (two) times daily. Patient not taking: Reported on 10/06/2015 05/09/15   Eliezer Bottom, NP  potassium chloride SA (K-DUR,KLOR-CON) 20 MEQ tablet Take 1 tablet (20 mEq total) by mouth  daily. Patient not taking: Reported on 10/06/2015 06/19/15   Volanda Napoleon, MD  prochlorperazine (COMPAZINE) 10 MG tablet Take 1 tablet (10 mg total) by mouth every 6 (six) hours as needed for nausea or vomiting. Patient not taking: Reported on 10/06/2015 06/19/15   Volanda Napoleon, MD  pyridOXINE (VITAMIN B-6) 100 MG tablet Take 2 tablets (200 mg total) by mouth daily. Patient not taking: Reported on 10/06/2015 03/14/15   Eliezer Bottom, NP  sucralfate (CARAFATE) 1 GM/10ML suspension Take 10 mLs (1 g total) by mouth 4 (four) times daily -  with meals and at bedtime. Patient not taking: Reported on 10/06/2015 06/19/15   Volanda Napoleon, MD  SUMAtriptan (IMITREX) 100 MG tablet Take 1 tablet (100 mg total) by mouth every 2 (two) hours as needed for migraine. Patient not taking: Reported on 10/06/2015 01/14/15   Holli Humbles  [place], NP    Physical Exam: Filed Vitals:   10/06/15 0002 10/06/15 0130 10/06/15 0145 10/06/15 0200  BP: 124/80 134/69 134/94   Pulse: 93 91 92   Temp: 97.9 F (36.6 C)     TempSrc: Axillary     Resp: 21 14    Height:    5\' 2"  (1.575 m)  Weight:    73.5 kg (162 lb 0.6 oz)  SpO2: 100% 96% 93%    General: Not in acute distress. Dry mucus and membrane. HEENT:       Eyes: PERRL, EOMI, no scleral icterus.       ENT: No discharge from the ears and nose, no pharynx injection, no tonsillar enlargement.        Neck: No JVD, no bruit, no mass felt. Heme: No neck lymph node enlargement. Cardiac: S1/S2, RRR, No murmurs, No gallops or rubs. Pulm: No rales, wheezing, rhonchi or rubs. There is Port-A line over L upper chest with clean surrounding. Abd: Soft, nondistended, nontender, no rebound pain, no organomegaly, BS present. Ext: No pitting leg edema bilaterally. 2+DP/PT pulse bilaterally. Musculoskeletal: No joint deformities, No joint redness or warmth, no limitation of ROM in spin. Skin: No rashes. There is an open wound over lateral side of R breast with yellow color  discharge, approximately 5 cm in size. Neuro: post ictal status not oriented X3, cranial nerves II-XII grossly intact, moves all extremities, Negative Babinski's sign. Psych: could not be assessed due to altered mental status.  Labs on Admission:  Basic Metabolic Panel:  Recent Labs Lab 10/06/15 0108  NA 130*  K 3.8  CL 95*  CO2 24  GLUCOSE 528*  BUN 14  CREATININE 0.82  CALCIUM 8.8*   Liver Function Tests: No results for input(s): AST, ALT, ALKPHOS, BILITOT, PROT, ALBUMIN in the last 168 hours. No results for input(s): LIPASE, AMYLASE in the last 168 hours. No results for input(s): AMMONIA in the last 168 hours. CBC:  Recent Labs Lab 10/06/15 0108  WBC 2.6*  NEUTROABS 2.1  HGB 11.4*  HCT 33.2*  MCV 101.5*  PLT 243   Cardiac Enzymes: No results for input(s): CKTOTAL, CKMB, CKMBINDEX, TROPONINI in the last 168 hours.  BNP (last 3 results) No results for input(s): BNP in the last 8760 hours.  ProBNP (last 3 results) No results for input(s): PROBNP in the last 8760 hours.  CBG:  Recent Labs Lab 10/06/15 0246  GLUCAP 391*    Radiological Exams on Admission: Ct Head Wo Contrast  10/06/2015   CLINICAL DATA:  Multiple seizures tonight. Known metastatic breast cancer to the brain. History of hypertension, migraines and headaches.  EXAM: CT HEAD WITHOUT CONTRAST  TECHNIQUE: Contiguous axial images were obtained from the base of the skull through the vertex without intravenous contrast.  COMPARISON:  MRI of the brain with and without contrast May 09, 2015  FINDINGS: No intraparenchymal hemorrhage, mass effect, midline shift or acute large vascular territory infarcts. Scattered parenchymal calcifications, corresponding to metastasis on prior MRI. Moderate ventriculomegaly on the basis of global parenchymal brain volume loss, similar to prior imaging. Patchy supratentorial white matter hypodensities. No abnormal extra-axial fluid collections. Basal cisterns are patent.   Ocular globes and orbital contents are unremarkable. LEFT maxillary sinus mucosal thickening with air-fluid level, mild LEFT ethmoid mucosal thickening. The mastoid air cells are well aerated. No skull fracture. Scattered dural calcifications, some which correspond to metastasis on prior MRI.  IMPRESSION: No acute intracranial process.  Multiple parenchymal and dural calcifications  corresponding to known metastasis.  Moderate global brain volume loss, advanced for age though similar to prior MRI. Patchy white matter hypodensities may represent chronic small vessel ischemic disease or treatment related changes.  Acute on chronic LEFT maxillary sinusitis.   Electronically Signed   By: Elon Alas M.D.   On: 10/06/2015 01:05    EKG: Independently reviewed.  Abnormal findings: QTC 469, nonspecific T-wave change Assessment/Plan Principal Problem:   Seizure (HCC) Active Problems:   Hypertension   Migraine headache   Protein-calorie malnutrition, severe (HCC)   Breast cancer metastasized to multiple sites [facility])   Wound of right breast   Hyperglycemia   Wound infection ([place])  Seizure ([place]): most likely due to metastasized breast cancer to brain. It is likely triggered by hypoglycemia, dehydration and infection over right breast wound. Other differential diagnosis includes meningitis, but patient does not have meningeal sign are less likely to have meningitis.   -will admit to SUD -frequent neuro check -Continue Decadron.  -Give stress dose of Solu-Cortef, 50 mg x 1 -Check cortisol level -IV keppar and prn ativan -f/u neurology recommendations  Steroid-induced hyperglycemia: Patient does not have history of diabetes mellitus. Her blood sugar is 528. She is clinically dehydrated. This is most likely due to Decadron use. She received NovoLog 6 units emergency room. -will start lantus 5 units daily -SSI -A1c -IVF: NS bolus 2L and then 100 cc/h  Hypertension: Patient is not taking oral  medications at home. Blood pressure is 134/69. -IV hydralazine when necessary  Wound infection in R breast [facility]): patient has been having open wound over lateral right breast for almost one year. Recently infected, currently being treated with antibiotics by her oncologist per her sister -Start IV vancomycin and aztreonam -will get Procalcitonin and trend lactic acid levels  -IVF: 2L of NS bolus in ED, followed by 100 cc/h  -consult to wound care team  Breast cancer metastasized to multiple sites [facility]): Patient has been treated by [name], last seen was on 07/31/15. Currently patient is on Exemestane and Palbociclib. -continue home meds -f/u with [name] -continue decadron for brain metastasis.   DVT ppx: SQ Heparin  Code Status: Full code Family Communication: Yes, patient's Sister and a niece  at bed side Disposition Plan: Admit to inpatient   Date of Service 10/06/2015    Ivor Costa [facility] Pager [phone]  If 7PM-7AM, please contact night-coverage [email or web address] Password [email or web address] 10/06/2015, 3:16 AM

## 2015-10-06 NOTE — Consult Note (Signed)
WOC wound consult note Reason for Consult: Right breast lesion Wound type: Neoplastic Pressure Ulcer POA: No Measurement: 3cm x 5cm x 0.5cm Wound bed: 40% necrotic, 60% pink. Appears significantly cleaner at this time than noted in photo taken upon admission in ED Drainage (amount, consistency, odor) Light yellow, no odor, small amount Periwound: Intact with evidence of previous skin peeling that reveals newly reepithelialized tissue Dressing procedure/placement/frequency: The wound is improving in appearance with a few routine applications of appropriately placed saline dressings; I will continue that in this setting in an attempt to remove necrotic tissue via autolysis (moisture retentive dressings) versus employing an enzymatic debriding agent. Given her past history of medical adhesive related skin injury ([place]) per the bedside RN and her niece present today, I will ask Nursing to perform this wound care using paper tape instead of the more adherent Medipore tape. Patient is restless and protective mittens are in use to prevent the inadvertent disruption of a line. She is wearing incontinence briefs and I have asked Nursing to remove these in favor of timely incontinence care using our house skin care products and our Derm[facility] underpads. [place] nursing team will not follow, but will remain available to this patient, the nursing and medical teams.  Please re-consult if needed. Thanks, Maudie Flakes, MSN, RN, [place], [place], [place] [phone])

## 2015-10-06 NOTE — Progress Notes (Signed)
ANTIBIOTIC CONSULT NOTE - INITIAL  Pharmacy Consult for Vancocin and Azactam Indication: wound infection  Allergies  Allergen Reactions  . Hydrocodone     "makes me crawl on the floor"  . Penicillins Swelling    Per pt's sister, swelling of throat  . Aspirin Other (See Comments)    Due to ulcers    Patient Measurements: Height: 5\' 2"  (157.5 cm) Weight: 162 lb 0.6 oz (73.5 kg) IBW/kg (Calculated) : 50.1  Vital Signs: Temp: 97.9 F (36.6 C) (10/09 0002) Temp Source: Axillary (10/09 0002) BP: 134/94 mmHg (10/09 0145) Pulse Rate: 92 (10/09 0145)  Labs:  Recent Labs  10/06/15 0108  WBC 2.6*  HGB 11.4*  PLT 243  CREATININE 0.82   Estimated Creatinine Clearance: 69.4 mL/min (by C-G formula based on Cr of 0.82).   Medical History: Past Medical History  Diagnosis Date  . Arthritis   . Depression   . Fainting   . Headache   . Hypertension   . Migraines   . History of blood transfusion   . Gastric ulcer   . Breast cancer metastasized to multiple sites [facility]) 04/12/2014      Assessment: 59yo female w/ breast cancer and brain mets presents w/ sz, spoke w/ MD and EDP, apparently difficult to discern whether sz 2/2 mets vs another etiology, may have breast wound infection (prior notes from onc mention that there has been bleeding from the breast tumor), to begin IV ABX; awaiting neuro consult.  Goal of Therapy:  Vancomycin trough level 15-20 mcg/ml  Plan:  Will begin vancomycin 1000mg  IV Q12H and Azactam 1g IV Q8H and monitor CBC, Cx, levels prn.  Wynona Neat, PharmD, BCPS  10/06/2015,3:03 AM

## 2015-10-06 NOTE — Consult Note (Signed)
Admission H&P    Chief Complaint: New onset generalized seizures.  HPI: Mary [name] is an 59 y.o. female with a history of metastatic breast cancer to the brain, hypertension, arthritis and migraine headaches, brought to the emergency room following a witnessed generalized seizure activity at home. Patient had a subsequent seizure in route to the hospital. She was given Versed IV prior to arriving in the ED and subsequently given lorazepam. CT scan of her head showed no acute intracranial abnormality. Multiple parenchymal and dural areas of calcification were noted and corresponded to known areas of metastasis. She has no previous history of injury or activity. She was given a loading dose of Keppra 1000 mg IV. No further seizure activity was reported.  Past Medical History  Diagnosis Date  . Arthritis   . Depression   . Fainting   . Headache   . Hypertension   . Migraines   . History of blood transfusion   . Gastric ulcer   . Breast cancer metastasized to multiple sites [facility]) 04/12/2014    Past Surgical History  Procedure Laterality Date  . Abdominal hysterectomy  15 years go    partial    Family History  Problem Relation Age of Onset  . Arthritis Other   . Hypertension Mother   . Hypertension Other   . Heart disease Other   . Stroke Father   . Cancer Sister     leukemia   Social History:  reports that she has never smoked. She has never used smokeless tobacco. She reports that she does not drink alcohol or use illicit drugs.  Allergies:  Allergies  Allergen Reactions  . Hydrocodone     "makes me crawl on the floor"  . Penicillins Swelling    Per pt's sister, swelling of throat  . Aspirin Other (See Comments)    Due to ulcers    Medications Prior to Admission  Medication Sig Dispense Refill  . dexamethasone (DECADRON) 4 MG tablet Take 1 tablet (4 mg total) by mouth 2 (two) times daily with a meal. 60 tablet 2  . exemestane (AROMASIN) 25 MG tablet Take 1 tablet  (25 mg total) by mouth daily after breakfast. 30 tablet 6  . fluconazole (DIFLUCAN) 100 MG tablet Take 1 tablet (100 mg total) by mouth daily. 30 tablet 4  . gabapentin (NEURONTIN) 300 MG capsule Take 1 capsule (300 mg total) by mouth 3 (three) times daily. 90 capsule 4  . oxyCODONE (OXY IR/ROXICODONE) 5 MG immediate release tablet Take 1-3 tablets (5-15 mg total) by mouth every 6 (six) hours as needed for severe pain. Take 1-2 if needed for pain due to cancer. 240 tablet 0  . palbociclib (IBRANCE) 125 MG capsule Take whole with food every day for 21 days and then off for 7 days 21 capsule 4  . amLODipine (NORVASC) 5 MG tablet Take 1 tablet (5 mg total) by mouth every morning. (Patient not taking: Reported on 10/06/2015) 30 tablet 4  . chlorhexidine (PERIDEX) 0.12 % solution Rinse with 15 mls three times a day after breakfast, lunch and at bedtime. Spit out excess. Do not swallow. (Patient not taking: Reported on 10/06/2015) 1440 mL prn  . dexamethasone (DECADRON) 4 MG tablet Take 1 tablet (4 mg total) by mouth 2 (two) times daily with a meal. 60 tablet 2  . dronabinol (MARINOL) 2.5 MG capsule Take 1 capsule (2.5 mg total) by mouth 2 (two) times daily before a meal. (Patient not taking: Reported on 10/06/2015)  60 capsule 3  . escitalopram (LEXAPRO) 20 MG tablet Take 1 tablet (20 mg total) by mouth every morning. (Patient not taking: Reported on 10/06/2015) 30 tablet 4  . lisinopril (PRINIVIL,ZESTRIL) 40 MG tablet Take 1 tablet (40 mg total) by mouth every morning. (Patient not taking: Reported on 10/06/2015) 30 tablet 4  . LORazepam (ATIVAN) 0.5 MG tablet Take 1 tablet (0.5 mg total) by mouth every 6 (six) hours as needed (Nausea or vomiting). (Patient not taking: Reported on 10/06/2015) 30 tablet 0  . ondansetron (ZOFRAN) 8 MG tablet Take 1 tablet (8 mg total) by mouth 2 (two) times daily. Start the day after chemo for 3 days. Then take as needed for nausea or vomiting. 30 tablet 1  . pantoprazole (PROTONIX)  40 MG tablet Take 1 tablet (40 mg total) by mouth 2 (two) times daily. (Patient not taking: Reported on 10/06/2015) 60 tablet 4  . potassium chloride SA (K-DUR,KLOR-CON) 20 MEQ tablet Take 1 tablet (20 mEq total) by mouth daily. (Patient not taking: Reported on 10/06/2015) 30 tablet 3  . prochlorperazine (COMPAZINE) 10 MG tablet Take 1 tablet (10 mg total) by mouth every 6 (six) hours as needed for nausea or vomiting. (Patient not taking: Reported on 10/06/2015) 120 tablet 3  . pyridOXINE (VITAMIN B-6) 100 MG tablet Take 2 tablets (200 mg total) by mouth daily. (Patient not taking: Reported on 10/06/2015) 60 tablet 11  . sucralfate (CARAFATE) 1 GM/10ML suspension Take 10 mLs (1 g total) by mouth 4 (four) times daily -  with meals and at bedtime. (Patient not taking: Reported on 10/06/2015) 420 mL 3  . SUMAtriptan (IMITREX) 100 MG tablet Take 1 tablet (100 mg total) by mouth every 2 (two) hours as needed for migraine. (Patient not taking: Reported on 10/06/2015) 10 tablet 3    ROS: History obtained from patient spouse and sister.  General ROS: negative for - chills, fatigue, fever, night sweats, weight gain or weight loss Psychological ROS: negative for - behavioral disorder, hallucinations, memory difficulties, mood swings or suicidal ideation Ophthalmic ROS: negative for - blurry vision, double vision, eye pain or loss of vision ENT ROS: negative for - epistaxis, nasal discharge, oral lesions, sore throat, tinnitus or vertigo Allergy and Immunology ROS: negative for - hives or itchy/watery eyes Hematological and Lymphatic ROS: negative for - bleeding problems, bruising or swollen lymph nodes Endocrine ROS: negative for - galactorrhea, hair pattern changes, polydipsia/polyuria or temperature intolerance Respiratory ROS: negative for - cough, hemoptysis, shortness of breath or wheezing Cardiovascular ROS: negative for - chest pain, dyspnea on exertion, edema or irregular heartbeat Gastrointestinal ROS:  negative for - abdominal pain, diarrhea, hematemesis, nausea/vomiting or stool incontinence Genito-Urinary ROS: negative for - dysuria, hematuria, incontinence or urinary frequency/urgency Musculoskeletal ROS: negative for - joint swelling or muscular weakness Neurological ROS: as noted in HPI Dermatological ROS: negative for rash and skin lesion changes  Physical Examination: Blood pressure 150/94, pulse 98, temperature 97.9 F (36.6 C), temperature source Axillary, resp. rate 16, height 5' 2"  (1.575 m), weight 73.5 kg (162 lb 0.6 oz), SpO2 95 %.  HEENT-  Normocephalic, no lesions, without obvious abnormality.  Normal external eye and conjunctiva.  Normal TM's bilaterally.  Normal auditory canals and external ears. Normal external nose, mucus membranes and septum.  Normal pharynx. Neck supple with no masses, nodes, nodules or enlargement. Cardiovascular - regular rate and rhythm, S1, S2 normal, no murmur, click, rub or gallop Lungs - chest clear, no wheezing, rales, normal symmetric air entry  Abdomen - soft, non-tender; bowel sounds normal; no masses,  no organomegaly Extremities - no joint deformities, effusion, or inflammation and no edema  Neurologic Examination: Patient was somnolent and moderately difficult to arouse. She was agitated when aroused. She had no intelligible speech output, commensurate with her level of alertness. Pupils were equal and reacted normally to light. Extraocular movements were full and conjugate on right left lateral gaze. Face was symmetrical with no signs of focal weakness. Strength of extremities was normal and equal with withdrawal movements to tactile and noxious stimuli. Muscle tone was flaccid throughout at rest. Deep tendon reflexes were trace only and symmetrical in upper extremities and absent in lower extremities. Plantar responses were flexor.  Results for orders placed or performed during the hospital encounter of 10/05/15 (from the past 48  hour(s))  CBC with Differential/Platelet     Status: Abnormal   Collection Time: 10/06/15  1:08 AM  Result Value Ref Range   WBC 2.6 (L) 4.0 - 10.5 K/uL   RBC 3.27 (L) 3.87 - 5.11 MIL/uL   Hemoglobin 11.4 (L) 12.0 - 15.0 g/dL   HCT 33.2 (L) 36.0 - 46.0 %   MCV 101.5 (H) 78.0 - 100.0 fL   MCH 34.9 (H) 26.0 - 34.0 pg   MCHC 34.3 30.0 - 36.0 g/dL   RDW 14.8 11.5 - 15.5 %   Platelets 243 150 - 400 K/uL   Neutrophils Relative % 80 %   Lymphocytes Relative 17 %   Monocytes Relative 3 %   Eosinophils Relative 0 %   Basophils Relative 0 %   Neutro Abs 2.1 1.7 - 7.7 K/uL   Lymphs Abs 0.4 (L) 0.7 - 4.0 K/uL   Monocytes Absolute 0.1 0.1 - 1.0 K/uL   Eosinophils Absolute 0.0 0.0 - 0.7 K/uL   Basophils Absolute 0.0 0.0 - 0.1 K/uL   RBC Morphology RARE NRBCs     Comment: POLYCHROMASIA PRESENT  Basic metabolic panel     Status: Abnormal   Collection Time: 10/06/15  1:08 AM  Result Value Ref Range   Sodium 130 (L) 135 - 145 mmol/L   Potassium 3.8 3.5 - 5.1 mmol/L   Chloride 95 (L) 101 - 111 mmol/L   CO2 24 22 - 32 mmol/L   Glucose, Bld 528 (H) 65 - 99 mg/dL   BUN 14 6 - 20 mg/dL   Creatinine, Ser 0.82 0.44 - 1.00 mg/dL   Calcium 8.8 (L) 8.9 - 10.3 mg/dL   GFR calc non Af Amer >60 >60 mL/min   GFR calc Af Amer >60 >60 mL/min    Comment: (NOTE) The eGFR has been calculated using the CKD EPI equation. This calculation has not been validated in all clinical situations. eGFR's persistently <60 mL/min signify possible Chronic Kidney Disease.    Anion gap 11 5 - 15  POC CBG, ED     Status: Abnormal   Collection Time: 10/06/15  2:46 AM  Result Value Ref Range   Glucose-Capillary 391 (H) 65 - 99 mg/dL   Ct Head Wo Contrast  10/06/2015   CLINICAL DATA:  Multiple seizures tonight. Known metastatic breast cancer to the brain. History of hypertension, migraines and headaches.  EXAM: CT HEAD WITHOUT CONTRAST  TECHNIQUE: Contiguous axial images were obtained from the base of the skull through the  vertex without intravenous contrast.  COMPARISON:  MRI of the brain with and without contrast May 09, 2015  FINDINGS: No intraparenchymal hemorrhage, mass effect, midline shift or acute large vascular  territory infarcts. Scattered parenchymal calcifications, corresponding to metastasis on prior MRI. Moderate ventriculomegaly on the basis of global parenchymal brain volume loss, similar to prior imaging. Patchy supratentorial white matter hypodensities. No abnormal extra-axial fluid collections. Basal cisterns are patent.  Ocular globes and orbital contents are unremarkable. LEFT maxillary sinus mucosal thickening with air-fluid level, mild LEFT ethmoid mucosal thickening. The mastoid air cells are well aerated. No skull fracture. Scattered dural calcifications, some which correspond to metastasis on prior MRI.  IMPRESSION: No acute intracranial process.  Multiple parenchymal and dural calcifications corresponding to known metastasis.  Moderate global brain volume loss, advanced for age though similar to prior MRI. Patchy white matter hypodensities may represent chronic small vessel ischemic disease or treatment related changes.  Acute on chronic LEFT maxillary sinusitis.   Electronically Signed   By: Elon Alas M.D.   On: 10/06/2015 01:05   Dg Chest Port 1 View  10/06/2015   CLINICAL DATA:  Acute onset of seizure.  Initial encounter.  EXAM: PORTABLE CHEST 1 VIEW  COMPARISON:  Chest radiograph performed 04/05/2014, and CT of the chest performed 09/04/2015  FINDINGS: The lungs are well-aerated. There is mild elevation of the left hemidiaphragm. Pulmonary vascularity is at the upper limits of normal. There is no evidence of focal opacification, pleural effusion or pneumothorax.  The cardiomediastinal silhouette is within normal limits. A left-sided chest port is noted ending about the distal SVC. No acute osseous abnormalities are seen.  IMPRESSION: Mild elevation of the left hemidiaphragm.  Lungs otherwise  clear.   Electronically Signed   By: Garald Balding M.D.   On: 10/06/2015 03:16    Assessment/Plan 59 year old lady with history of breast cancer with known metastasis to the brain, presenting with new onset generalized seizures.  Recommendations: 1. Continue Keppra at 500 mg twice a day 2. MRI of the brain without and with contrast to rule out new areas of metastasis 3. EEG deferred, as results would not alter treatment indications  We will continue to follow this patient with you.  C.R. Nicole Kindred, MD [facility] [phone]  10/06/2015, 5:19 AM

## 2015-10-06 NOTE — ED Notes (Signed)
Pt becoming more agitated. Unable to lay still in the bed. Pt tossing and turning attempting to get comfortable. Pt removed cardiac hours, dressing changed on PICC line due to previous bandage being removed accidentally by patient

## 2015-10-06 NOTE — ED Provider Notes (Addendum)
CSN: [id number]     Arrival date & time 10/05/15  2347 History  By signing my name below, I, Mary [name], attest that this documentation has been prepared under the direction and in the presence of Mary Biles, MD. Electronically Signed: Stephania [name], ED Scribe. 10/06/2015. 4:43 AM.   Chief Complaint  Patient presents with  . Seizures   The history is provided by a relative. The history is limited by the condition of the patient. No language interpreter was used.   HPI Comments: Mary [name] is a 59 y.o. 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 Her sister states patient's husband had called her because he saw that her upper body was shaking, but when her sister had come over, patient's whole body was shaking. Her sister states that patient appeared to be normally responsive after her first seizure, but after the second and third seizure, she became less responsive post-ictally. She had another seizure en route, per EMS. Patient was given 5 mg of Versed en route by EMS.   Patient's sister reports patient also recently had a creamy, fluid-colored drainage from an area on her right breast. Her sister also notes some neck swelling, although she is unsure whether this is fat. Sister reports patient has Stage IV breast cancer, followed by [name], who told patient's family that the cancer was treatable if patient is compliant with her medications. She is scheduled to see a neurologist in [facility] in 4 days.   Patient lives with her husband, but she lives nearby her sister. Patient's husband acts as her power of attorney. Patient has known allergies to penicillin, which sister states causes her throat to swell up.   Past Medical History  Diagnosis Date  . Arthritis   . Depression   . Fainting   .  Headache   . Hypertension   . Migraines   . History of blood transfusion   . Gastric ulcer   . Breast cancer metastasized to multiple sites [facility]) 04/12/2014   Past Surgical History  Procedure Laterality Date  . Abdominal hysterectomy  15 years go    partial   Family History  Problem Relation Age of Onset  . Arthritis Other   . Hypertension Mother   . Hypertension Other   . Heart disease Other   . Stroke Father   . Cancer Sister     leukemia   Social History  Substance Use Topics  . Smoking status: Never Smoker   . Smokeless tobacco: Never Used     Comment: never used tobacco  . Alcohol Use: No   OB History    No data available     Review of Systems  Unable to perform ROS: Other    Allergies  Hydrocodone; Penicillins; and Aspirin  Home Medications   Prior to Admission medications   Medication Sig Start Date End Date Taking? Authorizing Provider  dexamethasone (DECADRON) 4 MG tablet Take 1 tablet (4 mg total) by mouth 2 (two) times daily with a meal. 08/15/15  Yes Tyler Pita, MD  exemestane (AROMASIN) 25 MG tablet Take 1 tablet (25 mg total) by mouth daily after breakfast. 08/28/15  Yes Volanda Napoleon, MD  fluconazole (DIFLUCAN) 100 MG tablet Take 1 tablet (100 mg total) by mouth daily. 06/19/15  Yes Volanda Napoleon, MD  gabapentin (NEURONTIN) 300 MG capsule Take 1 capsule (300 mg total) by mouth 3 (three) times daily. 08/28/15  Yes Volanda Napoleon, MD  oxyCODONE (OXY IR/ROXICODONE) 5 MG immediate release tablet Take 1-3 tablets (5-15 mg total) by mouth every 6 (six) hours as needed for severe pain. Take 1-2 if needed for pain due to cancer. 09/24/15  Yes Volanda Napoleon, MD  palbociclib Leslee Home) 125 MG capsule Take whole with food every day for 21 days and then off for 7 days 08/28/15  Yes Volanda Napoleon, MD  amLODipine (NORVASC) 5 MG tablet Take 1 tablet (5 mg total) by mouth every morning. Patient not taking: Reported on 10/06/2015 05/09/15   Eliezer Bottom, NP   chlorhexidine (PERIDEX) 0.12 % solution Rinse with 15 mls three times a day after breakfast, lunch and at bedtime. Spit out excess. Do not swallow. Patient not taking: Reported on 10/06/2015 07/29/15   Lenn Cal, DDS  dexamethasone (DECADRON) 4 MG tablet Take 1 tablet (4 mg total) by mouth 2 (two) times daily with a meal. 08/15/15   Tyler Pita, MD  dronabinol (MARINOL) 2.5 MG capsule Take 1 capsule (2.5 mg total) by mouth 2 (two) times daily before a meal. Patient not taking: Reported on 10/06/2015 05/09/15   Eliezer Bottom, NP  escitalopram (LEXAPRO) 20 MG tablet Take 1 tablet (20 mg total) by mouth every morning. Patient not taking: Reported on 10/06/2015 06/19/15   Volanda Napoleon, MD  lisinopril (PRINIVIL,ZESTRIL) 40 MG tablet Take 1 tablet (40 mg total) by mouth every morning. Patient not taking: Reported on 10/06/2015 05/09/15   Eliezer Bottom, NP  LORazepam (ATIVAN) 0.5 MG tablet Take 1 tablet (0.5 mg total) by mouth every 6 (six) hours as needed (Nausea or vomiting). Patient not taking: Reported on 10/06/2015 05/09/15   Eliezer Bottom, NP  ondansetron (ZOFRAN) 8 MG tablet Take 1 tablet (8 mg total) by mouth 2 (two) times daily. Start the day after chemo for 3 days. Then take as needed for nausea or vomiting. 02/21/15   Volanda Napoleon, MD  pantoprazole (PROTONIX) 40 MG tablet Take 1 tablet (40 mg total) by mouth 2 (two) times daily. Patient not taking: Reported on 10/06/2015 05/09/15   Eliezer Bottom, NP  potassium chloride SA (K-DUR,KLOR-CON) 20 MEQ tablet Take 1 tablet (20 mEq total) by mouth daily. Patient not taking: Reported on 10/06/2015 06/19/15   Volanda Napoleon, MD  prochlorperazine (COMPAZINE) 10 MG tablet Take 1 tablet (10 mg total) by mouth every 6 (six) hours as needed for nausea or vomiting. Patient not taking: Reported on 10/06/2015 06/19/15   Volanda Napoleon, MD  pyridOXINE (VITAMIN B-6) 100 MG tablet Take 2 tablets (200 mg total) by mouth daily. Patient not  taking: Reported on 10/06/2015 03/14/15   Eliezer Bottom, NP  sucralfate (CARAFATE) 1 GM/10ML suspension Take 10 mLs (1 g total) by mouth 4 (four) times daily -  with meals and at bedtime. Patient not taking: Reported on 10/06/2015 06/19/15   Volanda Napoleon, MD  SUMAtriptan (IMITREX) 100 MG tablet Take 1 tablet (100 mg total) by mouth every 2 (two) hours as needed for migraine. Patient not taking: Reported on 10/06/2015 01/14/15   Eliezer Bottom, NP   BP 144/86 mmHg  Pulse 77  Temp(Src) 98.1 F (36.7 C) (Oral)  Resp 19  Ht 5\' 2"  (1.575 m)  Wt 153 lb 3.5 oz (69.5 kg)  BMI 28.02 kg/m2  SpO2 100% Physical  Exam  Constitutional: She is oriented to person, place, and time. She appears well-developed and well-nourished. No distress.  HENT:  Head: Normocephalic and atraumatic.  Eyes: Conjunctivae and EOM are normal.  Neck: Neck supple. No tracheal deviation present.  Cardiovascular: Normal rate.   Pulmonary/Chest: Effort normal. No respiratory distress.  Musculoskeletal: Normal range of motion.  Neurological: She is alert and oriented to person, place, and time.  GCS 4  Skin: Skin is warm and dry.  Psychiatric: She has a normal mood and affect. Her behavior is normal.  Nursing note and vitals reviewed.          ED Course  Procedures (including critical care time)'  DIAGNOSTIC STUDIES: Oxygen Saturation is 100% on 15 L/min, normal by my interpretation.    COORDINATION OF CARE: 12:20 AM - Pt currently status epilepticus. Discussed treatment plan with pt's sister at bedside which includes CT head. Will also consult pt's oncologist [name]. Pt's sister verbalized understanding and agreed to plan.   2:04 AM - On re-evaluation, pt's sister reports she has more responsive. She has been talking to her sister, husband, and the nurse. Discussed treatment plan with pt's sister at bedside, which includes CXR, Rx antibiotics, and insulin given pt's high glucose level, and pt's sister  agreed to plan.    Labs Review Labs Reviewed  CBC WITH DIFFERENTIAL/PLATELET - Abnormal; Notable for the following:    WBC 2.6 (*)    RBC 3.27 (*)    Hemoglobin 11.4 (*)    HCT 33.2 (*)    MCV 101.5 (*)    MCH 34.9 (*)    Lymphs Abs 0.4 (*)    All other components within normal limits  BASIC METABOLIC PANEL - Abnormal; Notable for the following:    Sodium 130 (*)    Chloride 95 (*)    Glucose, Bld 528 (*)    Calcium 8.8 (*)    All other components within normal limits  COMPREHENSIVE METABOLIC PANEL - Abnormal; Notable for the following:    Potassium 3.3 (*)    Glucose, Bld 202 (*)    Calcium 8.4 (*)    Total Protein 4.9 (*)    Albumin 2.2 (*)    All other components within normal limits  CBC - Abnormal; Notable for the following:    WBC 2.7 (*)    RBC 3.01 (*)    Hemoglobin 10.2 (*)    HCT 30.3 (*)    MCV 100.7 (*)    All other components within normal limits  LACTIC ACID, PLASMA - Abnormal; Notable for the following:    Lactic Acid, Venous 4.0 (*)    All other components within normal limits  APTT - Abnormal; Notable for the following:    aPTT 22 (*)    All other components within normal limits  GLUCOSE, CAPILLARY - Abnormal; Notable for the following:    Glucose-Capillary 280 (*)    All other components within normal limits  GLUCOSE, CAPILLARY - Abnormal; Notable for the following:    Glucose-Capillary 236 (*)    All other components within normal limits  GLUCOSE, CAPILLARY - Abnormal; Notable for the following:    Glucose-Capillary 296 (*)    All other components within normal limits  GLUCOSE, CAPILLARY - Abnormal; Notable for the following:    Glucose-Capillary 316 (*)    All other components within normal limits  CBG MONITORING, ED - Abnormal; Notable for the following:    Glucose-Capillary 391 (*)    All other  components within normal limits  CULTURE, BLOOD (ROUTINE X 2)  MRSA PCR SCREENING  CULTURE, BLOOD (ROUTINE X 2)  CORTISOL-AM, BLOOD   PROCALCITONIN  PROTIME-INR  LACTIC ACID, PLASMA  URINALYSIS, ROUTINE W REFLEX MICROSCOPIC (NOT AT [facility])  HEMOGLOBIN A1C    Imaging Review Ct Head Wo Contrast  10/06/2015   CLINICAL DATA:  Multiple seizures tonight. Known metastatic breast cancer to the brain. History of hypertension, migraines and headaches.  EXAM: CT HEAD WITHOUT CONTRAST  TECHNIQUE: Contiguous axial images were obtained from the base of the skull through the vertex without intravenous contrast.  COMPARISON:  MRI of the brain with and without contrast May 09, 2015  FINDINGS: No intraparenchymal hemorrhage, mass effect, midline shift or acute large vascular territory infarcts. Scattered parenchymal calcifications, corresponding to metastasis on prior MRI. Moderate ventriculomegaly on the basis of global parenchymal brain volume loss, similar to prior imaging. Patchy supratentorial white matter hypodensities. No abnormal extra-axial fluid collections. Basal cisterns are patent.  Ocular globes and orbital contents are unremarkable. LEFT maxillary sinus mucosal thickening with air-fluid level, mild LEFT ethmoid mucosal thickening. The mastoid air cells are well aerated. No skull fracture. Scattered dural calcifications, some which correspond to metastasis on prior MRI.  IMPRESSION: No acute intracranial process.  Multiple parenchymal and dural calcifications corresponding to known metastasis.  Moderate global brain volume loss, advanced for age though similar to prior MRI. Patchy white matter hypodensities may represent chronic small vessel ischemic disease or treatment related changes.  Acute on chronic LEFT maxillary sinusitis.   Electronically Signed   By: Elon Alas M.D.   On: 10/06/2015 01:05   Mr Jeri Cos ZO Contrast  10/06/2015   CLINICAL DATA:  Seizure.  Known brain metastases.  EXAM: MRI HEAD WITHOUT AND WITH CONTRAST  TECHNIQUE: Multiplanar, multiecho pulse sequences of the brain and surrounding structures were obtained  without and with intravenous contrast.  CONTRAST:  15 mL MultiHance  COMPARISON:  CT head without contrast from the same day. MRI brain 08/09/2015.  FINDINGS: Multiple enhancing metastatic lesions of the brain have increased in size. Several of these lesions are hemorrhagic. Acute and chronic blood products are evident within a 14 mm lesion in the right frontal operculum. This lesion previously measured 11 mm. Multiple lesions in the cerebellum have increased in size. The largest right-sided lesions both now measure 2.0 cm in long axis. Confluent lesions along the left side of the superior vermis measure 2.4 x 2.0 cm on axial images. This area previously measured 2.4 x 1.1 cm. On sagittal images there were 4 discrete lesions to the left midline in the cerebellum previously. These lesions are now all nearly confluent.  Associated T2 signal changes about these lesions has increased. There is also confluent periventricular T2 change bilaterally compatible with prior radiation therapy.  No subacute infarct is present. There is focal restricted diffusion within a lesion in the medial right occipital lobe on image 23 of series 3. This can be seen in aggressive compact tumors. There is no acute hemorrhage at this lesion.  The basal cisterns and ventricles are intact. There is no herniation. No significant extra-axial fluid collection is present.  Flow is present in the major intracranial arteries. The globes and orbits are intact. A fluid level is present in the left maxillary sinus with circumferential mucosal thickening. Disease in the left maxillary sinus has progressed. Left ethmoid disease has progressed as well.  IMPRESSION: 1. Progression in size of multiple enhancing metastatic lesions throughout the brain as  described. 2. Several of these lesions are hemorrhagic with remote blood products similar to the prior study. 3. Both acute and remote blood products are evident in the right operculum. 4. Increased diffuse T2  signal changes associated with individual lesions and about the ventricles. This represents a progression of disease as well as the sequelae of whole brain radiation.   Electronically Signed   By: San Morelle M.D.   On: 10/06/2015 17:54   Dg Chest Port 1 View  10/06/2015   CLINICAL DATA:  Acute onset of seizure.  Initial encounter.  EXAM: PORTABLE CHEST 1 VIEW  COMPARISON:  Chest radiograph performed 04/05/2014, and CT of the chest performed 09/04/2015  FINDINGS: The lungs are well-aerated. There is mild elevation of the left hemidiaphragm. Pulmonary vascularity is at the upper limits of normal. There is no evidence of focal opacification, pleural effusion or pneumothorax.  The cardiomediastinal silhouette is within normal limits. A left-sided chest port is noted ending about the distal SVC. No acute osseous abnormalities are seen.  IMPRESSION: Mild elevation of the left hemidiaphragm.  Lungs otherwise clear.   Electronically Signed   By: Garald Balding M.D.   On: 10/06/2015 03:16   I have personally reviewed and evaluated these images and lab results as part of my medical decision-making.   EKG Interpretation   Date/Time:  Saturday October 05 2015 23:56:51 EDT Ventricular Rate:  95 PR Interval:  157 QRS Duration: 97 QT Interval:  373 QTC Calculation: 469 R Axis:   61 Text Interpretation:  Sinus rhythm Nonspecific T abnormalities, inferior  leads No acute changes Confirmed by Kathrynn Humble, MD, Kathan Kirker ([id number]) on  10/06/2015 12:17:02 AM      MDM   Final diagnoses:  Status epilepticus ([place])  Metastatic cancer to brain [facility])    I personally performed the services described in this documentation, which was scribed in my presence. The recorded information has been reviewed and is accurate.  Pt comes in status epilepticus. CT head ordered. Neuro consulted 4 seizures total this evening, including 1 in the ER. Keppra ordered. Fortunately, pt's seizures lasting 3 minutes or so, and she  has not had any true airway issues. She had NO GAG REFLEX when she arrived, but after keppra, no more seizure and pt was at least protecting her airway.  She is immunosuppressed. CT is not showing any new lesions. ? If there is underlying infectious process. No fevers. She has hyperglycemia. No hx of DM.  Breast lesion - no draianage currently but there is some redness. No foul smell. Family reports previous surgery.   I had conversation with the patient's family about goals of care. I started with asking them what the understanding of their disease and condition was. I was informed by the sister that pt has Stage 4 breast CA with brain mets. She has been told that the cancer is curable. I had a frank conversation with the patient's family, that the CURE was likely not medically possible at this time. I told them that i am not a cancer physician, but the goals of care should be addressed with the cancer doctor while patient is still able to make coherent decisions. We will admit.   CRITICAL CARE Performed by: Mary [name]   Total critical care time: 65 min  Critical care time was exclusive of separately billable procedures and treating other patients.  Critical care was necessary to treat or prevent imminent or life-threatening deterioration.  Critical care was time spent personally by me on  the following activities: development of treatment plan with patient and/or surrogate as well as nursing, discussions with consultants, evaluation of patient's response to treatment, examination of patient, obtaining history from patient or surrogate, ordering and performing treatments and interventions, ordering and review of laboratory studies, ordering and review of radiographic studies, pulse oximetry and re-evaluation of patient's condition.     Mary Biles, MD 10/07/15 [place], MD 10/07/15 [phone]

## 2015-10-06 NOTE — ED Notes (Signed)
Attempted report 

## 2015-10-06 NOTE — Progress Notes (Signed)
11:22 AM I agree with HPI/GPe and A/P per Dr. Mora Bellman  59 y/o ? followed by [name] for Triple + breast Ca c Brain diagnosed 03/2014  Has had multiple irradiaive fractions to the brain by [name] not amenable to stereotactic surgery She was supposed to hav beend follwed at [facility] breast cancer brain met clinic S/p 5 cycles carboplatin/gemzar/perjeta/herceptin  Presented from home with 4x seizure epioades-ten proceeded to have 2 more which left her less responsive/confused       HEENT awakens but not oriented.  Somewhat purposeless movements and not repsoding to voice but rouses to it CHEST clear no added sound CARDIAC s1 s 2no m/r/g ABDOMEN soft  Patient Active Problem List   Diagnosis Date Noted  . Hyperglycemia 10/06/2015  . Seizure ([place]) 10/06/2015  . Status epilepticus ([place]) 10/06/2015  . Wound infection ([place]) 10/06/2015  . over 75 brain metastases, several showing hemorrhage 03/04/2015  . Wound of right breast 08/06/2014  . Breast cancer metastasized to multiple sites ([place]) 04/12/2014  . Protein-calorie malnutrition, severe ([place]) 04/06/2014  . Fever, unspecified 04/05/2014  . Right hip pain 04/05/2014  . Abdominal tenderness, generalized 04/05/2014  . Back pain 04/05/2014  . Cellulitis 04/05/2014  . Migraine headache 04/05/2014  . Elevated liver enzymes 04/05/2014  . Sepsis ([place]) 04/05/2014  . Preventative health care 07/04/2013  . Anxiety state, unspecified 07/04/2013  . Arthritis   . Depression   . Hypertension   . Migraines    sz-continue Decadron-have changed it to IV q6-44m.  Neuro has seen and recommed MRI, however it is unlikely in her current state she will be able/willing to cooperate.  If not able to perform, will ask for MRI under sedation fro am 10/07/15.  Keep prn ativan on board.  I will alert Dr. eMarin Olpto her admission-suspect may need to consider palliative care input at some stage  Hyperglycemia-continue SS1 + lantus 5.  CBG now 200  range  Cellulitis breast continue IV vancomycin and aztreonam  JVerneita Griffes MD [facility] ([phone]

## 2015-10-06 NOTE — ED Notes (Signed)
Pt much more alert now. Pt able to look toward family when name is called. Bed linens changed due to incontinence from last seizure. Family turned to side for comfort. NRB removed by patient . sats remain 95% on room air

## 2015-10-06 NOTE — ED Notes (Signed)
Pt incontinent; bed linens changed

## 2015-10-06 NOTE — ED Notes (Signed)
Pt to CT with RN and MD

## 2015-10-06 NOTE — Progress Notes (Addendum)
PT Cancellation Note  Patient Details Name: Mary [name] MRN: [id number] DOB: [birth year]   Cancelled Treatment:    Reason Eval/Treat Not Completed: Patient not medically ready  (8:40 am) Noted activity orders immediately followed by strict bedrest orders. Noted Neuro Consult indicates flaccid tone x 4 extremities and pt to have MRI brain (per note). Will follow-up later today to see if pt appropriate for PT eval.  Addendum (1:10 pm) Spoke with RN and pt restless and not following commands. Will defer PT evaluation until 10/10 as appropriate.  Shanta Hartner 10/06/2015, 8:40 AM  Pager [phone]

## 2015-10-06 NOTE — Progress Notes (Signed)
Utilization Review Completed.Mary [name] T10/08/2015  

## 2015-10-06 NOTE — ED Notes (Signed)
Paged Neuro to [phone]

## 2015-10-06 NOTE — ED Notes (Signed)
Dr Niu at bedside 

## 2015-10-07 ENCOUNTER — Inpatient Hospital Stay (HOSPITAL_COMMUNITY): Payer: Medicaid Other

## 2015-10-07 DIAGNOSIS — T380X5A Adverse effect of glucocorticoids and synthetic analogues, initial encounter: Secondary | ICD-10-CM

## 2015-10-07 LAB — GLUCOSE, CAPILLARY
GLUCOSE-CAPILLARY: 298 mg/dL — AB (ref 65–99)
GLUCOSE-CAPILLARY: 360 mg/dL — AB (ref 65–99)
GLUCOSE-CAPILLARY: 429 mg/dL — AB (ref 65–99)
Glucose-Capillary: 168 mg/dL — ABNORMAL HIGH (ref 65–99)
Glucose-Capillary: 253 mg/dL — ABNORMAL HIGH (ref 65–99)
Glucose-Capillary: 316 mg/dL — ABNORMAL HIGH (ref 65–99)

## 2015-10-07 LAB — HEMOGLOBIN A1C
HEMOGLOBIN A1C: 12.6 % — AB (ref 4.8–5.6)
MEAN PLASMA GLUCOSE: 315 mg/dL

## 2015-10-07 MED ORDER — LEVOFLOXACIN 500 MG PO TABS
500.0000 mg | ORAL_TABLET | Freq: Every day | ORAL | Status: DC
  Administered 2015-10-08: 500 mg via ORAL
  Filled 2015-10-07 (×4): qty 1

## 2015-10-07 MED ORDER — PALBOCICLIB 125 MG PO CAPS
125.0000 mg | ORAL_CAPSULE | Freq: Every day | ORAL | Status: DC
  Administered 2015-10-07 – 2015-10-10 (×4): 125 mg via ORAL
  Filled 2015-10-07: qty 1

## 2015-10-07 MED ORDER — POTASSIUM CHLORIDE CRYS ER 20 MEQ PO TBCR
40.0000 meq | EXTENDED_RELEASE_TABLET | Freq: Once | ORAL | Status: AC
  Administered 2015-10-07: 40 meq via ORAL
  Filled 2015-10-07: qty 2

## 2015-10-07 MED ORDER — INSULIN GLARGINE 100 UNIT/ML ~~LOC~~ SOLN
12.0000 [IU] | Freq: Every day | SUBCUTANEOUS | Status: DC
  Administered 2015-10-07: 12 [IU] via SUBCUTANEOUS
  Filled 2015-10-07 (×2): qty 0.12

## 2015-10-07 MED ORDER — GADOBENATE DIMEGLUMINE 529 MG/ML IV SOLN
15.0000 mL | Freq: Once | INTRAVENOUS | Status: AC | PRN
  Administered 2015-10-06: 15 mL via INTRAVENOUS

## 2015-10-07 MED ORDER — INSULIN ASPART 100 UNIT/ML ~~LOC~~ SOLN
4.0000 [IU] | Freq: Once | SUBCUTANEOUS | Status: AC
  Administered 2015-10-07: 4 [IU] via INTRAVENOUS

## 2015-10-07 MED ORDER — ENOXAPARIN SODIUM 40 MG/0.4ML ~~LOC~~ SOLN
40.0000 mg | SUBCUTANEOUS | Status: DC
  Administered 2015-10-07 – 2015-10-09 (×3): 40 mg via SUBCUTANEOUS
  Filled 2015-10-07 (×3): qty 0.4

## 2015-10-07 MED ORDER — EXEMESTANE 25 MG PO TABS
25.0000 mg | ORAL_TABLET | Freq: Every day | ORAL | Status: DC
  Administered 2015-10-07 – 2015-10-10 (×4): 25 mg via ORAL
  Filled 2015-10-07 (×6): qty 1

## 2015-10-07 MED ORDER — INSULIN ASPART 100 UNIT/ML ~~LOC~~ SOLN
8.0000 [IU] | Freq: Once | SUBCUTANEOUS | Status: AC
  Administered 2015-10-07: 8 [IU] via SUBCUTANEOUS

## 2015-10-07 NOTE — Progress Notes (Addendum)
[name] - Stepdown/ICU TEAM PROGRESS NOTE  Mary [name] IFO:[id number] DOB: January 20, 1956 DOA: 10/05/2015 PCP: Mary Cower, MD  Admit HPI / Brief Narrative: 59 y/o ? followed by 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 not amenable to stereotactic surgery.  She was supposed to have been follwed at [facility] breast cancer brain met clinic s/p 5 cycles carboplatin/gemzar/perjeta/herceptin.  She presented from home 10/9 with recurring seizures/    HPI/Subjective: Pt is sitting up in a chair eating.  She denies cp, sob, n/v, or abdom pain.  Her sister at the bedside states she is much improved.    Assessment/Plan:  New onset generalized Seizure Continue Keppra per neurology recommendations - no evidence of recurrent sz activity   Steroid-induced hyperglycemia / DM CBGs better controlled but not yet at goal - A1c severely elevated at 12.6 indicating steroid induced DM - adjust tx further and follow   Metastatic breast cancer MRI brain notes progression of metastatic disease - oncology suggests that we continue her on the Aromasin and Ibrance, and will speak to radiation oncology about further potential options for treatment of her brain metastases  Chronic wound right breast Patient has a 1 year history of an open wound of the lateral right breast - WOC has evaluated and we are following their care recommendations  Pseudohyponatremia Due to severe hyperglycemia - resolved   Hypokalemia Supplement and follow - check magnesium  Leukopenia Felt to be due to treatment for breast cancer  Lactic acidosis Rapidly corrected with simple volume resuscitation - due to seizure activity  Hypertension BP currently well controlled   Code Status: FULL Family Communication: spoke w/ pt and sister at bedside  Disposition Plan: transfer to neuro bed - begin PT/OT - control CBGs - await determination per  Rad Onc on tx options   Consultants: Neurology Oncology  Procedures: 10/10 - bilateral upper extremity venous duplex - no evidence of DVT  Antibiotics: Aztreonam 10/8 > 10/10 Vancomycin 10/8 > 10/10 Levaquin 10/10 >  DVT prophylaxis: Lovenox  Objective: Blood pressure 104/87, pulse 70, temperature 97.9 F (36.6 C), temperature source Oral, resp. rate 18, height 5' 2"  (1.575 m), weight 69.5 kg (153 lb 3.5 oz), SpO2 99 %.  Intake/Output Summary (Last 24 hours) at 10/07/15 1411 Last data filed at 10/07/15 0000  Gross per 24 hour  Intake   1910 ml  Output      0 ml  Net   1910 ml   Exam: General: No acute respiratory distress Lungs: Clear to auscultation bilaterally without wheezes or crackles Cardiovascular: Regular rate and rhythm without murmur gallop or rub normal S1 and S2 Abdomen: Nontender, nondistended, soft, bowel sounds positive, no rebound, no ascites, no appreciable mass Extremities: No significant cyanosis, clubbing, or edema bilateral lower extremities  Data Reviewed: Basic Metabolic Panel:  Recent Labs Lab 10/06/15 0108 10/06/15 0501  NA 130* 138  K 3.8 3.3*  CL 95* 101  CO2 24 25  GLUCOSE 528* 202*  BUN 14 10  CREATININE 0.82 0.67  CALCIUM 8.8* 8.4*    CBC:  Recent Labs Lab 10/06/15 0108 10/06/15 0501  WBC 2.6* 2.7*  NEUTROABS 2.1  --   HGB 11.4* 10.2*  HCT 33.2* 30.3*  MCV 101.5* 100.7*  PLT 243 218    Liver Function Tests:  Recent Labs Lab 10/06/15 0501  AST 26  ALT 16  ALKPHOS 71  BILITOT 0.7  PROT 4.9*  ALBUMIN 2.2*   Coags:  Recent Labs Lab 10/06/15 0501  INR 1.13    Recent Labs Lab 10/06/15 0501  APTT 22*    CBG:  Recent Labs Lab 10/06/15 0802 10/06/15 1208 10/06/15 1945 10/06/15 2352 10/07/15 0502  GLUCAP 280* 236* 296* 316* 298*    Recent Results (from the past 240 hour(s))  Blood culture (routine x 2)     Status: None (Preliminary result)   Collection Time: 10/06/15  2:35 AM  Result Value  Ref Range Status   Specimen Description BLOOD LEFT CHEST  Final   Special Requests BOTTLES DRAWN AEROBIC AND ANAEROBIC 10CC  Final   Culture NO GROWTH 1 DAY  Final   Report Status PENDING  Incomplete  MRSA PCR Screening     Status: None   Collection Time: 10/06/15  4:10 AM  Result Value Ref Range Status   MRSA by PCR NEGATIVE NEGATIVE Final    Comment:        The GeneXpert MRSA Assay (FDA approved for NASAL specimens only), is one component of a comprehensive MRSA colonization surveillance program. It is not intended to diagnose MRSA infection nor to guide or monitor treatment for MRSA infections.   Blood culture (routine x 2)     Status: None (Preliminary result)   Collection Time: 10/06/15  5:01 AM  Result Value Ref Range Status   Specimen Description BLOOD RIGHT ARM  Final   Special Requests IN PEDIATRIC BOTTLE 3CC  Final   Culture NO GROWTH 1 DAY  Final   Report Status PENDING  Incomplete     Studies:   Recent x-ray studies have been reviewed in detail by the Attending Physician  Scheduled Meds:  Scheduled Meds: . aztreonam  1 g Intravenous 3 times per day  . dexamethasone  4 mg Intravenous 4 times per day  . exemestane  25 mg Oral QPC breakfast  . heparin  5,000 Units Subcutaneous 3 times per day  . insulin aspart  0-9 Units Subcutaneous TID WC  . insulin glargine  5 Units Subcutaneous QHS  . levETIRAcetam  500 mg Intravenous Q12H  . palbociclib  125 mg Oral Q breakfast  . pantoprazole  40 mg Oral BID  . vancomycin  1,000 mg Intravenous Q12H    Time spent on care of this patient: 35 mins   Mary [name] , MD   [facility] Office  [phone] Pager - Text Page per Shea Evans as per below:  On-Call/Text Page:      [email or web address]      password [email or web address]  If 7PM-7AM, please contact night-coverage [email or web address] Password [email or web address] 10/07/2015, 2:11 PM   LOS: 1 day

## 2015-10-07 NOTE — Clinical Documentation Improvement (Signed)
Hospitalist  Abnormal Lab/Test Results:   10/09: Lactic acid: 4.0.  Possible Clinical Conditions associated with below indicators  Sepsis, specify causative agent if known  Identify etiology of Sepsis  Other Condition  Cannot Clinically Determine   Please exercise your independent, professional judgment when responding. A specific answer is not anticipated or expected.   Thank You,  [place] [phone]

## 2015-10-07 NOTE — Progress Notes (Signed)
Subjective: Patient resting comfortably. No further events.   MRI brain imaging reviewed, shows progression of metastatic lesions.   Objective: Current vital signs: BP 104/87 mmHg  Pulse 70  Temp(Src) 97.9 F (36.6 C) (Oral)  Resp 18  Ht 5\' 2"  (1.575 m)  Wt 69.5 kg (153 lb 3.5 oz)  BMI 28.02 kg/m2  SpO2 99% Vital signs in last 24 hours: Temp:  [97.5 F (36.4 C)-98.3 F (36.8 C)] 97.9 F (36.6 C) (10/10 1119) Pulse Rate:  [61-89] 70 (10/10 0820) Resp:  [14-22] 18 (10/10 0820) BP: (104-155)/(80-87) 104/87 mmHg (10/10 0820) SpO2:  [99 %-100 %] 99 % (10/10 0820)  Intake/Output from previous day: 10/09 0701 - 10/10 0700 In: 1910 [I.V.:1400; IV Piggyback:510] Out: -  Intake/Output this shift:   Nutritional status: Diet NPO time specified Except for: Sips with Meds  Neurologic Exam: Patient was alert, oriented x 3. No signs of aphasia Pupils were equal and reacted normally to light. Extraocular movements were full  Face was symmetrical with no signs of focal weakness. Strength of extremities was normal and equal Light touch intact in all extremities Plantar responses were flexor.  Lab Results: Basic Metabolic Panel:  Recent Labs Lab 10/06/15 0108 10/06/15 0501  NA 130* 138  K 3.8 3.3*  CL 95* 101  CO2 24 25  GLUCOSE 528* 202*  BUN 14 10  CREATININE 0.82 0.67  CALCIUM 8.8* 8.4*    Liver Function Tests:  Recent Labs Lab 10/06/15 0501  AST 26  ALT 16  ALKPHOS 71  BILITOT 0.7  PROT 4.9*  ALBUMIN 2.2*   No results for input(s): LIPASE, AMYLASE in the last 168 hours. No results for input(s): AMMONIA in the last 168 hours.  CBC:  Recent Labs Lab 10/06/15 0108 10/06/15 0501  WBC 2.6* 2.7*  NEUTROABS 2.1  --   HGB 11.4* 10.2*  HCT 33.2* 30.3*  MCV 101.5* 100.7*  PLT 243 218    Cardiac Enzymes: No results for input(s): CKTOTAL, CKMB, CKMBINDEX, TROPONINI in the last 168 hours.  Lipid Panel: No results for input(s): CHOL, TRIG, HDL, CHOLHDL,  VLDL, LDLCALC in the last 168 hours.  CBG:  Recent Labs Lab 10/06/15 0802 10/06/15 1208 10/06/15 1945 10/06/15 2352 10/07/15 0502  GLUCAP 280* 236* 296* 316* 298*    Microbiology: Results for orders placed or performed during the hospital encounter of 10/05/15  MRSA PCR Screening     Status: None   Collection Time: 10/06/15  4:10 AM  Result Value Ref Range Status   MRSA by PCR NEGATIVE NEGATIVE Final    Comment:        The GeneXpert MRSA Assay (FDA approved for NASAL specimens only), is one component of a comprehensive MRSA colonization surveillance program. It is not intended to diagnose MRSA infection nor to guide or monitor treatment for MRSA infections.   Blood culture (routine x 2)     Status: None (Preliminary result)   Collection Time: 10/06/15  5:01 AM  Result Value Ref Range Status   Specimen Description BLOOD RIGHT ARM  Final   Special Requests IN PEDIATRIC BOTTLE 3CC  Final   Culture PENDING  Incomplete   Report Status PENDING  Incomplete    Coagulation Studies:  Recent Labs  10/06/15 0501  LABPROT 14.7  INR 1.13    Imaging: Ct Head Wo Contrast  10/06/2015   CLINICAL DATA:  Multiple seizures tonight. Known metastatic breast cancer to the brain. History of hypertension, migraines and headaches.  EXAM: CT HEAD  WITHOUT CONTRAST  TECHNIQUE: Contiguous axial images were obtained from the base of the skull through the vertex without intravenous contrast.  COMPARISON:  MRI of the brain with and without contrast May 09, 2015  FINDINGS: No intraparenchymal hemorrhage, mass effect, midline shift or acute large vascular territory infarcts. Scattered parenchymal calcifications, corresponding to metastasis on prior MRI. Moderate ventriculomegaly on the basis of global parenchymal brain volume loss, similar to prior imaging. Patchy supratentorial white matter hypodensities. No abnormal extra-axial fluid collections. Basal cisterns are patent.  Ocular globes and orbital  contents are unremarkable. LEFT maxillary sinus mucosal thickening with air-fluid level, mild LEFT ethmoid mucosal thickening. The mastoid air cells are well aerated. No skull fracture. Scattered dural calcifications, some which correspond to metastasis on prior MRI.  IMPRESSION: No acute intracranial process.  Multiple parenchymal and dural calcifications corresponding to known metastasis.  Moderate global brain volume loss, advanced for age though similar to prior MRI. Patchy white matter hypodensities may represent chronic small vessel ischemic disease or treatment related changes.  Acute on chronic LEFT maxillary sinusitis.   Electronically Signed   By: Elon Alas M.D.   On: 10/06/2015 01:05   Mr Jeri Cos VZ Contrast  10/06/2015   CLINICAL DATA:  Seizure.  Known brain metastases.  EXAM: MRI HEAD WITHOUT AND WITH CONTRAST  TECHNIQUE: Multiplanar, multiecho pulse sequences of the brain and surrounding structures were obtained without and with intravenous contrast.  CONTRAST:  15 mL MultiHance  COMPARISON:  CT head without contrast from the same day. MRI brain 08/09/2015.  FINDINGS: Multiple enhancing metastatic lesions of the brain have increased in size. Several of these lesions are hemorrhagic. Acute and chronic blood products are evident within a 14 mm lesion in the right frontal operculum. This lesion previously measured 11 mm. Multiple lesions in the cerebellum have increased in size. The largest right-sided lesions both now measure 2.0 cm in long axis. Confluent lesions along the left side of the superior vermis measure 2.4 x 2.0 cm on axial images. This area previously measured 2.4 x 1.1 cm. On sagittal images there were 4 discrete lesions to the left midline in the cerebellum previously. These lesions are now all nearly confluent.  Associated T2 signal changes about these lesions has increased. There is also confluent periventricular T2 change bilaterally compatible with prior radiation therapy.   No subacute infarct is present. There is focal restricted diffusion within a lesion in the medial right occipital lobe on image 23 of series 3. This can be seen in aggressive compact tumors. There is no acute hemorrhage at this lesion.  The basal cisterns and ventricles are intact. There is no herniation. No significant extra-axial fluid collection is present.  Flow is present in the major intracranial arteries. The globes and orbits are intact. A fluid level is present in the left maxillary sinus with circumferential mucosal thickening. Disease in the left maxillary sinus has progressed. Left ethmoid disease has progressed as well.  IMPRESSION: 1. Progression in size of multiple enhancing metastatic lesions throughout the brain as described. 2. Several of these lesions are hemorrhagic with remote blood products similar to the prior study. 3. Both acute and remote blood products are evident in the right operculum. 4. Increased diffuse T2 signal changes associated with individual lesions and about the ventricles. This represents a progression of disease as well as the sequelae of whole brain radiation.   Electronically Signed   By: San Morelle M.D.   On: 10/06/2015 17:54   Dg Chest  Port 1 View  10/06/2015   CLINICAL DATA:  Acute onset of seizure.  Initial encounter.  EXAM: PORTABLE CHEST 1 VIEW  COMPARISON:  Chest radiograph performed 04/05/2014, and CT of the chest performed 09/04/2015  FINDINGS: The lungs are well-aerated. There is mild elevation of the left hemidiaphragm. Pulmonary vascularity is at the upper limits of normal. There is no evidence of focal opacification, pleural effusion or pneumothorax.  The cardiomediastinal silhouette is within normal limits. A left-sided chest port is noted ending about the distal SVC. No acute osseous abnormalities are seen.  IMPRESSION: Mild elevation of the left hemidiaphragm.  Lungs otherwise clear.   Electronically Signed   By: Garald Balding M.D.   On:  10/06/2015 03:16    Medications:  Scheduled: . aztreonam  1 g Intravenous 3 times per day  . dexamethasone  4 mg Intravenous 4 times per day  . exemestane  25 mg Oral QPC breakfast  . heparin  5,000 Units Subcutaneous 3 times per day  . insulin aspart  0-9 Units Subcutaneous TID WC  . insulin glargine  5 Units Subcutaneous QHS  . levETIRAcetam  500 mg Intravenous Q12H  . palbociclib  125 mg Oral Q breakfast  . pantoprazole  40 mg Oral BID  . vancomycin  1,000 mg Intravenous Q12H    Assessment/Plan:  59y/o woman with breast cancer with known metastasis to the brain presenting with new onset generalized seizure activity. MRI brain shows progression of metastatic disease. No further seizure episodes.   -continue keppra 500mg  BID. No indication for EEG at this time -further workup and plan per oncology and radiation-oncology -Patient is unable to drive, operate heavy machinery, perform activities at heights or participate in water activities until release by outpatient physician.  -will need outpatient neurology follow up. No further inpatient neurological workup at this time    LOS: 1 day   Jim Like, DO [facility] [phone]  If 7pm- 7am, please page neurology on call as listed in [place]. 10/07/2015  11:32 AM

## 2015-10-07 NOTE — Consult Note (Signed)
Referral MD  Reason for Referral: New onset seizures secondary to metastatic breast cancer to the brain   Chief Complaint  Patient presents with  . Seizures  : Unable to give any history.  HPI: Mary [name] is well-known to me. She is a 59 year old African-American female. She has metastatic breast cancer. We have been treating her for probably urinary half. She had done fairly well. She has triple positive breast cancer. She has brain metastasis. She has had multiple cranial radiation therapy interventions. We had her set up to be seen at the brain metastasis clinic at [facility]. She reports Mary [name] has missed her appointments.  We have not given her chemotherapy for a while. She had been on Aromasin/Ibrance. I am not sure if she's been taking this. Her last tumor marker back in late August was up to 124.  She was having a hard time with systemic chemotherapy. Her blood counts just cannot tolerate the chemotherapy. As such, we switched her over to Aromasin/Ibrance.  She also has some oral issues. She had been on Zometa. 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  She probably had seizures on Saturday. She was admitted. MRI showed progression of brain metastases. This really is no surprise.  Her husband was with her today. He is very nice. He has been working quite a bit.  She really is unable to answer my questions.  He says that she's not been any, pain. She's had no nausea or vomiting. Her appetite has been down.  She was supposed to have an appointment with the [facility] metastatic brain metastases clinic this Wednesday.                    Past Medical History  Diagnosis Date  . Arthritis   . Depression   . Fainting   . Headache   . Hypertension   . Migraines   . History of blood transfusion   . Gastric ulcer   . Breast cancer metastasized to multiple sites [facility]) 04/12/2014  :  Past Surgical History  Procedure Laterality Date  . Abdominal  hysterectomy  15 years go    partial  :   Current facility-administered medications:  .  0.9 %  sodium chloride infusion, , Intravenous, Continuous, Ivor Costa, MD, Last Rate: 100 mL/hr at 10/06/15 1142 .  acetaminophen (TYLENOL) tablet 650 mg, 650 mg, Oral, Q6H PRN **OR** acetaminophen (TYLENOL) suppository 650 mg, 650 mg, Rectal, Q6H PRN, Ivor Costa, MD .  aztreonam (AZACTAM) 1 g in dextrose 5 % 50 mL IVPB, 1 g, Intravenous, 3 times per day, Laren Everts, RPH, 1 g at 10/06/15 2304 .  dexamethasone (DECADRON) injection 4 mg, 4 mg, Intravenous, 4 times per day, Nita Sells, MD, 4 mg at 10/07/15 0127 .  heparin injection 5,000 Units, 5,000 Units, Subcutaneous, 3 times per day, Ivor Costa, MD, 5,000 Units at 10/06/15 2305 .  hydrALAZINE (APRESOLINE) injection 5 mg, 5 mg, Intravenous, Q2H PRN, Ivor Costa, MD .  insulin aspart (novoLOG) injection 0-9 Units, 0-9 Units, Subcutaneous, TID WC, Ivor Costa, MD, 3 Units at 10/06/15 1237 .  insulin glargine (LANTUS) injection 5 Units, 5 Units, Subcutaneous, QHS, Ivor Costa, MD, 5 Units at 10/06/15 2304 .  levETIRAcetam (KEPPRA) 500 mg in sodium chloride 0.9 % 100 mL IVPB, 500 mg, Intravenous, Q12H, Ivor Costa, MD, 500 mg at 10/06/15 2302 .  LORazepam (ATIVAN) injection 1 mg, 1 mg, Intravenous, Q4H PRN, Ivor Costa, MD, 1 mg at 10/06/15 1515 .  ondansetron (ZOFRAN) tablet 4 mg, 4 mg, Oral, Q6H PRN **OR** ondansetron (ZOFRAN) injection 4 mg, 4 mg, Intravenous, Q6H PRN, Ivor Costa, MD .  pantoprazole (PROTONIX) EC tablet 40 mg, 40 mg, Oral, BID, Ivor Costa, MD, 40 mg at 10/06/15 0300 .  vancomycin (VANCOCIN) IVPB 1000 mg/200 mL premix, 1,000 mg, Intravenous, Q12H, Veronda P Bryk, RPH, 1,000 mg at 10/07/15 0127:  . aztreonam  1 g Intravenous 3 times per day  . dexamethasone  4 mg Intravenous 4 times per day  . heparin  5,000 Units Subcutaneous 3 times per day  . insulin aspart  0-9 Units Subcutaneous TID WC  . insulin glargine  5 Units Subcutaneous QHS  .  levETIRAcetam  500 mg Intravenous Q12H  . pantoprazole  40 mg Oral BID  . vancomycin  1,000 mg Intravenous Q12H  :  Allergies  Allergen Reactions  . Hydrocodone     "makes me crawl on the floor"  . Penicillins Swelling    Per pt's sister, swelling of throat  . Aspirin Other (See Comments)    Due to ulcers  :  Family History  Problem Relation Age of Onset  . Arthritis Other   . Hypertension Mother   . Hypertension Other   . Heart disease Other   . Stroke Father   . Cancer Sister     leukemia  :  Social History   Social History  . Marital Status: Married    Spouse Name: N/A  . Number of Children: 0  . Years of Education: N/A   Occupational History  .      no work for 4 to 5 years   Social History Main Topics  . Smoking status: Never Smoker   . Smokeless tobacco: Never Used     Comment: never used tobacco  . Alcohol Use: No  . Drug Use: No  . Sexual Activity: Not Currently   Other Topics Concern  . Not on file   Social History Narrative  :  Pertinent items are noted in HPI.  Exam: Patient Vitals for the past 24 hrs:  BP Temp Temp src Pulse Resp SpO2  10/07/15 0503 118/83 mmHg 97.5 F (36.4 C) Axillary 70 19 99 %  10/06/15 2327 (!) 144/86 mmHg 98.1 F (36.7 C) Oral 77 19 100 %  10/06/15 1937 (!) 155/80 mmHg 97.9 F (36.6 C) Axillary 61 14 99 %  10/06/15 1148 120/81 mmHg 98.3 F (36.8 C) Oral 89 (!) 22 100 %  10/06/15 0855 125/68 mmHg - - 82 14 100 %    as above    Recent Labs  10/06/15 0108 10/06/15 0501  WBC 2.6* 2.7*  HGB 11.4* 10.2*  HCT 33.2* 30.3*  PLT 243 218    Recent Labs  10/06/15 0108 10/06/15 0501  NA 130* 138  K 3.8 3.3*  CL 95* 101  CO2 24 25  GLUCOSE 528* 202*  BUN 14 10  CREATININE 0.82 0.67  CALCIUM 8.8* 8.4*    Blood smear review:  None  Pathology: None     Assessment and Plan:  Mary [name] is a 59 year old African-American female. She has metastatic breast cancer. She has progressive brain  metastases.  This, in my mind, is a real problem. We just have very few options to try to help. I will speak with radiation oncology to see if they might be able to give her anymore radiation.  As far as systemic therapy is concerned, I'm not sure this really is an  issue. She probably has progressive disease systemically but her prognosis is clearly dictated by her brain metastases.  I appreciate everybody's help with her.  I probably would continue her on the Aromasin and Ibrance. I think this is reasonable.  When she came in with marked hyperglycemia. This also could've been an issue.  She has some mild leukopenia. This might be an indicator that she has been on the [place].  I will follow along closely. Again, appreciate everybody's help and try to take care of her.  Pete E.  2 Thessalonians 3:3

## 2015-10-07 NOTE — Clinical Documentation Improvement (Signed)
Hospitalist  Abnormal Lab/Test Results:   10/30: Sodium: 130.  Possible Clinical Conditions associated with below indicators  Hyponatremia  Other Condition  Cannot Clinically Determine   Treatment Provided: 10/09: 0.9% NaCl @ 100 ml/hr.   Please exercise your independent, professional judgment when responding. A specific answer is not anticipated or expected.   Thank You,  [place] [phone]

## 2015-10-07 NOTE — Progress Notes (Addendum)
Patient arrived to 5M22 from 3S alert. Patient is asking when she can be discharge. RN explained that this is the discussion she must speak with her MD in the AM. Currently she appears in no distress, she denied pain at this time. Family at bedside. BS checked of 350. SSI covered. Safety precautions reviewed with pt. Call light and possessions within reach. TELE applied and confirmed. Will continue to monitor.   Ave Filter, RN

## 2015-10-07 NOTE — Progress Notes (Signed)
*  PRELIMINARY RESULTS* Vascular Ultrasound Bilateral upper extremity venous duplex has been completed.  Preliminary findings: no evidence of DVT.   Landry Mellow, RDMS, RVT  10/07/2015, 10:33 AM

## 2015-10-07 NOTE — Care Management Note (Signed)
Case Management Note  Patient Details  Name: Mary [name] MRN: [id number] Date of Birth: [birth year]  Subjective/Objective:                 Admitted with Seizure, Breast CA w/ Brain mets. From home with husband.   Action/Plan: Return to home when medically stable. CM to f/u with disposition needs.   Expected Discharge Date:  10/09/15               Expected Discharge Plan:  [place]  In-House Referral:     Discharge planning Services  CM Consult  Post Acute Care Choice:    Choice offered to:  Patient ([place]  )  DME Arranged:    DME Agency:     HH Arranged:    [place] Agency:     Status of Service:  In process, will continue to follow  Medicare Important Message Given:    Date Medicare IM Given:    Medicare IM give by:    Date Additional Medicare IM Given:    Additional Medicare Important Message give by:     If discussed at [place] of Stay Meetings, dates discussed:    Additional Comments: CM spoke with pt/husband and sister regarding discharge planning. Husband is pt's primary caregiver, works 10-8pm  M-F. Sister assists with care while husband is @ work. Family requesting to use [facility] services for   [facility] needs @ d/c. Per PT's recommendation: Home health PT;Supervision/Assistance - 24 hour.  [place], [place] [phone] 10/07/2015, 3:59 PM

## 2015-10-07 NOTE — Progress Notes (Signed)
OT Cancellation Note  Patient Details Name: Mary [name] MRN: [id number] DOB: 1956-08-07   Cancelled Treatment:    Reason Eval/Treat Not Completed: Patient not medically ready Pt with active bedrest order. Please update activity orders when appropriate for therapy. Thanks [place], OTR/L  [phone] 10/07/2015 10/07/2015, 7:43 AM

## 2015-10-07 NOTE — Evaluation (Signed)
Physical Therapy Evaluation Patient Details Name: Mary [name] MRN: [id number] DOB: 08-May-1956 Today's Date: 10/07/2015   History of Present Illness  Mary [name] is a 59 y.o. 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  MRI shows progression of metastatic lesions  Clinical Impression  Pt admitted with/for seizure. Generally weak and slowed.  Family lining up  24 hour assist.  Pt currently limited functionally due to the problems listed below.  (see problems list.)  Pt will benefit from PT to maximize function and safety to be able to get home safely with available assist of family and outside caregivers.     Follow Up Recommendations Home health PT;Supervision/Assistance - 24 hour;Other (comment) (pt's husband understands needs to line up assist)    Equipment Recommendations  None recommended by PT    Recommendations for Other Services       Precautions / Restrictions Precautions Precautions: Fall      Mobility  Bed Mobility Overal bed mobility: Needs Assistance Bed Mobility: Supine to Sit     Supine to sit: Mod assist     General bed mobility comments: needed truncal assist to come forward up via R elbow and cued to use UE's  Transfers Overall transfer level: Needs assistance Equipment used: 1 person hand held assist (iv pole) Transfers: Sit to/from [facility] Sit to Stand: Min assist;Mod assist Stand pivot transfers: Min assist;Mod assist       General transfer comment: more assist as fatigued  Ambulation/Gait Ambulation/Gait assistance: Mod assist Ambulation Distance (Feet): 8 Feet Assistive device: 1 person hand held assist (iv pole) Gait Pattern/deviations: Step-through pattern;Decreased step length - right;Decreased step length - left;Decreased stride length;Shuffle   Gait velocity interpretation:  Below normal speed for age/gender    Stairs            Wheelchair Mobility    Modified Rankin (Stroke Patients Only)       Balance Overall balance assessment: Needs assistance Sitting-balance support: No upper extremity supported;Single extremity supported Sitting balance-Leahy Scale: Fair Sitting balance - Comments: doesn't accept challenge well.   Standing balance support: During functional activity;Single extremity supported;Bilateral upper extremity supported Standing balance-Leahy Scale: Poor Standing balance comment: reliance on UE's and assist                             Pertinent Vitals/Pain Pain Assessment: No/denies pain    Home Living Family/patient expects to be discharged to:: Private residence Living Arrangements: Spouse/significant other Available Help at Discharge: Family;Other (Comment) (up to 24/7, sister days and husband nights) Type of Home: House Home Access: Level entry     Home Layout: One level Home Equipment: Walker - 2 wheels;Bedside commode;Shower seat      Prior Function                 Hand Dominance        Extremity/Trunk Assessment   Upper Extremity Assessment: Generalized weakness (weak grip, weak biceps/triceps, but can isolate mvmt)           Lower Extremity Assessment: Generalized weakness;RLE deficits/detail RLE Deficits / Details: R and L LE similar.  grossly 3 to 3+ hip flexors, quads, 3/5 hams and df/pf       Communication   Communication: No difficulties (mildly dysarthric, but intelligible)  Cognition Arousal/Alertness: Awake/alert Behavior During Therapy: WFL for  tasks assessed/performed Overall Cognitive Status: Impaired/Different from baseline Area of Impairment: Attention;Following commands;Safety/judgement;Problem solving   Current Attention Level: Sustained   Following Commands: Follows one step commands inconsistently;Follows one step commands with increased  time Safety/Judgement: Decreased awareness of safety;Decreased awareness of deficits   Problem Solving: Slow processing;Decreased initiation      General Comments      Exercises        Assessment/Plan    PT Assessment Patient needs continued PT services  PT Diagnosis Difficulty walking;Generalized weakness   PT Problem List Decreased strength;Decreased activity tolerance;Decreased balance;Decreased mobility;Decreased coordination;Decreased knowledge of use of DME;Decreased safety awareness  PT Treatment Interventions DME instruction;Gait training;Functional mobility training;Therapeutic activities;Therapeutic exercise;Patient/family education   PT Goals (Current goals can be found in the Care Plan section) Acute Rehab PT Goals Patient Stated Goal: get back home PT Goal Formulation: With patient Time For Goal Achievement: 10/21/15 Potential to Achieve Goals: Good    Frequency Min 3X/week   Barriers to discharge        Co-evaluation               End of Session   Activity Tolerance: Patient tolerated treatment well Patient left: in chair;with call bell/phone within reach;with family/visitor present Nurse Communication: Mobility status         Time: 9163-8466 PT Time Calculation (min) (ACUTE ONLY): 31 min   Charges:   PT Evaluation $Initial PT Evaluation Tier I: 1 Procedure PT Treatments $Therapeutic Activity: 8-22 mins   PT G Codes:        Angel Hobdy, Tessie Fass 10/07/2015, 12:47 PM  10/07/2015  Donnella Sham, [place] [phone]  (pager)

## 2015-10-07 NOTE — Progress Notes (Signed)
Inpatient Diabetes Program Recommendations  AACE/ADA: New Consensus Statement on Inpatient Glycemic Control (2015)  Target Ranges:  Prepandial:   less than 140 mg/dL      Peak postprandial:   less than 180 mg/dL (1-2 hours)      Critically ill patients:  140 - 180 mg/dL   Review of Glycemic Control:  Results for DEAZIA, LAMPI (MRN [id number]) as of 10/07/2015 14:09  Ref. Range 10/06/2015 08:02 10/06/2015 12:08 10/06/2015 19:45 10/06/2015 23:52 10/07/2015 05:02  Glucose-Capillary Latest Ref Range: 65-99 mg/dL 280 (H) 236 (H) 296 (H) 316 (H) 298 (H)  Results for TAJHA, SAMMARCO (MRN [id number]) as of 10/07/2015 14:09  Ref. Range 10/06/2015 05:01  Hemoglobin A1C Latest Ref Range: 4.8-5.6 % 12.6 (H)   Diabetes history:  No history noted Current orders for Inpatient glycemic control:  Novolog sensitive tid with meals, Novolog 4 units tid with meals, Lantus 5 units q HS-- Decadron 4 mg q 6 hours Inpatient Diabetes Program Recommendations:    A1C indicates elevated blood sugars for the past 2-3 months.  She will likely need insulin at discharge based on A1C especially if steroids continued.  Consider increasing Lantus to 14 units q HS (0.2 units/kg).    Will follow.  Thanks, Adah Perl, RN, BC-ADM Inpatient Diabetes Coordinator Pager [phone] (8a-5p)

## 2015-10-08 DIAGNOSIS — C7931 Secondary malignant neoplasm of brain: Principal | ICD-10-CM

## 2015-10-08 DIAGNOSIS — S21001S Unspecified open wound of right breast, sequela: Secondary | ICD-10-CM

## 2015-10-08 LAB — GLUCOSE, CAPILLARY
GLUCOSE-CAPILLARY: 140 mg/dL — AB (ref 65–99)
GLUCOSE-CAPILLARY: 318 mg/dL — AB (ref 65–99)
GLUCOSE-CAPILLARY: 296 mg/dL — AB (ref 65–99)
Glucose-Capillary: 403 mg/dL — ABNORMAL HIGH (ref 65–99)
Glucose-Capillary: 171 mg/dL — ABNORMAL HIGH (ref 65–99)
Glucose-Capillary: 216 mg/dL — ABNORMAL HIGH (ref 65–99)

## 2015-10-08 LAB — COMPREHENSIVE METABOLIC PANEL
ALT: 18 U/L (ref 14–54)
ANION GAP: 9 (ref 5–15)
AST: 32 U/L (ref 15–41)
Albumin: 2.3 g/dL — ABNORMAL LOW (ref 3.5–5.0)
Alkaline Phosphatase: 76 U/L (ref 38–126)
BUN: 13 mg/dL (ref 6–20)
CHLORIDE: 104 mmol/L (ref 101–111)
CO2: 24 mmol/L (ref 22–32)
CREATININE: 0.79 mg/dL (ref 0.44–1.00)
Calcium: 8.3 mg/dL — ABNORMAL LOW (ref 8.9–10.3)
GFR calc Af Amer: >60 mL/min (ref >60)
Glucose, Bld: 157 mg/dL — ABNORMAL HIGH (ref 65–99)
POTASSIUM: 3.3 mmol/L — AB (ref 3.5–5.1)
Sodium: 137 mmol/L (ref 135–145)
Total Bilirubin: 0.7 mg/dL (ref 0.3–1.2)
Total Protein: 5 g/dL — ABNORMAL LOW (ref 6.5–8.1)

## 2015-10-08 LAB — CBC
HEMATOCRIT: 32.8 % — AB (ref 36.0–46.0)
HEMOGLOBIN: 11.6 g/dL — AB (ref 12.0–15.0)
MCH: 35.5 pg — ABNORMAL HIGH (ref 26.0–34.0)
MCHC: 35.4 g/dL (ref 30.0–36.0)
MCV: 100.3 fL — ABNORMAL HIGH (ref 78.0–100.0)
Platelets: 327 10*3/uL (ref 150–400)
RBC: 3.27 MIL/uL — AB (ref 3.87–5.11)
RDW: 15 % (ref 11.5–15.5)
WBC: 3.3 10*3/uL — AB (ref 4.0–10.5)

## 2015-10-08 LAB — MAGNESIUM: Magnesium: 1.7 mg/dL (ref 1.7–2.4)

## 2015-10-08 MED ORDER — INSULIN ASPART 100 UNIT/ML ~~LOC~~ SOLN
6.0000 [IU] | Freq: Three times a day (TID) | SUBCUTANEOUS | Status: DC
  Administered 2015-10-08 – 2015-10-10 (×7): 6 [IU] via SUBCUTANEOUS

## 2015-10-08 MED ORDER — INSULIN ASPART 100 UNIT/ML ~~LOC~~ SOLN
10.0000 [IU] | Freq: Once | SUBCUTANEOUS | Status: AC
  Administered 2015-10-08: 10 [IU] via SUBCUTANEOUS

## 2015-10-08 MED ORDER — POTASSIUM CHLORIDE CRYS ER 20 MEQ PO TBCR
40.0000 meq | EXTENDED_RELEASE_TABLET | Freq: Once | ORAL | Status: AC
  Administered 2015-10-08: 40 meq via ORAL
  Filled 2015-10-08: qty 2

## 2015-10-08 MED ORDER — DEXAMETHASONE 4 MG PO TABS
4.0000 mg | ORAL_TABLET | Freq: Four times a day (QID) | ORAL | Status: DC
  Administered 2015-10-08 – 2015-10-09 (×4): 4 mg via ORAL
  Filled 2015-10-08 (×4): qty 1

## 2015-10-08 MED ORDER — DOXYCYCLINE HYCLATE 100 MG PO TABS
100.0000 mg | ORAL_TABLET | Freq: Two times a day (BID) | ORAL | Status: DC
  Administered 2015-10-08 – 2015-10-10 (×5): 100 mg via ORAL
  Filled 2015-10-08 (×5): qty 1

## 2015-10-08 MED ORDER — LEVETIRACETAM 500 MG PO TABS
500.0000 mg | ORAL_TABLET | Freq: Two times a day (BID) | ORAL | Status: DC
  Administered 2015-10-08 – 2015-10-10 (×4): 500 mg via ORAL
  Filled 2015-10-08 (×4): qty 1

## 2015-10-08 MED ORDER — INSULIN GLARGINE 100 UNIT/ML ~~LOC~~ SOLN
16.0000 [IU] | Freq: Every day | SUBCUTANEOUS | Status: DC
  Administered 2015-10-08: 16 [IU] via SUBCUTANEOUS
  Filled 2015-10-08: qty 0.16

## 2015-10-08 NOTE — Progress Notes (Signed)
Physical Therapy Treatment Patient Details Name: Mary [name] MRN: [id number] DOB: [birth year] Today's Date: 10/08/2015    History of Present Illness Mary [name] is a 59 y.o. 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  MRI shows progression of metastatic lesions    PT Comments    PT session focused on functional mobility within pt room.  Gait training using RW initiated with pt responding well though she demonstrates instability and decreased activity tolerance.  Pt requires further acute PT services to maximize safety and independence with functional mobility.  As long as family is able to set up 24 hour assistance/supervision, PT continues to recommend d/c home with HHPT.   Follow Up Recommendations  Home health PT;Supervision/Assistance - 24 hour;Other (comment) (family working to line up 24 hr assist)     Equipment Recommendations  None recommended by PT    Recommendations for Other Services       Precautions / Restrictions Precautions Precautions: Fall Restrictions Weight Bearing Restrictions: No    Mobility  Bed Mobility Overal bed mobility: Needs Assistance Bed Mobility: Sit to Supine       Sit to supine: Min assist (for LE management and VCs for scooting)   General bed mobility comments: received in chair  Transfers Overall transfer level: Needs assistance Equipment used: Rolling walker (2 wheeled) Transfers: Sit to/from Stand Sit to Stand: Min assist (VCs for hand/foot placement and to lean forward)            Ambulation/Gait Ambulation/Gait assistance: Mod assist Ambulation Distance (Feet): 15 Feet Assistive device: Rolling walker (2 wheeled) Gait Pattern/deviations: Step-through pattern;Decreased stride length;Shuffle;Leaning posteriorly   Gait velocity interpretation: Below normal speed for  age/gender General Gait Details: Unsteady with gait especially with increasing fatigue with tendency to lean posteriorly.  Pt requiring cues for weight shift and to prevent posterior lean. Pt with decreased activity tolerance.   Stairs            Wheelchair Mobility    Modified Rankin (Stroke Patients Only)       Balance Overall balance assessment: Needs assistance Sitting-balance support: No upper extremity supported;Feet supported Sitting balance-Leahy Scale: Fair     Standing balance support: During functional activity;Bilateral upper extremity supported Standing balance-Leahy Scale: Poor Standing balance comment: Reliance on UE through RW.  Pt was able to achieve standing with +1 HHA but with pt demonstrating decreased balance and pt noting that she did not feel steady.                    Cognition Arousal/Alertness: Awake/alert Behavior During Therapy: WFL for tasks assessed/performed Overall Cognitive Status: Impaired/Different from baseline Area of Impairment: Attention;Following commands;Safety/judgement;Problem solving   Current Attention Level: Sustained   Following Commands: Follows one step commands inconsistently;Follows one step commands with increased time Safety/Judgement: Decreased awareness of safety;Decreased awareness of deficits   Problem Solving: Slow processing;Requires verbal cues      Exercises      General Comments General comments (skin integrity, edema, etc.): Pt very fatigued by the end of the session.        Pertinent Vitals/Pain Pain Assessment: Faces Pain Location: R breast area Pain Descriptors / Indicators: Grimacing Pain Intervention(s): Monitored during session    Home Living                      Prior Function  PT Goals (current goals can now be found in the care plan section) Acute Rehab PT Goals Patient Stated Goal: get back home PT Goal Formulation: With patient Time For Goal  Achievement: 10/21/15 Potential to Achieve Goals: Good Progress towards PT goals: Progressing toward goals    Frequency  Min 3X/week    PT Plan Current plan remains appropriate    Co-evaluation             End of Session Equipment Utilized During Treatment: Gait belt Activity Tolerance: Patient tolerated treatment well Patient left: in bed;with call bell/phone within reach;with bed alarm set     Time: 1049-1106 PT Time Calculation (min) (ACUTE ONLY): 17 min  Charges:  $Therapeutic Activity: 8-22 mins                    G Codes:      Takeila Thayne October 12, 2015, 12:14 PM  Lorita Officer, SPT

## 2015-10-08 NOTE — Progress Notes (Signed)
[facility] PROGRESS NOTE  ANYIAH COVERDALE GMW:[id number] DOB: Jul 09, 1956 DOA: 10/05/2015 PCP: [name], MD  HPI 59 y/o female followed by [name] for triple positive breast CA w/ Brain mets diagnosed 03/2014 w/ multiple irradiative fractions to the brain by [name] not amenable to stereotactic surgery. She was supposed to have been follwed at [facility] breast cancer brain met clinic s/p 5 cycles carboplatin/gemzar/perjeta/herceptin. She presented from home 10/9 with recurring seizures.  Subjective: Today the patient is doing well, sitting up in her chair eating breakfast. Denies fever, chills, abdominal pain, N/V/D.   Assessment/Plan: Principal Problem:  Generalized Seizure, new onset: Secondary to metastasized breast cancer to the brain. Neurology consulted and initiated Keppra without complications. UE doppler shows no evidence of DVT. No evidence of recurrent seizures. PT consulted and recommends home health PT. Continue IV Keppra and monitor.    Active Problems:  Hyperglycemia: 528 on admission, secondary to steroid use. A1c is 12.6. Initiated sliding scale insulin, IVF, and 5 mg QD Lantus on admission without complications. CBGs continue to improve (157 today). Initiated chronic management of hyperglycemia with Lantus 16u QD and Novolog 6u TID today. Patient educated on insulin use. She will need to remain on insulin therapy on discharge.  Metastatic Breast Cancer: MRI of brain indicates progressive metastatic disease. Per oncology recommendation, we will continue Aromasin, Ibrance, and Decadron. Revived outpatient radiation oncology note from 8/18, unable to provide any additional treatment at that time.   Right breast wound: Chronic, open wound. Secondary to breast cancer treatment. Evaluated by [facility]- continue dressings per WOC recommendations. Improving, discontinued Levaquin d/t risk of seizures and initiated Doxycycline. Continue to monitor.   Pseudohyponatremia: 130 on  admission, secondary to hypoglycemia. Resolved with correction of hyperglycemia.  Hypokalemia: Magnesium WNL, Potassium decreased to 3.3. Replete and monitor.   Leukopenia: Chronic, secondary to breast cancer treatment. Stable, continue to monitor.  Lactic Acidosis: 4.0 on admission, resolved with IVF.   Hypertension: Chronic. Currently stable. Continue hydralazine PRN orders and monitor.   Code Status: Full  Family Communication: Husband at bedside Disposition Plan: Remain inpatient  DVT Prophylaxis: Lovenox   Consultants:  Neurology   Oncology   Procedures:  UE venous doppler   Antibiotics:  Aztreonam 10/8 > 10/10  Vancomycin 10/8 > 10/10  Levaquin 10/10 > 10/11  Doxycycline 10/11 >     Objective: Filed Vitals:   10/08/15 0915  BP: 131/82  Pulse: 104  Temp: 97.9 F (36.6 C)  Resp: 18    Intake/Output Summary (Last 24 hours) at 10/08/15 1028 Last data filed at 10/07/15 1900  Gross per 24 hour  Intake 2871.66 ml  Output    550 ml  Net 2321.66 ml   Filed Weights   10/06/15 0200 10/06/15 0411  Weight: 73.5 kg (162 lb 0.6 oz) 69.5 kg (153 lb 3.5 oz)    Exam:   General:  Frail-appearing woman, sitting in chair eating breakfast, no acute distress  Cardiovascular: RRR, no m/r/g. No peripheral edema  Respiratory: CTA b/l. No wheeze or crackles  Abdomen: Soft, non-tender, non-distended. +BS  Neuro: A&Ox3, grip strength equal bilaterally. Appropriate movement of UE/LE, slight left sided weakness  Skin: Ulceration on R outer breast (3cm x 5cm x 0.5cm, 85% erythematous granular base, 15% necrotic with pus)   Data Reviewed: Basic Metabolic Panel:  Recent Labs Lab 10/06/15 0108 10/06/15 0501 10/08/15 0532  NA 130* 138 137  K 3.8 3.3* 3.3*  CL 95* 101 104  CO2 24 25 24  GLUCOSE 528* 202* 157*  BUN _0 CREATININE 0.82 0.67 0.79  CALCIUM 8.8* 8.4* 8.3*  MG  --   --  1.7   Liver Function Tests:  Recent Labs Lab Oct 24, 2015 0501  10/08/15 0532  AST 26 32  ALT 16 18  ALKPHOS 71 76  BILITOT 0.7 0.7  PROT 4.9* 5.0*  ALBUMIN 2.2* 2.3*   No results for input(s): LIPASE, AMYLASE in the last 168 hours. No results for input(s): AMMONIA in the last 168 hours. CBC:  Recent Labs Lab 2015/10/24 0108 2015/10/24 0501 10/08/15 0532  WBC 2.6* 2.7* 3.3*  NEUTROABS 2.1  --   --   HGB 11.4* 10.2* 11.6*  HCT 33.2* 30.3* 32.8*  MCV 101.5* 100.7* 100.3*  PLT 243 218 327   Cardiac Enzymes: No results for input(s): CKTOTAL, CKMB, CKMBINDEX, TROPONINI in the last 168 hours. BNP (last 3 results) No results for input(s): BNP in the last 8760 hours.  ProBNP (last 3 results) No results for input(s): PROBNP in the last 8760 hours.  CBG:  Recent Labs Lab 10/07/15 1737 10/07/15 2212 10/08/15 0129 10/08/15 0603 10/08/15 0653  GLUCAP 360* 429* 403* 171* 140*    Recent Results (from the past 240 hour(s))  Blood culture (routine x 2)     Status: None (Preliminary result)   Collection Time: 10/24/15  2:35 AM  Result Value Ref Range Status   Specimen Description BLOOD LEFT CHEST  Final   Special Requests BOTTLES DRAWN AEROBIC AND ANAEROBIC 10CC  Final   Culture NO GROWTH 1 DAY  Final   Report Status PENDING  Incomplete  MRSA PCR Screening     Status: None   Collection Time: 24-Oct-2015  4:10 AM  Result Value Ref Range Status   MRSA by PCR NEGATIVE NEGATIVE Final    Comment:        The GeneXpert MRSA Assay (FDA approved for NASAL specimens only), is one component of a comprehensive MRSA colonization surveillance program. It is not intended to diagnose MRSA infection nor to guide or monitor treatment for MRSA infections.   Blood culture (routine x 2)     Status: None (Preliminary result)   Collection Time: 10/24/2015  5:01 AM  Result Value Ref Range Status   Specimen Description BLOOD RIGHT ARM  Final   Special Requests IN PEDIATRIC BOTTLE 3CC  Final   Culture NO GROWTH 1 DAY  Final   Report Status PENDING   Incomplete     Studies: Mr Kizzie Fantasia Contrast  10-24-15   CLINICAL DATA:  Seizure.  Known brain metastases.  EXAM: MRI HEAD WITHOUT AND WITH CONTRAST  TECHNIQUE: Multiplanar, multiecho pulse sequences of the brain and surrounding structures were obtained without and with intravenous contrast.  CONTRAST:  15 mL MultiHance  COMPARISON:  CT head without contrast from the same day. MRI brain 08/09/2015.  FINDINGS: Multiple enhancing metastatic lesions of the brain have increased in size. Several of these lesions are hemorrhagic. Acute and chronic blood products are evident within a 14 mm lesion in the right frontal operculum. This lesion previously measured 11 mm. Multiple lesions in the cerebellum have increased in size. The largest right-sided lesions both now measure 2.0 cm in long axis. Confluent lesions along the left side of the superior vermis measure 2.4 x 2.0 cm on axial images. This area previously measured 2.4 x 1.1 cm. On sagittal images there were 4 discrete lesions to the left midline in the cerebellum previously. These lesions are  now all nearly confluent.  Associated T2 signal changes about these lesions has increased. There is also confluent periventricular T2 change bilaterally compatible with prior radiation therapy.  No subacute infarct is present. There is focal restricted diffusion within a lesion in the medial right occipital lobe on image 23 of series 3. This can be seen in aggressive compact tumors. There is no acute hemorrhage at this lesion.  The basal cisterns and ventricles are intact. There is no herniation. No significant extra-axial fluid collection is present.  Flow is present in the major intracranial arteries. The globes and orbits are intact. A fluid level is present in the left maxillary sinus with circumferential mucosal thickening. Disease in the left maxillary sinus has progressed. Left ethmoid disease has progressed as well.  IMPRESSION: 1. Progression in size of multiple  enhancing metastatic lesions throughout the brain as described. 2. Several of these lesions are hemorrhagic with remote blood products similar to the prior study. 3. Both acute and remote blood products are evident in the right operculum. 4. Increased diffuse T2 signal changes associated with individual lesions and about the ventricles. This represents a progression of disease as well as the sequelae of whole brain radiation.   Electronically Signed   By: San Morelle M.D.   On: 10/06/2015 17:54    Scheduled Meds: . dexamethasone  4 mg Intravenous 4 times per day  . enoxaparin (LOVENOX) injection  40 mg Subcutaneous Q24H  . exemestane  25 mg Oral QPC breakfast  . insulin aspart  0-9 Units Subcutaneous TID WC  . insulin aspart  6 Units Subcutaneous TID WC  . insulin glargine  16 Units Subcutaneous QHS  . levETIRAcetam  500 mg Intravenous Q12H  . levofloxacin  500 mg Oral Daily  . palbociclib  125 mg Oral Q breakfast  . pantoprazole  40 mg Oral BID   Continuous Infusions: . sodium chloride 50 mL/hr at 10/07/15 1736    Principal Problem:   Seizure [facility]) Active Problems:   Hypertension   Migraine headache   Breast cancer metastasized to multiple sites [facility])   Wound of right breast   Hyperglycemia   Wound infection ([place])      Pedrohenrique M[place]on[place], Student-PA [facility] If 7PM-7AM, please contact night-coverage at [email or web address], password [facility] 10/08/2015, 10:28 AM  LOS: 2 days

## 2015-10-08 NOTE — Progress Notes (Signed)
Mary [name] appears a little bit more alert. Maybe this is because her blood sugars are little better. Her blood sugar this morning is 157.  She had Dopplers of her upper extremities yesterday. They were negative for any thromboembolic disease.  Physical therapy has seen her.  Her labs look dry stable. Her white cell count is 3.3. Hemoglobin 11.6. Platelet count 327. Her calcium is 8.3 with an albumin of 2.3.  I'm going to check her prealbumin. I think this would be interesting.  She is supposed to have an appointment at [facility] tomorrow for the brain metastasis clinic. Somehow, I don't think she will make this.  She seems to be eating a little better according to her husband.  She does not have any obvious pain. The my be some slight pain in the right upper quadrant.  On her physical exam, her vital signs temperature 98.6. Pulse 93. Blood pressure 138/72. Head and neck exam shows no ocular or oral lesions. She has no thrush. There is no adenopathy in the neck. Lungs are slightly decreased at the bases. Cardiac exam is regular rate and rhythm with no murmurs. Abdomen is soft. There is some slight tenderness in the lower quadrant to palpation. I cannot palpate her liver. There is no fluid wave in the abdomen. Extremities shows maybe some trace edema in her lower legs. Skin exam shows no rashes. Neurological exam shows no focal neurological deficits.  From my point of view, I just don't think we have anything that we can offer her systemically. Her problem is the brain metastases. This is what will define her mortality. I'm just not sure what else can be done for the brain metastases. It will be nice if she can make it to that appointment. It is probable that this will have to be postponed.  I'll a blood sugars are doing better. I think this will help her overall cognitive function.  Getting physical therapy involved is critical. Hopefully they can work with her to try to get her to be a  little bit more functional.  I appreciate all the great care that she is getting from everybody upon 66M!!  Ramond Dial 1:37

## 2015-10-08 NOTE — Progress Notes (Signed)
CBG video on Patient Education viewed by patient and family at bedside.  Aldona Bar, RN

## 2015-10-08 NOTE — Progress Notes (Signed)
Occupational Therapy Evaluation Patient Details Name: Mary [name] MRN: [id number] DOB: 1956/09/18 Today's Date: 10/08/2015    History of Present Illness Mary [name] is a 59 y.o. 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  MRI shows progression of metastatic lesions   Clinical Impression   Pt admitted with the above diagnoses and presents with below problem list. Pt will benefit from continued acute OT to address the below listed deficits and maximize independence with BADLs prior to returning home with 24 hour assistance. PTA pt was supervision for ADLs. Pt is currently min to mod A with ADLs. Pt completed UB/LB bathing and dressing during session. Session details below. OT to continue to follow acutely.      Follow Up Recommendations  Home health OT;Supervision/Assistance - 24 hour ; would also benefit from [facility] aide   Equipment Recommendations  None recommended by OT    Recommendations for Other Services       Precautions / Restrictions Precautions Precautions: Fall Restrictions Weight Bearing Restrictions: No      Mobility Bed Mobility Overal bed mobility: Needs Assistance Bed Mobility: Sit to Supine;Supine to Sit     Supine to sit: Mod assist Sit to supine: Min assist   General bed mobility comments: cues for sequencing for supine>EOB also mod A to advance trunk  Transfers Overall transfer level: Needs assistance Equipment used: Rolling walker (2 wheeled) Transfers: Sit to/from Stand Sit to Stand: Min assist         General transfer comment: min A for balance and power up as pt fatigued during session.    Balance Overall balance assessment: Needs assistance Sitting-balance support: No upper extremity supported;Feet supported Sitting balance-Leahy Scale: Fair     Standing balance support: Bilateral upper  extremity supported;During functional activity Standing balance-Leahy Scale: Poor Standing balance comment: external support especially for dynamic standing balance                            ADL Overall ADL's : Needs assistance/impaired Eating/Feeding: Set up;Sitting   Grooming: Wash/dry face;Wash/dry hands;Set up;Sitting   Upper Body Bathing: Cueing for sequencing;Cueing for compensatory techniques;Sitting;Minimal assitance Upper Body Bathing Details (indicate cue type and reason): cues for safety to avoid lines and right breast wound. Cues for sequencing at times. Cues for task completion. Therapist washed pt's back and right armpit Lower Body Bathing: Minimal assistance;Sit to/from stand;Cueing for sequencing;Cueing for compensatory techniques Lower Body Bathing Details (indicate cue type and reason): cues for sequencing and task completion; able to access feet in sitting position with increased time Upper Body Dressing : Minimal assistance   Lower Body Dressing: Minimal assistance;Sit to/from stand;Cueing for compensatory techniques Lower Body Dressing Details (indicate cue type and reason): able to access feet in sitting position with increased time; min A for dynamic balance in standing position Toilet Transfer: Stand-pivot;BSC;RW;Moderate assistance   Toileting- Clothing Manipulation and Hygiene: Minimal assistance;Sit to/from Nurse, children's Details (indicate cue type and reason): not assessed due to pt with increased nausea/weakness at end of session after sitting rest break provided Functional mobility during ADLs:  (not assessed due to pt with increased nausea/weakness at end) General ADL Comments: Pt indicating she would like to bathe upon therapist arrival. Pt completed UB/LB bathing and dressing in sit<>stand at EOB with increased time and min A (detailed above).  Discussed energy conservation strategies with pt and sister. Pt needing cues at times.  Perseveration/decreased STM noted.Pt fatigued and reporting some nausea at end of session. No emesis.     Vision     Perception     Praxis      Pertinent Vitals/Pain Pain Assessment: 0-10 Pain Score: 6  Pain Location: headache Pain Descriptors / Indicators: Aching;Headache Pain Intervention(s): Limited activity within patient's tolerance;Monitored during session;Repositioned     Hand Dominance     Extremity/Trunk Assessment Upper Extremity Assessment Upper Extremity Assessment: Generalized weakness   Lower Extremity Assessment Lower Extremity Assessment: Generalized weakness;Defer to PT evaluation       Communication Communication Communication: No difficulties (mildly dysarthric, but intelligible)   Cognition Arousal/Alertness: Awake/alert Behavior During Therapy: WFL for tasks assessed/performed Overall Cognitive Status: Impaired/Different from baseline Area of Impairment: Attention;Following commands;Safety/judgement;Problem solving   Current Attention Level: Sustained Memory: Decreased short-term memory;Decreased recall of precautions Following Commands: Follows one step commands inconsistently;Follows one step commands with increased time Safety/Judgement: Decreased awareness of safety;Decreased awareness of deficits   Problem Solving: Slow processing;Requires verbal cues;Difficulty sequencing     General Comments       Exercises       Shoulder Instructions      Home Living Family/patient expects to be discharged to:: Private residence Living Arrangements: Spouse/significant other Available Help at Discharge: Family;Other (Comment) (up to 24/7, sister days and husband nights) Type of Home: House Home Access: Level entry     Home Layout: One level     Bathroom Shower/Tub: Tub/shower unit;Curtain Shower/tub characteristics: Architectural technologist: Standard     Home Equipment: Environmental consultant - 2 wheels;Bedside commode;Shower seat   Additional  Comments: sister is installing hand held shower      Prior Functioning/Environment Level of Independence: Needs assistance    ADL's / Homemaking Assistance Needed: most of the time supervision, with start of seizure-like activity last week sister was assisting with bathing         OT Diagnosis: Generalized weakness;Cognitive deficits;Disturbance of vision;Other (comment) (decreased balance)   OT Problem List: Decreased strength;Decreased activity tolerance;Impaired balance (sitting and/or standing);Impaired vision/perception;Decreased coordination;Decreased cognition;Decreased safety awareness;Decreased knowledge of use of DME or AE;Decreased knowledge of precautions;Pain   OT Treatment/Interventions: Self-care/ADL training;Therapeutic exercise;Energy conservation;DME and/or AE instruction;Therapeutic activities;Patient/family education;Balance training;Cognitive remediation/compensation    OT Goals(Current goals can be found in the care plan section) Acute Rehab OT Goals Patient Stated Goal: get back home OT Goal Formulation: With patient/family Time For Goal Achievement: 10/22/15 Potential to Achieve Goals: Good ADL Goals Pt Will Perform Upper Body Bathing: with modified independence;sitting Pt Will Perform Lower Body Bathing: with supervision;sit to/from stand Pt Will Perform Upper Body Dressing: with modified independence;sitting Pt Will Perform Lower Body Dressing: with supervision;sit to/from stand Pt Will Transfer to Toilet: with supervision;ambulating;bedside commode Pt Will Perform Toileting - Clothing Manipulation and hygiene: with supervision;sit to/from stand Pt Will Perform Tub/Shower Transfer: Tub transfer;with min guard assist;ambulating;shower seat;rolling walker  OT Frequency: Min 2X/week   Barriers to D/C:    pt's family working to arrange 24hr care. Hopes to obtain [facility] aide services       Co-evaluation              End of Session Equipment Utilized  During Treatment: Gait belt;Rolling walker  Activity Tolerance: Patient limited by fatigue;Patient tolerated treatment well Patient left: in bed;with call bell/phone within reach;with bed alarm set;with family/visitor present   Time: 3474-2595 OT Time Calculation (min): 48 min Charges:  OT General  Charges $OT Visit: 1 Procedure OT Evaluation $Initial OT Evaluation Tier I: 1 Procedure OT Treatments $Self Care/Home Management : 23-37 mins G-Codes:    Hortencia Pilar 11-05-2015, 2:13 PM

## 2015-10-08 NOTE — Progress Notes (Signed)
Pt CBG was 429. MD notified, new orders given.  Will continue to monitor.  Fredrich Romans, RN

## 2015-10-08 NOTE — Care Management Note (Signed)
Case Management Note  Patient Details  Name: Mary [name] MRN: [id number] Date of Birth: 08/17/1956  Subjective/Objective:                    Action/Plan: CM met with patient and her family to discuss discharge plans. Per MD patient will be ready for discharge in next 24-48 hours. Patients sister requesting patient have [facility] services at discharge. CM spoke with Malachy Mood at [facility] and they do not provide PT service but they do provide RN and aide services for Medicaid patients. CM informed the patient and her sister that [facility] did not have PT services and they asked that I call [facility] to see if they could provide the [facility] PT. CM spoke with Miranda with [facility] and she stated that Medicaid would not cover [facility] PT with the patient's current diagnosis. CM informed the patient and her sister that [facility] could not provide PT either and they chose to use [facility] for RN and aide services. CM spoke with Hassan Rowan at the [place] office for [place] and faxed her the required information. Hassan Rowan is to look at the information in the am and contact CM if there are any concerns or questions with the referral. CM will continue to follow for any further discharge needs.   Expected Discharge Date:  10/09/15               Expected Discharge Plan:  [place]  In-House Referral:     Discharge planning Services  CM Consult  Post Acute Care Choice:    Choice offered to:  Patient, Sibling ([place]  )  DME Arranged:    DME Agency:     HH Arranged:  RN, NA HH Agency:   ([facility])  Status of Service:  In process, will continue to follow  Medicare Important Message Given:    Date Medicare IM Given:    Medicare IM give by:    Date Additional Medicare IM Given:    Additional Medicare Important Message give by:     If discussed at [place] of Stay Meetings, dates discussed:    Additional  Comments:  Pollie Friar, RN 10/08/2015, 2:52 PM

## 2015-10-09 DIAGNOSIS — S21001A Unspecified open wound of right breast, initial encounter: Secondary | ICD-10-CM

## 2015-10-09 LAB — BASIC METABOLIC PANEL
ANION GAP: 10 (ref 5–15)
BUN: 11 mg/dL (ref 6–20)
CHLORIDE: 102 mmol/L (ref 101–111)
CO2: 25 mmol/L (ref 22–32)
Calcium: 8.5 mg/dL — ABNORMAL LOW (ref 8.9–10.3)
Creatinine, Ser: 0.61 mg/dL (ref 0.44–1.00)
GFR calc Af Amer: >60 mL/min (ref >60)
GFR calc non Af Amer: >60 mL/min (ref >60)
Glucose, Bld: 246 mg/dL — ABNORMAL HIGH (ref 65–99)
POTASSIUM: 3.4 mmol/L — AB (ref 3.5–5.1)
SODIUM: 137 mmol/L (ref 135–145)

## 2015-10-09 LAB — GLUCOSE, CAPILLARY
GLUCOSE-CAPILLARY: 233 mg/dL — AB (ref 65–99)
Glucose-Capillary: 242 mg/dL — ABNORMAL HIGH (ref 65–99)
Glucose-Capillary: 349 mg/dL — ABNORMAL HIGH (ref 65–99)
Glucose-Capillary: 228 mg/dL — ABNORMAL HIGH (ref 65–99)

## 2015-10-09 MED ORDER — DEXAMETHASONE 4 MG PO TABS
4.0000 mg | ORAL_TABLET | Freq: Three times a day (TID) | ORAL | Status: DC
  Administered 2015-10-09 – 2015-10-10 (×3): 4 mg via ORAL
  Filled 2015-10-09 (×3): qty 1

## 2015-10-09 MED ORDER — INSULIN GLARGINE 100 UNIT/ML ~~LOC~~ SOLN
20.0000 [IU] | Freq: Every day | SUBCUTANEOUS | Status: DC
  Administered 2015-10-09: 20 [IU] via SUBCUTANEOUS
  Filled 2015-10-09 (×2): qty 0.2

## 2015-10-09 MED ORDER — OXYCODONE HCL 5 MG PO TABS: 5.0000 mg | ORAL_TABLET | Freq: Four times a day (QID) | ORAL | Status: DC | PRN

## 2015-10-09 MED ORDER — DEXAMETHASONE 4 MG PO TABS: 4.0000 mg | ORAL_TABLET | Freq: Three times a day (TID) | ORAL | Status: DC

## 2015-10-09 MED ORDER — PANTOPRAZOLE SODIUM 40 MG PO TBEC: 40.0000 mg | DELAYED_RELEASE_TABLET | Freq: Two times a day (BID) | ORAL | Status: DC

## 2015-10-09 MED ORDER — POTASSIUM CHLORIDE CRYS ER 20 MEQ PO TBCR: 20.0000 meq | EXTENDED_RELEASE_TABLET | Freq: Every day | ORAL | Status: DC

## 2015-10-09 MED ORDER — POTASSIUM CHLORIDE CRYS ER 20 MEQ PO TBCR
40.0000 meq | EXTENDED_RELEASE_TABLET | Freq: Two times a day (BID) | ORAL | Status: DC
  Administered 2015-10-09 – 2015-10-10 (×3): 40 meq via ORAL
  Filled 2015-10-09 (×3): qty 2

## 2015-10-09 MED ORDER — LEVETIRACETAM 500 MG PO TABS: 500.0000 mg | ORAL_TABLET | Freq: Two times a day (BID) | ORAL | Status: DC

## 2015-10-09 MED ORDER — INSULIN ASPART 100 UNIT/ML FLEXPEN: PEN_INJECTOR | SUBCUTANEOUS | Status: DC

## 2015-10-09 MED ORDER — INSULIN PEN NEEDLE 32G X 8 MM MISC: Status: DC

## 2015-10-09 MED ORDER — INSULIN GLARGINE 100 UNIT/ML SOLOSTAR PEN: 20.0000 [IU] | PEN_INJECTOR | Freq: Every day | SUBCUTANEOUS | Status: DC

## 2015-10-09 MED ORDER — DOXYCYCLINE HYCLATE 100 MG PO TABS: 100.0000 mg | ORAL_TABLET | Freq: Two times a day (BID) | ORAL | Status: DC

## 2015-10-09 MED ORDER — FREESTYLE SYSTEM KIT: 1.0000 | PACK | Freq: Three times a day (TID) | Status: DC

## 2015-10-09 MED ORDER — ONDANSETRON HCL 8 MG PO TABS: 8.0000 mg | ORAL_TABLET | Freq: Two times a day (BID) | ORAL | Status: DC

## 2015-10-09 NOTE — Progress Notes (Signed)
Mary [name] from [facility] called back and stated they were not going to be able to accept Mary [name] d/t paper work not completed with in 61 hours of d/c. CM never received paper work on patient. When CM spoke with Mary [name] yesterday she said she had faxed paperwork over, but thought it might be on a different patient. This am CM found paper work from [facility] in the patient's chart. CM spoke with the family and they are requesting to use [facility]. CM spoke with Mary [name] with [facility] and she accepted the referral. CM sent Mary [name] the sisters phone number as the primary contact number. Bedside RN updated.

## 2015-10-09 NOTE — Discharge Summary (Addendum)
Physician Discharge Summary  Mary [name] LFY:[id number] DOB: Apr 05, 1956 DOA: 10/05/2015  PCP: [name], MD  Admit date: 10/05/2015 Discharge date: 10/10/2015  Time spent: 35 minutes  Recommendations for Outpatient Follow-up:  Optimize Insulin therapy (Lantus 20u QD, Novolog sliding scale TID, CBGs TID) FU with neurology in 1-2 weeks following new-onset seizures in setting of metastatic breast cancer to the brain.  Patient discharged on Keppra 500 mg BID and reminded not to drive or opperate heavy machinery until discharged by outpatient neurology.   FU with PCP regarding the following  : Patient has hyperglycemia secondary to chronic steroid use in the setting of metastatic breast cancer to the brain. A1c 12.6. Patient was started on insulin therapy on discharge (Lantus 20u QD, Novolog sliding scale TID, CBGs TID). Instructed patient to keep diary of daily blood sugars  . Please repeat A1c in 3 months and adjust insulin therapy PRN. Patient experienced hyponatremia (secondary to hyperglycemia) and hyperkalemia during hospitalization, both corrected on discharge. Please repeat BMP in 2 weeks.   Discharge Diagnoses:  Principal Problem:   Seizure [facility]) Active Problems:   Hypertension   Migraine headache   Breast cancer metastasized to multiple sites [facility])   Wound of right breast   Hyperglycemia   Wound infection ([place])   Metastatic cancer to brain [facility])   Discharge Condition: Stable for discharge   Diet recommendation: Carb modified diet   Filed Weights   10/06/15 0200 10/06/15 0411  Weight: 73.5 kg (162 lb 0.6 oz) 69.5 kg (153 lb 3.5 oz)    History of present illness:  59 y/o female followed by [name] for triple positive breast CA w/ Brain mets diagnosed 03/2014 w/ multiple irradiative fractions to the brain by [name] not amenable to stereotactic surgery. She was supposed to have been follwed at [facility] breast cancer brain met clinic s/p 5 cycles  carboplatin/gemzar/perjeta/herceptin. She presented from home 10/9 with recurring seizures.  Hospital Course:  Principal Problem:  Generalized Seizure, new onset: Secondary to metastasized breast cancer to the brain. Neurology consulted and initiated Keppra without complications. UE doppler shows no evidence of DVT. No evidence of recurrent seizures during hospitalization. PT recommends home health on discharge. Continue Keppra 500 mg BID on discharge and FU with outpatient neurology. Patient is unable to drive, operate heavy machinery, perform activities at heights or participate in water activities until release by outpatient physician  Active Problems:  Type 2 diabetes with uncontrolled hyperglycemia: 528 on admission, secondary to steroid use. A1c is 12.6. Initially initiated sliding scale insulin, IVF, and 5 mg QD Lantus on admission without complications. CBGs continued to improve throughout stay, thus we initiated chronic management of hyperglycemia with Lantus and Novolog. Patient was educated on insulin use as well as s/s of hypoglycemia. Continue Lantus 20u QD and sliding scale Novolog TID on discharge. FU with PCP for chronic management of hyperglycemia.   Metastatic Breast Cancer: MRI of brain indicated progressive metastatic disease. Per oncology recommendation, we continued Aromasin, Ibrance, and Decadron during hospitalization. Revived outpatient radiation oncology note from 8/18, unable to provide any additional treatment at that time. Was subsequently seen by radiation oncology. Continue current medication regimen and FU with oncology and radiation oncology as instructed.  Right breast wound: Chronic open wound secondary to breast cancer treatment. Evaluated by [place] during hospitalization and required BID saline dressings. Improved throughout hospitalization with antibiotics. Continue Doxycycline for 3 more days (last dose on 10/12/15) on discharge and daily dressing changes by home  health nurse.  Pseudohyponatremia: 130 on admission, secondary to hypoglycemia. Resolved with correction of hyperglycemia.  Hypokalemia: Magnesium WNL, Potassium decreased to 3.3. Continued to improve with repletion. Encouraged PO intake and FU with PCP on discharge.   Leukopenia: Chronic, secondary to breast cancer treatment. Stable during hospitalization. Continue to FU with oncology for management and monitoring on discharge.  Lactic Acidosis: 4.0 on admission, resolved with IVF.   Hypertension: Chronic. Remained stable throughout hospitalization.  FU with PCP for appropriate management.   Procedures:  UE venous doppler   Consultations:  Neurology  Oncology   Discharge Exam: Filed Vitals:   10/10/15 0949  BP: 131/87  Pulse: 98  Temp: 97.7 F (36.5 C)  Resp: 20     General: Ill-appearing woman, laying in bed, no acute distress  Cardiovascular: RRR, no m/r/g. No peripheral edema  Respiratory: CTA b/l. No wheeze or crackles  Abdomen: Soft, non-tender, non-distended.   Neuro: A&Ox3, grip strength equal bilaterally. Appropriate movement of UE/LE, slight left sided weakness  Skin: Ulceration on R outer breast (3cm x 5cm x 0.5cm, 85% erythematous granular base, 15% necrotic with pus)   Discharge Instructions  Discharge Instructions    Ambulatory referral to Neurology    Complete by:  As directed   An appointment is requested in approximately: 2 weeks     Call MD for:    Complete by:  As directed   seizures     Diet - low sodium heart healthy    Complete by:  As directed      Diet Carb Modified    Complete by:  As directed      Increase activity slowly    Complete by:  As directed           Current Discharge Medication List    START taking these medications   Details  doxycycline (VIBRA-TABS) 100 MG tablet Take 1 tablet (100 mg total) by mouth every 12 (twelve) hours. 3 more days from 10/09/15 Qty: 6 tablet, Refills: 0    glucose monitoring kit  (FREESTYLE) monitoring kit 1 each by Does not apply route 4 (four) times daily - after meals and at bedtime. 1 month Diabetic Testing Supplies for QAC-QHS accuchecks. Qty: 1 each, Refills: 1    insulin aspart (NOVOLOG FLEXPEN) 100 UNIT/ML FlexPen 0-9 Units, Subcutaneous, 3 times daily with meals CBG < 70: call MD CBG 70 - 120: 0 units CBG 121 - 150: 1 unit CBG 151 - 200: 2 units CBG 201 - 250: 3 units CBG 251 - 300: 5 units CBG 301 - 350: 7 units CBG 351 - 400: 9 units CBG > 400: call MD  Okay to substitute with different band at equivalent dosing Qty: 15 mL, Refills: 0    Insulin Glargine (LANTUS SOLOSTAR) 100 UNIT/ML Solostar Pen Inject 20 Units into the skin daily at 10 pm. Okay to substitute with different band at equivalent dosing Qty: 15 mL, Refills: 0    Insulin Pen Needle 32G X 8 MM MISC Please provide 2 month supply. Okay to substitute with different brand Qty: 100 each, Refills: 0    levETIRAcetam (KEPPRA) 500 MG tablet Take 1 tablet (500 mg total) by mouth 2 (two) times daily. Qty: 60 tablet, Refills: 0      CONTINUE these medications which have CHANGED   Details  dexamethasone (DECADRON) 4 MG tablet Take 1 tablet (4 mg total) by mouth 3 (three) times daily. Qty: 90 tablet, Refills: 0   Associated Diagnoses: Brain metastases ([place]); Breast cancer  metastasized to multiple sites, unspecified laterality ([place]); Breast cancer metastasized to multiple sites, right (HCC)    ondansetron (ZOFRAN) 8 MG tablet Take 1 tablet (8 mg total) by mouth 2 (two) times daily. Start the day after chemo for 3 days. Then take as needed for nausea or vomiting. Qty: 20 tablet, Refills: 0   Associated Diagnoses: Breast cancer metastasized to multiple sites, right (HCC)    oxyCODONE (OXY IR/ROXICODONE) 5 MG immediate release tablet Take 1-3 tablets (5-15 mg total) by mouth every 6 (six) hours as needed for severe pain. Take 1-2 if needed for pain due to cancer. Qty: 25 tablet, Refills: 0    Associated Diagnoses: Breast cancer metastasized to multiple sites, unspecified laterality ([place]); Brain metastases ([place]); Breast cancer metastasized to multiple sites, right (HCC)    pantoprazole (PROTONIX) 40 MG tablet Take 1 tablet (40 mg total) by mouth 2 (two) times daily. Qty: 60 tablet, Refills: 0   Associated Diagnoses: Iron deficiency anemia; Breast cancer metastasized to multiple sites, right (HCC)    potassium chloride SA (K-DUR,KLOR-CON) 20 MEQ tablet Take 1 tablet (20 mEq total) by mouth daily. Qty: 30 tablet, Refills: 0   Associated Diagnoses: Breast cancer metastasized to multiple sites, right ([place]); Brain metastases ([place])      CONTINUE these medications which have NOT CHANGED   Details  exemestane (AROMASIN) 25 MG tablet Take 1 tablet (25 mg total) by mouth daily after breakfast. Qty: 30 tablet, Refills: 6   Associated Diagnoses: Peripheral neuropathy ([place]); Breast cancer metastasized to multiple sites, left (HCC)    gabapentin (NEURONTIN) 300 MG capsule Take 1 capsule (300 mg total) by mouth 3 (three) times daily. Qty: 90 capsule, Refills: 4   Associated Diagnoses: Peripheral neuropathy ([place]); Breast cancer metastasized to multiple sites, left (HCC)    palbociclib (IBRANCE) 125 MG capsule Take whole with food every day for 21 days and then off for 7 days Qty: 21 capsule, Refills: 4   Associated Diagnoses: Peripheral neuropathy ([place]); Breast cancer metastasized to multiple sites, left ([place])      STOP taking these medications     fluconazole (DIFLUCAN) 100 MG tablet      amLODipine (NORVASC) 5 MG tablet      chlorhexidine (PERIDEX) 0.12 % solution      dronabinol (MARINOL) 2.5 MG capsule      escitalopram (LEXAPRO) 20 MG tablet      lisinopril (PRINIVIL,ZESTRIL) 40 MG tablet      LORazepam (ATIVAN) 0.5 MG tablet      prochlorperazine (COMPAZINE) 10 MG tablet      pyridOXINE (VITAMIN B-6) 100 MG tablet      sucralfate (CARAFATE) 1 GM/10ML suspension       SUMAtriptan (IMITREX) 100 MG tablet        Allergies  Allergen Reactions  . Hydrocodone     "makes me crawl on the floor"  . Penicillins Swelling    Per pt's sister, swelling of throat  . Aspirin Other (See Comments)    Due to ulcers   Follow-up Information    Follow up with [name], MD. Schedule an appointment as soon as possible for a visit on 10/18/2015.   Specialties:  Internal Medicine, Radiology   Why:  Lori  scheduled appt 10/18/15 at 2:45pm   Contact information:   [place] [address] [phone]       Follow up with Volanda Napoleon, MD. Schedule an appointment as soon as possible for a visit in  2 weeks.   Specialty:  Oncology   Why:  message left on scheduling  answering machinen for flu appt.Left call back number and info requested on machine   Contact information:   [place], [address] [place] [address] [phone]       Follow up with [place].   Why:  Office will call you with a follow-up appointment-if you do not hear from them in the next few days-please call them   Contact information:   [address]     [address] [place] [address] [phone]       The results of significant diagnostics from this hospitalization (including imaging, microbiology, ancillary and laboratory) are listed below for reference.    Significant Diagnostic Studies: Ct Head Wo Contrast  10/06/2015  CLINICAL DATA:  Multiple seizures tonight. Known metastatic breast cancer to the brain. History of hypertension, migraines and headaches. EXAM: CT HEAD WITHOUT CONTRAST TECHNIQUE: Contiguous axial images were obtained from the base of the skull through the vertex without intravenous contrast. COMPARISON:  MRI of the brain with and without contrast May 09, 2015 FINDINGS: No intraparenchymal hemorrhage, mass effect, midline shift or acute large vascular territory infarcts. Scattered parenchymal  calcifications, corresponding to metastasis on prior MRI. Moderate ventriculomegaly on the basis of global parenchymal brain volume loss, similar to prior imaging. Patchy supratentorial white matter hypodensities. No abnormal extra-axial fluid collections. Basal cisterns are patent. Ocular globes and orbital contents are unremarkable. LEFT maxillary sinus mucosal thickening with air-fluid level, mild LEFT ethmoid mucosal thickening. The mastoid air cells are well aerated. No skull fracture. Scattered dural calcifications, some which correspond to metastasis on prior MRI. IMPRESSION: No acute intracranial process. Multiple parenchymal and dural calcifications corresponding to known metastasis. Moderate global brain volume loss, advanced for age though similar to prior MRI. Patchy white matter hypodensities may represent chronic small vessel ischemic disease or treatment related changes. Acute on chronic LEFT maxillary sinusitis. Electronically Signed   By: Elon Alas M.D.   On: 10/06/2015 01:05   Mr Jeri Cos CM Contrast  10/06/2015  CLINICAL DATA:  Seizure.  Known brain metastases. EXAM: MRI HEAD WITHOUT AND WITH CONTRAST TECHNIQUE: Multiplanar, multiecho pulse sequences of the brain and surrounding structures were obtained without and with intravenous contrast. CONTRAST:  15 mL MultiHance COMPARISON:  CT head without contrast from the same day. MRI brain 08/09/2015. FINDINGS: Multiple enhancing metastatic lesions of the brain have increased in size. Several of these lesions are hemorrhagic. Acute and chronic blood products are evident within a 14 mm lesion in the right frontal operculum. This lesion previously measured 11 mm. Multiple lesions in the cerebellum have increased in size. The largest right-sided lesions both now measure 2.0 cm in long axis. Confluent lesions along the left side of the superior vermis measure 2.4 x 2.0 cm on axial images. This area previously measured 2.4 x 1.1 cm. On sagittal  images there were 4 discrete lesions to the left midline in the cerebellum previously. These lesions are now all nearly confluent. Associated T2 signal changes about these lesions has increased. There is also confluent periventricular T2 change bilaterally compatible with prior radiation therapy. No subacute infarct is present. There is focal restricted diffusion within a lesion in the medial right occipital lobe on image 23 of series 3. This can be seen in aggressive compact tumors. There is no acute hemorrhage at this lesion. The basal cisterns and ventricles are intact. There is no herniation. No significant  extra-axial fluid collection is present. Flow is present in the major intracranial arteries. The globes and orbits are intact. A fluid level is present in the left maxillary sinus with circumferential mucosal thickening. Disease in the left maxillary sinus has progressed. Left ethmoid disease has progressed as well. IMPRESSION: 1. Progression in size of multiple enhancing metastatic lesions throughout the brain as described. 2. Several of these lesions are hemorrhagic with remote blood products similar to the prior study. 3. Both acute and remote blood products are evident in the right operculum. 4. Increased diffuse T2 signal changes associated with individual lesions and about the ventricles. This represents a progression of disease as well as the sequelae of whole brain radiation. Electronically Signed   By: San Morelle M.D.   On: 10/06/2015 17:54   Dg Chest Port 1 View  10/06/2015  CLINICAL DATA:  Acute onset of seizure.  Initial encounter. EXAM: PORTABLE CHEST 1 VIEW COMPARISON:  Chest radiograph performed 04/05/2014, and CT of the chest performed 09/04/2015 FINDINGS: The lungs are well-aerated. There is mild elevation of the left hemidiaphragm. Pulmonary vascularity is at the upper limits of normal. There is no evidence of focal opacification, pleural effusion or pneumothorax. The  cardiomediastinal silhouette is within normal limits. A left-sided chest port is noted ending about the distal SVC. No acute osseous abnormalities are seen. IMPRESSION: Mild elevation of the left hemidiaphragm.  Lungs otherwise clear. Electronically Signed   By: Garald Balding M.D.   On: 10/06/2015 03:16    Microbiology: Recent Results (from the past 240 hour(s))  Blood culture (routine x 2)     Status: None (Preliminary result)   Collection Time: 10/06/15  2:35 AM  Result Value Ref Range Status   Specimen Description BLOOD LEFT CHEST  Final   Special Requests BOTTLES DRAWN AEROBIC AND ANAEROBIC 10CC  Final   Culture NO GROWTH 3 DAYS  Final   Report Status PENDING  Incomplete  MRSA PCR Screening     Status: None   Collection Time: 10/06/15  4:10 AM  Result Value Ref Range Status   MRSA by PCR NEGATIVE NEGATIVE Final    Comment:        The GeneXpert MRSA Assay (FDA approved for NASAL specimens only), is one component of a comprehensive MRSA colonization surveillance program. It is not intended to diagnose MRSA infection nor to guide or monitor treatment for MRSA infections.   Blood culture (routine x 2)     Status: None (Preliminary result)   Collection Time: 10/06/15  5:01 AM  Result Value Ref Range Status   Specimen Description BLOOD RIGHT ARM  Final   Special Requests IN PEDIATRIC BOTTLE 3CC  Final   Culture NO GROWTH 3 DAYS  Final   Report Status PENDING  Incomplete     Labs: Basic Metabolic Panel:  Recent Labs Lab 10/06/15 0108 10/06/15 0501 10/08/15 0532 10/09/15 0712  NA 130* 138 137 137  K 3.8 3.3* 3.3* 3.4*  CL 95* 101 104 102  CO2 _0 GLUCOSE 528* 202* 157* 246*  BUN _1 CREATININE 0.82 0.67 0.79 0.61  CALCIUM 8.8* 8.4* 8.3* 8.5*  MG  --   --  1.7  --    Liver Function Tests:  Recent Labs Lab 10/06/15 0501 10/08/15 0532  AST 26 32  ALT 16 18  ALKPHOS 71 76  BILITOT 0.7 0.7  PROT 4.9* 5.0*  ALBUMIN 2.2* 2.3*   No results  for input(s):  LIPASE, AMYLASE in the last 168 hours. No results for input(s): AMMONIA in the last 168 hours. CBC:  Recent Labs Lab 10/06/15 0108 10/06/15 0501 10/08/15 0532  WBC 2.6* 2.7* 3.3*  NEUTROABS 2.1  --   --   HGB 11.4* 10.2* 11.6*  HCT 33.2* 30.3* 32.8*  MCV 101.5* 100.7* 100.3*  PLT 243 218 327   Cardiac Enzymes: No results for input(s): CKTOTAL, CKMB, CKMBINDEX, TROPONINI in the last 168 hours. BNP: BNP (last 3 results) No results for input(s): BNP in the last 8760 hours.  ProBNP (last 3 results) No results for input(s): PROBNP in the last 8760 hours.  CBG:  Recent Labs Lab 10/09/15 0653 10/09/15 1159 10/09/15 1637 10/09/15 2223 10/10/15 0657  GLUCAP 242* 349* 228* 233* 232*    Signed:  Hydeia Mcatee [facility] 10/10/2015, 10:14 AM

## 2015-10-09 NOTE — Care Management Note (Signed)
Case Management Note  Patient Details  Name: Mary [name] MRN: [id number] Date of Birth: [birth year]  Subjective/Objective:                    Action/Plan: CM spoke with Hassan Rowan from [facility] this am and she accepted the referral for [name]. CM will continue to follow for any additional needs before pt discharges.   Expected Discharge Date:  10/09/15               Expected Discharge Plan:  [place]  In-House Referral:     Discharge planning Services  CM Consult  Post Acute Care Choice:    Choice offered to:  Patient, Sibling ([place]  )  DME Arranged:    DME Agency:     HH Arranged:  RN, NA HH Agency:   ([place])  Status of Service:  In process, will continue to follow  Medicare Important Message Given:    Date Medicare IM Given:    Medicare IM give by:    Date Additional Medicare IM Given:    Additional Medicare Important Message give by:     If discussed at [place] of Stay Meetings, dates discussed:    Additional Comments:  Pollie Friar, RN 10/09/2015, 9:54 AM

## 2015-10-09 NOTE — Progress Notes (Signed)
[name] she's been doing a little bit better. She seems to be a little bit more alert. Possibly, just have her blood sugars under better control have helped this. I think the real question is how well her blood sugars can be controlled when she is discharged from the hospital.  She is doing some physical therapy.  She is not complaining of any pain. Her appetite seems doing okay.  She is more conversant.  Her labs look pretty good. Her white cell count 3.3. Platelet count 327. Hemoglobin 11.8.  She is on the [place]. Hopefully, these will help with her systemic disease.  I spoke to she and her husband. I told him that the only avenue to try to help her brain metastasis would be radiation. I really know have any great therapy to help area and she is on the [place] and Aromasin. I much or how much CNS penetration this gets.  She had an appointment at the brain metastasis clinic at [facility]. This was today. She will have to reschedule this.  I know that there is some chemotherapy that can get into the brain. However, there is certainly no standard approach outside of radiation. She has already had radiation. I will try to speak with her radiation oncologist and see if there is any potential intervention that they may be able to do.  I started talking to them about the possibility of getting hospice involved. I really believe this is the road that we are going to go down. I think this probably would be the best way to try to help her. I want her to have comfort, respect and dignity.  I have not yet talked Code status with her.  On her physical exam, her vital signs all look stable. She is afebrile. Her blood pressure is 140/99. Her oral exam shows no thrush. Pupils react appropriately. Her lungs are clear. Cardiac exam regular rate and rhythm with no murmurs, rubs or bruits. Abdomen is soft. She has no fluid wave. There is no palpable liver or spleen tip. Extremities shows  decent movement of her arms and legs.  Hopefully, she will be able to be discharged soon. I'm unsure what else can be done for her in the hospital. The hospitalist and done a great job with her. She is on Keppra and Decadron. Maybe the Decadron dose can be decreased a little bit.  I think the real issue with her is going to be blood sugar control. She really needs to have somebody go out to her house to help her manage this. It would not surprise me if her blood sugars got out of control again. I think this is partly why she was admitted.  Again, she is getting fantastic care by the hospitalist up on 76M!!  Pete E.  Ephesians 6:10

## 2015-10-09 NOTE — Progress Notes (Signed)
[facility] PROGRESS NOTE  Mary [name]:[id number] DOB: 1956/06/10 DOA: 10/05/2015 PCP: Mary Cower, MD  HPI 59 y/o female followed by Dr. 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 Tammi [name] not amenable to stereotactic surgery. She was supposed to have been follwed at [facility] breast cancer brain met clinic s/p 5 cycles carboplatin/gemzar/perjeta/herceptin. She presented from home 10/9 with recurring seizures.  Subjective: Today the patient is doing well, sitting up in her chair eating breakfast.   Assessment/Plan: Principal Problem:  Generalized Seizure, new onset: Secondary to metastasized breast cancer to the brain. Neurology consulted and initiated Keppra without complications. UE doppler shows no evidence of DVT. No evidence of recurrent seizures. PT consulted and recommends home health PT. Continue Keppra and monitor.    Active Problems:  Hyperglycemia: 528 on admission, secondary to steroid use. A1c is 12.6. Initiated sliding scale insulin, IVF, and 5 mg QD Lantus on admission without complications. CBGs continue to improve (157 today). Initiated chronic management of hyperglycemia with Lantus 20u QD and Novolog 6u TID today. Patient educated on insulin use. She will need to remain on insulin therapy on discharge.  Metastatic Breast Cancer: MRI of brain indicates progressive metastatic disease. Per oncology recommendation, we will continue Aromasin, Ibrance, and Decadron. Seen by Onc-recommendations are for Rad Onc consult-pending today  Right breast wound: Chronic, open wound. Secondary to breast cancer treatment. Evaluated by [place]- continue dressings per [facility]OC recommendations. Improving, discontinued Levaquin d/t risk of seizures and initiated Doxycycline. Continue to monitor.   Pseudohyponatremia: 130 on admission, secondary to hypoglycemia. Resolved with correction of  hyperglycemia.  Hypokalemia: Magnesium WNL, Potassium decreased to 3.3. Replete and monitor.   Leukopenia: Chronic, secondary to breast cancer treatment. Stable, continue to monitor.  Lactic Acidosis: 4.0 on admission, resolved with IVF.   Hypertension: Chronic. Currently stable. Continue hydralazine PRN orders and monitor.   Code Status: Full  Family Communication: Husband at bedside Disposition Plan: Remain inpatient  DVT Prophylaxis: Lovenox   Consultants:  Neurology   Oncology   Procedures:  UE venous doppler   Antibiotics:  Aztreonam 10/8 > 10/10  Vancomycin 10/8 > 10/10  Levaquin 10/10 > 10/11  Doxycycline 10/11 >     Objective: Filed Vitals:   10/09/15 0900  BP: 129/89  Pulse: 103  Temp: 98 F (36.7 C)  Resp: 18   No intake or output data in the 24 hours ending 10/09/15 1620 Filed Weights   10/06/15 0200 10/06/15 0411  Weight: 73.5 kg (162 lb 0.6 oz) 69.5 kg (153 lb 3.5 oz)    Exam:   General:  Frail-appearing woman, sitting in chair eating breakfast, no acute distress  Cardiovascular: RRR, no m/r/g. No peripheral edema  Respiratory: CTA b/l. No wheeze or crackles  Abdomen: Soft, non-tender, non-distended. +BS  Neuro: A&Ox3, grip strength equal bilaterally. Appropriate movement of UE/LE, slight left sided weakness  Skin: Ulceration on R outer breast (3cm x 5cm x 0.5cm, 85% erythematous granular base, 15% necrotic with pus)   Data Reviewed: Basic Metabolic Panel:  Recent Labs Lab 10/06/15 0108 10/06/15 0501 10/08/15 0532 10/09/15 0712  NA 130* 138 137 137  K 3.8 3.3* 3.3* 3.4*  CL 95* 101 104 102  CO2 _0 GLUCOSE 528* 202* 157* 246*  BUN _1 CREATININE 0.82 0.67 0.79 0.61  CALCIUM 8.8* 8.4* 8.3* 8.5*  MG  --   --  1.7  --  Liver Function Tests:  Recent Labs Lab 10/06/15 0501 10/08/15 0532  AST 26 32  ALT 16 18  ALKPHOS 71 76  BILITOT 0.7 0.7  PROT 4.9* 5.0*  ALBUMIN 2.2* 2.3*   No results for  input(s): LIPASE, AMYLASE in the last 168 hours. No results for input(s): AMMONIA in the last 168 hours. CBC:  Recent Labs Lab 10/06/15 0108 10/06/15 0501 10/08/15 0532  WBC 2.6* 2.7* 3.3*  NEUTROABS 2.1  --   --   HGB 11.4* 10.2* 11.6*  HCT 33.2* 30.3* 32.8*  MCV 101.5* 100.7* 100.3*  PLT 243 218 327   Cardiac Enzymes: No results for input(s): CKTOTAL, CKMB, CKMBINDEX, TROPONINI in the last 168 hours. BNP (last 3 results) No results for input(s): BNP in the last 8760 hours.  ProBNP (last 3 results) No results for input(s): PROBNP in the last 8760 hours.  CBG:  Recent Labs Lab 10/08/15 1145 10/08/15 1640 10/08/15 2205 10/09/15 0653 10/09/15 1159  GLUCAP 318* 296* 216* 242* 349*    Recent Results (from the past 240 hour(s))  Blood culture (routine x 2)     Status: None (Preliminary result)   Collection Time: 10/06/15  2:35 AM  Result Value Ref Range Status   Specimen Description BLOOD LEFT CHEST  Final   Special Requests BOTTLES DRAWN AEROBIC AND ANAEROBIC 10CC  Final   Culture NO GROWTH 3 DAYS  Final   Report Status PENDING  Incomplete  MRSA PCR Screening     Status: None   Collection Time: 10/06/15  4:10 AM  Result Value Ref Range Status   MRSA by PCR NEGATIVE NEGATIVE Final    Comment:        The GeneXpert MRSA Assay (FDA approved for NASAL specimens only), is one component of a comprehensive MRSA colonization surveillance program. It is not intended to diagnose MRSA infection nor to guide or monitor treatment for MRSA infections.   Blood culture (routine x 2)     Status: None (Preliminary result)   Collection Time: 10/06/15  5:01 AM  Result Value Ref Range Status   Specimen Description BLOOD RIGHT ARM  Final   Special Requests IN PEDIATRIC BOTTLE 3CC  Final   Culture NO GROWTH 3 DAYS  Final   Report Status PENDING  Incomplete     Studies: No results found.  Scheduled Meds: . dexamethasone  4 mg Oral 3 times per day  . doxycycline  100 mg  Oral Q12H  . enoxaparin (LOVENOX) injection  40 mg Subcutaneous Q24H  . exemestane  25 mg Oral QPC breakfast  . insulin aspart  0-9 Units Subcutaneous TID WC  . insulin aspart  6 Units Subcutaneous TID WC  . insulin glargine  20 Units Subcutaneous QHS  . levETIRAcetam  500 mg Oral BID  . palbociclib  125 mg Oral Q breakfast  . pantoprazole  40 mg Oral BID  . potassium chloride  40 mEq Oral BID   Continuous Infusions:    Principal Problem:   Seizure (HCC) Active Problems:   Hypertension   Migraine headache   Breast cancer metastasized to multiple sites [facility])   Wound of right breast   Hyperglycemia   Wound infection ([place])   Metastatic cancer to brain [facility])    Oren Binet, MD [facility] If 7PM-7AM, please contact night-coverage at [email or web address], password [facility] 10/09/2015, 4:20 PM  LOS: 3 days

## 2015-10-09 NOTE — Progress Notes (Signed)
  Radiation Oncology         ([phone]) [phone] ________________________________  Name: Mary [name] MRN: [id number]  Date: 10/05/2015  DOB: 09/16/1956  Chart Note:  I reviewed this patient's most recent findings and visited with the family this evening.  She has widespread enlarging recurrent brain mets 7 months s/p whole brain radiotherapy.  At this point, her options are comfort care versus re-irradiation of the whole brain.  Re-irradiation carries higher risk of neurocognitive toxicity with decreased short term memory and concentration.  It could provide her with some relief of symptoms temporarily, delaying the inevitable.  I talked the patient, the husband and family.  The patient would like to pursue re-irradiation.  Following hospital discharge, we will arrange CT simulation as outpatient.  Tentative plan for 1 pm on 10/13.   ________________________________  Sheral Apley. Tammi Klippel, M.D.

## 2015-10-09 NOTE — Progress Notes (Signed)
Mary [name] sister Shauna Hugh requested that CM help to fax a [place] form to [facility] to help her sister obtain additional aide services at home. CM spoke with Audelia Acton at [facility] to find the form on their web site. CM then had Dr Sloan Leiter fill out his portion of form and faxed the information to the fax number on the form. Received transmittal confirmation.

## 2015-10-10 ENCOUNTER — Other Ambulatory Visit: Payer: Self-pay | Admitting: Hematology & Oncology

## 2015-10-10 ENCOUNTER — Ambulatory Visit: Payer: Medicaid Other | Admitting: Radiation Oncology

## 2015-10-10 ENCOUNTER — Ambulatory Visit
Admission: RE | Admit: 2015-10-10 | Discharge: 2015-10-10 | Disposition: A | Discharge status: Home / Self Care | Payer: Medicaid Other | Source: Ambulatory Visit | Attending: Radiation Oncology | Admitting: Radiation Oncology

## 2015-10-10 DIAGNOSIS — C50911 Malignant neoplasm of unspecified site of right female breast: Secondary | ICD-10-CM

## 2015-10-10 DIAGNOSIS — L089 Local infection of the skin and subcutaneous tissue, unspecified: Secondary | ICD-10-CM

## 2015-10-10 DIAGNOSIS — T148 Other injury of unspecified body region: Secondary | ICD-10-CM

## 2015-10-10 DIAGNOSIS — D500 Iron deficiency anemia secondary to blood loss (chronic): Secondary | ICD-10-CM

## 2015-10-10 LAB — GLUCOSE, CAPILLARY: Glucose-Capillary: 232 mg/dL — ABNORMAL HIGH (ref 65–99)

## 2015-10-10 NOTE — Discharge Instructions (Signed)
Follow with Primary MD  [name], MD  and other consultant as instructed your Hospitalist MD  Please get a complete blood count and chemistry panel checked by your Primary MD at your next visit, and again as instructed by your Primary MD.  You had Seizure: Please do not drive, operate heavy machinery, participate in activities at heights or participate in high speed sports until you have seen by Primary MD or a Neurologist and advised to do so again.  Get Medicines reviewed and adjusted. Please take all your medications with you for your next visit with your Primary MD  Please request your Primary MD to go over all hospital tests and procedure/radiological results at the follow up, please ask your Primary MD to get all Hospital records sent to his/her office.  If you experience worsening of your admission symptoms, develop shortness of breath, life threatening emergency, suicidal or homicidal thoughts you must seek medical attention immediately by calling 911 or calling your MD immediately  if symptoms less severe.  You must read complete instructions/literature along with all the possible adverse reactions/side effects for all the Medicines you take and that have been prescribed to you. Take any new Medicines after you have completely understood and accpet all the possible adverse reactions/side effects.   Do not drive when taking Pain medications.   Do not take more than prescribed Pain, Sleep and Anxiety Medications  Special Instructions: If you have smoked or chewed Tobacco  in the last 2 yrs please stop smoking, stop any regular Alcohol  and or any Recreational drug use.  Wear Seat belts while driving.  Please note  You were cared for by a hospitalist during your hospital stay. Once you are discharged, your primary care physician will handle any further medical issues. Please note that NO REFILLS for any discharge medications will be authorized once you are discharged, as it is  imperative that you return to your primary care physician (or establish a relationship with a primary care physician if you do not have one) for your aftercare needs so that they can reassess your need for medications and monitor your lab values.

## 2015-10-10 NOTE — Progress Notes (Signed)
Mary [name] is about the same. She is fairly alert.  I totally appreciate Dr. Johny Shears input yesterday. I totally understand his recommendation and his concerns. I certainly have no problems with her getting re--radiation for her brain.  I spoke to her husband in private. I told him that her prognosis is going to be quite admitted. The radiation will not eradicate the brain metastases. At some point, even if the radiation works, the tube was will grow.  She is not a candidate for any systemic chemotherapy. I have her on Aromasin and Ibrance. Her tumor is ER positive. Even though it is HER-2 positive, I really do not see a role for any Herceptin or Perjeta in this case.  I think a big issue for her is the fact that her blood sugars are poorly controlled. I'm not sure I will they will be controlled at home. I told her husband that because of her high blood sugars, this probably is allowing the cancer to grow more quickly.  I talked to her husband about getting hospice. This was difficult. It is obvious that he wants to try to help her. I think he does understand that she has gone through quite a lot of therapy and that the fact that the cancer is progressing in her brain is what will ultimately take her.  I have not had any formal discussions of CODE STATUS with her. Her husband would not want to see her on life-support. I took her with this. However, I will have to talk to the patient regarding this.  Hopefully, she will be going home today. She actually is fairly stable.  She definitely needs some type of home health care and home physical therapy. Home healthcare will be essential to try to help her manage her blood sugars.  On her physical exam, her temperature is 97.9. Pulse 65. Blood pressure 166/97. Her head and neck exam shows no thrush. She has no adenopathy in the neck. Pupils react appropriately. Lungs are clear bilaterally. Cardiac exam regular rate and rhythm with no murmurs, rubs or  bruits. Abdomen is soft. She has good bowel sounds. There is no fluid wave. There is no palpable liver or spleen tip. Extremities shows no clubbing, cyanosis or edema. She seems to have equal strength bilaterally.  Again, I think we are in a situation where we need to begin focusing on comfort. I think radiation to the brain would be a good idea to try to minimize symptoms and seizures. Again, I thank [name] for his expert input.  I will like to see her back in a couple weeks.  She will stay on the Ibrance and Aromasin.  She is not complaining of any oral or mouth issues. However, we will hold on any therapy for her bones.  I very much appreciate all the great care that she is getting from the staff on 69M!!  [place] 3:22-23

## 2015-10-10 NOTE — Progress Notes (Signed)
Inpatient Diabetes Program Recommendations  AACE/ADA: New Consensus Statement on Inpatient Glycemic Control (2015)  Target Ranges:  Prepandial:   less than 140 mg/dL      Peak postprandial:   less than 180 mg/dL (1-2 hours)      Critically ill patients:  140 - 180 mg/dL   Results for BETTEJANE, LEAVENS (MRN [id number]) as of 10/10/2015 09:24  Ref. Range 10/09/2015 06:53 10/09/2015 11:59 10/09/2015 16:37 10/09/2015 22:23 10/10/2015 06:57  Glucose-Capillary Latest Ref Range: 65-99 mg/dL 242 (H) 349 (H) 228 (H) 233 (H) 232 (H)   Review of Glycemic Control  Diabetes history: None Current orders for Inpatient glycemic control: Lantus 20 units, Novolog Sensitive TID, Novolog 6 units TID meal coverage  Inpatient Diabetes Program Recommendations:  Insulin - Basal: Fasting glucose is 232 mg/dl this am. Patient also receiving Decadron 4mg  TID. Please consider increasing basal insulin to Lantus 28 units Q24hrs. HgbA1C: A1c 12.6%. Will need insulin at time of discharge.  Thanks,  Tama Headings RN, MSN, [facility] Inpatient Diabetes Coordinator Team Pager [phone] (8a-5p)

## 2015-10-11 LAB — CULTURE, BLOOD (ROUTINE X 2)
Culture: NO GROWTH
Culture: NO GROWTH

## 2015-10-14 ENCOUNTER — Ambulatory Visit: Payer: Medicaid Other | Admitting: Radiation Oncology

## 2015-10-14 ENCOUNTER — Ambulatory Visit
Admission: RE | Admit: 2015-10-14 | Discharge: 2015-10-14 | Disposition: A | Discharge status: Home / Self Care | Payer: Medicaid Other | Source: Ambulatory Visit | Attending: Radiation Oncology | Admitting: Radiation Oncology

## 2015-10-14 DIAGNOSIS — C50411 Malignant neoplasm of upper-outer quadrant of right female breast: Secondary | ICD-10-CM | POA: Diagnosis not present

## 2015-10-14 DIAGNOSIS — C7931 Secondary malignant neoplasm of brain: Secondary | ICD-10-CM | POA: Diagnosis present

## 2015-10-14 NOTE — Progress Notes (Signed)
  Radiation Oncology         [phone] ________________________________  Name: Mary [name] MRN: [id number]  Date: 10/14/2015  DOB: 1956-03-22  SIMULATION AND TREATMENT PLANNING NOTE    ICD-9-CM ICD-10-CM   1. over 17 brain metastases, several showing hemorrhage 198.3 C79.31     DIAGNOSIS:  58 yo woman with recurrent diffuse symptomatic brain mets from metastatic breast cancer  NARRATIVE:  The patient was brought to the [place].  Identity was confirmed.  All relevant records and images related to the planned course of therapy were reviewed.  The patient freely provided informed written consent to proceed with treatment after reviewing the details related to the planned course of therapy. The consent form was witnessed and verified by the simulation staff.  Then, the patient was set-up in a stable reproducible  supine position for radiation therapy.  CT images were obtained.  Surface markings were placed.  The CT images were loaded into the planning software.  Then the target and avoidance structures were contoured.  Treatment planning then occurred.  The radiation prescription was entered and confirmed.  Then, I designed and supervised the construction of a total of 3 medically necessary complex treatment devices, including a custom made thermoplastic mask used for immobilization and two complex multileaf collimators to cover the entire intracranial contents, while shielding the eyes and face.  Each [facility] is independently created to account for beam divergence.  The right and left lateral fields will be treated with 6 MV X-rays.  I have requested : Isodose Plan.    SPECIAL TREATMENT PROCEDURE:  The planned course of therapy using radiation constitutes a special treatment procedure. Special care is required in the management of this patient for the following reasons. This treatment constitutes a Special Treatment Procedure for the following reason: [ Retreatment in a  previously radiated area requiring careful monitoring of increased risk of toxicity due to overlap of previous treatment..  The special nature of the planned course of radiotherapy will require increased physician supervision and oversight to ensure patient's safety with optimal treatment outcomes.  PLAN:  The whole brain will be treated to 30 Gy in 12 fractions.  This document serves as a record of services personally performed by Tyler Pita, MD. It was created on his behalf by Darcus Austin, a trained medical scribe. The creation of this record is based on the scribe's personal observations and the provider's statements to them. This document has been checked and approved by the attending provider.    ________________________________  Sheral Apley. Tammi Klippel, M.D.

## 2015-10-15 DIAGNOSIS — C7931 Secondary malignant neoplasm of brain: Secondary | ICD-10-CM | POA: Diagnosis not present

## 2015-10-16 ENCOUNTER — Ambulatory Visit
Admission: RE | Admit: 2015-10-16 | Discharge: 2015-10-16 | Disposition: A | Discharge status: Home / Self Care | Payer: Medicaid Other | Source: Ambulatory Visit | Attending: Radiation Oncology | Admitting: Radiation Oncology

## 2015-10-16 DIAGNOSIS — C7931 Secondary malignant neoplasm of brain: Secondary | ICD-10-CM | POA: Diagnosis not present

## 2015-10-17 ENCOUNTER — Ambulatory Visit
Admission: RE | Admit: 2015-10-17 | Discharge: 2015-10-17 | Disposition: A | Discharge status: Home / Self Care | Payer: Medicaid Other | Source: Ambulatory Visit | Attending: Radiation Oncology | Admitting: Radiation Oncology

## 2015-10-17 DIAGNOSIS — C7931 Secondary malignant neoplasm of brain: Secondary | ICD-10-CM | POA: Diagnosis not present

## 2015-10-18 ENCOUNTER — Ambulatory Visit: Payer: Self-pay | Admitting: Internal Medicine

## 2015-10-18 ENCOUNTER — Ambulatory Visit
Admission: RE | Admit: 2015-10-18 | Discharge: 2015-10-18 | Disposition: A | Discharge status: Home / Self Care | Payer: Medicaid Other | Source: Ambulatory Visit | Attending: Radiation Oncology | Admitting: Radiation Oncology

## 2015-10-18 VITALS — BP 128/95 | HR 91 | Resp 16

## 2015-10-18 DIAGNOSIS — C7931 Secondary malignant neoplasm of brain: Secondary | ICD-10-CM

## 2015-10-18 DIAGNOSIS — Z0289 Encounter for other administrative examinations: Secondary | ICD-10-CM

## 2015-10-18 MED ORDER — BIAFINE EX EMUL
Freq: Every day | CUTANEOUS | Status: DC
  Administered 2015-10-18: 13:00:00 via TOPICAL

## 2015-10-18 MED ORDER — NYSTATIN 100000 UNIT/ML MT SUSP: 5.0000 mL | Freq: Four times a day (QID) | OROMUCOSAL | Status: DC

## 2015-10-18 NOTE — Progress Notes (Signed)
Department of Radiation Oncology  Phone:  [phone] Fax:        [phone]  Weekly Treatment Note    Name: Mary [name] Date: 10/18/2015 MRN: [id number] DOB: 02/23/1956   Current dose: 7.5 Gy  Current fraction: 3   MEDICATIONS: Current Outpatient Prescriptions  Medication Sig Dispense Refill  . ACCU-CHEK AVIVA PLUS test strip USE TO CHECK BLOOD GLUCOSE 4 TIMES DAILY AFTER MEALS AND AT BEDTIME  0  . ACCU-CHEK SOFTCLIX LANCETS lancets USE TO CHECK BLOOD GLUCOSE 4 TIMES DAILY  0  . B-D ULTRAFINE III SHORT PEN 31G X 8 MM MISC USE WITH LANTUS AND NOVOLOG  0  . Blood Glucose Monitoring Suppl (ACCU-CHEK AVIVA PLUS) W/DEVICE KIT USE TO CHECK BLOOD GLUCOSE 4 TIMES DAILY  0  . dexamethasone (DECADRON) 4 MG tablet Take 1 tablet (4 mg total) by mouth 3 (three) times daily. 90 tablet 0  . exemestane (AROMASIN) 25 MG tablet Take 1 tablet (25 mg total) by mouth daily after breakfast. 30 tablet 6  . gabapentin (NEURONTIN) 300 MG capsule Take 1 capsule (300 mg total) by mouth 3 (three) times daily. 90 capsule 4  . glucose monitoring kit (FREESTYLE) monitoring kit 1 each by Does not apply route 4 (four) times daily - after meals and at bedtime. 1 month Diabetic Testing Supplies for QAC-QHS accuchecks. 1 each 1  . insulin aspart (NOVOLOG FLEXPEN) 100 UNIT/ML FlexPen 0-9 Units, Subcutaneous, 3 times daily with meals CBG < 70: call MD CBG 70 - 120: 0 units CBG 121 - 150: 1 unit CBG 151 - 200: 2 units CBG 201 - 250: 3 units CBG 251 - 300: 5 units CBG 301 - 350: 7 units CBG 351 - 400: 9 units CBG > 400: call MD  Okay to substitute with different band at equivalent dosing 15 mL 0  . Insulin Glargine (LANTUS SOLOSTAR) 100 UNIT/ML Solostar Pen Inject 20 Units into the skin daily at 10 pm. Okay to substitute with different band at equivalent dosing 15 mL 0  . Insulin Pen Needle 32G X 8 MM MISC Please provide 2 month supply. Okay to substitute with different brand 100 each 0  .  levETIRAcetam (KEPPRA) 500 MG tablet Take 1 tablet (500 mg total) by mouth 2 (two) times daily. 60 tablet 0  . ondansetron (ZOFRAN) 8 MG tablet Take 1 tablet (8 mg total) by mouth 2 (two) times daily. Start the day after chemo for 3 days. Then take as needed for nausea or vomiting. 20 tablet 0  . oxyCODONE (OXY IR/ROXICODONE) 5 MG immediate release tablet Take 1-3 tablets (5-15 mg total) by mouth every 6 (six) hours as needed for severe pain. Take 1-2 if needed for pain due to cancer. 25 tablet 0  . palbociclib (IBRANCE) 125 MG capsule Take whole with food every day for 21 days and then off for 7 days 21 capsule 4  . pantoprazole (PROTONIX) 40 MG tablet Take 1 tablet (40 mg total) by mouth 2 (two) times daily. 60 tablet 0  . potassium chloride SA (K-DUR,KLOR-CON) 20 MEQ tablet Take 1 tablet (20 mEq total) by mouth daily. 30 tablet 0  . doxycycline (VIBRA-TABS) 100 MG tablet Take 1 tablet (100 mg total) by mouth every 12 (twelve) hours. 3 more days from 10/09/15 (Patient not taking: Reported on 10/18/2015) 6 tablet 0  . fluconazole (DIFLUCAN) 100 MG tablet Take 100 mg by mouth daily.  3  . nystatin (MYCOSTATIN) 100000 UNIT/ML suspension Take 5 mLs (500,000  Units total) by mouth 4 (four) times daily. 240 mL 0   No current facility-administered medications for this encounter.     ALLERGIES: Hydrocodone; Penicillins; and Aspirin   LABORATORY DATA:  Lab Results  Component Value Date   WBC 3.3* 10/08/2015   HGB 11.6* 10/08/2015   HCT 32.8* 10/08/2015   MCV 100.3* 10/08/2015   PLT 327 10/08/2015   Lab Results  Component Value Date   NA 137 10/09/2015   K 3.4* 10/09/2015   CL 102 10/09/2015   CO2 25 10/09/2015   Lab Results  Component Value Date   ALT 18 10/08/2015   AST 32 10/08/2015   ALKPHOS 76 10/08/2015   BILITOT 0.7 10/08/2015     NARRATIVE: Mary [name] was seen today for weekly treatment management. The chart was checked and the patient's films were reviewed.  BP  elevated. Denies pain. Patient and husband both unsure of what medications she is taking. Encourage them to bring all medications to next appointment for clarification. Patient believes she is taking decadron tid. White coating noted on patient's tongue. Patient able to ambulate only a few feet independently. Reports persistent headache, these have not worsened. Reports diplopia. Denies ringing in the ears. Reports nausea and vomiting after meals.   PHYSICAL EXAMINATION: blood pressure is 128/95 and her pulse is 91. Her respiration is 16 and oxygen saturation is 100%.      Thrush noted involving the tongue.  ASSESSMENT: The patient is doing satisfactorily with treatment.  PLAN: We will continue with the patient's radiation treatment as planned. I have written the patient a prescription for nystatin to alleviate thrush.  This document serves as a record of services personally performed by Kyung Rudd, MD. It was created on his behalf by Arlyce Harman, a trained medical scribe. The creation of this record is based on the scribe's personal observations and the provider's statements to them. This document has been checked and approved by the attending provider. ------------------------------------------------  Jodelle Gross, MD, PhD

## 2015-10-18 NOTE — Addendum Note (Signed)
Encounter addended by: Heywood Footman, RN on: 10/18/2015 12:31 PM<BR>     Documentation filed: [employer]x [employer], Inpatient MAR, Orders

## 2015-10-18 NOTE — Addendum Note (Signed)
Encounter addended by: Heywood Footman, RN on: 10/18/2015 12:39 PM<BR>     Documentation filed: Chief Complaint Section, Medications, Notes Section

## 2015-10-18 NOTE — Progress Notes (Addendum)
BP elevated. Denies pain. Patient and husband both unsure of what medications the patient is taking. Encouraged them to bring all medications to next appointment for clarification. Both verbalized understanding.  Patient believes she is taking decadron tid. White coating noted on patient's tongue. Patient able to ambulate only a few feet independently. Reports persistent headache. Reports diplopia. Denies ringing in the ears. Reports nausea and vomiting after meals. Patient has received whole brain radiotherapy once before. Refreshed patient and husband's memory reference potential side effects and management such as, fatigue, skin changes, and headaches. Provided patient with tube of Biafine and directed upon use. Both verbalized understanding.   BP 128/95 mmHg  Pulse 91  Resp 16  Wt   SpO2 100% Wt Readings from Last 3 Encounters:  10/06/15 153 lb 3.5 oz (69.5 kg)  08/28/15 162 lb (73.483 kg)  08/15/15 164 lb 8 oz (74.617 kg)

## 2015-10-21 ENCOUNTER — Telehealth: Payer: Self-pay | Admitting: Emergency Medicine

## 2015-10-21 ENCOUNTER — Ambulatory Visit
Admission: RE | Admit: 2015-10-21 | Discharge: 2015-10-21 | Disposition: A | Discharge status: Home / Self Care | Payer: Medicaid Other | Source: Ambulatory Visit | Attending: Radiation Oncology | Admitting: Radiation Oncology

## 2015-10-21 DIAGNOSIS — C7931 Secondary malignant neoplasm of brain: Secondary | ICD-10-CM | POA: Diagnosis not present

## 2015-10-21 NOTE — Progress Notes (Signed)
Received patient and her husband in the clinic following radiation treatment. Provided patient's husband with a work Quarry manager and calendar of treatment appointments. Patient's husband brought in the following patient medications: zofran, dexamethasone, neurontin, keppra and klor con. Updated patient's medication list in EPIC.  Wrote on the caps of each medication bottle what their use are and mapped out with husband a daily regimen. Stressed the importance of each medication. In a separate bag labeled chemo the husband had Ibrance and Aromasin. He questions if his wife should be taking these. Encourage husband to reach out to Dr. Casper Harrison office for direction. This RN will CC Dr. Margurite Auerbach this note, as well. Flat affect and aloof demeanor of husband is concerning. Will reach out to social work for assistance.

## 2015-10-21 NOTE — Telephone Encounter (Signed)
Patient's sister called with questions regarding her oral chemo pills. States she has 2 on her med list and is asking which one she needs to take. I instructed Mary [name] that Dhanya is to take both medications; the Aromasin and the [place]. Mary [name] verbalized understanding.

## 2015-10-22 ENCOUNTER — Ambulatory Visit
Admission: RE | Admit: 2015-10-22 | Discharge: 2015-10-22 | Disposition: A | Discharge status: Home / Self Care | Payer: Medicaid Other | Source: Ambulatory Visit | Attending: Radiation Oncology | Admitting: Radiation Oncology

## 2015-10-22 DIAGNOSIS — C7931 Secondary malignant neoplasm of brain: Secondary | ICD-10-CM | POA: Diagnosis not present

## 2015-10-23 ENCOUNTER — Ambulatory Visit: Payer: Medicaid Other | Admitting: Diagnostic Neuroimaging

## 2015-10-23 ENCOUNTER — Ambulatory Visit
Admission: RE | Admit: 2015-10-23 | Discharge: 2015-10-23 | Disposition: A | Discharge status: Home / Self Care | Payer: Medicaid Other | Source: Ambulatory Visit | Attending: Radiation Oncology | Admitting: Radiation Oncology

## 2015-10-23 DIAGNOSIS — C7931 Secondary malignant neoplasm of brain: Secondary | ICD-10-CM | POA: Diagnosis not present

## 2015-10-24 ENCOUNTER — Ambulatory Visit
Admission: RE | Admit: 2015-10-24 | Discharge: 2015-10-24 | Disposition: A | Discharge status: Home / Self Care | Payer: Medicaid Other | Source: Ambulatory Visit | Attending: Radiation Oncology | Admitting: Radiation Oncology

## 2015-10-24 DIAGNOSIS — C7931 Secondary malignant neoplasm of brain: Secondary | ICD-10-CM | POA: Diagnosis not present

## 2015-10-25 ENCOUNTER — Ambulatory Visit
Admission: RE | Admit: 2015-10-25 | Discharge: 2015-10-25 | Disposition: A | Discharge status: Home / Self Care | Payer: Medicaid Other | Source: Ambulatory Visit | Attending: Radiation Oncology | Admitting: Radiation Oncology

## 2015-10-25 ENCOUNTER — Encounter: Payer: Self-pay | Admitting: Radiation Oncology

## 2015-10-25 ENCOUNTER — Encounter: Payer: Self-pay | Admitting: *Deleted

## 2015-10-25 VITALS — BP 113/76 | HR 107 | Resp 18

## 2015-10-25 DIAGNOSIS — C7931 Secondary malignant neoplasm of brain: Secondary | ICD-10-CM

## 2015-10-25 NOTE — Progress Notes (Signed)
Mary [name]  Clinical Social [name] was referred by nurse for assessment of psychosocial needs due to various needs and questionable caregiver support.  Clinical Social Worker met with patient and husband at [facility] to offer support and assess for needs.  Husband works as a Retail buyer at [employer] and they have been struggling to make ends meet. Pt had her ss disability denied and CSW provided her with referral form to [facility] for more help with this process. CSW also referred pt to Otilio Carpen at [facility] to see about grant funds. Pt had food stamps, but they were terminated and will need letter stating she is too sick to [name]. Husband running late for [name] today, but is eager for more help. CSW provided pt and husband with Brain Tumor resource book, Brain Tumor Support group, Caregiver Support group and Caregiver handouts. Sister phoned CSW and reports husband is really struggling with caregiver duties. They have [facility] coming on Tuesday to do an assessment. Sister is caregiver for their mother who is also in wheelchair, this she cannot attend appointments very easily. CSW educated sister that we could provide assistance in pushing wheelchairs in order to all meet and get services for family. Sister eager to help and will review book with pt as she gives shot to patient every evening. Pt, husband and sister very appreciative of support. CSW to follow.  Clinical Social [name] interventions: Environmental consultant Supportive listening  Loren Racer, [place] Worker [place]  [place] Phone: [phone] Fax: [phone]

## 2015-10-25 NOTE — Progress Notes (Signed)
  Radiation Oncology         [phone]   Name: Mary [name] MRN: [id number]   Date: 10/25/2015  DOB: 05/15/1956     Weekly Radiation Therapy Management    ICD-9-CM ICD-10-CM   1. over 75 brain metastases, several showing hemorrhage 198.3 C79.31     Current Dose: 20 Gy  Planned Dose:  30 Gy  Narrative The patient presents for routine under treatment assessment.  Vitals stable. Patient unable to stand for weight. Reports daily headaches 6 on a scale of 0-10. Taking decadron 4 mg bid. Thrush noted. Patient has been prescribed nystatin. Reports diplopia. Reports nausea but, denies emesis. Reports dizziness. Reports fatigue. Patient reports shortness of breath.  The patient is without complaint. Set-up films were reviewed. The chart was checked.  Physical Findings  blood pressure is 113/76 and her pulse is 107. Her respiration is 18 and oxygen saturation is 100%. . Weight essentially stable.  No significant changes.  Impression The patient is tolerating radiation.  Plan Continue treatment as planned. Patient will be finishing treatment on 11/3.       Sheral Apley Tammi Klippel, M.D.  This document serves as a record of services personally performed by Tyler Pita, MD. It was created on his behalf by Lendon Collar, a trained medical scribe. The creation of this record is based on the scribe's personal observations and the provider's statements to them. This document has been checked and approved by the attending provider.

## 2015-10-25 NOTE — Progress Notes (Signed)
Vitals stable. Patient unable to stand for weight. Reports daily headaches 6 on a scale of 0-10. Taking decadron 4 mg bid. Thrush noted. Patient has been prescribed nystatin. Reports diplopia. Reports nausea but, denies emesis. Reports dizziness. Reports fatigue.   BP 113/76 mmHg  Pulse 107  Resp 18  Wt   SpO2 100% Wt Readings from Last 3 Encounters:  10/06/15 153 lb 3.5 oz (69.5 kg)  08/28/15 162 lb (73.483 kg)  08/15/15 164 lb 8 oz (74.617 kg)

## 2015-10-27 ENCOUNTER — Ambulatory Visit: Admission: RE | Admit: 2015-10-27 | Payer: Medicaid Other | Source: Ambulatory Visit

## 2015-10-28 ENCOUNTER — Ambulatory Visit: Payer: Medicaid Other

## 2015-10-28 ENCOUNTER — Other Ambulatory Visit: Payer: Medicaid Other

## 2015-10-28 ENCOUNTER — Encounter: Payer: Self-pay | Admitting: *Deleted

## 2015-10-28 ENCOUNTER — Ambulatory Visit
Admission: RE | Admit: 2015-10-28 | Discharge: 2015-10-28 | Disposition: A | Discharge status: Home / Self Care | Payer: Medicaid Other | Source: Ambulatory Visit | Attending: Radiation Oncology | Admitting: Radiation Oncology

## 2015-10-28 ENCOUNTER — Ambulatory Visit: Payer: Self-pay | Admitting: Family

## 2015-10-28 DIAGNOSIS — C7931 Secondary malignant neoplasm of brain: Secondary | ICD-10-CM | POA: Diagnosis not present

## 2015-10-29 ENCOUNTER — Ambulatory Visit: Payer: Medicaid Other | Admitting: Diagnostic Neuroimaging

## 2015-10-29 ENCOUNTER — Ambulatory Visit
Admission: RE | Admit: 2015-10-29 | Discharge: 2015-10-29 | Disposition: A | Discharge status: Home / Self Care | Payer: Medicaid Other | Source: Ambulatory Visit | Attending: Radiation Oncology | Admitting: Radiation Oncology

## 2015-10-29 DIAGNOSIS — C7931 Secondary malignant neoplasm of brain: Secondary | ICD-10-CM | POA: Diagnosis not present

## 2015-10-30 ENCOUNTER — Encounter: Payer: Self-pay | Admitting: Diagnostic Neuroimaging

## 2015-10-30 ENCOUNTER — Ambulatory Visit (INDEPENDENT_AMBULATORY_CARE_PROVIDER_SITE_OTHER): Payer: Medicaid Other | Admitting: Diagnostic Neuroimaging

## 2015-10-30 ENCOUNTER — Ambulatory Visit
Admission: RE | Admit: 2015-10-30 | Discharge: 2015-10-30 | Disposition: A | Discharge status: Home / Self Care | Payer: Medicaid Other | Source: Ambulatory Visit | Attending: Radiation Oncology | Admitting: Radiation Oncology

## 2015-10-30 VITALS — BP 121/88 | HR 112 | Ht 62.0 in

## 2015-10-30 DIAGNOSIS — G40109 Localization-related (focal) (partial) symptomatic epilepsy and epileptic syndromes with simple partial seizures, not intractable, without status epilepticus: Secondary | ICD-10-CM | POA: Diagnosis not present

## 2015-10-30 DIAGNOSIS — C7931 Secondary malignant neoplasm of brain: Secondary | ICD-10-CM

## 2015-10-30 MED ORDER — LEVETIRACETAM 500 MG PO TABS: 500.0000 mg | ORAL_TABLET | Freq: Two times a day (BID) | ORAL | Status: DC

## 2015-10-30 NOTE — Progress Notes (Signed)
 [facility]  PATIENT: Mary [name] DOB: 12/13/1956  REFERRING CLINICIAN: Ghimire HISTORY FROM: patient and husband  REASON FOR VISIT: new consult    HISTORICAL  CHIEF COMPLAINT:  Chief Complaint  Patient presents with  . Seizures    rm 6, New Patient, husband- James    HISTORY OF PRESENT ILLNESS:   59-year-old right-handed female here for evaluation of seizure disorder. 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 Patient also status post chemotherapy. Patient was at home on 10/06/15, had 2 seizures at home. She had one or 2 more seizures on the way to the hospital, after husband called 911. Imaging studies demonstrated increasing progression of metastatic disease to the brain. Patient was treated with levetiracetam and patient had no further seizures.  Since discharge patient is stable. No further seizures. Tolerating medication.   REVIEW OF SYSTEMS: Full 14 system review of systems performed and notable only for headache weakness runny nose skin sensitivity chest pain swelling in legs chills double vision.   ALLERGIES: Allergies  Allergen Reactions  . Hydrocodone     "makes me crawl on the floor"  . Penicillins Swelling    Per pt's sister, swelling of throat  . Aspirin Other (See Comments)    Due to ulcers    HOME MEDICATIONS: Outpatient Prescriptions Prior to Visit  Medication Sig Dispense Refill  . ACCU-CHEK AVIVA PLUS test strip USE TO CHECK BLOOD GLUCOSE 4 TIMES DAILY AFTER MEALS AND AT BEDTIME  0  . ACCU-CHEK SOFTCLIX LANCETS lancets USE TO CHECK BLOOD GLUCOSE 4 TIMES DAILY  0  . B-D ULTRAFINE III SHORT PEN 31G X 8 MM MISC USE WITH LANTUS AND NOVOLOG  0  . Blood Glucose Monitoring Suppl (ACCU-CHEK AVIVA PLUS) W/DEVICE KIT USE TO CHECK BLOOD GLUCOSE 4 TIMES DAILY  0  . dexamethasone (DECADRON) 4 MG tablet Take 1 tablet (4 mg total) by mouth 3  (three) times daily. (Patient taking differently: Take 4 mg by mouth 2 (two) times daily. ) 90 tablet 0  . doxycycline (VIBRA-TABS) 100 MG tablet TAKE 1 TABLET BY MOUTH EVERY 12 HOURS *3 MORE DAYS FROM 10/09/15*  0  . exemestane (AROMASIN) 25 MG tablet Take 1 tablet (25 mg total) by mouth daily after breakfast. 30 tablet 6  . gabapentin (NEURONTIN) 300 MG capsule Take 1 capsule (300 mg total) by mouth 3 (three) times daily. 90 capsule 4  . glucose monitoring kit (FREESTYLE) monitoring kit 1 each by Does not apply route 4 (four) times daily - after meals and at bedtime. 1 month Diabetic Testing Supplies for QAC-QHS accuchecks. 1 each 1  . insulin aspart (NOVOLOG FLEXPEN) 100 UNIT/ML FlexPen 0-9 Units, Subcutaneous, 3 times daily with meals CBG < 70: call MD CBG 70 - 120: 0 units CBG 121 - 150: 1 unit CBG 151 - 200: 2 units CBG 201 - 250: 3 units CBG 251 - 300: 5 units CBG 301 - 350: 7 units CBG 351 - 400: 9 units CBG > 400: call MD  Okay to substitute with different band at equivalent dosing 15 mL 0  . Insulin Glargine (LANTUS SOLOSTAR) 100 UNIT/ML Solostar Pen Inject 20 Units into the skin daily at 10 pm. Okay to substitute with different band at equivalent dosing 15 mL 0  . Insulin Pen Needle 32G X 8 MM MISC Please provide 2 month supply. Okay to substitute with different brand 100 each 0  .   nystatin (MYCOSTATIN) 100000 UNIT/ML suspension   0  . ondansetron (ZOFRAN) 8 MG tablet Take 1 tablet (8 mg total) by mouth 2 (two) times daily. Start the day after chemo for 3 days. Then take as needed for nausea or vomiting. 20 tablet 0  . oxyCODONE (OXY IR/ROXICODONE) 5 MG immediate release tablet Take 1-3 tablets (5-15 mg total) by mouth every 6 (six) hours as needed for severe pain. Take 1-2 if needed for pain due to cancer. 25 tablet 0  . palbociclib (IBRANCE) 125 MG capsule Take whole with food every day for 21 days and then off for 7 days 21 capsule 4  . pantoprazole (PROTONIX) 40 MG tablet Take 1  tablet (40 mg total) by mouth 2 (two) times daily. 60 tablet 0  . potassium chloride SA (K-DUR,KLOR-CON) 20 MEQ tablet Take 1 tablet (20 mEq total) by mouth daily. 30 tablet 0  . levETIRAcetam (KEPPRA) 500 MG tablet Take 1 tablet (500 mg total) by mouth 2 (two) times daily. 60 tablet 0   No facility-administered medications prior to visit.    PAST MEDICAL HISTORY: Past Medical History  Diagnosis Date  . Arthritis   . Depression   . Fainting   . Headache   . Hypertension   . Migraines   . History of blood transfusion   . Gastric ulcer   . Breast cancer metastasized to multiple sites (HCC) 04/12/2014    10/30/15 radiation, chemotherapy    PAST SURGICAL HISTORY: Past Surgical History  Procedure Laterality Date  . Abdominal hysterectomy  15 years go    partial    FAMILY HISTORY: Family History  Problem Relation Age of Onset  . Arthritis Other   . Hypertension Mother   . Hypertension Other   . Heart disease Other   . Stroke Father   . Cancer Sister     leukemia    SOCIAL HISTORY:  Social History   Social History  . Marital Status: Married    Spouse Name: N/A  . Number of Children: 0  . Years of Education: N/A   Occupational History  .      no work for 4 to 5 years   Social History Main Topics  . Smoking status: Never Smoker   . Smokeless tobacco: Never Used     Comment: never used tobacco  . Alcohol Use: No  . Drug Use: No  . Sexual Activity: Not Currently   Other Topics Concern  . Not on file   Social History Narrative     PHYSICAL EXAM  GENERAL EXAM/CONSTITUTIONAL: Vitals:  Filed Vitals:   10/30/15 1005  BP: 121/88  Pulse: 112  Height: 5' 2" (1.575 m)     There is no weight on file to calculate BMI.  Vision Screening Comments: 10/30/15 unable to stand for vision screening  Patient is in no distress; well developed, nourished and groomed; neck is supple  CARDIOVASCULAR:  Examination of carotid arteries is normal; no carotid  bruits  Regular rate and rhythm, no murmurs  Examination of peripheral vascular system by observation and palpation is normal  EYES:  Ophthalmoscopic exam of optic discs and posterior segments is normal; no papilledema or hemorrhages  MUSCULOSKELETAL:  Gait, strength, tone, movements noted in Neurologic exam below  NEUROLOGIC: MENTAL STATUS:  No flowsheet data found.  awake, alert, oriented to person, place; not time: "Sep 30, 2015"  recent and remote memory intact  normal attention and concentration  DECR FLUENCY; comprehension intact, naming intact,     fund of knowledge appropriate  CRANIAL NERVE:   2nd - no papilledema on fundoscopic exam  2nd, 3rd, 4th, 6th - pupils equal and reactive to light, visual fields full to confrontation, extraocular muscles intact, no nystagmus  5th - facial sensation symmetric  7th - facial strength symmetric  8th - hearing intact  9th - palate elevates symmetrically, uvula midline  11th - shoulder shrug symmetric  12th - tongue protrusion midline  MODERATE DYSARTHRIA  MOTOR:   normal bulk and tone, DIFFUSE 4/5 STRENGTH  SENSORY:   normal and symmetric to light touch, temperature, vibration  COORDINATION:   finger-nose-finger, fine finger movements normal  REFLEXES:   deep tendon reflexes TRACE and symmetric  GAIT/STATION:   IN WHEELCHAIR; CANNOT STAND UP    DIAGNOSTIC DATA (LABS, IMAGING, TESTING) - I reviewed patient records, labs, notes, testing and imaging myself where available.  Lab Results  Component Value Date   WBC 3.3* 10/08/2015   HGB 11.6* 10/08/2015   HCT 32.8* 10/08/2015   MCV 100.3* 10/08/2015   PLT 327 10/08/2015      Component Value Date/Time   NA 137 10/09/2015 0712   NA 137 07/25/2015 1002   K 3.4* 10/09/2015 0712   K 3.7 07/25/2015 1002   CL 102 10/09/2015 0712   CL 109* 07/25/2015 1002   CO2 25 10/09/2015 0712   CO2 21 07/25/2015 1002   GLUCOSE 246* 10/09/2015 0712   GLUCOSE  167* 07/25/2015 1002   BUN 11 10/09/2015 0712   BUN 20 07/25/2015 1002   CREATININE 0.61 10/09/2015 0712   CREATININE 0.9 07/25/2015 1002   CALCIUM 8.5* 10/09/2015 0712   CALCIUM 9.1 07/25/2015 1002   PROT 5.0* 10/08/2015 0532   PROT 6.6 07/25/2015 1002   ALBUMIN 2.3* 10/08/2015 0532   ALBUMIN 3.3 07/25/2015 1002   AST 32 10/08/2015 0532   AST 136* 07/25/2015 1002   ALT 18 10/08/2015 0532   ALT 178* 07/25/2015 1002   ALKPHOS 76 10/08/2015 0532   ALKPHOS 168* 07/25/2015 1002   BILITOT 0.7 10/08/2015 0532   BILITOT 0.60 07/25/2015 1002   GFRNONAA >60 10/09/2015 0712   GFRAA >60 10/09/2015 0712   Lab Results  Component Value Date   CHOL 137 08/04/2013   HDL 36.60* 08/04/2013   LDLCALC 84 08/04/2013   TRIG 81.0 08/04/2013   CHOLHDL 4 08/04/2013   Lab Results  Component Value Date   HGBA1C 12.6* 10/06/2015   No results found for: VITAMINB12 Lab Results  Component Value Date   TSH 1.20 08/04/2013    10/06/15 MRI BRAIN [I reviewed images myself and agree with interpretation. -VRP]  1. Progression in size of multiple enhancing metastatic lesions throughout the brain as described.  2. Several of these lesions are hemorrhagic with remote blood products similar to the prior study. 3. Both acute and remote blood products are evident in the right operculum. 4. Increased diffuse T2 signal changes associated with individual lesions and about the ventricles. This represents a progression of disease as well as the sequelae of whole brain radiation.    ASSESSMENT AND PLAN  59 y.o. year old female here with seizure disorder since 10/05/15, due to metastatic breast cancer to the brain, with progression. Now stable on LEV 500mg BID.   Dx:  Metastatic cancer to brain (HCC)  Localization-related epilepsy (HCC)    PLAN: - continue LEV 500mg BID - no driving until seizure free x 6 months (not an issue because patient never had licence or drove in   her life, and not planning to  learn/start in the future) - seizure precautions and education reviewed  Meds ordered this encounter  Medications  . levETIRAcetam (KEPPRA) 500 MG tablet    Sig: Take 1 tablet (500 mg total) by mouth 2 (two) times daily.    Dispense:  180 tablet    Refill:  4   Return in about 3 months (around 01/30/2016).    VIKRAM R. PENUMALLI, MD 10/30/2015, 10:48 AM Certified in Neurology, Neurophysiology and Neuroimaging  [facility] [address], [address] [place], [place] [address] [phone]  

## 2015-10-30 NOTE — Patient Instructions (Signed)
Thank you for coming to see [place] at [facility]. I hope we have been able to provide you high quality care today.  You may receive a patient satisfaction survey over the next few weeks. We would appreciate your feedback and comments so that we may continue to improve ourselves and the health of our patients.  - continue levetiracetam 565m twice a day - no driving until seizure free x 6 months   ~~~~~~~~~~~~~~~~~~~~~~~~~~~~~~~~~~~~~~~~~~~~~~~~~~~~~~~~~~~~~~~~~  DR. PENUMALLI'S GUIDE TO HAPPY AND HEALTHY LIVING These are some of my general health and wellness recommendations. Some of them may apply to you better than others. Please use common sense as you try these suggestions and feel free to ask me any questions.   ACTIVITY/FITNESS Mental, social, emotional and physical stimulation are very important for brain and body health. Try learning a new activity (arts, music, language, sports, games).  Keep moving your body to the best of your abilities.    NUTRITION Eat more plants: colorful vegetables, nuts, seeds and berries.  Eat less sugar, salt, preservatives and processed foods.  Avoid toxins such as cigarettes and alcohol.  Drink water when you are thirsty. Warm water with a slice of lemon is an excellent morning drink to start the day.  Consider these websites for more information The Nutrition Source (hhttps://www.henry-hernandez.biz/ Precision Nutrition (wWindowBlog.ch   RELAXATION Consider practicing mindfulness meditation or other relaxation techniques such as deep breathing, prayer, yoga, tai chi, massage. See website mindful.org or the apps Headspace or Calm to help get started.   SLEEP Try to get at least 7-8+ hours sleep per day. Regular exercise and reduced caffeine will help you sleep better. Practice good sleep hygeine techniques. See website sleep.org for more information.   PLANNING Prepare estate  planning, living will, healthcare POA documents. Sometimes this is best planned with the help of an attorney. Theconversationproject.org and agingwithdignity.org are excellent resources.

## 2015-10-31 ENCOUNTER — Ambulatory Visit
Admission: RE | Admit: 2015-10-31 | Discharge: 2015-10-31 | Disposition: A | Discharge status: Home / Self Care | Payer: Medicaid Other | Source: Ambulatory Visit | Attending: Radiation Oncology | Admitting: Radiation Oncology

## 2015-10-31 ENCOUNTER — Encounter: Payer: Self-pay | Admitting: Radiation Oncology

## 2015-10-31 DIAGNOSIS — C7931 Secondary malignant neoplasm of brain: Secondary | ICD-10-CM | POA: Diagnosis not present

## 2015-10-31 NOTE — Progress Notes (Signed)
Received patient in the clinic following final radiation treatment. Provided patient with one month follow up appointment card. Thrush noted. Patient and husband are unsure of decadron frequency. Possible the patient is taking decadron 4 mg bid or tid. Will inform [name] of these findings and phone patient's husband at [phone] with further direction.

## 2015-11-01 ENCOUNTER — Other Ambulatory Visit (HOSPITAL_BASED_OUTPATIENT_CLINIC_OR_DEPARTMENT_OTHER): Payer: Medicaid Other

## 2015-11-01 ENCOUNTER — Ambulatory Visit (HOSPITAL_BASED_OUTPATIENT_CLINIC_OR_DEPARTMENT_OTHER): Payer: Medicaid Other | Admitting: Family

## 2015-11-01 ENCOUNTER — Telehealth: Payer: Self-pay

## 2015-11-01 ENCOUNTER — Encounter: Payer: Self-pay | Admitting: *Deleted

## 2015-11-01 ENCOUNTER — Telehealth: Payer: Self-pay | Admitting: Radiation Oncology

## 2015-11-01 ENCOUNTER — Ambulatory Visit: Payer: Medicaid Other

## 2015-11-01 VITALS — BP 114/82 | HR 103 | Temp 98.0°F | Resp 18

## 2015-11-01 DIAGNOSIS — M549 Dorsalgia, unspecified: Secondary | ICD-10-CM

## 2015-11-01 DIAGNOSIS — R51 Headache: Secondary | ICD-10-CM | POA: Diagnosis not present

## 2015-11-01 DIAGNOSIS — C50911 Malignant neoplasm of unspecified site of right female breast: Secondary | ICD-10-CM | POA: Diagnosis not present

## 2015-11-01 DIAGNOSIS — C50919 Malignant neoplasm of unspecified site of unspecified female breast: Secondary | ICD-10-CM | POA: Diagnosis present

## 2015-11-01 DIAGNOSIS — C7931 Secondary malignant neoplasm of brain: Secondary | ICD-10-CM | POA: Diagnosis not present

## 2015-11-01 DIAGNOSIS — R5383 Other fatigue: Secondary | ICD-10-CM

## 2015-11-01 DIAGNOSIS — Z9221 Personal history of antineoplastic chemotherapy: Secondary | ICD-10-CM | POA: Diagnosis not present

## 2015-11-01 DIAGNOSIS — Z79811 Long term (current) use of aromatase inhibitors: Secondary | ICD-10-CM | POA: Diagnosis not present

## 2015-11-01 DIAGNOSIS — C50912 Malignant neoplasm of unspecified site of left female breast: Secondary | ICD-10-CM

## 2015-11-01 DIAGNOSIS — Z170 Estrogen receptor positive status [ER+]: Secondary | ICD-10-CM

## 2015-11-01 DIAGNOSIS — E1165 Type 2 diabetes mellitus with hyperglycemia: Secondary | ICD-10-CM

## 2015-11-01 DIAGNOSIS — G629 Polyneuropathy, unspecified: Secondary | ICD-10-CM

## 2015-11-01 LAB — CMP (CANCER CENTER ONLY)
ALT: 272 U/L — AB (ref 10–47)
AST: 82 U/L — ABNORMAL HIGH (ref 11–38)
Albumin: 2 g/dL — ABNORMAL LOW (ref 3.3–5.5)
Alkaline Phosphatase: 272 U/L — ABNORMAL HIGH (ref 26–84)
BUN, Bld: 19 mg/dL (ref 7–22)
CALCIUM: 8.4 mg/dL (ref 8.0–10.3)
CHLORIDE: 102 meq/L (ref 98–108)
CO2: 22 meq/L (ref 18–33)
Creat: 0.4 mg/dl — ABNORMAL LOW (ref 0.6–1.2)
GLUCOSE: 387 mg/dL — AB (ref 73–118)
POTASSIUM: 3.5 meq/L (ref 3.3–4.7)
Sodium: 136 mEq/L (ref 128–145)
Total Bilirubin: 1.7 mg/dl — ABNORMAL HIGH (ref 0.20–1.60)
Total Protein: 5.3 g/dL — ABNORMAL LOW (ref 6.4–8.1)

## 2015-11-01 LAB — CBC WITH DIFFERENTIAL (CANCER CENTER ONLY)
BASO#: 0 10*3/uL (ref 0.0–0.2)
BASO%: 0 % (ref 0.0–2.0)
EOS%: 0.4 % (ref 0.0–7.0)
Eosinophils Absolute: 0 10*3/uL (ref 0.0–0.5)
HEMATOCRIT: 30.2 % — AB (ref 34.8–46.6)
HGB: 10.5 g/dL — ABNORMAL LOW (ref 11.6–15.9)
LYMPH#: 0.3 10*3/uL — AB (ref 0.9–3.3)
LYMPH%: 10.6 % — ABNORMAL LOW (ref 14.0–48.0)
MCH: 34.5 pg — AB (ref 26.0–34.0)
MCHC: 34.8 g/dL (ref 32.0–36.0)
MCV: 99 fL (ref 81–101)
MONO#: 0.1 10*3/uL (ref 0.1–0.9)
MONO%: 4.5 % (ref 0.0–13.0)
NEUT#: 2.2 10*3/uL (ref 1.5–6.5)
NEUT%: 84.5 % — AB (ref 39.6–80.0)
Platelets: 51 10*3/uL — ABNORMAL LOW (ref 145–400)
RBC: 3.04 10*6/uL — ABNORMAL LOW (ref 3.70–5.32)
RDW: 16.9 % — AB (ref 11.1–15.7)
WBC: 2.4 10*3/uL — ABNORMAL LOW (ref 3.9–10.0)

## 2015-11-01 LAB — TECHNOLOGIST REVIEW CHCC SATELLITE: Tech Review: 8

## 2015-11-01 MED ORDER — DOXYCYCLINE HYCLATE 100 MG PO TABS: 100.0000 mg | ORAL_TABLET | Freq: Two times a day (BID) | ORAL | Status: DC

## 2015-11-01 MED ORDER — INSULIN REGULAR HUMAN 100 UNIT/ML IJ SOLN
15.0000 [IU] | Freq: Once | INTRAMUSCULAR | Status: AC
  Administered 2015-11-01: 15 [IU] via SUBCUTANEOUS

## 2015-11-01 MED ORDER — PALBOCICLIB 125 MG PO CAPS: ORAL_CAPSULE | ORAL | Status: DC

## 2015-11-01 MED ORDER — EXEMESTANE 25 MG PO TABS: 25.0000 mg | ORAL_TABLET | Freq: Every day | ORAL | Status: DC

## 2015-11-01 MED ORDER — HEPARIN SOD (PORK) LOCK FLUSH 100 UNIT/ML IV SOLN
500.0000 [IU] | Freq: Once | INTRAVENOUS | Status: AC
  Administered 2015-11-01: 500 [IU] via INTRAVENOUS
  Filled 2015-11-01: qty 5

## 2015-11-01 MED ORDER — SODIUM CHLORIDE 0.9 % IJ SOLN
10.0000 mL | INTRAMUSCULAR | Status: DC | PRN
  Administered 2015-11-01: 10 mL via INTRAVENOUS
  Filled 2015-11-01: qty 10

## 2015-11-01 NOTE — Progress Notes (Signed)
Minus Liberty presented for Portacath access and flush. Proper placement of portacath confirmed by CXR. Portacath located in the left chest wall accessed with  H 20 needle. Clean, Dry and Intact No blood return and Portacath flushed with 46ml NS and 500U/84ml Heparin per protocol and needle removed intact. Procedure without incident. Patient tolerated procedure well.

## 2015-11-01 NOTE — Progress Notes (Signed)
[place] Work  Clinical Social Work met briefly with pt and husband after last radiation, but they were in a hurry to get husband to work once again. CSW attempted to re-educate them about resources to assist. CSW has traded phone messages with pt's sister as well on resources/ways to get assistance. Husband reports pt to be [facility] at [facility] on [facility]/01[facility]15 and CSW suggested pt and husband meet with Otilio Carpen for financial assistance. They need to reapply for ss disability as well due to severity of diagnosis. Per husband food stamps were cut off and they need letter for dss that pt can't work. Husband really struggling to care for pt at home and provide for them. A referral was done through [facility] for this pt for CNA assistance, but status is unclear currently. Sister is involved, but caregiver for their mother who is also in wheelchair and can only provide limited assistance. CSW made referral to Otilio Carpen as well. CSW awaits return call from sister, who husband and pt reports "to handle things".    Clinical Social Work interventions: Supportive Psychiatric nurse education and referral  Loren Racer, [place] Worker [place]  [place] Phone: [phone] Fax: [phone]

## 2015-11-01 NOTE — Telephone Encounter (Signed)
Received call from [place], lab tech reporting panic ALT of 272. Dr Marin Olp aware. dph

## 2015-11-01 NOTE — Progress Notes (Signed)
Hematology and Oncology Follow Up Visit  FLOWER FRANKO [id number] [birth year] 59 y.o. 11/01/2015   Principle Diagnosis:  Metastatic breast cancer- Triple positive  Current Therapy:   Aromasin 25 mg PO daily Ibrance 125 mg PO daily (21/7)    Interim History: Ms. Gardiner is here today with her husband for a follow-up. She was hospitalized with seizures in October. She completed 12 fractions of radiation to the brain yesterday. She still states that she has a dull headache most of the time.   She has not had any more seizures since leaving the hospital.  Her right breast mass has opened up and is draining purulent fluid with a foul odor. She is tender on palpation in that area. We will start her on Doxycycline 10 mg BID for 14 days.  She is eating well and staying hydrated. Her weight is stable  She denies fever, chills, n/v, rash, cough, chest pain, palpitations, abdominal pain, no changes in her bowel or bladder habits.   She has some SOB with exertion and some dizziness. She is weak and is not able to walk a great distance before becoming fatigued.  She states that she hurt in her back and bottom and just took oxycodone which she brought from home. This has her feeling a bit more fatigued.  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  In August, her CA 27.29 was 124.   Medications:    Medication List       This list is accurate as of: 11/01/15  1:43 PM.  Always use your most recent med list.               ACCU-CHEK AVIVA PLUS test strip  Generic drug:  glucose blood  USE TO CHECK BLOOD GLUCOSE 4 TIMES DAILY AFTER MEALS AND AT BEDTIME     ACCU-CHEK SOFTCLIX LANCETS lancets  USE TO CHECK BLOOD GLUCOSE 4 TIMES DAILY     dexamethasone 4 MG tablet  Commonly known as:  DECADRON  Take 1 tablet (4 mg total) by mouth 3 (three) times daily.     doxycycline 100 MG tablet  Commonly known as:  VIBRA-TABS  TAKE 1 TABLET BY MOUTH EVERY 12 HOURS *3 MORE DAYS FROM 10/09/15*     exemestane 25 MG tablet  Commonly known as:  AROMASIN  Take 1 tablet (25 mg total) by mouth daily after breakfast.     gabapentin 300 MG capsule  Commonly known as:  NEURONTIN  Take 1 capsule (300 mg total) by mouth 3 (three) times daily.     glucose monitoring kit monitoring kit  1 each by Does not apply route 4 (four) times daily - after meals and at bedtime. 1 month Diabetic Testing Supplies for QAC-QHS accuchecks.     ACCU-CHEK AVIVA PLUS W/DEVICE Kit  USE TO CHECK BLOOD GLUCOSE 4 TIMES DAILY     insulin aspart 100 UNIT/ML FlexPen  Commonly known as:  NOVOLOG FLEXPEN  0-9 Units, Subcutaneous, 3 times daily with meals CBG < 70: call MD CBG 70 - 120: 0 units CBG 121 - 150: 1 unit CBG 151 - 200: 2 units CBG 201 - 250: 3 units CBG 251 - 300: 5 units CBG 301 - 350: 7 units CBG 351 - 400: 9 units CBG > 400: call MD  Okay to substitute with different band at equivalent dosing     Insulin Glargine 100 UNIT/ML Solostar Pen  Commonly known as:  LANTUS SOLOSTAR  Inject 20 Units into  the skin daily at 10 pm. Okay to substitute with different band at equivalent dosing     Insulin Pen Needle 32G X 8 MM Misc  Please provide 2 month supply. Okay to substitute with different brand     B-D ULTRAFINE III SHORT PEN 31G X 8 MM Misc  Generic drug:  Insulin Pen Needle  USE WITH LANTUS AND NOVOLOG     levETIRAcetam 500 MG tablet  Commonly known as:  KEPPRA  Take 1 tablet (500 mg total) by mouth 2 (two) times daily.     nystatin 100000 UNIT/ML suspension  Commonly known as:  MYCOSTATIN     ondansetron 8 MG tablet  Commonly known as:  ZOFRAN  Take 1 tablet (8 mg total) by mouth 2 (two) times daily. Start the day after chemo for 3 days. Then take as needed for nausea or vomiting.     oxyCODONE 5 MG immediate release tablet  Commonly known as:  Oxy IR/ROXICODONE  Take 1-3 tablets (5-15 mg total) by mouth every 6 (six) hours as needed for severe pain. Take 1-2 if needed for pain due to cancer.      palbociclib 125 MG capsule  Commonly known as:  IBRANCE  Take whole with food every day for 21 days and then off for 7 days     pantoprazole 40 MG tablet  Commonly known as:  PROTONIX  Take 1 tablet (40 mg total) by mouth 2 (two) times daily.     potassium chloride SA 20 MEQ tablet  Commonly known as:  K-DUR,KLOR-CON  Take 1 tablet (20 mEq total) by mouth daily.        Allergies:  Allergies  Allergen Reactions  . Hydrocodone     "makes me crawl on the floor"  . Penicillins Swelling    Per pt's sister, swelling of throat  . Aspirin Other (See Comments)    Due to ulcers    Past Medical History, Surgical history, Social history, and Family History were reviewed and updated.  Review of Systems: All other 10 point review of systems is negative.   Physical Exam:  vitals were not taken for this visit.  Wt Readings from Last 3 Encounters:  10/31/15 152 lb 14.4 oz (69.355 kg)  10/06/15 153 lb 3.5 oz (69.5 kg)  08/28/15 162 lb (73.483 kg)    Ocular: Sclerae unicteric, pupils equal, round and reactive to light Ear-nose-throat: Oropharynx clear, dentition fair Lymphatic: No cervical or supraclavicular adenopathy Lungs no rales or rhonchi, good excursion bilaterally Heart regular rate and rhythm, no murmur appreciated Abd soft, nontender, positive bowel sounds MSK no focal spinal tenderness, no joint edema Neuro: non-focal, well-oriented, appropriate affect Breasts: The wound to he right breast has now opened up and is draining purulent fluid with a foul odor. No changes with left breast. No new lymphadenopathy.   Lab Results  Component Value Date   WBC 3.3* 10/08/2015   HGB 11.6* 10/08/2015   HCT 32.8* 10/08/2015   MCV 100.3* 10/08/2015   PLT 327 10/08/2015   Lab Results  Component Value Date   FERRITIN 151 02/21/2015   IRON 47 02/21/2015   TIBC 398 02/21/2015   UIBC 351 02/21/2015   IRONPCTSAT 12* 02/21/2015   Lab Results  Component Value Date   RBC 3.27*  10/08/2015   No results found for: KPAFRELGTCHN, LAMBDASER, KAPLAMBRATIO No results found for: IGGSERUM, IGA, IGMSERUM No results found for: TOTALPROTELP, ALBUMINELP, A1GS, A2GS, BETS, BETA2SER, [place], [place], SPEI   Chemistry  Component Value Date/Time   NA 137 10/09/2015 0712   NA 137 07/25/2015 1002   K 3.4* 10/09/2015 0712   K 3.7 07/25/2015 1002   CL 102 10/09/2015 0712   CL 109* 07/25/2015 1002   CO2 25 10/09/2015 0712   CO2 21 07/25/2015 1002   BUN 11 10/09/2015 0712   BUN 20 07/25/2015 1002   CREATININE 0.61 10/09/2015 0712   CREATININE 0.9 07/25/2015 1002      Component Value Date/Time   CALCIUM 8.5* 10/09/2015 0712   CALCIUM 9.1 07/25/2015 1002   ALKPHOS 76 10/08/2015 0532   ALKPHOS 168* 07/25/2015 1002   AST 32 10/08/2015 0532   AST 136* 07/25/2015 1002   ALT 18 10/08/2015 0532   ALT 178* 07/25/2015 1002   BILITOT 0.7 10/08/2015 0532   BILITOT 0.60 07/25/2015 1002     Impression and Plan: Ms. Verret is 59 year old Afro-American female with metastatic breast cancer. She is really having a rough time. She is complaining of a dull continuous headache and also pain in her back and bottom from where she has been tired and sitting more. She is feeling fatigued at this time.  She completed 12 fractions of palliative radiation to the brain yesterday.  The right breast wound has now opened up and is draining purulent fluid with a foul odor. We will go ahead and start her Doxycycline 100 mg BID for 14 days. [name] will also discuss palliative radiation to the wound area with [name].  Hospice was discussed with the patient. For now, she would prefer to stay on Aromasin and Ibrance.  We will look at possibly scheduling scans at her next appointment.  Her platelet count today is 51. No episodes of bleeding. Hgb is 10.5 at this time.  We will plan to see her back in 2 weeks for labs and follow-up.  Both she and her husband know to contact any questions or concerns.  We can certainly see her again sooner if needed.  This was a joint visit with [name]. He is in agreement with the aforementioned.   Eliezer Bottom, NP 11/4/20161:43 PM   Addendum:  I spoke with Ms. Chumney and her husband. She recently completed radiation for her brain metastases. I'm not sure how much this really will help.  She is on Aromasin with Ibrance. Her tumor is TRIPLE POSITIVE. She has been through chemotherapy. She is in no shape to take chemotherapy. Overall, her performance status is ECOG 3.  She has this tumor in the right chest wall that is breaking through the skin. I will see if radiation can help with this.  I believe that she will ultimately succumb to the brain metastases. I talked to her and her husband about this.  Her CA 27.29 has gradually gone up. Is now 199.  I really want to consider trying to transition to palliative/ comfort care. Again I just don't believe that there is any systemic chemotherapy that she would qualify for.  She has done incredibly well considering how she first showed up a year and one half ago.  I feel bad for her. I know that she is trying her best. I know her husband is trying to do the right thing for her.  I would like to see her back in another couple weeks. At that point, we might consider trying to get her into hospice if we don't see some type of dramatic recovery.  We spent close to an hour with she and  her husband.  Lum Keas

## 2015-11-01 NOTE — Telephone Encounter (Signed)
Phoned patient with decadron taper instructions.

## 2015-11-01 NOTE — Patient Instructions (Signed)

## 2015-11-01 NOTE — Patient Instructions (Signed)
Insulin Aspart injection What is this medicine? INSULIN ASPART (IN su lin AS part) is a human-made form of insulin. This drug lowers the amount of sugar in your blood. It is a fast acting insulin that starts working faster than regular insulin. It will not work as long as regular insulin. This medicine may be used for other purposes; ask your health care provider or pharmacist if you have questions. What should I tell my health care provider before I take this medicine? They need to know if you have any of these conditions: -episodes of hypoglycemia -kidney disease -liver disease -an unusual or allergic reaction to insulin, metacresol, other medicines, foods, dyes, or preservatives -pregnant or trying to get pregnant -breast-feeding How should I use this medicine? This medicine is for injection under the skin. Use exactly as directed. It is important to follow the directions given to you by your health care professional or doctor. You should start your meal within 5 to 10 minutes after injection. Have food ready before injection. Do not delay eating. You will be taught how to use this medicine and how to adjust doses for activities and illness. Do not use more insulin than prescribed. Do not use more or less often than prescribed. Always check the appearance of your insulin before using it. This medicine should be clear and colorless like water. Do not use if it is cloudy, thickened, colored, or has solid particles in it. It is important that you put your used needles and syringes in a special sharps container. Do not put them in a trash can. If you do not have a sharps container, call your pharmacist or healthcare provider to get one. Talk to your pediatrician regarding the use of this medicine in children. While this drug may be prescribed for children as young as 2 years of age for selected conditions, precautions do apply. Overdosage: If you think you have taken too much of this medicine contact  a poison control center or emergency room at once. NOTE: This medicine is only for you. Do not share this medicine with others. What if I miss a dose? It is important not to miss a dose. Your health care professional or doctor should discuss a plan for missed doses with you. If you do miss a dose, follow their plan. Do not take double doses. What may interact with this medicine? -other medicines for diabetes Many medications may cause an increase or decrease in blood sugar, these include: -alcohol containing beverages -aspirin and aspirin-like drugs -chloramphenicol -chromium -diuretics -female hormones, like estrogens or progestins and birth control pills -heart medicines -isoniazid -female hormones or anabolic steroids -medicines for weight loss -medicines for allergies, asthma, cold, or cough -medicines for mental problems -medicines called MAO Inhibitors like Nardil, Parnate, Marplan, Eldepryl -niacin -NSAIDs, medicines for pain and inflammation, like ibuprofen or naproxen -pentamidine -phenytoin -probenecid -quinolone antibiotics like ciprofloxacin, levofloxacin, ofloxacin -some herbal dietary supplements -steroid medicines like prednisone or cortisone -thyroid medicine Some medications can hide the warning symptoms of low blood sugar. You may need to monitor your blood sugar more closely if you are taking one of these medications. These include: -beta-blockers such as atenolol, metoprolol, propranolol -clonidine -guanethidine -reserpine This list may not describe all possible interactions. Give your health care provider a list of all the medicines, herbs, non-prescription drugs, or dietary supplements you use. Also tell them if you smoke, drink alcohol, or use illegal drugs. Some items may interact with your medicine. What should I watch for while  using this medicine? Visit your health care professional or doctor for regular checks on your progress. A test called the HbA1C  (A1C) will be monitored. This is a simple blood test. It measures your blood sugar control over the last 2 to 3 months. You will receive this test every 3 to 6 months. Learn how to check your blood sugar. Learn the symptoms of low and high blood sugar and how to manage them. Always carry a quick-source of sugar with you in case you have symptoms of low blood sugar. Examples include hard sugar candy or glucose tablets. Make sure others know that you can choke if you eat or drink when you develop serious symptoms of low blood sugar, such as seizures or unconsciousness. They must get medical help at once. Tell your doctor or health care professional if you have high blood sugar. You might need to change the dose of your medicine. If you are sick or exercising more than usual, you might need to change the dose of your medicine. Do not skip meals. Ask your doctor or health care professional if you should avoid alcohol. Many nonprescription cough and cold products contain sugar or alcohol. These can affect blood sugar. Make sure that you have the right kind of syringe for the type of insulin you use. Try not to change the brand and type of insulin or syringe unless your health care professional or doctor tells you to. Switching insulin brand or type can cause dangerously high or low blood sugar. Always keep an extra supply of insulin, syringes, and needles on hand. Use a syringe one time only. Throw away syringe and needle in a closed container to prevent accidental needle sticks. Insulin pens and cartridges should never be shared. Even if the needle is changed, sharing may result in passing of viruses like hepatitis or HIV. Wear a medical ID bracelet or chain, and carry a card that describes your disease and details of your medicine and dosage times. What side effects may I notice from receiving this medicine? Side effects that you should report to your health care professional or doctor as soon as  possible: -allergic reactions like skin rash, itching or hives, swelling of the face, lips, or tongue -breathing problems -signs and symptoms of high blood sugar such as dizziness, dry mouth, dry skin, fruity breath, nausea, stomach pain, increased hunger or thirst, increased urination -signs and symptoms of low blood sugar such as feeling anxious, confusion, dizziness, increased hunger, unusually weak or tired, sweating, shakiness, cold, irritable, headache, blurred vision, fast heartbeat, loss of consciousness Side effects that usually do not require medical attention (report to your health care professional or doctor if they continue or are bothersome): -increase or decrease in fatty tissue under the skin due to overuse of a particular injection site -itching, burning, swelling, or rash at site where injected This list may not describe all possible side effects. Call your doctor for medical advice about side effects. You may report side effects to FDA at 1-800-FDA-1088. Where should I keep my medicine? Keep out of the reach of children. Store unopened insulin vials in a refrigerator between 2 and 8 degrees C (36 and 46 degrees F). Do not freeze or use if the insulin has been frozen. Opened vials (vials currently in use) may be stored in the refrigerator or at room temperature, at approximately 30 degrees C (86 degrees F) or cooler. Keeping your insulin at room temperature decreases the amount of pain during injection. Once opened,  your insulin can be used for 28 days. After 28 days, the vial of insulin should be thrown away. Store unopened cartridges, FlexPens, or Novalog Innolet systems in a refrigerator between 2 and 8 degrees C (36 and 46 degrees F.) Do not freeze or use if the insulin has been frozen. Once opened, the Novalog Innolet system, FlexPen, and cartridges that are inserted into pens should be kept at room temperature, approximately 25 degrees C (77 degrees F) or cooler. Do not store in  the refrigerator. Once opened, the insulin can be used for 28 days. After 28 days, the cartridge, Novalog Innolet system or FlexPen should be thrown away. Protect from light and excessive heat. Throw away any unused medicine after the expiration date or after the specified time for room temperature storage has passed. NOTE: This sheet is a summary. It may not cover all possible information. If you have questions about this medicine, talk to your doctor, pharmacist, or health care provider.    2016, [employer]. (2014-03-09 10:23:01)

## 2015-11-02 LAB — CANCER ANTIGEN 27.29: CA 27.29: 199 U/mL — AB (ref 0–39)

## 2015-11-02 LAB — LACTATE DEHYDROGENASE: LDH: 661 U/L — ABNORMAL HIGH (ref 94–250)

## 2015-11-05 ENCOUNTER — Inpatient Hospital Stay (HOSPITAL_COMMUNITY)
Admission: EM | Admit: 2015-11-05 | Discharge: 2015-11-10 | DRG: 871 | Disposition: A | Discharge status: Home Health | Payer: Medicaid Other | Attending: Family Medicine | Admitting: Family Medicine

## 2015-11-05 ENCOUNTER — Telehealth: Payer: Self-pay | Admitting: Radiation Oncology

## 2015-11-05 ENCOUNTER — Other Ambulatory Visit: Payer: Self-pay | Admitting: *Deleted

## 2015-11-05 ENCOUNTER — Emergency Department (HOSPITAL_COMMUNITY): Payer: Medicaid Other

## 2015-11-05 ENCOUNTER — Telehealth: Payer: Self-pay | Admitting: *Deleted

## 2015-11-05 ENCOUNTER — Encounter (HOSPITAL_COMMUNITY): Payer: Self-pay | Admitting: Emergency Medicine

## 2015-11-05 DIAGNOSIS — I959 Hypotension, unspecified: Secondary | ICD-10-CM | POA: Diagnosis present

## 2015-11-05 DIAGNOSIS — F329 Major depressive disorder, single episode, unspecified: Secondary | ICD-10-CM | POA: Diagnosis present

## 2015-11-05 DIAGNOSIS — Z880 Allergy status to penicillin: Secondary | ICD-10-CM | POA: Diagnosis not present

## 2015-11-05 DIAGNOSIS — Z6826 Body mass index (BMI) 26.0-26.9, adult: Secondary | ICD-10-CM

## 2015-11-05 DIAGNOSIS — S21001S Unspecified open wound of right breast, sequela: Secondary | ICD-10-CM

## 2015-11-05 DIAGNOSIS — Z794 Long term (current) use of insulin: Secondary | ICD-10-CM

## 2015-11-05 DIAGNOSIS — S21001A Unspecified open wound of right breast, initial encounter: Secondary | ICD-10-CM | POA: Diagnosis present

## 2015-11-05 DIAGNOSIS — B379 Candidiasis, unspecified: Secondary | ICD-10-CM | POA: Diagnosis present

## 2015-11-05 DIAGNOSIS — R627 Adult failure to thrive: Secondary | ICD-10-CM | POA: Diagnosis present

## 2015-11-05 DIAGNOSIS — R740 Nonspecific elevation of levels of transaminase and lactic acid dehydrogenase [LDH]: Secondary | ICD-10-CM | POA: Diagnosis present

## 2015-11-05 DIAGNOSIS — Z823 Family history of stroke: Secondary | ICD-10-CM

## 2015-11-05 DIAGNOSIS — D649 Anemia, unspecified: Secondary | ICD-10-CM | POA: Diagnosis present

## 2015-11-05 DIAGNOSIS — E1165 Type 2 diabetes mellitus with hyperglycemia: Secondary | ICD-10-CM | POA: Diagnosis present

## 2015-11-05 DIAGNOSIS — C50919 Malignant neoplasm of unspecified site of unspecified female breast: Secondary | ICD-10-CM | POA: Diagnosis present

## 2015-11-05 DIAGNOSIS — E871 Hypo-osmolality and hyponatremia: Secondary | ICD-10-CM | POA: Diagnosis present

## 2015-11-05 DIAGNOSIS — Z923 Personal history of irradiation: Secondary | ICD-10-CM | POA: Diagnosis not present

## 2015-11-05 DIAGNOSIS — I10 Essential (primary) hypertension: Secondary | ICD-10-CM | POA: Diagnosis present

## 2015-11-05 DIAGNOSIS — J690 Pneumonitis due to inhalation of food and vomit: Secondary | ICD-10-CM | POA: Diagnosis present

## 2015-11-05 DIAGNOSIS — A408 Other streptococcal sepsis: Secondary | ICD-10-CM | POA: Diagnosis not present

## 2015-11-05 DIAGNOSIS — Z9221 Personal history of antineoplastic chemotherapy: Secondary | ICD-10-CM | POA: Diagnosis not present

## 2015-11-05 DIAGNOSIS — Z806 Family history of leukemia: Secondary | ICD-10-CM

## 2015-11-05 DIAGNOSIS — Z515 Encounter for palliative care: Secondary | ICD-10-CM

## 2015-11-05 DIAGNOSIS — Z8711 Personal history of peptic ulcer disease: Secondary | ICD-10-CM

## 2015-11-05 DIAGNOSIS — Z825 Family history of asthma and other chronic lower respiratory diseases: Secondary | ICD-10-CM

## 2015-11-05 DIAGNOSIS — R4182 Altered mental status, unspecified: Secondary | ICD-10-CM

## 2015-11-05 DIAGNOSIS — Z66 Do not resuscitate: Secondary | ICD-10-CM | POA: Diagnosis present

## 2015-11-05 DIAGNOSIS — D696 Thrombocytopenia, unspecified: Secondary | ICD-10-CM | POA: Diagnosis present

## 2015-11-05 DIAGNOSIS — E43 Unspecified severe protein-calorie malnutrition: Secondary | ICD-10-CM | POA: Diagnosis present

## 2015-11-05 DIAGNOSIS — C7931 Secondary malignant neoplasm of brain: Secondary | ICD-10-CM | POA: Diagnosis present

## 2015-11-05 DIAGNOSIS — C50911 Malignant neoplasm of unspecified site of right female breast: Secondary | ICD-10-CM | POA: Diagnosis present

## 2015-11-05 DIAGNOSIS — Z7189 Other specified counseling: Secondary | ICD-10-CM | POA: Diagnosis not present

## 2015-11-05 DIAGNOSIS — A419 Sepsis, unspecified organism: Principal | ICD-10-CM | POA: Diagnosis present

## 2015-11-05 DIAGNOSIS — G92 Toxic encephalopathy: Secondary | ICD-10-CM | POA: Diagnosis present

## 2015-11-05 DIAGNOSIS — Z8249 Family history of ischemic heart disease and other diseases of the circulatory system: Secondary | ICD-10-CM

## 2015-11-05 DIAGNOSIS — J189 Pneumonia, unspecified organism: Secondary | ICD-10-CM | POA: Diagnosis present

## 2015-11-05 DIAGNOSIS — A4101 Sepsis due to Methicillin susceptible Staphylococcus aureus: Secondary | ICD-10-CM | POA: Diagnosis not present

## 2015-11-05 DIAGNOSIS — R569 Unspecified convulsions: Secondary | ICD-10-CM | POA: Diagnosis present

## 2015-11-05 HISTORY — DX: Unspecified convulsions: R56.9

## 2015-11-05 LAB — I-STAT TROPONIN, ED: Troponin i, poc: 0.01 ng/mL (ref 0.00–0.08)

## 2015-11-05 LAB — CBC WITH DIFFERENTIAL/PLATELET
BASOS ABS: 0 10*3/uL (ref 0.0–0.1)
Basophils Relative: 0 %
EOS ABS: 0 10*3/uL (ref 0.0–0.7)
Eosinophils Relative: 1 %
HEMATOCRIT: 27.9 % — AB (ref 36.0–46.0)
Hemoglobin: 9.7 g/dL — ABNORMAL LOW (ref 12.0–15.0)
LYMPHS PCT: 7 %
Lymphs Abs: 0.3 10*3/uL — ABNORMAL LOW (ref 0.7–4.0)
MCH: 34.5 pg — ABNORMAL HIGH (ref 26.0–34.0)
MCHC: 34.8 g/dL (ref 30.0–36.0)
MCV: 99.3 fL (ref 78.0–100.0)
MONO ABS: 0.1 10*3/uL (ref 0.1–1.0)
MONOS PCT: 2 %
NEUTROS PCT: 90 %
NRBC: 2 /100{WBCs} — AB
Neutro Abs: 4 10*3/uL (ref 1.7–7.7)
PLATELETS: 80 10*3/uL — AB (ref 150–400)
RBC: 2.81 MIL/uL — ABNORMAL LOW (ref 3.87–5.11)
RDW: 18.5 % — AB (ref 11.5–15.5)
WBC: 4.4 10*3/uL (ref 4.0–10.5)

## 2015-11-05 LAB — BASIC METABOLIC PANEL
ANION GAP: 7 (ref 5–15)
BUN: 16 mg/dL (ref 6–20)
CALCIUM: 8.5 mg/dL — AB (ref 8.9–10.3)
CO2: 23 mmol/L (ref 22–32)
Chloride: 102 mmol/L (ref 101–111)
Creatinine, Ser: 0.52 mg/dL (ref 0.44–1.00)
GFR calc Af Amer: >60 mL/min (ref >60)
GFR calc non Af Amer: >60 mL/min (ref >60)
GLUCOSE: 277 mg/dL — AB (ref 65–99)
Potassium: 4.7 mmol/L (ref 3.5–5.1)
SODIUM: 132 mmol/L — AB (ref 135–145)

## 2015-11-05 LAB — URINALYSIS, ROUTINE W REFLEX MICROSCOPIC
BILIRUBIN URINE: NEGATIVE
Glucose, UA: >1000 mg/dL — AB
HGB URINE DIPSTICK: NEGATIVE
KETONES UR: NEGATIVE mg/dL
Leukocytes, UA: NEGATIVE
NITRITE: NEGATIVE
PROTEIN: NEGATIVE mg/dL
SPECIFIC GRAVITY, URINE: 1.019 (ref 1.005–1.030)
UROBILINOGEN UA: 4 mg/dL — AB (ref 0.0–1.0)
pH: 6 (ref 5.0–8.0)

## 2015-11-05 LAB — I-STAT CG4 LACTIC ACID, ED: LACTIC ACID, VENOUS: 1.22 mmol/L (ref 0.5–2.0)

## 2015-11-05 LAB — URINE MICROSCOPIC-ADD ON

## 2015-11-05 MED ORDER — GABAPENTIN 300 MG PO CAPS
300.0000 mg | ORAL_CAPSULE | Freq: Three times a day (TID) | ORAL | Status: DC
  Administered 2015-11-06 – 2015-11-10 (×14): 300 mg via ORAL
  Filled 2015-11-05 (×16): qty 1

## 2015-11-05 MED ORDER — SODIUM CHLORIDE 0.9 % IV BOLUS (SEPSIS)
1000.0000 mL | Freq: Once | INTRAVENOUS | Status: AC
  Administered 2015-11-05: 1000 mL via INTRAVENOUS

## 2015-11-05 MED ORDER — INSULIN ASPART 100 UNIT/ML ~~LOC~~ SOLN
0.0000 [IU] | Freq: Three times a day (TID) | SUBCUTANEOUS | Status: DC
  Administered 2015-11-06 (×2): 5 [IU] via SUBCUTANEOUS
  Administered 2015-11-06: 11 [IU] via SUBCUTANEOUS
  Administered 2015-11-07: 15 [IU] via SUBCUTANEOUS
  Administered 2015-11-07: 5 [IU] via SUBCUTANEOUS

## 2015-11-05 MED ORDER — LORAZEPAM 2 MG/ML IJ SOLN: 0.5000 mg | INTRAMUSCULAR | Status: DC | PRN

## 2015-11-05 MED ORDER — MORPHINE SULFATE (PF) 4 MG/ML IV SOLN
4.0000 mg | INTRAVENOUS | Status: DC | PRN
  Administered 2015-11-05: 4 mg via INTRAVENOUS
  Filled 2015-11-05: qty 1

## 2015-11-05 MED ORDER — INSULIN ASPART PROT & ASPART (70-30 MIX) 100 UNIT/ML ~~LOC~~ SUSP
3.0000 [IU] | Freq: Once | SUBCUTANEOUS | Status: AC
  Administered 2015-11-05: 3 [IU] via SUBCUTANEOUS
  Filled 2015-11-05: qty 10

## 2015-11-05 MED ORDER — VANCOMYCIN HCL IN DEXTROSE 1-5 GM/200ML-% IV SOLN: 1000.0000 mg | Freq: Once | INTRAVENOUS | Status: DC

## 2015-11-05 MED ORDER — METRONIDAZOLE IN NACL 5-0.79 MG/ML-% IV SOLN
500.0000 mg | Freq: Three times a day (TID) | INTRAVENOUS | Status: DC
  Administered 2015-11-05: 500 mg via INTRAVENOUS
  Filled 2015-11-05: qty 100

## 2015-11-05 MED ORDER — INSULIN ASPART 100 UNIT/ML ~~LOC~~ SOLN
0.0000 [IU] | Freq: Every day | SUBCUTANEOUS | Status: DC
  Administered 2015-11-06: 3 [IU] via SUBCUTANEOUS
  Administered 2015-11-06: 2 [IU] via SUBCUTANEOUS
  Administered 2015-11-08: 3 [IU] via SUBCUTANEOUS
  Administered 2015-11-09: 2 [IU] via SUBCUTANEOUS

## 2015-11-05 MED ORDER — DEXTROSE 5 % IV SOLN
2.0000 g | Freq: Once | INTRAVENOUS | Status: AC
  Administered 2015-11-06: 2 g via INTRAVENOUS
  Filled 2015-11-05: qty 2

## 2015-11-05 MED ORDER — LEVETIRACETAM 500 MG PO TABS
500.0000 mg | ORAL_TABLET | Freq: Two times a day (BID) | ORAL | Status: DC
  Administered 2015-11-06 – 2015-11-10 (×10): 500 mg via ORAL
  Filled 2015-11-05 (×11): qty 1

## 2015-11-05 MED ORDER — ONDANSETRON HCL 4 MG/2ML IJ SOLN
4.0000 mg | Freq: Four times a day (QID) | INTRAMUSCULAR | Status: DC | PRN
  Administered 2015-11-10: 4 mg via INTRAVENOUS
  Filled 2015-11-05: qty 2

## 2015-11-05 MED ORDER — SODIUM CHLORIDE 0.9 % IV SOLN
INTRAVENOUS | Status: DC
  Administered 2015-11-05 – 2015-11-08 (×6): via INTRAVENOUS

## 2015-11-05 MED ORDER — VANCOMYCIN HCL IN DEXTROSE 750-5 MG/150ML-% IV SOLN
750.0000 mg | Freq: Two times a day (BID) | INTRAVENOUS | Status: DC
  Administered 2015-11-05 – 2015-11-07 (×4): 750 mg via INTRAVENOUS
  Filled 2015-11-05 (×5): qty 150

## 2015-11-05 MED ORDER — DEXTROSE 5 % IV SOLN
2.0000 g | Freq: Three times a day (TID) | INTRAVENOUS | Status: DC
  Filled 2015-11-05: qty 2

## 2015-11-05 MED ORDER — DEXAMETHASONE 2 MG PO TABS
4.0000 mg | ORAL_TABLET | Freq: Three times a day (TID) | ORAL | Status: DC
  Administered 2015-11-06 – 2015-11-07 (×5): 4 mg via ORAL
  Filled 2015-11-05 (×5): qty 2

## 2015-11-05 MED ORDER — ONDANSETRON HCL 4 MG PO TABS: 4.0000 mg | ORAL_TABLET | Freq: Four times a day (QID) | ORAL | Status: DC | PRN

## 2015-11-05 MED ORDER — INSULIN GLARGINE 100 UNIT/ML ~~LOC~~ SOLN
20.0000 [IU] | Freq: Every day | SUBCUTANEOUS | Status: DC
  Administered 2015-11-06 – 2015-11-07 (×3): 20 [IU] via SUBCUTANEOUS
  Filled 2015-11-05 (×3): qty 0.2

## 2015-11-05 NOTE — Telephone Encounter (Signed)
-----   Message from Tyler Pita, MD sent at 10/31/2015  9:40 PM EDT ----- Regarding: RE: Decadron taper?/Thrush We can taper to 2 mg BID now, and then 2 mg QD in 2 weeks, then follow-up.   ----- Message -----    From: Heywood Footman, RN    Sent: 10/31/2015   4:38 PM      To: Tyler Pita, MD Subject: Decadron taper?/Thrush                         [name].  Patient completed radiation today. I gave her a one month follow up appointment card. She still has thrush. Her husband is unsure of how often she take decadron. She was prescribed decadron 4 mg tid. However, I saw a 10/12 note indicating she takes it bid only. Do you want her to taper or wait until her follow up in a month? Do you want Diflucan called in?  Sam

## 2015-11-05 NOTE — ED Provider Notes (Signed)
CSN: [id number]     Arrival date & time 11/05/15  1646 History   First MD Initiated Contact with Patient 11/05/15 1721     Chief Complaint  Patient presents with  . Cancer     (Consider location/radiation/quality/duration/timing/severity/associated sxs/prior Treatment) HPI Comments: Patient is 59 year old female with past medical history of metastatic breast cancer who presents to the emergency department via EMS complaint of altered mental status. Patient's sister reports the patient's husband gave her 5 mg oxycodone around noon this afternoon for pain and states when she came to see the patient around 1 PM the patient was sleeping. Sister reports when the patient woke up she appeared to be very fatigued and "out of it". She notes the pt began breathing in shallow, gasping breaths which resulted in her calling EMS. Sister states the pt had her last radiation tx on Friday. She notes the pt's wound on her right breast from breast tumor has been getting worse over the past few days. Patient is currently on Doxycycline for wound. She notes the wound initially had a yellow/green drainage and states she is now having clear malodorous drainage. Denies fever. Patient denies any pain or complaints at this time.    Past Medical History  Diagnosis Date  . Arthritis   . Depression   . Fainting   . Headache   . Hypertension   . Migraines   . History of blood transfusion   . Gastric ulcer   . Breast cancer metastasized to multiple sites [facility]) 04/12/2014    10/30/15 radiation, chemotherapy  . Seizures [facility])    Past Surgical History  Procedure Laterality Date  . Abdominal hysterectomy  15 years go    partial   Family History  Problem Relation Age of Onset  . Arthritis Other   . Hypertension Mother   . Hypertension Other   . Heart disease Other   . Stroke Father   . Cancer Sister     leukemia   Social History  Substance Use Topics  . Smoking status: Never Smoker   . Smokeless tobacco:  Never Used     Comment: never used tobacco  . Alcohol Use: No   OB History    No data available     Review of Systems  Unable to perform ROS: Mental status change      Allergies  Hydrocodone; Penicillins; and Aspirin  Home Medications   Prior to Admission medications   Medication Sig Start Date End Date Taking? Authorizing Provider  ACCU-CHEK AVIVA PLUS test strip USE TO CHECK BLOOD GLUCOSE 4 TIMES DAILY AFTER MEALS AND AT BEDTIME 10/10/15  Yes Historical Provider, MD  ACCU-CHEK SOFTCLIX LANCETS lancets USE TO CHECK BLOOD GLUCOSE 4 TIMES DAILY 10/10/15  Yes Historical Provider, MD  B-D ULTRAFINE III SHORT PEN 31G X 8 MM MISC USE WITH LANTUS AND NOVOLOG 10/10/15  Yes Historical Provider, MD  Blood Glucose Monitoring Suppl (ACCU-CHEK AVIVA PLUS) W/DEVICE KIT USE TO CHECK BLOOD GLUCOSE 4 TIMES DAILY 10/10/15  Yes Historical Provider, MD  dexamethasone (DECADRON) 4 MG tablet Take 1 tablet (4 mg total) by mouth 3 (three) times daily. Patient taking differently: Take 4 mg by mouth 2 (two) times daily.  10/09/15  Yes Shanker Kristeen Mans, MD  exemestane (AROMASIN) 25 MG tablet Take 1 tablet (25 mg total) by mouth daily after breakfast. 11/01/15  Yes Eliezer Bottom, NP  gabapentin (NEURONTIN) 300 MG capsule Take 1 capsule (300 mg total) by mouth 3 (three) times daily. 08/28/15  Yes Volanda Napoleon, MD  glucose monitoring kit (FREESTYLE) monitoring kit 1 each by Does not apply route 4 (four) times daily - after meals and at bedtime. 1 month Diabetic Testing Supplies for QAC-QHS accuchecks. 10/09/15  Yes Shanker Kristeen Mans, MD  insulin aspart (NOVOLOG FLEXPEN) 100 UNIT/ML FlexPen 0-9 Units, Subcutaneous, 3 times daily with meals CBG < 70: call MD CBG 70 - 120: 0 units CBG 121 - 150: 1 unit CBG 151 - 200: 2 units CBG 201 - 250: 3 units CBG 251 - 300: 5 units CBG 301 - 350: 7 units CBG 351 - 400: 9 units CBG > 400: call MD  Okay to substitute with different band at equivalent dosing  10/09/15  Yes Shanker Kristeen Mans, MD  Insulin Glargine (LANTUS SOLOSTAR) 100 UNIT/ML Solostar Pen Inject 20 Units into the skin daily at 10 pm. Okay to substitute with different band at equivalent dosing 10/09/15  Yes Shanker Kristeen Mans, MD  Insulin Pen Needle 32G X 8 MM MISC Please provide 2 month supply. Okay to substitute with different brand 10/09/15  Yes Shanker Kristeen Mans, MD  levETIRAcetam (KEPPRA) 500 MG tablet Take 1 tablet (500 mg total) by mouth 2 (two) times daily. 10/30/15  Yes Penni Bombard, MD  nystatin (MYCOSTATIN) 100000 UNIT/ML suspension Take 5 mLs by mouth 4 (four) times daily as needed (mouth pain).  10/18/15  Yes Historical Provider, MD  ondansetron (ZOFRAN) 8 MG tablet Take 1 tablet (8 mg total) by mouth 2 (two) times daily. Start the day after chemo for 3 days. Then take as needed for nausea or vomiting. 10/09/15  Yes Shanker Kristeen Mans, MD  oxyCODONE (OXY IR/ROXICODONE) 5 MG immediate release tablet Take 1-3 tablets (5-15 mg total) by mouth every 6 (six) hours as needed for severe pain. Take 1-2 if needed for pain due to cancer. 10/09/15  Yes Shanker Kristeen Mans, MD  palbociclib Leslee [facility]) 125 MG capsule Take whole with food every day for 21 days and then off for 7 days 11/01/15  Yes Eliezer Bottom, NP  potassium chloride SA (K-DUR,KLOR-CON) 20 MEQ tablet Take 1 tablet (20 mEq total) by mouth daily. 10/09/15  Yes Shanker Kristeen Mans, MD  pantoprazole (PROTONIX) 40 MG tablet Take 1 tablet (40 mg total) by mouth 2 (two) times daily. Patient not taking: Reported on 11/05/2015 10/09/15   Jonetta Osgood, MD   BP 123/61 mmHg  Pulse 85  Temp(Src) 98.7 F (37.1 C) (Oral)  Resp 12  SpO2 100% Physical Exam  Constitutional: She appears well-developed and well-nourished. No distress.  HENT:  Head: Normocephalic and atraumatic.  Mouth/Throat: Oropharynx is clear and moist. No oropharyngeal exudate.  Eyes: Conjunctivae and EOM are normal. Pupils are equal, round, and reactive to  light. Right eye exhibits no discharge. Left eye exhibits no discharge. No scleral icterus.  Neck: Normal range of motion. Neck supple.  Cardiovascular: Normal rate, regular rhythm, normal heart sounds and intact distal pulses.   No murmur heard. Pulmonary/Chest: Effort normal and breath sounds normal. No respiratory distress. She has no wheezes. She has no rales. She exhibits no tenderness.  Abdominal: Soft. Bowel sounds are normal. She exhibits no distension and no mass. There is no tenderness. There is no rebound and no guarding.  Musculoskeletal: She exhibits no edema.  Lymphadenopathy:    She has no cervical adenopathy.  Neurological: She is alert.  Pt is alert and opens eyes when being spoken to and answers questions intermittently but continues to fall  asleep throughout exam. She is able to move all extremities.   Skin: Skin is warm and dry. She is not diaphoretic.  Large open wound to right lateral breast with yellow malodorous drainage, TTP.  Nursing note and vitals reviewed.   ED Course  Procedures (including critical care time) Labs Review Labs Reviewed  CBC WITH DIFFERENTIAL/PLATELET - Abnormal; Notable for the following:    RBC 2.81 (*)    Hemoglobin 9.7 (*)    HCT 27.9 (*)    MCH 34.5 (*)    RDW 18.5 (*)    Platelets 80 (*)    nRBC 2 (*)    Lymphs Abs 0.3 (*)    All other components within normal limits  URINALYSIS, ROUTINE W REFLEX MICROSCOPIC (NOT AT [facility]) - Abnormal; Notable for the following:    Color, Urine AMBER (*)    Glucose, UA >1000 (*)    Urobilinogen, UA 4.0 (*)    All other components within normal limits  BASIC METABOLIC PANEL - Abnormal; Notable for the following:    Sodium 132 (*)    Glucose, Bld 277 (*)    Calcium 8.5 (*)    All other components within normal limits  URINE MICROSCOPIC-ADD ON - Abnormal; Notable for the following:    Casts GRANULAR CAST (*)    All other components within normal limits  CULTURE, BLOOD (ROUTINE X 2)  CULTURE,  BLOOD (ROUTINE X 2)  MRSA PCR SCREENING  COMPREHENSIVE METABOLIC PANEL  CBC  OSMOLALITY  OSMOLALITY, URINE  I-STAT TROPOININ, ED  I-STAT CG4 LACTIC ACID, ED    Imaging Review Dg Chest 2 View  11/05/2015  CLINICAL DATA:  Shortness of breath. Last radiation treatment for breast cancer 4 days ago. EXAM: CHEST  2 VIEW COMPARISON:  10/06/2015 FINDINGS: Left IJ Port-A-Cath unchanged. Lungs are hypoinflated with mild elevation of the left hemidiaphragm unchanged. There is there is new mild perihilar bibasilar airspace opacification left-greater-than-right which may be due to edema versus infection. Suggestion of small bilateral pleural effusions which are new. Cardiomediastinal silhouette and remainder of the exam is unchanged. IMPRESSION: New hazy airspace process over the perihilar bibasilar regions left greater than right which may be due to edema versus infection. Small bilateral effusions. Electronically Signed   By: Marin Olp M.D.   On: 11/05/2015 19:02   I have personally reviewed and evaluated these images and lab results as part of my medical decision-making.   EKG Interpretation   Date/Time:  Tuesday November 05 2015 17:11:18 EST Ventricular Rate:  98 PR Interval:  163 QRS Duration: 94 QT Interval:  366 QTC Calculation: 467 R Axis:   57 Text Interpretation:  Sinus rhythm No significant change since last  tracing Confirmed by ALLEN  MD, ANTHONY ([id number]) on 11/05/2015 8:55:59 PM      MDM   Final diagnoses:  Altered mental status, unspecified altered mental status type  Metastatic breast cancer ([place])  Healthcare-associated pneumonia    Patient presents with altered mental status reported SOB by family. History of metastatic breast cancer. Family reports patient 5 mg oxycodone pain pills this afternoon and became drowsy and very sleepy after. Last radiation treatment was 5 days ago. VSS. On exam patient is alert to verbal stimuli, she answers questions intermittently and is  oriented to person and place, she intermittently falls asleep throughout exam, she intermittently follows commands. Large open wound noted to right lateral breast, no surrounding erythema or swelling, yellow drainage noted. Patient is currently on doxycycline for wound. Patient's  family reports patient is typically more awake at home and this is not her baseline mental status.  EKG showed normal sinus rhythm. Troponin 0.01. Chest x-ray showed new hazy airspace process over the perihilar bibasilar regions left greater than right which may be due to edema versus infection, small bilateral effusions. Labs and urine unremarkable. Lactic acid ordered. Patient started on IV antibiotics to tx suspected aspiration pneumonia. Pt was last admitted 10/8. Consulted hospitalist, Dr. Loleta Books agrees to admission. Discussed results and plan for admission with patient and family.      Chesley Noon [place], [place] 11/06/15 [phone]

## 2015-11-05 NOTE — Progress Notes (Signed)
ANTIBIOTIC CONSULT NOTE - INITIAL  Pharmacy Consult for Vancomycin Indication: rule out pneumonia  Allergies  Allergen Reactions  . Hydrocodone     "makes me crawl on the floor"  . Penicillins Swelling    Per pt's sister, swelling of throat  . Aspirin Other (See Comments)    Due to ulcers    Patient Measurements:   Last documented weight 69 kg (10/31/15)  Vital Signs: Temp: 98.6 F (37 C) (11/08 1708) Temp Source: Oral (11/08 1708) BP: 100/62 mmHg (11/08 1930) Pulse Rate: 98 (11/08 1930) Intake/Output from previous day:   Intake/Output from this shift:    Labs:  Recent Labs  11/05/15 1821  WBC 4.4  HGB 9.7*  PLT 80*  CREATININE 0.52   Estimated Creatinine Clearance: 69.1 mL/min (by C-G formula based on Cr of 0.52). No results for input(s): VANCOTROUGH, VANCOPEAK, VANCORANDOM, GENTTROUGH, GENTPEAK, GENTRANDOM, TOBRATROUGH, TOBRAPEAK, TOBRARND, AMIKACINPEAK, AMIKACINTROU, AMIKACIN in the last 72 hours.   Microbiology: No results found for this or any previous visit (from the past 720 hour(s)).  Medical History: Past Medical History  Diagnosis Date  . Arthritis   . Depression   . Fainting   . Headache   . Hypertension   . Migraines   . History of blood transfusion   . Gastric ulcer   . Breast cancer metastasized to multiple sites [facility]) 04/12/2014    10/30/15 radiation, chemotherapy    Medications:  Anti-infectives    Start     Dose/Rate Route Frequency Ordered Stop   11/05/15 2200  aztreonam (AZACTAM) 2 g in dextrose 5 % 50 mL IVPB  Status:  Discontinued     2 g 100 mL/hr over 30 Minutes Intravenous 3 times per day 11/05/15 2102 11/05/15 2127   11/05/15 2130  metroNIDAZOLE (FLAGYL) IVPB 500 mg     500 mg 100 mL/hr over 60 Minutes Intravenous Every 8 hours 11/05/15 2127       Assessment: 40 yoF [facility]8 with AMS and SOB.  PMH is significant for metastatic breast cancer on oral chemotherapy Aromasin with Ibrance, and right breast  wound from tumor, recently started outpatient Doxycycline 11/5.  CXR shows right > left hazy airspace process over the perihilar bibasilar regions.  First doses of Aztreonam and Flagyl are ordered in ED for possible aspiration pneumonia.  Pharmacy is consulted to dose Vancomycin.  Today, 11/05/2015:  Tmax: afebrile  WBC 4.4 (pt takes dexamethasone PTA)  SCr 0.52 with CrCl ~ 69 ml/min  Goal of Therapy:  Vancomycin trough level 15-20 mcg/ml  Plan:   Vancomycin 750 mg IV q12h.  Measure Vanc trough at steady state.  Follow up renal fxn, culture results, and clinical course.   Gretta Arab PharmD, BCPS Pager [phone] 11/05/2015 9:12 PM

## 2015-11-05 NOTE — ED Notes (Signed)
Hospitalist at bedside 

## 2015-11-05 NOTE — Telephone Encounter (Signed)
Patient's sister states she is no longer able to care for her. She's minimally responsive and unable to participate in any activities of daily living. Sister has attempted to call several nursing homes in the area, but hasn't received calls back. She is afraid patient has an infection because she states the onset of symptoms was quick. Told patient that she needs to bring patient to the Emergency Room for assessment due to the rapid onset of symptoms to rule out other causes than natural progression of disease. Informed sister that if patient is admitted, they could help with discharge to a facility. She states she will call EMS for transport. Dr Marin Olp notified.

## 2015-11-05 NOTE — Telephone Encounter (Signed)
Phoned patient's sister, Shauna Hugh. Explained decadron taper per Dr. Johny Shears direction. [name] verbalized understanding. Also, [name] requested she be informed of all medication changes. She explained the patient's husband becomes confused easily thus, she is assuming responsibility for the patient's medications.

## 2015-11-05 NOTE — ED Notes (Addendum)
Per EMS-called out for SOB. However, patient is not in respiratory distress. Family reports she was taking "shallow, gasping breaths." Denies SOB. Hx breast CA- now in brain and spine. Last radiation treatment was Friday. CBG 333 mg/dl. Pt appears fatigued. Pt is full code. VS: BP 130/82 HR 90 SpO2 97% on RA. No c/o pain.  Pt's cousin states, "the doctor told her there is nothing else they can do for her. They aren't dressing the hole on the right breast. She got her last radiation treatment Friday. Her husband is next of kin."   Pt has right breast open wound with malodorous serosanguineous drainage and slough. Dry dressing over wound.

## 2015-11-05 NOTE — ED Provider Notes (Signed)
Medical screening examination/treatment/procedure(s) were conducted as a shared visit with non-physician practitioner(s) and myself.  I personally evaluated the patient during the encounter.   EKG Interpretation   Date/Time:  Tuesday November 05 2015 17:11:18 EST Ventricular Rate:  98 PR Interval:  163 QRS Duration: 94 QT Interval:  366 QTC Calculation: 467 R Axis:   57 Text Interpretation:  Sinus rhythm No significant change since last  tracing Confirmed by Mohini Heathcock  MD, Rickey Sadowski ([id number]) on 11/05/2015 8:55:59 PM     Patient here with altered mental status after taking pain medication. Chest x-ray with possible aspiration pneumonia. Was started on antibiotics and admit  Mary Leigh, MD 11/05/15 2056

## 2015-11-05 NOTE — Progress Notes (Signed)
EDCM spoke to patient and her sister and patient's husband at bedside.  Patient lives at home with her husband.  Patient appears very drowsy, received permission from patient to speak to her sister and her husband.  Patient's sister reports patient has an Therapist, sports from [facility] who comes out Tuesday and Friday who takes her vitals, changes dressing on her breast, and gives patient her insulin shot if her sister doesn't get to the house on time.  Patient also has services with [facility] a certified aide, comes to patient's home Mon Wed Fri for 3 hours and on Thurs for 3.5 hours.  Patient's sister reports patient has not walked in six weeks.  EDCM asked if patient has been out of bed?  Patient's sister reports the patient has been up sitting in living room on sofa.  Patient requires more assistance at this time as she has become increasingly weaker.  Patient has a bedside commode, rolator, two bath chairs, and a cane.  Patient's family expressed interest in a wheelchair and and a lift chair.  Patient's family reports patient has finished radiation on Thursday and is currently taking "chemo pills."  Patient's oncologist is Dr. Jonette Eva.  Patient's pcp is Dr. Nancy Fetter who she is scheduled to have an appointment with tomorrow.  Patient's husband name is Jeneen Rinks [phone].  Patient does not have a POA.  Patient's family interested in having PT for patient at home.  Patient with Medicaid insurance and will not cover home health PT.  EDCM provided patient's family with list of home health agencies in [place] highlighting [facility] and list of private duty nursing agencies.  Patient's family thankful for services.  No further EDCM needs at this time.

## 2015-11-05 NOTE — Progress Notes (Signed)
Pt arrived to 1238 at 2315.  VS's stable, pt is in severe pain from the wound on the right lateral/upper breast.  AOx4.  Pt is resting comfortably with husband at the bedside.  RN will continue to monitor.

## 2015-11-05 NOTE — ED Notes (Signed)
Patient transported to X-ray 

## 2015-11-05 NOTE — H&P (Signed)
History and Physical  Patient Name: Mary [name]     JFH:[id number]    DOB: 1956/08/20    DOA: 11/05/2015 Referring physician: Harlene Ramus, PA-C and Vivi Martens, MD PCP: Sandi Mariscal, MD      Chief Complaint: Altered mental status  HPI: Mary [name] is a 59 y.o. female with a past medical history significant for metastatic breast cancer c/b numerous hemorrhagic brain mets with seizures last month as well as a suppurative non-healing breast mass who presents with altered mental status.  History is collected from the patient's sister and husband were present at the bedside. Since her admission for seizures 1 month ago the patient has been recuperating at home with her husband. She is not able to walk much anymore and mostly sits on the couch, but is conversant and able to eat most days.  Over the last 4 days the patient has complained of malaise and her sister has noticed decreased activity. This afternoon after lunch when the patient's sister arrived to take care of her, she found the patient sluggish and "out of it" and breathing in an agonal way.  In the ED, the patient was tachycardic but afebrile and breathing normally on ambient air.  Her urinalysis showed no signs of infection, but a chest x-ray had new bilateral asymmetric airspace opacities. She was started on vancomycin and aztreonam given her medical comorbidities, recent hospitalization and a reported penicillin allergy.       Review of Systems:  Not able to reliably obtain due to patient mentation.   Allergies: Penicillin, throat closing. Aspirin due to GI bleeding. Hydrocodone intolerance.   Home medications: 1. Dexamethasone 4 mg 3 times daily 2. Exemestane 25 mg daily 3. palbociclib (Ibrance) 125 mg daily 3 out of 4 weeks 4. Pantoprazole 40 mg daily 5. Doxycycline 100 mg twice daily for the last week 6. Potassium 20 mg once daily 7. Oxycodone 5-15 mg every 6 hours as needed for pain 8. Ondansetron 8 mg as needed  for nausea 9. Levetiracetam 500 mg twice daily 10. Glargine 20 units nightly 11. NovoLog sliding scale 12. Gabapentin 300 mg 3 times daily  Past medical history: 1. Metastatic breast cancer, ECOG 3  Followed by [name]  Initially treated with Carboplatin/Gemzar Burley Saver /Herceptin  C/b metastases to the brain and seizures and a draining breast wound 2. Hypertension 3. Migraines 4. History of PUD  Past surgical history: 1. Hysterectomy  Family history:  Mother, hypertension. Father, stroke. Sister, leukemia, bronchitis.   Social History:  Patient lives with her husband. She is a never smoker. She is no longer able to ambulate because of weakness.        Physical Exam: BP 111/58 mmHg  Pulse 90  Temp(Src) 98.6 F (37 C) (Oral)  Resp 14  SpO2 99% General appearance: Adult female, eyes closed, opens eyes to questions, but does not consistently answer, moans with pain with movement or exam.   ENT: No nasal deformity, discharge, or epistaxis.  OP tacky and lips dry without lesions.   Skin: Warm and flushed.  On the right breast, there is a large, gaping wound, with some mild foul-smelling odor, and tenderness around, with mild surrounding erythema and induration. Cardiac: Tachycardic, regular.  Flow murmur.  Capillary refill is brisk.  No LE edema.  Distal pulses bounding. Respiratory: Shallow breathing.  CTAB without rales or wheezes. Abdomen: Abdomen soft without rigidity or guarding.  Moderate diffuse TTP to light touch. No ascites, distension.   Neuro: Not oriented  to place or situation.  Speech is occasionally coherent. Unable to test further due to difficulty with following commands. Psych: Attention diminished, otherwise unable to evaluate.       Labs on Admission:  The metabolic panel shows hyponatremia and hyperglycemia. The serum creatinine is normal. The complete blood count shows WBC 4.4K per UL, anemia 9.7 g/dL which is down from previous last  month. Platelet count 80K/uL down from last month, up from last week. Lactic acid level is normal. The urinalysis shows glucosuria and granular casts without evidence of infection. Troponin is negative  Radiological Exams on Admission: Personally reviewed: Dg Chest 2 View 11/05/2015  Bilateral airspace opacities, perihilar distribution, asymmetric right greater than left.   EKG: Independently reviewed. Sinus tachycardia with no ST changes.    Assessment/Plan 1. Sepsis from presumed HCAP:  This is new.  Suspected source lung. Organism unknown, recently on doxycycline for right breast cellulitis. Patient meets criteria given tachycardia, tachypnea, and evidence of organ dysfunction.  Blood cultures drawn after administration of antibiotics.  Urine clear.  Lactic acid level normal. -Vancomycin and aztreonam, renally dosed -Admit to stepdown -Fluid resuscitation -Follow blood cultures -Strep pneumo and urinary Legionella antigens    2. Hyponatremia:  This is new.  Likely due to sepsis. -Urine osmolality and serum osmolality line -Fluid resuscitation and repeat CMP  3. Anemia and thrombocytopenia: This is chronic, worse.  Suspect bone marrow failure.  There is no clinical history of exam findings to suggest hemorrhage. -Trend CBC  4. Insulin-dependent diabetes:  Hyperglycemic at admission. -Continue glargine 20 units nightly -Sliding scale NovoLog corrections  5. Breast cancer, metastatic:  -Continue immunotherapy -Continue oxycodone, gabapentin; add lorazepam, and morphine IV when necessary -Consult to palliative care for goals of care discussion  6. Seizures:  Due to brain metastasis. No evidence of active seizures. -Continue levetiracetam -Continue dexamethasone     DVT PPx: SCDs Diet: Carb modified Consultants: Palliative Care Code Status: DO NOT INTUBATE Family Communication: The patient's diagnosis of suspected pneumonia and her expected treatment plan were  discussed with the sister and husband present at the bedside. Her CODE STATUS was clarified: Her husband believes that she would not want to be intubated, but that she would want attempts at CPR.   I was clear that given her functional status I did not recommend this. Although the patient's oncology notes state that the incurable nature of her cancer has been disclosed to the family, the family tell me that their hope is that she will recover from this illness (meaning first this infection, and then her cancer) and be able to go back to doing all the things that she "used to do", meaning, before her cancer diagnosis. This is informed by the sister's experience with childhood leukemia in which she was completely cured, she states against all odds.  Medical decision making: What exists of the patient's previous chart was reviewed in depth and the case was discussed with Harlene Ramus. Patient seen 10:20 PM on 11/05/2015.  Disposition Plan:  Admit to step down for IV antibitoics and fluids.  Consult to palliative care when stabilized for goals of care with family.      Edwin Dada [facility] Pager [phone]

## 2015-11-06 DIAGNOSIS — R569 Unspecified convulsions: Secondary | ICD-10-CM

## 2015-11-06 DIAGNOSIS — C50911 Malignant neoplasm of unspecified site of right female breast: Secondary | ICD-10-CM

## 2015-11-06 DIAGNOSIS — C7931 Secondary malignant neoplasm of brain: Secondary | ICD-10-CM

## 2015-11-06 DIAGNOSIS — I10 Essential (primary) hypertension: Secondary | ICD-10-CM

## 2015-11-06 DIAGNOSIS — D696 Thrombocytopenia, unspecified: Secondary | ICD-10-CM

## 2015-11-06 DIAGNOSIS — S21001A Unspecified open wound of right breast, initial encounter: Secondary | ICD-10-CM

## 2015-11-06 DIAGNOSIS — A408 Other streptococcal sepsis: Secondary | ICD-10-CM

## 2015-11-06 DIAGNOSIS — D649 Anemia, unspecified: Secondary | ICD-10-CM | POA: Diagnosis present

## 2015-11-06 LAB — COMPREHENSIVE METABOLIC PANEL
ALBUMIN: 1.7 g/dL — AB (ref 3.5–5.0)
ALK PHOS: 168 U/L — AB (ref 38–126)
ALT: 55 U/L — ABNORMAL HIGH (ref 14–54)
ANION GAP: 6 (ref 5–15)
AST: 16 U/L (ref 15–41)
BILIRUBIN TOTAL: 1.8 mg/dL — AB (ref 0.3–1.2)
BUN: 15 mg/dL (ref 6–20)
CALCIUM: 7.9 mg/dL — AB (ref 8.9–10.3)
CO2: 24 mmol/L (ref 22–32)
Chloride: 105 mmol/L (ref 101–111)
Creatinine, Ser: 0.52 mg/dL (ref 0.44–1.00)
GFR calc non Af Amer: >60 mL/min (ref >60)
GLUCOSE: 266 mg/dL — AB (ref 65–99)
POTASSIUM: 4.2 mmol/L (ref 3.5–5.1)
SODIUM: 135 mmol/L (ref 135–145)
TOTAL PROTEIN: 4.8 g/dL — AB (ref 6.5–8.1)

## 2015-11-06 LAB — GLUCOSE, CAPILLARY
GLUCOSE-CAPILLARY: 221 mg/dL — AB (ref 65–99)
GLUCOSE-CAPILLARY: 255 mg/dL — AB (ref 65–99)
Glucose-Capillary: 234 mg/dL — ABNORMAL HIGH (ref 65–99)
Glucose-Capillary: 234 mg/dL — ABNORMAL HIGH (ref 65–99)

## 2015-11-06 LAB — CBC
HEMATOCRIT: 24.5 % — AB (ref 36.0–46.0)
HEMOGLOBIN: 8.6 g/dL — AB (ref 12.0–15.0)
MCH: 35 pg — ABNORMAL HIGH (ref 26.0–34.0)
MCHC: 35.1 g/dL (ref 30.0–36.0)
MCV: 99.6 fL (ref 78.0–100.0)
Platelets: 78 10*3/uL — ABNORMAL LOW (ref 150–400)
RBC: 2.46 MIL/uL — AB (ref 3.87–5.11)
RDW: 18.7 % — ABNORMAL HIGH (ref 11.5–15.5)
WBC: 3 10*3/uL — ABNORMAL LOW (ref 4.0–10.5)

## 2015-11-06 LAB — MRSA PCR SCREENING: MRSA BY PCR: INVALID — AB

## 2015-11-06 LAB — AMMONIA: Ammonia: 27 umol/L (ref 9–35)

## 2015-11-06 LAB — OSMOLALITY: OSMOLALITY: 290 mosm/kg (ref 275–300)

## 2015-11-06 LAB — PREALBUMIN: Prealbumin: 6.2 mg/dL — ABNORMAL LOW (ref 18–38)

## 2015-11-06 MED ORDER — DAKINS (1/4 STRENGTH) 0.125 % EX SOLN
Freq: Two times a day (BID) | CUTANEOUS | Status: AC
Start: 2015-11-06 — End: 2015-11-09
  Administered 2015-11-06: 1 via TOPICAL
  Administered 2015-11-07: 10:00:00 via TOPICAL
  Administered 2015-11-07: 1 via TOPICAL
  Administered 2015-11-08: 10:00:00 via TOPICAL
  Administered 2015-11-08: 1 via TOPICAL
  Filled 2015-11-06: qty 473

## 2015-11-06 MED ORDER — CETYLPYRIDINIUM CHLORIDE 0.05 % MT LIQD
7.0000 mL | Freq: Two times a day (BID) | OROMUCOSAL | Status: DC
  Administered 2015-11-07 – 2015-11-10 (×6): 7 mL via OROMUCOSAL

## 2015-11-06 MED ORDER — MORPHINE SULFATE (PF) 2 MG/ML IV SOLN
2.0000 mg | INTRAVENOUS | Status: DC | PRN
  Administered 2015-11-06 – 2015-11-10 (×13): 2 mg via INTRAVENOUS
  Filled 2015-11-06 (×13): qty 1

## 2015-11-06 MED ORDER — AZTREONAM 2 G IJ SOLR
2.0000 g | Freq: Three times a day (TID) | INTRAMUSCULAR | Status: DC
  Administered 2015-11-06 – 2015-11-07 (×6): 2 g via INTRAVENOUS
  Filled 2015-11-06 (×9): qty 2

## 2015-11-06 NOTE — Care Management Note (Signed)
Case Management Note  Patient Details  Name: [name] MRN: [id number] Date of Birth: [birth year]  Subjective/Objective:                sepsis    Action/Plan:Date: November 06, 2015 Chart reviewed for concurrent status and case management needs. Will continue to follow patient for changes and needs: Velva Harman, RN, BSN, [place]   [phone]   Expected Discharge Date:                  Expected Discharge Plan:  Home/Self Care  In-House Referral:  NA  Discharge planning Services  CM Consult  Post Acute Care Choice:  NA Choice offered to:  NA  DME Arranged:    DME Agency:     HH Arranged:    HH Agency:     Status of Service:  In process, will continue to follow  Medicare Important Message Given:    Date Medicare IM Given:    Medicare IM give by:    Date Additional Medicare IM Given:    Additional Medicare Important Message give by:     If discussed at [place] of Stay Meetings, dates discussed:    Additional Comments:  Leeroy Cha, RN 11/06/2015, 10:57 AM

## 2015-11-06 NOTE — Progress Notes (Signed)
Mary [name] NTZ:[id number] DOB: [birth year] DOA: 11/05/2015 PCP: Sandi Mariscal, MD  Brief narrative: 59 y/o ? Triple + Br Ca + Brain Mets foll [name] diag 03/2014 s/p XRT +n 5 Cycles Chemo Recurrent sz 10/06/15 and admitted Htn Chr hyponatremia IDDM Chr wound to R breast on Abx [doxycycline]  Patient admitted from home with ~ 1 month of progressive weakness as well as debility and failure to thrive. Poor appetite since last admission 10/06/15. On admission noted to be very sleepy and inconsistently responses which is not her typical Workup on admission concerning for new airspace process perihilar bibasilar L >R and admitted for concerns of aspiration pneumonia    Past medical history-As per Problem list Chart reviewed as below- Reviewed  Consultants:  Oncology  Palliative care  Procedures:  Chest x-ray  Antibiotics:  Aztreonam 11/8  Vancomycin 11/8   Subjective  Alert Can tell me that she is at [facility], can't tell me the year and the season but slow responses No overt concerns of pain Nursing reports sleepy and drowsy throughout the evening Not awake enough to eat or drink    Objective    Interim History:  Telemetry: Sinus tachycardia    Objective: Filed Vitals:   11/06/15 0300 11/06/15 0400 11/06/15 0500 11/06/15 0600  BP: 104/71 93/67 101/81 105/68  Pulse: 88 88 87 84  Temp:  98.4 F (36.9 C)    TempSrc:  Oral    Resp: 27 24 20 22   SpO2: 100% 99% 100% 100%    Intake/Output Summary (Last 24 hours) at 11/06/15 0728 Last data filed at 11/05/15 1851  Gross per 24 hour  Intake      0 ml  Output   1125 ml  Net  -1125 ml    Exam:  General: EOMI NCAT bald, mucosa dry, no icterus noted. Did not evaluate for thrush Cardiovascular: S1-S2 tachycardic, across upper right chest wall there is a 10 by 7 cm irregular wound in the upper outer quadrant of the breast with malodorous smell which is packed. Respiratory: Clinically clear no  added sound Abdomen: Slightly tender bilaterally no rebound no guarding Skin no lower extremity edema Neuro able to lift arms and 5/5 power but overall weak, sensory is intact  Data Reviewed: Basic Metabolic Panel:  Recent Labs Lab 11/01/15 1328 11/05/15 1821 11/06/15 0229  NA 136 132* 135  K 3.5 4.7 4.2  CL 102 102 105  CO2 22 23 24   GLUCOSE 387* 277* 266*  BUN 19 16 15   CREATININE 0.4* 0.52 0.52  CALCIUM 8.4 8.5* 7.9*   Liver Function Tests:  Recent Labs Lab 11/01/15 1328 11/06/15 0229  AST 82* 16  ALT 272* 55*  ALKPHOS 272* 168*  BILITOT 1.70* 1.8*  PROT 5.3* 4.8*  ALBUMIN 2.0* 1.7*   No results for input(s): LIPASE, AMYLASE in the last 168 hours. No results for input(s): AMMONIA in the last 168 hours. CBC:  Recent Labs Lab 11/01/15 1323 11/05/15 1821 11/06/15 0229  WBC 2.4 Corrected for nRBC* 4.4 3.0*  NEUTROABS 2.2 4.0  --   HGB 10.5* 9.7* 8.6*  HCT 30.2* 27.9* 24.5*  MCV 99 99.3 99.6  PLT 51* 80* 78*   Cardiac Enzymes: No results for input(s): CKTOTAL, CKMB, CKMBINDEX, TROPONINI in the last 168 hours. BNP: Invalid input(s): POCBNP CBG:  Recent Labs Lab 11/06/15 0133  GLUCAP 234*    Recent Results (from the past 240 hour(s))  MRSA PCR Screening     Status: Abnormal  Collection Time: 11/05/15 11:46 PM  Result Value Ref Range Status   MRSA by PCR INVALID RESULTS, SPECIMEN SENT FOR CULTURE (A) NEGATIVE Final    Comment: RESULT CALLED TO, READ BACK BY AND VERIFIED WITH: GARCIA,K RN [phone] [place],N        The GeneXpert MRSA Assay (FDA approved for NASAL specimens only), is one component of a comprehensive MRSA colonization surveillance program. It is not intended to diagnose MRSA infection nor to guide or monitor treatment for MRSA infections.      Studies:              All Imaging reviewed and is as per above notation   Scheduled Meds: . aztreonam  2 g Intravenous Q8H  . dexamethasone  4 mg Oral 3 times per day  .  gabapentin  300 mg Oral TID  . insulin aspart  0-15 Units Subcutaneous TID WC  . insulin aspart  0-5 Units Subcutaneous QHS  . insulin glargine  20 Units Subcutaneous Q2200  . levETIRAcetam  500 mg Oral BID  . vancomycin  750 mg Intravenous Q12H   Continuous Infusions: . sodium chloride 125 mL/hr at 11/06/15 0648     Assessment/Plan:  1. Possible aspiration pneumonia-agree with coverage of vancomycin and aztreonam. Another source for sepsis could possibly be right breast cancer which apparently has a discharge.  Repeat CBC plus differential in a.m., repeat chest x-ray two-view a.m. still tachycardic, lactic acid 1.2 to so meets criteria for step down at present and would monitor 2. 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  Patient may benefit from palliative care consult otherwise once hemodynamically stable for discharge this can be followed as an outpatient  3.  seizures secondary to metastatic breast cancer-continue Keppra 500 twice a day, continue Decadron 4 mg 3 times a day--defer to oncology when to taper 4. Insulin-dependent diabetic-continue Lantus 20 units subcutaneous, moderate sliding scale coverage 4 times a day before meals. Blood sugars ranging between 220 and 260 range 5. Chronic hyponatremia-resolved with IV saline-no further workup 6. Relative pancytopenia secondary to infection, cancer. Watch counts closely, platelet count seems to be in the 80 range at baseline 7. Transaminitis-LFTs deranged on admission alkaline phosphatase 272 AST ALT 82/272, total bili 1.7-likely secondary to metastatic spread of cancer. Counts are now coming down. Monitor  Code Status: appears to be partial code. Given functional decline not sure how realistic this is. We will ask oncology for an opinion  Family Communication: Husband's number is [phone].  With respect to oncology will  update him Disposition Plan: Inpatient at present   Verneita Griffes, MD  [facility] Pager [phone] 11/06/2015, 7:28 AM    LOS: 1 day

## 2015-11-06 NOTE — Consult Note (Addendum)
WOC wound consult note Reason for Consult: Right breast wound. I last saw this patient on 10/05/15 and the wound is considerably more necrotic than it was at that time. Wound type: Neoplastic Pressure Ulcer POA: No Measurement: 4cm x 9cm x 3cm, but true depth is obscured by the presence of necrotic, firmly adherent tissue. Wound bed: As described above Drainage (amount, consistency, odor) light blue-tinged, malodorous exudate on old dressing.  Dressing changed 8 hours ago at 4am. Periwound: Intact, dry. Dressing procedure/placement/frequency: I will implement three days of twice daily dressing changes with 1/4 strength Dakin's solution as both a cleanser and as a moistening agent for the dressing material to act as an autolytic debriding agent, an odor-control intervention and a bacteria-reducing agent.  Following this, we can return to twice daily saline dressings. [place] nursing team will not follow, but will remain available to this patient, the nursing and medical teams.  Please re-consult if needed. Thanks, Maudie Flakes, MSN, RN, [employer], Ar[facility] Abbott  Pager# [phone]

## 2015-11-06 NOTE — Progress Notes (Signed)
Rx Brief Antibiotic note:  Azactam    See 11/9 note by C Shade for full details  Plan: Azactam 2gm IV q8h F/u SCr/cultures  Lawana Pai R 11/06/2015 12:04 AM

## 2015-11-07 DIAGNOSIS — Z7189 Other specified counseling: Secondary | ICD-10-CM

## 2015-11-07 DIAGNOSIS — J189 Pneumonia, unspecified organism: Secondary | ICD-10-CM

## 2015-11-07 DIAGNOSIS — A409 Streptococcal sepsis, unspecified: Secondary | ICD-10-CM

## 2015-11-07 DIAGNOSIS — D649 Anemia, unspecified: Secondary | ICD-10-CM

## 2015-11-07 DIAGNOSIS — R4182 Altered mental status, unspecified: Secondary | ICD-10-CM | POA: Insufficient documentation

## 2015-11-07 DIAGNOSIS — Z515 Encounter for palliative care: Secondary | ICD-10-CM

## 2015-11-07 LAB — MRSA CULTURE

## 2015-11-07 LAB — GLUCOSE, CAPILLARY
GLUCOSE-CAPILLARY: 215 mg/dL — AB (ref 65–99)
GLUCOSE-CAPILLARY: 355 mg/dL — AB (ref 65–99)
GLUCOSE-CAPILLARY: 311 mg/dL — AB (ref 65–99)
GLUCOSE-CAPILLARY: 303 mg/dL — AB (ref 65–99)
Glucose-Capillary: 347 mg/dL — ABNORMAL HIGH (ref 65–99)

## 2015-11-07 LAB — OSMOLALITY, URINE: Osmolality, Ur: 610 mOsm/kg (ref 390–1090)

## 2015-11-07 LAB — STREP PNEUMONIAE URINARY ANTIGEN: Strep Pneumo Urinary Antigen: NEGATIVE

## 2015-11-07 MED ORDER — TOBRAMYCIN 0.3 % OP SOLN
2.0000 [drp] | Freq: Four times a day (QID) | OPHTHALMIC | Status: DC
  Administered 2015-11-07 – 2015-11-10 (×13): 2 [drp] via OPHTHALMIC
  Filled 2015-11-07 (×2): qty 5

## 2015-11-07 MED ORDER — ALTEPLASE 2 MG IJ SOLR
2.0000 mg | Freq: Once | INTRAMUSCULAR | Status: AC
  Administered 2015-11-07: 2 mg
  Filled 2015-11-07: qty 2

## 2015-11-07 MED ORDER — SODIUM CHLORIDE 0.9 % IJ SOLN
10.0000 mL | INTRAMUSCULAR | Status: DC | PRN
  Administered 2015-11-08 – 2015-11-10 (×2): 10 mL
  Filled 2015-11-07 (×2): qty 40

## 2015-11-07 MED ORDER — ENSURE ENLIVE PO LIQD
237.0000 mL | Freq: Two times a day (BID) | ORAL | Status: DC
  Administered 2015-11-07 – 2015-11-08 (×2): 237 mL via ORAL

## 2015-11-07 MED ORDER — VITAMINS A & D EX OINT
TOPICAL_OINTMENT | CUTANEOUS | Status: AC
  Filled 2015-11-07: qty 5

## 2015-11-07 MED ORDER — DEXAMETHASONE 2 MG PO TABS
2.0000 mg | ORAL_TABLET | Freq: Two times a day (BID) | ORAL | Status: DC
  Administered 2015-11-07 – 2015-11-10 (×6): 2 mg via ORAL
  Filled 2015-11-07 (×7): qty 1

## 2015-11-07 MED ORDER — SODIUM CHLORIDE 0.9 % IJ SOLN
10.0000 mL | Freq: Two times a day (BID) | INTRAMUSCULAR | Status: DC
  Administered 2015-11-07 – 2015-11-09 (×3): 10 mL

## 2015-11-07 MED ORDER — INSULIN ASPART 100 UNIT/ML ~~LOC~~ SOLN
0.0000 [IU] | Freq: Three times a day (TID) | SUBCUTANEOUS | Status: DC
  Administered 2015-11-07: 15 [IU] via SUBCUTANEOUS
  Administered 2015-11-08: 11 [IU] via SUBCUTANEOUS
  Administered 2015-11-08 (×2): 7 [IU] via SUBCUTANEOUS
  Administered 2015-11-09: 4 [IU] via SUBCUTANEOUS
  Administered 2015-11-09 (×2): 3 [IU] via SUBCUTANEOUS

## 2015-11-07 NOTE — Plan of Care (Signed)
Problem: Activity: Goal: Risk for activity intolerance will decrease Outcome: Not Progressing Pt is to weak to get out of bed and is turned q 2 hours.

## 2015-11-07 NOTE — Progress Notes (Signed)
Mary [name] [id number] DOB: [birth year] DOA: 11/05/2015 PCP: Sandi Mariscal, MD  Brief narrative: 59 y/o ? Triple + Br Ca + Brain Mets foll [name] diag 03/2014 s/p XRT +n 5 Cycles Chemo Recurrent sz 10/06/15 and admitted Htn Chr hyponatremia IDDM Chr wound to R breast on Abx [doxycycline]  Patient admitted from home with ~ 1 month of progressive weakness as well as debility and failure to thrive. Poor appetite since last admission 10/06/15. On admission noted to be very sleepy and inconsistently responses which is not her typical Workup on admission concerning for new airspace process perihilar bibasilar L >R and admitted for concerns of aspiration pneumonia    Past medical history-As per Problem list Chart reviewed as below- Reviewed  Consultants:  Oncology  Palliative care  Procedures:  Chest x-ray  Antibiotics:  Aztreonam 11/8  Vancomycin 11/8   Subjective   Patient asleep   Objective    Interim History:  Telemetry: Sinus tachycardia    Objective: Filed Vitals:   11/07/15 0329 11/07/15 0401 11/07/15 0600 11/07/15 0800  BP:  91/45 110/56 104/64  Pulse:  78 77 79  Temp: 97.9 F (36.6 C)   97.7 F (36.5 C)  TempSrc: Oral   Oral  Resp:  11 18 11   Weight: 71.8 kg (158 lb 4.6 oz)     SpO2:  98% 99% 97%    Intake/Output Summary (Last 24 hours) at 11/07/15 0859 Last data filed at 11/07/15 0800  Gross per 24 hour  Intake   3650 ml  Output    955 ml  Net   2695 ml    Exam:  General: sleeping  No distress Did not exmaine as had just fallen asleep  Data Reviewed: Basic Metabolic Panel:  Recent Labs Lab 11/01/15 1328 11/05/15 1821 11/06/15 0229  NA 136 132* 135  K 3.5 4.7 4.2  CL 102 102 105  CO2 22 23 24   GLUCOSE 387* 277* 266*  BUN 19 16 15   CREATININE 0.4* 0.52 0.52  CALCIUM 8.4 8.5* 7.9*   Liver Function Tests:  Recent Labs Lab 11/01/15 1328 11/06/15 0229  AST 82* 16  ALT 272* 55*  ALKPHOS 272* 168*  BILITOT  1.70* 1.8*  PROT 5.3* 4.8*  ALBUMIN 2.0* 1.7*   No results for input(s): LIPASE, AMYLASE in the last 168 hours.  Recent Labs Lab 11/06/15 0959  AMMONIA 27   CBC:  Recent Labs Lab 11/01/15 1323 11/05/15 1821 11/06/15 0229  WBC 2.4 Corrected for nRBC* 4.4 3.0*  NEUTROABS 2.2 4.0  --   HGB 10.5* 9.7* 8.6*  HCT 30.2* 27.9* 24.5*  MCV 99 99.3 99.6  PLT 51* 80* 78*   Cardiac Enzymes: No results for input(s): CKTOTAL, CKMB, CKMBINDEX, TROPONINI in the last 168 hours. BNP: Invalid input(s): POCBNP CBG:  Recent Labs Lab 11/06/15 0133 11/06/15 0736 11/06/15 1142 11/06/15 2118 11/07/15 0757  GLUCAP 234* 221* 234* 255* 215*    Recent Results (from the past 240 hour(s))  MRSA PCR Screening     Status: Abnormal   Collection Time: 11/05/15 11:46 PM  Result Value Ref Range Status   MRSA by PCR INVALID RESULTS, SPECIMEN SENT FOR CULTURE (A) NEGATIVE Final    Comment: RESULT CALLED TO, READ BACK BY AND VERIFIED WITH: GARCIA,K RN 0225 [phone] [place],N        The GeneXpert MRSA Assay (FDA approved for NASAL specimens only), is one component of a comprehensive MRSA colonization surveillance program. It is not intended to diagnose MRSA  infection nor to guide or monitor treatment for MRSA infections.      Studies:              All Imaging reviewed and is as per above notation   Scheduled Meds: . antiseptic oral rinse  7 mL Mouth Rinse BID  . aztreonam  2 g Intravenous Q8H  . dexamethasone  2 mg Oral Q12H  . gabapentin  300 mg Oral TID  . insulin aspart  0-15 Units Subcutaneous TID WC  . insulin aspart  0-5 Units Subcutaneous QHS  . insulin glargine  20 Units Subcutaneous Q2200  . levETIRAcetam  500 mg Oral BID  . sodium hypochlorite   Topical BID  . tobramycin  2 drop Right Eye 4 times per day  . vancomycin  750 mg Intravenous Q12H   Continuous Infusions: . sodium chloride 125 mL/hr at 11/07/15 0214     Assessment/Plan:  1. Possible aspiration  pneumonia-transition to Aztreonam monotherapy. Another source for sepsis could possibly be right breast cancer which apparently has a discharge.  tachycardia is improved.  Cut back fluid rate from 125-->75 cc/hr on 11/10 2. 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  Delineating Goals with family continuing 3.  seizures secondary to metastatic breast cancer-continue Keppra 500 twice a day, continue Decadron 4 mg 3 times a day-->2 mg q 12 per Oncology 4. Insulin-dependent diabetic-continue Lantus 20 units subcutaneous, moderate sliding scale coverage 4 times a day before meals. Blood sugars ranging between 215 & 255.  monitor 5. Chronic hyponatremia-resolved with IV saline-no further workup-stable 6. Relative pancytopenia secondary to infection, cancer. Watch counts in am 7. Transaminitis-LFTs deranged on admission alkaline phosphatase 272 AST ALT 82/272, total bili 1.7-likely secondary to metastatic spread of cancer. Countsto be checked in am  Code Status: appears to be partial code. Given functional decline not sure how realistic this is.   Palliative care delineating goals  Family Communication:   Briefly d/w husband options and PCO but he has no q's and had a long discussion with both Oncology and Pallaitive care already today Disposition Plan: Inpatient at present   Verneita Griffes, MD  [facility] Pager [phone] 11/07/2015, 8:59 AM    LOS: 2 days

## 2015-11-07 NOTE — Consult Note (Signed)
Referral MD  Reason for Referral: Metastatic breast cancer-end-stage   Chief Complaint  Patient presents with  . Cancer  : Patient cannot give any history.  HPI: Mary [name] is well-known to me. She is a very nice 59 year old African-American female. She presented with metastatic breast cancer back in April 2015. She has been through all lines of chemotherapy and hormonal therapy.  She has had progressive brain metastases. She's had all the radiation therapy that she could tolerate for the brain metastases. She just finished her last course of radiation about 1 week ago.  She is on [place] and Aromasin. These are by the only agent that she could tolerate. Unfortunately, her CA 27.29 has continued to rise.  She is readmitted to the hospital. She is failing at home. She is getting weaker. She has not been eating. She's had some confusion. I'm sure that her brain metastases are the issue. Patient has a wound with the right breast where her tumor is. This is broken through the skin. Patient has issues with hyperglycemia. She has had poor control of blood sugars at home.  We checked a prealbumin on her. Yesterday, it was only 6.2.  Her other labs when she was admitted looked okay. She was somewhat anemic which she has always been. She's had some thrombocytopenia, which she has been noted to have. I just wonder if she does not have involvement with her bone marrow.  She's really not eating much. Again, her blood sugars have been quite high. A lot of this is because of her Decadron. I think we can probably cut back on her Decadron.  She has lability of the difficulty opening her right eye. When she did open up, she had some slight discharge. I will order some tobramycin eye drops.  It has been incredibly difficult to talk to her and her family about end-of-life issues. This is just been a very tough issue to get him to understand.  We are now asked to see her to try to help with her  management.  Currently, her performance status is ECOG 3-4.           Past Medical History  Diagnosis Date  . Arthritis   . Depression   . Fainting   . Headache   . Hypertension   . Migraines   . History of blood transfusion   . Gastric ulcer   . Breast cancer metastasized to multiple sites [facility]) 04/12/2014    10/30/15 radiation, chemotherapy  . Seizures ([place])   :  Past Surgical History  Procedure Laterality Date  . Abdominal hysterectomy  15 years go    partial  :   Current facility-administered medications:  .  0.9 %  sodium chloride infusion, , Intravenous, Continuous, Edwin Dada, MD, Last Rate: 125 mL/hr at 11/07/15 0214 .  antiseptic oral rinse (CPC / CETYLPYRIDINIUM CHLORIDE 0.05%) solution 7 mL, 7 mL, Mouth Rinse, BID, Nita Sells, MD .  aztreonam (AZACTAM) 2 g in dextrose 5 % 50 mL IVPB, 2 g, Intravenous, Q8H, Dorrene German, RPH, 2 g at 11/07/15 0007 .  dexamethasone (DECADRON) tablet 2 mg, 2 mg, Oral, Q12H, Volanda Napoleon, MD .  gabapentin (NEURONTIN) capsule 300 mg, 300 mg, Oral, TID, Edwin Dada, MD, 300 mg at 11/06/15 2251 .  insulin aspart (novoLOG) injection 0-15 Units, 0-15 Units, Subcutaneous, TID WC, Edwin Dada, MD, 11 Units at 11/06/15 1605 .  insulin aspart (novoLOG) injection 0-5 Units, 0-5 Units, Subcutaneous, QHS, Harrell Gave  Shirleen Schirmer, MD, 3 Units at 11/06/15 2200 .  insulin glargine (LANTUS) injection 20 Units, 20 Units, Subcutaneous, Q2200, Edwin Dada, MD, 20 Units at 11/06/15 2255 .  levETIRAcetam (KEPPRA) tablet 500 mg, 500 mg, Oral, BID, Edwin Dada, MD, 500 mg at 11/06/15 2251 .  LORazepam (ATIVAN) injection 0.5 mg, 0.5 mg, Intravenous, Q4H PRN, Edwin Dada, MD .  morphine 2 MG/ML injection 2 mg, 2 mg, Intravenous, Q4H PRN, Nita Sells, MD, 2 mg at 11/06/15 2256 .  ondansetron (ZOFRAN) tablet 4 mg, 4 mg, Oral, Q6H PRN **OR** ondansetron (ZOFRAN) injection 4 mg,  4 mg, Intravenous, Q6H PRN, Edwin Dada, MD .  sodium hypochlorite (DAKIN'S 1/4 STRENGTH) topical solution, , Topical, BID, Nita Sells, MD, 1 application at 0000000 2255 .  tobramycin (TOBREX) 0.3 % ophthalmic solution 2 drop, 2 drop, Right Eye, 4 times per day, Volanda Napoleon, MD .  vancomycin (VANCOCIN) IVPB 750 mg/150 ml premix, 750 mg, Intravenous, Q12H, Randa Spike, RPH, 750 mg at 11/06/15 2247  Facility-Administered Medications Ordered in Other Encounters:  .  sodium chloride 0.9 % injection 10 mL, 10 mL, Intravenous, PRN, Volanda Napoleon, MD, 10 mL at 11/01/15 1552:  . antiseptic oral rinse  7 mL Mouth Rinse BID  . aztreonam  2 g Intravenous Q8H  . dexamethasone  2 mg Oral Q12H  . gabapentin  300 mg Oral TID  . insulin aspart  0-15 Units Subcutaneous TID WC  . insulin aspart  0-5 Units Subcutaneous QHS  . insulin glargine  20 Units Subcutaneous Q2200  . levETIRAcetam  500 mg Oral BID  . sodium hypochlorite   Topical BID  . tobramycin  2 drop Right Eye 4 times per day  . vancomycin  750 mg Intravenous Q12H  :  Allergies  Allergen Reactions  . Hydrocodone     "makes me crawl on the floor"  . Penicillins Swelling    Per pt's sister, swelling of throat  . Aspirin Other (See Comments)    Due to ulcers  :  Family History  Problem Relation Age of Onset  . Arthritis Other   . Hypertension Mother   . Hypertension Other   . Heart disease Other   . Stroke Father   . Cancer Sister     leukemia  :  Social History   Social History  . Marital Status: Married    Spouse Name: N/A  . Number of Children: 0  . Years of Education: N/A   Occupational History  .      no work for 4 to 5 years   Social History Main Topics  . Smoking status: Never Smoker   . Smokeless tobacco: Never Used     Comment: never used tobacco  . Alcohol Use: No  . Drug Use: No  . Sexual Activity: Not Currently   Other Topics Concern  . Not on file   Social History  Narrative  :  Pertinent items are noted in HPI.  Exam: Patient Vitals for the past 24 hrs:  BP Temp Temp src Pulse Resp SpO2 Weight  11/07/15 0600 (!) 110/56 mmHg - - 77 18 99 % -  11/07/15 0401 (!) 91/45 mmHg - - 78 11 98 % -  11/07/15 0329 - 97.9 F (36.6 C) Oral - - - 158 lb 4.6 oz (71.8 kg)  11/07/15 0300 127/83 mmHg - - 77 15 96 % -  11/07/15 0200 104/73 mmHg - - 77 13 97 % -  11/07/15 0100 100/65 mmHg - - 81 12 96 % -  11/07/15 0000 109/83 mmHg - - 77 12 100 % -  11/06/15 2312 - 97.6 F (36.4 C) Oral - - - -  11/06/15 2300 116/76 mmHg - - 77 12 99 % -  11/06/15 2200 116/78 mmHg - - 76 11 98 % -  11/06/15 2100 106/74 mmHg - - 88 19 95 % -  11/06/15 2000 (!) 107/55 mmHg - - 84 11 96 % -  11/06/15 1952 - 98.4 F (36.9 C) Oral - - - -  11/06/15 1800 (!) 105/59 mmHg - - 81 12 100 % -  11/06/15 1700 (!) 91/41 mmHg - - - 12 - -  11/06/15 1600 (!) 96/59 mmHg 97.6 F (36.4 C) Oral - (!) 9 - -  11/06/15 1500 - - - - 14 - -  11/06/15 1400 - - - (!) 37 12 100 % -  11/06/15 1300 102/66 mmHg - - - (!) 29 - -  11/06/15 1200 131/80 mmHg - - - 15 - -  11/06/15 1100 123/86 mmHg - - (!) 45 15 (!) 85 % -  11/06/15 1000 112/79 mmHg - - 83 15 100 % -  11/06/15 0900 103/74 mmHg - - 83 19 93 % -  11/06/15 0800 117/81 mmHg 98.4 F (36.9 C) Oral 90 14 97 % -    chronically ill-appearing African-American female. She really is not able to answer questions. She may say single word answers. She has no adenopathy in her neck. Her left eye has a reactive pupil. Her right eye has some slight discharge. The pupil does react. Her lungs are clear. Cardiac exam regular rate and rhythm with no murmurs, rubs or bruits. Abdomen is soft. Bowel sounds are decreased. There is no abdominal distention. Breasts exam shows a dressing on the lateral aspect of the right breast. Extremities shows no clubbing, cyanosis or edema. Neurological exam shows no focal neurological deficit. She has some slight lethargy.     Recent Labs  11/05/15 1821 11/06/15 0229  WBC 4.4 3.0*  HGB 9.7* 8.6*  HCT 27.9* 24.5*  PLT 80* 78*    Recent Labs  11/05/15 1821 11/06/15 0229  NA 132* 135  K 4.7 4.2  CL 102 105  CO2 23 24  GLUCOSE 277* 266*  BUN 16 15  CREATININE 0.52 0.52  CALCIUM 8.5* 7.9*    Blood smear review:  None  Pathology: None     Assessment and Plan:  Mary [name] is a 59 year old Afro-American female. She has progressive metastatic breast cancer. She was diagnosed back in April 2015.  As I have try to tell the family, we have set have no options for her at this point. She has been through all the chemotherapy that she could tolerate. She's been through antiestrogen therapy. Her CA 27.29 has been rising.  As I told the family, she will pass away from this disease because of her brain metastases. She has had all the radiation that she could handle. Radiation oncology has on a fantastic job in trying to help her and have given her as much radiation therapy as possible.  I talked to her husband today. He is having a very difficult time with this. I brought up the role of hospice. I really think hospice will be incredibly helpful. However, he is not able to make that decision.  He does not want me talking to her about end-of-life care and prognosis. I told him  that I do not think that she will make it to December. If she does, I would be surprised. Her prealbumin is very low. I think this is incredibly prognostic.  I really understand the difficulties that he is having try to deal with this. He has done a very good job with her. I told him that she has done all that she could possibly do. He is done all that he can do. We have always done the right thing for her. We avoids being honest with her and him.  I told him that I would hate to see her be kept alive on machines. This will happen if a decision cannot be made regarding end-of-life care. I told him that I just want her to have respect and  dignity. She truly deserves this.  I think she has shown [place] that she is declining quickly. She really has no quality of life now.  It certainly would not be a bad idea to try to get palliative care involved. I'm not sure how much input if they can provide but it might help to have another doctor be able to confirm to the husband what I have been telling him.  I really feel bad for him. He has been very dedicated to taking care of her. He does have a strong faith. I told him that we have to make sure that she has respect and dignity. We know where "she is going". He understands that also. We know that "she will win" because of where she is going. He also understands this.  I'm not sure if there are some issues with other family members. I know that her sister having a very hard time with this. She has been calling the office quite a bit and I think she does not want to except what is happening to Mary [name].  At this point, I just think that we have to focus on her comfort. May be controlling her blood sugars will help a little bit.  I suspect that she probably has bone marrow involvement by her tumor. Her blood counts have it on the low side. She has not been on any chemotherapy although the Ibrance that she was on can sometimes lead to pancytopenia.  I spent about 45 minutes or so with Mary [name] and her husband.  I will certainly pray for them.  I appreciate all the great care that she is getting from everybody down in the ICU.   Lum Keas  2 Timothy 4:17-18

## 2015-11-07 NOTE — Consult Note (Signed)
Consultation Note Date: 11/07/2015   Patient Name: Mary [name]  DOB: 11-18-1956  MRN: [id number]  Age / Sex: 59 y.o., female  PCP: Sandi Mariscal, MD Referring Physician: Nita Sells, MD  Reason for Consultation: Establishing goals of care, Non pain symptom management, Pain control and Psychosocial/spiritual support    Clinical Assessment/Narrative:  Mary [name] is a 59 y.o. female with a past medical history significant for metastatic breast cancer c/b numerous hemorrhagic brain mets with seizures last month as well as a suppurative non-healing breast mass who presents with altered mental status.   Since her admission for seizures 1 month ago the patient has been recuperating at home with her husband. Continued physical, functional and cognitive decline.  She is not able to walk much anymore and mostly sits on the couch, but is conversant and able to eat most days.   In the ED, the patient was tachycardic but afebrile and breathing normally on ambient air. Her urinalysis showed no signs of infection, but a chest x-ray had new bilateral asymmetric airspace opacities. She was started on vancomycin and aztreonam given her medical comorbidities, recent hospitalization and a reported penicillin allergy.   Family faced with advanced directive decisions and anticipatory care needs   Consult is for review of medical treatment options, clarification of goals of care and end of life issues, disposition and options, and symptom recommendation.  This NP Wadie Lessen reviewed medical records, received report from team, assessed the patient and then meet at the patient's husband and sister by telephone  to discuss diagnosis, prognosis, GOC, EOL wishes disposition and options.   A detailed discussion was had today regarding advanced directives.  Concepts specific to code status, artifical feeding and hydration, The  difference between a aggressive medical intervention path  and a palliative comfort care path for this patient at this time was had.  Values and goals of care important to patient and family were attempted to be elicited.  Concept of Hospice and Palliative Care were discussed.    Natural trajectory and expectations at EOL were discussed.  Questions and concerns addressed.   Family encouraged to call with questions or concerns.  PMT will continue to support holistically.  Both the husband and sister are very reluctant to talk about EOL for this patient.  They continually revert to the hope of a miracle and God's ability to change things.  Today they are clearly not ready to make any shift in care and want to continue with current treatment plan.       Contacts/Participants in Discussion: Shelley Cocke and Herbert Deaner    Relationship to Patient husband and sister  [place]  - continue current medical treatment plan  -continue to support patient and family navigate the current medical situation and the  spiritual and emotional  Dimensions of facing  mortality and limitations of medical intervetnions to prolong life.  -PMT will continue to support holistically    Code Status/Advance Care Planning: Limited code    Code Status Orders        Start     Ordered   11/05/15 2310  Limited resuscitation (code)   Continuous    Question Answer Comment  In the event of cardiac or respiratory ARREST: Initiate Code Blue, Call Rapid Response Yes   In the event of cardiac or respiratory ARREST: Perform CPR Yes   In the event of cardiac or respiratory ARREST: Perform Intubation/Mechanical Ventilation No   In the event of cardiac or  respiratory ARREST: Use NIPPV/BiPAp only if indicated Yes   In the event of cardiac or respiratory ARREST: Administer ACLS medications if indicated Yes   In the event of cardiac or respiratory ARREST: Perform Defibrillation or Cardioversion if  indicated Yes      11/05/15 2309      Other Directives:None   Palliative Prophylaxis:   Aspiration, Bowel Regimen, Delirium Protocol, Frequent Pain Assessment, Oral Care and Turn Reposition   Psycho-social/Spiritual:  Support System: Strong Desire for further Chaplaincy support:yes Additional Recommendations: Education on Hospice  Prognosis:  Weeks to months  Discharge Planning: Pending outcomes, family is expressing concerns that they cannot care for her at home   Chief Complaint/ Primary Diagnoses: Present on Admission:  . Breast cancer metastasized to multiple sites [facility]) . Sepsis ([place]) . Hypertension . Protein-calorie malnutrition, severe ([place]) . Wound of right breast . over 75 brain metastases, several showing hemorrhage . HCAP (healthcare-associated pneumonia) . Anemia, unspecified . Thrombocytopenia ([place])  I have reviewed the medical record, interviewed the patient and family, and examined the patient. The following aspects are pertinent.  Past Medical History  Diagnosis Date  . Arthritis   . Depression   . Fainting   . Headache   . Hypertension   . Migraines   . History of blood transfusion   . Gastric ulcer   . Breast cancer metastasized to multiple sites [facility]) 04/12/2014    10/30/15 radiation, chemotherapy  . Seizures [facility])    Social History   Social History  . Marital Status: Married    Spouse Name: N/A  . Number of Children: 0  . Years of Education: N/A   Occupational History  .      no work for 4 to 5 years   Social History Main Topics  . Smoking status: Never Smoker   . Smokeless tobacco: Never Used     Comment: never used tobacco  . Alcohol Use: No  . Drug Use: No  . Sexual Activity: Not Currently   Other Topics Concern  . None   Social History Narrative   Family History  Problem Relation Age of Onset  . Arthritis Other   . Hypertension Mother   . Hypertension Other   . Heart disease Other   . Stroke Father   . Cancer  Sister     leukemia   Scheduled Meds: . antiseptic oral rinse  7 mL Mouth Rinse BID  . aztreonam  2 g Intravenous Q8H  . dexamethasone  2 mg Oral Q12H  . gabapentin  300 mg Oral TID  . insulin aspart  0-15 Units Subcutaneous TID WC  . insulin aspart  0-5 Units Subcutaneous QHS  . insulin glargine  20 Units Subcutaneous Q2200  . levETIRAcetam  500 mg Oral BID  . sodium hypochlorite   Topical BID  . tobramycin  2 drop Right Eye 4 times per day  . vancomycin  750 mg Intravenous Q12H   Continuous Infusions: . sodium chloride 125 mL/hr at 11/07/15 0214   PRN Meds:.LORazepam, morphine injection, ondansetron **OR** ondansetron (ZOFRAN) IV Medications Prior to Admission:  Prior to Admission medications   Medication Sig Start Date End Date Taking? Authorizing Provider  ACCU-CHEK AVIVA PLUS test strip USE TO CHECK BLOOD GLUCOSE 4 TIMES DAILY AFTER MEALS AND AT BEDTIME 10/10/15  Yes Historical Provider, MD  ACCU-CHEK SOFTCLIX LANCETS lancets USE TO CHECK BLOOD GLUCOSE 4 TIMES DAILY 10/10/15  Yes Historical Provider, MD  B-D ULTRAFINE III SHORT PEN 31G X 8  MM MISC USE WITH LANTUS AND NOVOLOG 10/10/15  Yes Historical Provider, MD  Blood Glucose Monitoring Suppl (ACCU-CHEK AVIVA PLUS) W/DEVICE KIT USE TO CHECK BLOOD GLUCOSE 4 TIMES DAILY 10/10/15  Yes Historical Provider, MD  dexamethasone (DECADRON) 4 MG tablet Take 1 tablet (4 mg total) by mouth 3 (three) times daily. Patient taking differently: Take 4 mg by mouth 2 (two) times daily.  10/09/15  Yes Shanker Kristeen Mans, MD  exemestane (AROMASIN) 25 MG tablet Take 1 tablet (25 mg total) by mouth daily after breakfast. 11/01/15  Yes Eliezer Bottom, NP  gabapentin (NEURONTIN) 300 MG capsule Take 1 capsule (300 mg total) by mouth 3 (three) times daily. 08/28/15  Yes Volanda Napoleon, MD  glucose monitoring kit (FREESTYLE) monitoring kit 1 each by Does not apply route 4 (four) times daily - after meals and at bedtime. 1 month Diabetic Testing Supplies  for QAC-QHS accuchecks. 10/09/15  Yes Shanker Kristeen Mans, MD  insulin aspart (NOVOLOG FLEXPEN) 100 UNIT/ML FlexPen 0-9 Units, Subcutaneous, 3 times daily with meals CBG < 70: call MD CBG 70 - 120: 0 units CBG 121 - 150: 1 unit CBG 151 - 200: 2 units CBG 201 - 250: 3 units CBG 251 - 300: 5 units CBG 301 - 350: 7 units CBG 351 - 400: 9 units CBG > 400: call MD  Okay to substitute with different band at equivalent dosing 10/09/15  Yes Shanker Kristeen Mans, MD  Insulin Glargine (LANTUS SOLOSTAR) 100 UNIT/ML Solostar Pen Inject 20 Units into the skin daily at 10 pm. Okay to substitute with different band at equivalent dosing 10/09/15  Yes Shanker Kristeen Mans, MD  Insulin Pen Needle 32G X 8 MM MISC Please provide 2 month supply. Okay to substitute with different brand 10/09/15  Yes Shanker Kristeen Mans, MD  levETIRAcetam (KEPPRA) 500 MG tablet Take 1 tablet (500 mg total) by mouth 2 (two) times daily. 10/30/15  Yes Penni Bombard, MD  nystatin (MYCOSTATIN) 100000 UNIT/ML suspension Take 5 mLs by mouth 4 (four) times daily as needed (mouth pain).  10/18/15  Yes Historical Provider, MD  ondansetron (ZOFRAN) 8 MG tablet Take 1 tablet (8 mg total) by mouth 2 (two) times daily. Start the day after chemo for 3 days. Then take as needed for nausea or vomiting. 10/09/15  Yes Shanker Kristeen Mans, MD  oxyCODONE (OXY IR/ROXICODONE) 5 MG immediate release tablet Take 1-3 tablets (5-15 mg total) by mouth every 6 (six) hours as needed for severe pain. Take 1-2 if needed for pain due to cancer. 10/09/15  Yes Shanker Kristeen Mans, MD  palbociclib [facility]) 125 MG capsule Take whole with food every day for 21 days and then off for 7 days 11/01/15  Yes Eliezer Bottom, NP  potassium chloride SA (K-DUR,KLOR-CON) 20 MEQ tablet Take 1 tablet (20 mEq total) by mouth daily. 10/09/15  Yes Shanker Kristeen Mans, MD  pantoprazole (PROTONIX) 40 MG tablet Take 1 tablet (40 mg total) by mouth 2 (two) times daily. Patient not taking: Reported  on 11/05/2015 10/09/15   Jonetta Osgood, MD   Allergies  Allergen Reactions  . Hydrocodone     "makes me crawl on the floor"  . Penicillins Swelling    Per pt's sister, swelling of throat  . Aspirin Other (See Comments)    Due to ulcers    Review of Systems  Unable to perform ROS   Physical Exam  Constitutional: Vital signs are normal. She appears well-developed. She appears lethargic.  HENT:  Head: Normocephalic.  Mouth/Throat: No oropharyngeal exudate.  Cardiovascular: Normal rate, regular rhythm, normal heart sounds and normal pulses.   Respiratory: Effort normal and breath sounds normal.  GI: Soft. Normal appearance and bowel sounds are normal.  Neurological: She appears lethargic.  Skin: Skin is warm and dry.  Noted breast wound    Vital Signs: BP 104/64 mmHg  Pulse 79  Temp(Src) 97.7 F (36.5 C) (Oral)  Resp 11  Ht _0  (1.651 m)  Wt 71.8 kg (158 lb 4.6 oz)  BMI 26.34 kg/m2  SpO2 97%  SpO2: SpO2: 97 % O2 Device:SpO2: 97 % O2 Flow Rate: .   IO: Intake/output summary:  Intake/Output Summary (Last 24 hours) at 11/07/15 0954 Last data filed at 11/07/15 0900  Gross per 24 hour  Intake   3900 ml  Output    955 ml  Net   2945 ml    LBM: Last BM Date: 11/05/15 Baseline Weight: Weight: 71.8 kg (158 lb 4.6 oz) Most recent weight: Weight: 71.8 kg (158 lb 4.6 oz)      Palliative Assessment/Data:  Flowsheet Rows        Most Recent Value   Intake Tab    Referral Department  Hospitalist   Unit at Time of Referral  ICU   Palliative Care Primary Diagnosis  Cancer   Date Notified  11/05/15   Palliative Care Type  New Palliative care   Reason for referral  Clarify Goals of Care   Date of Admission  11/05/15   # of days IP prior to Palliative referral  0   Clinical Assessment    Psychosocial & Spiritual Assessment    Palliative Care Outcomes       Additional Data Reviewed:  CBC:    Component Value Date/Time   WBC 3.0* 11/06/2015 0229   WBC 2.4  Corrected for nRBC* 11/01/2015 1323   HGB 8.6* 11/06/2015 0229   HGB 10.5* 11/01/2015 1323   HCT 24.5* 11/06/2015 0229   HCT 30.2* 11/01/2015 1323   PLT 78* 11/06/2015 0229   PLT 51* 11/01/2015 1323   MCV 99.6 11/06/2015 0229   MCV 99 11/01/2015 1323   NEUTROABS 4.0 11/05/2015 1821   NEUTROABS 2.2 11/01/2015 1323   LYMPHSABS 0.3* 11/05/2015 1821   LYMPHSABS 0.3* 11/01/2015 1323   MONOABS 0.1 11/05/2015 1821   EOSABS 0.0 11/05/2015 1821   EOSABS 0.0 11/01/2015 1323   BASOSABS 0.0 11/05/2015 1821   BASOSABS 0.0 11/01/2015 1323   Comprehensive Metabolic Panel:    Component Value Date/Time   NA 135 11/06/2015 0229   NA 136 11/01/2015 1328   K 4.2 11/06/2015 0229   K 3.5 11/01/2015 1328   CL 105 11/06/2015 0229   CL 102 11/01/2015 1328   CO2 24 11/06/2015 0229   CO2 22 11/01/2015 1328   BUN 15 11/06/2015 0229   BUN 19 11/01/2015 1328   CREATININE 0.52 11/06/2015 0229   CREATININE 0.4* 11/01/2015 1328   GLUCOSE 266* 11/06/2015 0229   GLUCOSE 387* 11/01/2015 1328   CALCIUM 7.9* 11/06/2015 0229   CALCIUM 8.4 11/01/2015 1328   AST 16 11/06/2015 0229   AST 82* 11/01/2015 1328   ALT 55* 11/06/2015 0229   ALT 272* 11/01/2015 1328   ALKPHOS 168* 11/06/2015 0229   ALKPHOS 272* 11/01/2015 1328   BILITOT 1.8* 11/06/2015 0229   BILITOT 1.70* 11/01/2015 1328   PROT 4.8* 11/06/2015 0229   PROT 5.3* 11/01/2015 1328   ALBUMIN 1.7* 11/06/2015 0229  ALBUMIN 2.0* 11/01/2015 1328     Time In: 0815 Time Out: 0945 Time Total: 90 min Greater than 50%  of this time was spent counseling and coordinating care related to the above assessment and plan.  Discussed with Dr Verlon Au  Signed by: Wadie Lessen, NP  Knox Royalty, NP  11/07/2015, 9:54 AM  Please contact Palliative Medicine Team phone at [phone] for questions and concerns.

## 2015-11-08 ENCOUNTER — Inpatient Hospital Stay (HOSPITAL_COMMUNITY): Payer: Medicaid Other

## 2015-11-08 LAB — BASIC METABOLIC PANEL
ANION GAP: 5 (ref 5–15)
BUN: 18 mg/dL (ref 6–20)
CALCIUM: 8 mg/dL — AB (ref 8.9–10.3)
CO2: 23 mmol/L (ref 22–32)
Chloride: 107 mmol/L (ref 101–111)
Creatinine, Ser: 0.5 mg/dL (ref 0.44–1.00)
Glucose, Bld: 256 mg/dL — ABNORMAL HIGH (ref 65–99)
POTASSIUM: 4 mmol/L (ref 3.5–5.1)
Sodium: 135 mmol/L (ref 135–145)

## 2015-11-08 LAB — GLUCOSE, CAPILLARY
GLUCOSE-CAPILLARY: 277 mg/dL — AB (ref 65–99)
GLUCOSE-CAPILLARY: 288 mg/dL — AB (ref 65–99)
Glucose-Capillary: 232 mg/dL — ABNORMAL HIGH (ref 65–99)
Glucose-Capillary: 299 mg/dL — ABNORMAL HIGH (ref 65–99)

## 2015-11-08 LAB — VANCOMYCIN, TROUGH: VANCOMYCIN TR: 4 ug/mL — AB (ref 10.0–20.0)

## 2015-11-08 MED ORDER — FUROSEMIDE 10 MG/ML IJ SOLN: 40.0000 mg | Freq: Once | INTRAMUSCULAR | Status: DC

## 2015-11-08 MED ORDER — CLINDAMYCIN HCL 300 MG PO CAPS
300.0000 mg | ORAL_CAPSULE | Freq: Three times a day (TID) | ORAL | Status: DC
  Administered 2015-11-08 – 2015-11-10 (×7): 300 mg via ORAL
  Filled 2015-11-08 (×10): qty 1

## 2015-11-08 MED ORDER — SENNOSIDES-DOCUSATE SODIUM 8.6-50 MG PO TABS
1.0000 | ORAL_TABLET | Freq: Two times a day (BID) | ORAL | Status: DC
  Administered 2015-11-08 – 2015-11-10 (×5): 1 via ORAL
  Filled 2015-11-08 (×6): qty 1

## 2015-11-08 MED ORDER — INSULIN GLARGINE 100 UNIT/ML ~~LOC~~ SOLN
28.0000 [IU] | Freq: Every day | SUBCUTANEOUS | Status: DC
  Administered 2015-11-08: 28 [IU] via SUBCUTANEOUS
  Filled 2015-11-08: qty 0.28

## 2015-11-08 MED ORDER — NYSTATIN 100000 UNIT/ML MT SUSP
5.0000 mL | Freq: Four times a day (QID) | OROMUCOSAL | Status: DC
  Administered 2015-11-08 – 2015-11-10 (×7): 500000 [IU] via ORAL
  Filled 2015-11-08 (×10): qty 5

## 2015-11-08 NOTE — Progress Notes (Signed)
Daily Progress Note   Patient Name: Mary [name]       Date: 11/08/2015 DOB: March 29, 1956  Age: 59 y.o. MRN#: [id number] Attending Physician: Mary Sells, MD Primary Care Physician: Mary Mariscal, MD Admit Date: 11/05/2015  Reason for Consultation/Follow-up: Establishing goals of care and Psychosocial/spiritual support  Subjective:     -continued conversation with husband regarding Mary [name]'s medical situation, husband tells me he "doesn't want to talk about things today"  -Hard Choices booklet left for review  -will continue to support holistically as family allows for interaction   Length of Stay: 3 days  Current Medications: Scheduled Meds:  . antiseptic oral rinse  7 mL Mouth Rinse BID  . clindamycin  300 mg Oral 3 times per day  . dexamethasone  2 mg Oral Q12H  . feeding supplement (ENSURE ENLIVE)  237 mL Oral BID BM  . gabapentin  300 mg Oral TID  . insulin aspart  0-20 Units Subcutaneous TID WC  . insulin aspart  0-5 Units Subcutaneous QHS  . insulin glargine  28 Units Subcutaneous Q2200  . levETIRAcetam  500 mg Oral BID  . senna-docusate  1 tablet Oral BID  . sodium chloride  10-40 mL Intracatheter Q12H  . sodium hypochlorite   Topical BID  . tobramycin  2 drop Right Eye 4 times per day  . vitamin A & D        Continuous Infusions: . sodium chloride 100 mL/hr at 11/08/15 0841    PRN Meds: LORazepam, morphine injection, ondansetron **OR** ondansetron (ZOFRAN) IV, sodium chloride  Palliative Performance Scale: 20 % at best     Vital Signs: BP 98/68 mmHg  Pulse 109  Temp(Src) 98.5 F (36.9 C) (Oral)  Resp 14  Ht 5\' 5"  (1.651 m)  Wt 71.8 kg (158 lb 4.6 oz)  BMI 26.34 kg/m2  SpO2 100% SpO2: SpO2: 100 % O2 Device: O2 Device: Not Delivered O2 Flow Rate:    Intake/output summary:  Intake/Output Summary (Last 24 hours) at 11/08/15 1141 Last data filed at 11/08/15 1100  Gross per 24 hour  Intake 1957.92 ml  Output   1495 ml  Net 462.92 ml    LBM: Last BM Date: 11/05/15 Baseline Weight: Weight: 71.8 kg (158 lb 4.6 oz) Most recent weight: Weight: 71.8 kg (158 lb 4.6 oz)  Physical Exam:  Constitutional: Vital signs are normal. She appears well-developed. She appears lethargic.  Head: Normocephalic.  Mouth/Throat: No oropharyngeal exudate.  Cardiovascular: Normal rate, regular rhythm, normal heart sounds and normal pulses.  Respiratory: Effort normal and breath sounds normal.  GI: Soft. Normal appearance and bowel sounds are normal.  Neurological: She appears lethargic.  Skin: Skin is warm and dry.  Noted breast wound  Additional Data Reviewed: Recent Labs     11/05/15  1821  11/06/15  0229  11/08/15  0839  WBC  4.4  3.0*   --   HGB  9.7*  8.6*   --   PLT  80*  78*   --   NA  132*  135  135  BUN  16  15  18   CREATININE  0.52  0.52  0.50     Problem List:  Patient Active Problem List   Diagnosis Date Noted  . Palliative care encounter 11/07/2015  . DNR (do not resuscitate) discussion 11/07/2015  . Altered mental status   . Anemia, unspecified 11/06/2015  . Thrombocytopenia ([place]) 11/06/2015  . HCAP (healthcare-associated pneumonia) 11/05/2015  . 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  Metastatic cancer to brain ([place])   . Hyperglycemia 10/06/2015  . Seizure ([place]) 10/06/2015  . Status epilepticus ([place]) 10/06/2015  . Wound infection ([place]) 10/06/2015  . over 75 brain metastases, several showing hemorrhage 03/04/2015  . Wound of right breast 08/06/2014  . Breast cancer metastasized to multiple sites ([place]) 04/12/2014  . Protein-calorie malnutrition, severe ([place]) 04/06/2014  . Fever, unspecified 04/05/2014  . Right hip pain 04/05/2014  . Abdominal tenderness, generalized 04/05/2014  . Back pain 04/05/2014  . Cellulitis 04/05/2014  . Migraine headache 04/05/2014  . Elevated liver enzymes 04/05/2014  . Sepsis ([place]) 04/05/2014  . Preventative health care 07/04/2013  . Anxiety state, unspecified  07/04/2013  . Arthritis   . Depression   . Hypertension   . Migraines      Palliative Care Assessment & Plan    Code Status:  Limited code  Goals of Care:  Family remains hopeful for a miracle.  Open to all offered and available medical interventions to prolong life.    Prognosis: < 2 weeks  5. Discharge Planning: Pending     Thank you for allowing the Palliative Medicine Team to assist in the care of this patient.   Time In: 1140 Time Out: 1200 Total Time 20 min Prolonged Time Billed  no     Greater than 50%  of this time was spent counseling and coordinating care related to the above assessment and plan.     Mary Royalty, NP  11/08/2015, 11:41 AM  Please contact Palliative Medicine Team phone at [phone] for questions and concerns.

## 2015-11-08 NOTE — Progress Notes (Signed)
Initial Nutrition Assessment  DOCUMENTATION CODES:   Not applicable  INTERVENTION:  Magic cup TID with meals, each supplement provides 290 kcal and 9 grams of protein  NUTRITION DIAGNOSIS:   Increased nutrient needs related to chronic illness as evidenced by estimated needs.  GOAL:   Patient will meet greater than or equal to 90% of their needs   MONITOR:   PO intake, Labs, I & O's, Skin, Supplement acceptance  REASON FOR ASSESSMENT:   Malnutrition Screening Tool    ASSESSMENT:   Mary [name] is a 59 y.o. female with a past medical history significant for metastatic breast cancer c/b numerous hemorrhagic brain mets with seizures last month as well as a suppurative non-healing breast mass who presents with altered mental status.  Spoke with pt and husband at bedside. Most information is gathered from husband, pt was somewhat responsive.  Pt appeared tired, but was eating during visit. Tray from breakfast was still in room, patient ate ~80% of meal.  Husband reported pt still has a good appetite, eats well at home. This is corroborated by pt's wt history which has been stable, and NFPA which shows no signs of muscle wasting or fat loss.  Pt currently has HCAP & Sepsis. Will provide Magic Cup TID to improve overall calorie intake. Pt has been receiving Ensure but has very little interest in it.  ALT 55, Alkaline Phosphatase 168, Albumin 1.7 - indicative of sepsis and inflammatory state.  Medications reviewed  Diet Order:  Diet Carb Modified Fluid consistency:: Thin; Room service appropriate?: Yes  Skin:  Wound (see comment) (R U Breast wound)  Last BM:  11/05/2015  Height:   Ht Readings from Last 1 Encounters:  11/07/15 5\' 5"  (1.651 m)    Weight:   Wt Readings from Last 1 Encounters:  11/08/15 158 lb 4.6 oz (71.8 kg)    Ideal Body Weight:  56.81 kg  BMI:  Body mass index is 26.34 kg/(m^2).  Estimated Nutritional Needs:   Kcal:  2160 -  2500  Protein:  85-108 grams  Fluid:  >/= 2100 ml  EDUCATION NEEDS:   No education needs identified at this time  Satira Anis. Jillyan Plitt, MS, RD LDN After Hours/Weekend Pager [phone]

## 2015-11-08 NOTE — Progress Notes (Signed)
Mary [name] appears to be pretty comfortable this morning. I appreciate everybody's help yesterday. I know that her husband and sister spoke with palliative care.  I did speak with [name] yesterday. I don't know if some palliative radiation might help this tumor break through on the right breast. Radiation might help with respect to infection and bleeding. This might offer her a little better quality of life. If he feels he might help doubt be great. If he does not think that radiation could be of benefit, then I would have no problems with this.  Her husband says that she ate okay yesterday. Her blood sugars on the higher side.  She is not hurting.  She is opening her right eyelid bit better. We have her on some antibiotic eyedrops for this.  There is no cough or shortness of breath. She's had no nausea or vomiting. She's had no diarrhea. There's not been any leg swelling.  No labs were done today. I will order some for tomorrow.  On her physical exam, her blood pressure is 88/39. She is afebrile. Her pulse is 75. Her oral exam shows no mucositis. There is no adenopathy in the neck. Lungs show some decreased at the bases. Cardiac exam regular rate and rhythm with no murmurs, rubs or bruits. Abdomen is soft. Bowel sounds are somewhat decreased. There is no guarding or rebound tenderness. There is no palpable liver or spleen tip. Neurological exam shows no focal neurological deficit.  Again, I really think that our goal should be comfort care. There is no other options for therapy. Maybe palliative radiation to the right breast might help minimize risk of bleeding and infection from tumor breaking through the skin.  It is still very hard for the husband to acknowledge that she will not be with [place] much longer. I think her prealbumin being so low that is very prognostic.  I just want to make sure that we focus on her comfort, respect and dignity.  I wonder if she would not be a good candidate  for [employer]?? I think this would really be a good option for her. However, I'm not sure her family would except the implication of her going to [facility].  We will keep following along. We had a good prayer session this morning. Her faith does remain strong.  Mary [name]  DEUTERONOMY 31:6

## 2015-11-08 NOTE — Progress Notes (Signed)
[facility]  Patient Status: Active (receiving services up to time of hospitalization)  [facility] is providing the following services: RN  If patient discharges after hours, please call [phone].   Lurlean Leyden 11/08/2015, 9:44 AM

## 2015-11-08 NOTE — Progress Notes (Signed)
AMOR CAPRETTA [id number] DOB: March 09, 1956 DOA: 11/05/2015 PCP: Sandi Mariscal, MD  Brief narrative: 59 y/o ? Triple + Br Ca + Brain Mets foll [name] diag 03/2014 s/p XRT +n 5 Cycles Chemo Recurrent sz 10/06/15 and admitted Htn Chr hyponatremia IDDM Chr wound to R breast on Abx [doxycycline]  Patient admitted from home with ~ 1 month of progressive weakness as well as debility and failure to thrive. Poor appetite since last admission 10/06/15. On admission noted to be very sleepy and inconsistently responses which is not her typical Workup on admission concerning for new airspace process perihilar bibasilar L >R and admitted for concerns of aspiration pneumonia    Past medical history-As per Problem list Chart reviewed as below- Reviewed  Consultants:  Oncology  Palliative care  Procedures:  Chest x-ray  Antibiotics:  Aztreonam 11/8  Vancomycin 11/8   Subjective   Patient doing fair. No n V Cp No recent stool-husband concerned about it Note oncology recommendations and appreciate input    Objective    Interim History:  Telemetry: Sinus tachycardia    Objective: Filed Vitals:   11/08/15 0334 11/08/15 0400 11/08/15 0500 11/08/15 0600  BP:  107/57  88/39  Pulse:  77 78 75  Temp: 98.3 F (36.8 C)     TempSrc: Oral     Resp:  24 23 12   Height:      Weight:    71.8 kg (158 lb 4.6 oz)  SpO2:  99% 100% 99%    Intake/Output Summary (Last 24 hours) at 11/08/15 0744 Last data filed at 11/08/15 0600  Gross per 24 hour  Intake   2325 ml  Output   1570 ml  Net    755 ml    Exam:  General: eomi ncat  Poor dentition Eyes appear equal with no exudate s1 s 2no m/r/g cta b abd soft nt nd no rebound Power le is limited but 5/5.  Did not examine reflexes   Data Reviewed: Basic Metabolic Panel:  Recent Labs Lab 11/01/15 1328 11/05/15 1821 11/06/15 0229  NA 136 132* 135  K 3.5 4.7 4.2  CL 102 102 105  CO2 22 23 24   GLUCOSE 387* 277*  266*  BUN 19 16 15   CREATININE 0.4* 0.52 0.52  CALCIUM 8.4 8.5* 7.9*   Liver Function Tests:  Recent Labs Lab 11/01/15 1328 11/06/15 0229  AST 82* 16  ALT 272* 55*  ALKPHOS 272* 168*  BILITOT 1.70* 1.8*  PROT 5.3* 4.8*  ALBUMIN 2.0* 1.7*   No results for input(s): LIPASE, AMYLASE in the last 168 hours.  Recent Labs Lab 11/06/15 0959  AMMONIA 27   CBC:  Recent Labs Lab 11/01/15 1323 11/05/15 1821 11/06/15 0229  WBC 2.4 Corrected for nRBC* 4.4 3.0*  NEUTROABS 2.2 4.0  --   HGB 10.5* 9.7* 8.6*  HCT 30.2* 27.9* 24.5*  MCV 99 99.3 99.6  PLT 51* 80* 78*   Cardiac Enzymes: No results for input(s): CKTOTAL, CKMB, CKMBINDEX, TROPONINI in the last 168 hours. BNP: Invalid input(s): POCBNP CBG:  Recent Labs Lab 11/06/15 2118 11/07/15 0757 11/07/15 1137 11/07/15 1726 11/07/15 2128  GLUCAP 255* 215* 355* 311* 303*    Recent Results (from the past 240 hour(s))  MRSA culture     Status: None   Collection Time: 11/05/15 10:46 PM  Result Value Ref Range Status   Specimen Description NOSE  Final   Special Requests NONE  Final   Culture NOMRSA Performed at [facility]  Final   Report Status 11/07/2015 FINAL  Final  MRSA PCR Screening     Status: Abnormal   Collection Time: 11/05/15 11:46 PM  Result Value Ref Range Status   MRSA by PCR INVALID RESULTS, SPECIMEN SENT FOR CULTURE (A) NEGATIVE Final    Comment: RESULT CALLED TO, READ BACK BY AND VERIFIED WITH: GARCIA,K RN [phone] [place],N        The GeneXpert MRSA Assay (FDA approved for NASAL specimens only), is one component of a comprehensive MRSA colonization surveillance program. It is not intended to diagnose MRSA infection nor to guide or monitor treatment for MRSA infections.   Culture, blood (x 2)     Status: None (Preliminary result)   Collection Time: 11/06/15  2:15 AM  Result Value Ref Range Status   Specimen Description BLOOD BLOOD LEFT HAND  Final   Special Requests BOTTLES  DRAWN AEROBIC AND ANAEROBIC 5ML  Final   Culture   Final    NO GROWTH 1 DAY Performed at [facility]    Report Status PENDING  Incomplete  Culture, blood (x 2)     Status: None (Preliminary result)   Collection Time: 11/06/15  2:29 AM  Result Value Ref Range Status   Specimen Description BLOOD LEFT ANTECUBITAL  Final   Special Requests BOTTLES DRAWN AEROBIC AND ANAEROBIC 5ML  Final   Culture   Final    NO GROWTH 1 DAY Performed at [facility]    Report Status PENDING  Incomplete     Studies:              All Imaging reviewed and is as per above notation   Scheduled Meds: . antiseptic oral rinse  7 mL Mouth Rinse BID  . aztreonam  2 g Intravenous Q8H  . dexamethasone  2 mg Oral Q12H  . feeding supplement (ENSURE ENLIVE)  237 mL Oral BID BM  . gabapentin  300 mg Oral TID  . insulin aspart  0-20 Units Subcutaneous TID WC  . insulin aspart  0-5 Units Subcutaneous QHS  . insulin glargine  20 Units Subcutaneous Q2200  . levETIRAcetam  500 mg Oral BID  . sodium chloride  10-40 mL Intracatheter Q12H  . sodium hypochlorite   Topical BID  . tobramycin  2 drop Right Eye 4 times per day  . vitamin A & D       Continuous Infusions: . sodium chloride 75 mL/hr at 11/07/15 1054     Assessment/Plan:  1. Possible aspiration pneumonia-transition to Aztreonam monotherapy and will now transition to PO clindamycin 300 q8.  Rpt CXR as exam suboptimal.  Cut back fluid rate from 125-->75 cc/hr on 11/10.  Nearing medical stability from my standpoint and itappears husband want  to take her home-not sur eif this is feasible given debilitated state and unclear if enough support at home.  Get eval by PT.  Would recommend [facility] facility, but husband is "still thinking" about things 2. Hypotension-partially constitutional-had been on hig doses of steroids so might need to continue on those doses  Has been stable and patient mentating about the same.  Can tx to tele 3. Triple  positive right breast cancer status post chemotherapy and XRT-appreciate oncology input and  palliative care consult .  Delineating Goals with family continuing 4.  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  Lantus 20  units--> 28 subcutaneous on 11/08/15, resistant sliding scale coverage 4 times a day before meals. Blood sugars ranging between 255-303.   5. Chronic hyponatremia-resolved with IV saline-no further workup-stable 6. Relative pancytopenia secondary to infection, cancer. Watch counts in am 7. Transaminitis-LFTs deranged on admission alkaline phosphatase 272 AST ALT 82/272, total bili 1.7-likely secondary to metastatic spread of cancer. Counts to be checked in am  Code Status: appears to be partial code. Given functional decline not sure how realistic this is.   Palliative care delineating goals  Family Communication:   Discussed with husband.  He is still contemplating [place] Disposition Plan: Inpatient at present-will trnasfer tele withing next 24-48 hours dependant on BP   Verneita Griffes, MD  [facility] Pager [phone] 11/08/2015, 7:44 AM    LOS: 3 days

## 2015-11-08 NOTE — Evaluation (Signed)
Physical Therapy Evaluation Patient Details Name: Mary [name] MRN: [id number] DOB: 1956/05/13 Today's Date: 11/08/2015   History of Present Illness  59 y.o. female with a past medical history significant for metastatic breast cancer c/b numerous hemorrhagic brain mets with seizures last month, hypertension, arthritis, migraine HA and admitted for AMS, FTT and sepsis.  Clinical Impression  Pt admitted with above diagnosis. Pt currently with functional limitations due to the deficits listed below (see PT Problem List). Pt will benefit from skilled PT to increase their independence and safety with mobility to allow discharge to the venue listed below.   Pt required at least max assist for safe transfer OOB to recliner today.  Spouse reports he plans to take pt home however he states decreased caregiver support during the day. He would be available at night.  Spouse also states that pt would need to assist more with mobility for care at home.  Per chart review, spouse does not appear open to hospice discussion at this time however question if pt would tolerate SNF for rehab.  Since plan is currently home, would recommend as much caregiver assist as possible and HHPT to educate caregivers in home setting on safe mobility.     Follow Up Recommendations Home health PT;Supervision/Assistance - 24 hour    Equipment Recommendations  3in1 (PT);Wheelchair (measurements PT)    Recommendations for Other Services       Precautions / Restrictions Precautions Precautions: Fall Precaution Comments: monitor HR      Mobility  Bed Mobility Overal bed mobility: Needs Assistance;+2 for physical assistance Bed Mobility: Supine to Sit     Supine to sit: Max assist;+2 for physical assistance     General bed mobility comments: verbal cues for self assist however pt seemed to have difficulty processing so attempted to provide more tactile/manual cues  Transfers Overall transfer level: Needs  assistance Equipment used: Rolling walker (2 wheeled) Transfers: Sit to/from [facility] Sit to Stand: Mod assist;+2 physical assistance Stand pivot transfers: Max assist;+2 physical assistance       General transfer comment: assist to rise and steady, pt appeared anxious and HR increased quickly up to 144 bpm, pt attempting to sit while turning so safely assisted with controlled descent into recliner  Ambulation/Gait                Stairs            Wheelchair Mobility    Modified Rankin (Stroke Patients Only)       Balance Overall balance assessment: Needs assistance Sitting-balance support: Bilateral upper extremity supported;Feet supported Sitting balance-Leahy Scale: Poor Sitting balance - Comments: requires UE assist   Standing balance support: Bilateral upper extremity supported;During functional activity Standing balance-Leahy Scale: Poor                               Pertinent Vitals/Pain Pain Assessment: No/denies pain    Home Living Family/patient expects to be discharged to:: Private residence Living Arrangements: Spouse/significant other Available Help at Discharge: Family;Other (Comment) Type of Home: House Home Access: Level entry     Home Layout: One level Home Equipment: Cane - quad Additional Comments: spouse states pt has walker but "not like that one" pointing to RW, also reports family member is attempting to acquire w/c    Prior Function Level of Independence: Needs assistance   Gait / Transfers Assistance Needed: some assist/supervision ambulating short distance to bathroom, increased  weakness and decreased mobility this past month           Hand Dominance        Extremity/Trunk Assessment   Upper Extremity Assessment: Generalized weakness           Lower Extremity Assessment: Generalized weakness         Communication   Communication: Expressive difficulties (difficult to  understand at times)  Cognition Arousal/Alertness: Awake/alert (groggy/sleeping upon arrival, improved with mobility) Behavior During Therapy: Anxious Overall Cognitive Status: Impaired/Different from baseline Area of Impairment: Following commands;Safety/judgement       Following Commands: Follows one step commands with increased time Safety/Judgement: Decreased awareness of safety;Decreased awareness of deficits     General Comments: decreased alertness/wakeful state, assisted to recliner and immediately sleeping again    General Comments      Exercises        Assessment/Plan    PT Assessment Patient needs continued PT services  PT Diagnosis Generalized weakness   PT Problem List Decreased strength;Decreased activity tolerance;Decreased mobility;Decreased balance;Decreased coordination  PT Treatment Interventions DME instruction;Gait training;Functional mobility training;Patient/family education;Therapeutic activities;Therapeutic exercise;Wheelchair mobility training   PT Goals (Current goals can be found in the Care Plan section) Acute Rehab PT Goals PT Goal Formulation: With patient/family Time For Goal Achievement: 11/15/15 Potential to Achieve Goals: Fair    Frequency Min 3X/week   Barriers to discharge Decreased caregiver support      Co-evaluation               End of Session Equipment Utilized During Treatment: Gait belt Activity Tolerance: Patient limited by fatigue Patient left: in chair;with call bell/phone within reach;with family/visitor present (spouse to call for pt assist back to bed or if he leaves room) Nurse Communication: Mobility status;Need for lift equipment         Time: [id number] PT Time Calculation (min) (ACUTE ONLY): 19 min   Charges:   PT Evaluation $Initial PT Evaluation Tier I: 1 Procedure     PT G Codes:        Helios Kohlmann,KATHrine E 11/08/2015, 3:31 PM Carmelia Bake, PT, DPT 11/08/2015 Pager: [phone]

## 2015-11-09 ENCOUNTER — Inpatient Hospital Stay (HOSPITAL_COMMUNITY): Payer: Medicaid Other

## 2015-11-09 DIAGNOSIS — R400 Somnolence: Secondary | ICD-10-CM

## 2015-11-09 DIAGNOSIS — A4101 Sepsis due to Methicillin susceptible Staphylococcus aureus: Secondary | ICD-10-CM

## 2015-11-09 DIAGNOSIS — Z7189 Other specified counseling: Secondary | ICD-10-CM

## 2015-11-09 LAB — COMPREHENSIVE METABOLIC PANEL
ALK PHOS: 146 U/L — AB (ref 38–126)
ALT: 39 U/L (ref 14–54)
AST: 26 U/L (ref 15–41)
Albumin: 1.5 g/dL — ABNORMAL LOW (ref 3.5–5.0)
Anion gap: 5 (ref 5–15)
BILIRUBIN TOTAL: 0.8 mg/dL (ref 0.3–1.2)
BUN: 15 mg/dL (ref 6–20)
CALCIUM: 7.8 mg/dL — AB (ref 8.9–10.3)
CHLORIDE: 110 mmol/L (ref 101–111)
CO2: 23 mmol/L (ref 22–32)
CREATININE: 0.36 mg/dL — AB (ref 0.44–1.00)
Glucose, Bld: 190 mg/dL — ABNORMAL HIGH (ref 65–99)
Potassium: 3.9 mmol/L (ref 3.5–5.1)
Sodium: 138 mmol/L (ref 135–145)
TOTAL PROTEIN: 4.4 g/dL — AB (ref 6.5–8.1)

## 2015-11-09 LAB — CBC WITH DIFFERENTIAL/PLATELET
BASOS ABS: 0 10*3/uL (ref 0.0–0.1)
BASOS PCT: 0 %
EOS ABS: 0 10*3/uL (ref 0.0–0.7)
Eosinophils Relative: 0 %
HEMATOCRIT: 23.5 % — AB (ref 36.0–46.0)
Hemoglobin: 7.9 g/dL — ABNORMAL LOW (ref 12.0–15.0)
LYMPHS ABS: 0.3 10*3/uL — AB (ref 0.7–4.0)
LYMPHS PCT: 12 %
MCH: 34.5 pg — AB (ref 26.0–34.0)
MCHC: 33.6 g/dL (ref 30.0–36.0)
MCV: 102.6 fL — AB (ref 78.0–100.0)
MONOS PCT: 5 %
Monocytes Absolute: 0.1 10*3/uL (ref 0.1–1.0)
NEUTROS ABS: 2.5 10*3/uL (ref 1.7–7.7)
NEUTROS PCT: 83 %
PLATELETS: 123 10*3/uL — AB (ref 150–400)
RBC: 2.29 MIL/uL — ABNORMAL LOW (ref 3.87–5.11)
RDW: 19.1 % — ABNORMAL HIGH (ref 11.5–15.5)
WBC: 2.9 10*3/uL — AB (ref 4.0–10.5)

## 2015-11-09 LAB — GLUCOSE, CAPILLARY
GLUCOSE-CAPILLARY: 138 mg/dL — AB (ref 65–99)
GLUCOSE-CAPILLARY: 142 mg/dL — AB (ref 65–99)
Glucose-Capillary: 167 mg/dL — ABNORMAL HIGH (ref 65–99)
Glucose-Capillary: 202 mg/dL — ABNORMAL HIGH (ref 65–99)

## 2015-11-09 LAB — LEGIONELLA PNEUMOPHILA SEROGP 1 UR AG: L. pneumophila Serogp 1 Ur Ag: NEGATIVE

## 2015-11-09 MED ORDER — INSULIN GLARGINE 100 UNIT/ML ~~LOC~~ SOLN
34.0000 [IU] | Freq: Every day | SUBCUTANEOUS | Status: DC
  Administered 2015-11-09: 34 [IU] via SUBCUTANEOUS
  Filled 2015-11-09 (×2): qty 0.34

## 2015-11-09 MED ORDER — FUROSEMIDE 20 MG PO TABS
20.0000 mg | ORAL_TABLET | ORAL | Status: DC
  Administered 2015-11-09: 20 mg via ORAL
  Filled 2015-11-09: qty 1

## 2015-11-09 NOTE — Progress Notes (Signed)
LERA GAINES [id number] DOB: 16-Jul-1956 DOA: 11/05/2015 PCP: Sandi Mariscal, MD  Brief narrative: 59 y/o ? Triple + Br Ca + Brain Mets foll [name] diag 03/2014 s/p XRT +n 5 Cycles Chemo Recurrent sz 10/06/15 and admitted Htn Chr hyponatremia IDDM Chr wound to R breast on Abx [doxycycline]  Patient admitted from home with ~ 1 month of progressive weakness as well as debility and failure to thrive. Poor appetite since last admission 10/06/15. On admission noted to be very sleepy and inconsistently responses which is not her typical Workup on admission concerning for new airspace process perihilar bibasilar L >R and admitted for concerns of aspiration pneumonia    Past medical history-As per Problem list Chart reviewed as below- Reviewed  Consultants:  Oncology  Palliative care  Procedures:  Chest x-ray  Antibiotics:  Aztreonam 11/8  Vancomycin 11/8   Subjective   Much more alert Seemingly more responsive to questions Can tell me at [place] tell me date or time nor season    Objective    Interim History:  Telemetry: Sinus tachycardia    Objective: Filed Vitals:   11/09/15 1000 11/09/15 1100 11/09/15 1200 11/09/15 1300  BP: 125/83  109/70   Pulse:      Temp:   97.9 F (36.6 C)   TempSrc:   Oral   Resp: 21 14 19 14   Height:      Weight:      SpO2:        Intake/Output Summary (Last 24 hours) at 11/09/15 1450 Last data filed at 11/09/15 0800  Gross per 24 hour  Intake    940 ml  Output   1555 ml  Net   -615 ml    Exam:  General: eomi ncat  Poor dentition Some mild thrush Eyes appear equal with no exudate s1 s 2no m/r/g cta b abd soft nt nd no rebound Power le is limited but 5/5.  Did not examine reflexes   Data Reviewed: Basic Metabolic Panel:  Recent Labs Lab 11/05/15 1821 11/06/15 0229 11/08/15 0839 11/09/15 0400  NA 132* 135 135 138  K 4.7 4.2 4.0 3.9  CL 102 105 107 110  CO2 23 24 23 23   GLUCOSE 277* 266*  256* 190*  BUN 16 15 18 15   CREATININE 0.52 0.52 0.50 0.36*  CALCIUM 8.5* 7.9* 8.0* 7.8*   Liver Function Tests:  Recent Labs Lab 11/06/15 0229 11/09/15 0400  AST 16 26  ALT 55* 39  ALKPHOS 168* 146*  BILITOT 1.8* 0.8  PROT 4.8* 4.4*  ALBUMIN 1.7* 1.5*   No results for input(s): LIPASE, AMYLASE in the last 168 hours.  Recent Labs Lab 11/06/15 0959  AMMONIA 27   CBC:  Recent Labs Lab 11/05/15 1821 11/06/15 0229 11/09/15 0400  WBC 4.4 3.0* 2.9*  NEUTROABS 4.0  --  2.5  HGB 9.7* 8.6* 7.9*  HCT 27.9* 24.5* 23.5*  MCV 99.3 99.6 102.6*  PLT 80* 78* 123*   Cardiac Enzymes: No results for input(s): CKTOTAL, CKMB, CKMBINDEX, TROPONINI in the last 168 hours. BNP: Invalid input(s): POCBNP CBG:  Recent Labs Lab 11/08/15 1142 11/08/15 1645 11/08/15 2140 11/09/15 0751 11/09/15 1211  GLUCAP 277* 299* 288* 138* 142*    Recent Results (from the past 240 hour(s))  MRSA culture     Status: None   Collection Time: 11/05/15 10:46 PM  Result Value Ref Range Status   Specimen Description NOSE  Final   Special Requests NONE  Final  Culture NOMRSA Performed at [facility]   Final   Report Status 11/07/2015 FINAL  Final  MRSA PCR Screening     Status: Abnormal   Collection Time: 11/05/15 11:46 PM  Result Value Ref Range Status   MRSA by PCR INVALID RESULTS, SPECIMEN SENT FOR CULTURE (A) NEGATIVE Final    Comment: RESULT CALLED TO, READ BACK BY AND VERIFIED WITH: GARCIA,K RN [phone] [place],N        The GeneXpert MRSA Assay (FDA approved for NASAL specimens only), is one component of a comprehensive MRSA colonization surveillance program. It is not intended to diagnose MRSA infection nor to guide or monitor treatment for MRSA infections.   Culture, blood (x 2)     Status: None (Preliminary result)   Collection Time: 11/06/15  2:15 AM  Result Value Ref Range Status   Specimen Description BLOOD BLOOD LEFT HAND  Final   Special Requests BOTTLES  DRAWN AEROBIC AND ANAEROBIC 5ML  Final   Culture   Final    NO GROWTH 3 DAYS Performed at [facility]    Report Status PENDING  Incomplete  Culture, blood (x 2)     Status: None (Preliminary result)   Collection Time: 11/06/15  2:29 AM  Result Value Ref Range Status   Specimen Description BLOOD LEFT ANTECUBITAL  Final   Special Requests BOTTLES DRAWN AEROBIC AND ANAEROBIC 5ML  Final   Culture   Final    NO GROWTH 3 DAYS Performed at [facility]    Report Status PENDING  Incomplete     Studies:              All Imaging reviewed and is as per above notation   Scheduled Meds: . antiseptic oral rinse  7 mL Mouth Rinse BID  . clindamycin  300 mg Oral 3 times per day  . dexamethasone  2 mg Oral Q12H  . feeding supplement (ENSURE ENLIVE)  237 mL Oral BID BM  . gabapentin  300 mg Oral TID  . insulin aspart  0-20 Units Subcutaneous TID WC  . insulin aspart  0-5 Units Subcutaneous QHS  . insulin glargine  34 Units Subcutaneous Q2200  . levETIRAcetam  500 mg Oral BID  . nystatin  5 mL Oral QID  . senna-docusate  1 tablet Oral BID  . sodium chloride  10-40 mL Intracatheter Q12H  . tobramycin  2 drop Right Eye 4 times per day   Continuous Infusions: . sodium chloride 100 mL/hr at 11/08/15 2000     Assessment/Plan:  1. Possible aspiration pneumonia-transition to Aztreonam monotherapy and will now transition to PO clindamycin 300 q8 on 11/12.  Rpt CXR as exam suboptimal.  Cut back fluid rate from 125-->75 cc/hr on 11/10.  eval by PT reveals HH eleigible. Hypotension-partially constitutional-had been on high doses of steroids so might need to continue on those doses  Has been stable and patient mentating about the same.  Can tx to tele 2. 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  Delineating Goals with family continuing- Appreciate palliative checking in on her.  I spoke with both sister  today who is convinced that a miracle will occur-she has personal experience with cancer and recovered from some form of it.  Husband also seems focused on diet and anemia when I tell him of results and is open to divine interventions.   3. Thrush-continue Nystatin suspension ongoing.  Cannot  narrow of of steroids because of met brain disease 4.  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 Insulin-dependent diabetic ?  Lantus 20 units--> 28 subcutaneous--->34 on 11/08/15, resistant sliding scale coverage 4 times a day before meals. Blood sugars ranging between 300 range.  Cannot really narrow off of steroids given brain mets     6. Chronic hyponatremia-resolved with IV saline-no further workup-stable 7. Relative pancytopenia secondary to infection, cancer. Dropped hemoglobin today.  Will consider transfusion if below Hb 7.0 in am 8. Transaminitis-LFTs deranged on admission alkaline phosphatase 272 AST ALT 82/272, total bili 1.7-likely secondary to metastatic spread of cancer. No further checks  Code Status: appears to be partial code.  Family Communication:   Discussed with husband.  I have let him know that our end in terms of acute hospital stay is near.  We will plan on d/c home tomorrow and i have encouraged him to think about Hospice  to help at home should she decline in terms of benefits and having someone look in on her as OP Disposition Plan: transfer tele   Verneita Griffes, MD  [facility] Pager [phone] 11/09/2015, 2:50 PM    LOS: 4 days

## 2015-11-09 NOTE — Clinical Social Work Note (Signed)
Per PT note, patient and husband plan to discharge home with home health.  CSW to sign off please re-consult if social work needs arise.  Jones Broom. [place], MSW, [place]

## 2015-11-09 NOTE — Progress Notes (Signed)
Daily Progress Note   Patient Name: Mary [name]       Date: 11/09/2015 DOB: 04-10-1956  Age: 59 y.o. MRN#: [id number] Attending Physician: Nita Sells, MD Primary Care Physician: Sandi Mariscal, MD Admit Date: 11/05/2015  Reason for Consultation/Follow-up: Establishing goals of care and Psychosocial/spiritual support  Subjective:    I met with Mary [name] and her sister who is present in the room at time of encounter. Her sister reports that she thinks that she is feeling better today and Mary [name] seemed to nod in agreement with this.   I asked her what the important things he should be focusing on moving forward and she reports "getting well," family, being at home, and her faith.  I asked her understanding of her current condition and she and her sister reported, "things are now in God's hands."  They seemed to have good understanding that there are no further disease modifying therapies available, however, they are not able to engage in conversation about plan of care other than a full, miraculous healing.  I tried to introduce the concept that once someone reaches a point when there is no further disease modifying therapy available, that does not mean there is no further care to give, but rather means we need to focus on aggressive symptom management to allow her the best quality of life possible with the goal of retained functional status. They were not willing to engage in this conversation either, and emphasized again that the goal of Mary [name] and her family is to wait until a miracle occurs.  -will continue to support holistically as family allows for interaction.    Length of Stay: 4 days  Current Medications: Scheduled Meds:  . antiseptic oral rinse  7 mL Mouth Rinse BID  . clindamycin  300 mg Oral 3 times per day  . dexamethasone  2 mg Oral Q12H  . feeding supplement (ENSURE ENLIVE)  237 mL Oral BID BM  . gabapentin  300 mg Oral TID  . insulin aspart  0-20 Units  Subcutaneous TID WC  . insulin aspart  0-5 Units Subcutaneous QHS  . insulin glargine  28 Units Subcutaneous Q2200  . levETIRAcetam  500 mg Oral BID  . nystatin  5 mL Oral QID  . senna-docusate  1 tablet Oral BID  . sodium chloride  10-40 mL Intracatheter Q12H  . tobramycin  2 drop Right Eye 4 times per day    Continuous Infusions: . sodium chloride 100 mL/hr at 11/08/15 2000    PRN Meds: LORazepam, morphine injection, ondansetron **OR** ondansetron (ZOFRAN) IV, sodium chloride  Palliative Performance Scale: 20 % at best     Vital Signs: BP 128/77 mmHg  Pulse 76  Temp(Src) 97.3 F (36.3 C) (Oral)  Resp 9  Ht 5' 5"  (1.651 m)  Wt 71.8 kg (158 lb 4.6 oz)  BMI 26.34 kg/m2  SpO2 100% SpO2: SpO2: 100 % O2 Device: O2 Device: Not Delivered O2 Flow Rate:    Intake/output summary:   Intake/Output Summary (Last 24 hours) at 11/09/15 1206 Last data filed at 11/09/15 0800  Gross per 24 hour  Intake   1180 ml  Output   1780 ml  Net   -600 ml   LBM: Last BM Date: 11/05/15 Baseline Weight: Weight: 71.8 kg (158 lb 4.6 oz) Most recent weight: Weight: 71.8 kg (158 lb 4.6 oz)  Physical Exam:  Constitutional: Vital signs are normal. She appears well-developed. She appears sleepy but is able to open eyes  and participate in conversation.  Head: Normocephalic.  Mouth/Throat: No oropharyngeal exudate.  Cardiovascular: Normal rate, regular rhythm, normal heart sounds and normal pulses.  Respiratory: Effort normal and breath sounds normal.  GI: Soft. Normal appearance and bowel sounds are normal.   Skin: Skin is warm and dry.  Noted breast wound  Additional Data Reviewed: Recent Labs     11/08/15  0839  11/09/15  0400  WBC   --   2.9*  HGB   --   7.9*  PLT   --   123*  NA  135  138  BUN  18  15  CREATININE  0.50  0.36*     Problem List:  Patient Active Problem List   Diagnosis Date Noted  . Palliative care encounter 11/07/2015  . DNR (do not resuscitate) discussion  11/07/2015  . Altered mental status   . Anemia, unspecified 11/06/2015  . Thrombocytopenia ([place]) 11/06/2015  . HCAP (healthcare-associated pneumonia) 11/05/2015  . 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 Metastatic cancer to brain ([place])   . Hyperglycemia 10/06/2015  . Seizure ([place]) 10/06/2015  . Status epilepticus ([place]) 10/06/2015  . Wound infection ([place]) 10/06/2015  . over 75 brain metastases, several showing hemorrhage 03/04/2015  . Wound of right breast 08/06/2014  . Breast cancer metastasized to multiple sites ([place]) 04/12/2014  . Protein-calorie malnutrition, severe ([place]) 04/06/2014  . Fever, unspecified 04/05/2014  . Right hip pain 04/05/2014  . Abdominal tenderness, generalized 04/05/2014  . Back pain 04/05/2014  . Cellulitis 04/05/2014  . Migraine headache 04/05/2014  . Elevated liver enzymes 04/05/2014  . Sepsis ([place]) 04/05/2014  . Preventative health care 07/04/2013  . Anxiety state, unspecified 07/04/2013  . Arthritis   . Depression   . Hypertension   . Migraines      Palliative Care Assessment & Plan    Code Status:  Limited code  Goals of Care: - Family remains hopeful for a miracle. They are unwilling to engage in conversation that includes possibility of anything other than a full recovery. I tried to pursue her thoughts on what it may look like if the miracle they are looking for does not occur in the form of healing as they are hoping. Her sister cut me off during this line of questioning and stated this is not something the family is willing to accept as a possibility. - I also tried to focus on ways to help achieve goal of feeling as well as possible, being at home, spending time with family, and spending time in her faith. Again, she and her sister were unwilling to engage in conversation involved any possibility other than a full and miraculous recovery.     Prognosis: Weeks at best  5. Discharge Planning: Pending   Thank you for allowing  the Palliative Medicine Team to assist in the care of this patient.   Time In: 1140 Time Out: 1210 Total Time 30 Prolonged Time Billed  no     Greater than 50%  of this time was spent counseling and coordinating care related to the above assessment and plan.     Micheline Rough, MD  11/09/2015, 12:06 PM  Please contact Palliative Medicine Team phone at [phone] for questions and concerns.

## 2015-11-10 DIAGNOSIS — Z515 Encounter for palliative care: Secondary | ICD-10-CM

## 2015-11-10 DIAGNOSIS — A419 Sepsis, unspecified organism: Principal | ICD-10-CM

## 2015-11-10 DIAGNOSIS — E43 Unspecified severe protein-calorie malnutrition: Secondary | ICD-10-CM

## 2015-11-10 LAB — COMPREHENSIVE METABOLIC PANEL
ALBUMIN: 1.9 g/dL — AB (ref 3.5–5.0)
ALT: 53 U/L (ref 14–54)
ANION GAP: 6 (ref 5–15)
AST: 34 U/L (ref 15–41)
Alkaline Phosphatase: 182 U/L — ABNORMAL HIGH (ref 38–126)
BILIRUBIN TOTAL: 1.2 mg/dL (ref 0.3–1.2)
BUN: 12 mg/dL (ref 6–20)
CO2: 25 mmol/L (ref 22–32)
Calcium: 8 mg/dL — ABNORMAL LOW (ref 8.9–10.3)
Chloride: 107 mmol/L (ref 101–111)
Creatinine, Ser: 0.34 mg/dL — ABNORMAL LOW (ref 0.44–1.00)
GFR calc non Af Amer: >60 mL/min (ref >60)
GLUCOSE: 117 mg/dL — AB (ref 65–99)
POTASSIUM: 3.3 mmol/L — AB (ref 3.5–5.1)
SODIUM: 138 mmol/L (ref 135–145)
TOTAL PROTEIN: 4.7 g/dL — AB (ref 6.5–8.1)

## 2015-11-10 LAB — CBC WITH DIFFERENTIAL/PLATELET
BASOS ABS: 0 10*3/uL (ref 0.0–0.1)
Basophils Relative: 0 %
EOS ABS: 0 10*3/uL (ref 0.0–0.7)
Eosinophils Relative: 1 %
HEMATOCRIT: 25.8 % — AB (ref 36.0–46.0)
Hemoglobin: 8.8 g/dL — ABNORMAL LOW (ref 12.0–15.0)
LYMPHS ABS: 0.5 10*3/uL — AB (ref 0.7–4.0)
Lymphocytes Relative: 15 %
MCH: 35.2 pg — ABNORMAL HIGH (ref 26.0–34.0)
MCHC: 34.1 g/dL (ref 30.0–36.0)
MCV: 103.2 fL — ABNORMAL HIGH (ref 78.0–100.0)
Monocytes Absolute: 0.1 10*3/uL (ref 0.1–1.0)
Monocytes Relative: 4 %
NEUTROS ABS: 2.6 10*3/uL (ref 1.7–7.7)
Neutrophils Relative %: 80 %
Platelets: 158 10*3/uL (ref 150–400)
RBC: 2.5 MIL/uL — ABNORMAL LOW (ref 3.87–5.11)
RDW: 19.4 % — AB (ref 11.5–15.5)
WBC: 3.2 10*3/uL — ABNORMAL LOW (ref 4.0–10.5)

## 2015-11-10 LAB — GLUCOSE, CAPILLARY
Glucose-Capillary: 100 mg/dL — ABNORMAL HIGH (ref 65–99)
Glucose-Capillary: 153 mg/dL — ABNORMAL HIGH (ref 65–99)

## 2015-11-10 MED ORDER — CLINDAMYCIN HCL 300 MG PO CAPS: 300.0000 mg | ORAL_CAPSULE | Freq: Three times a day (TID) | ORAL | Status: DC

## 2015-11-10 MED ORDER — TOBRAMYCIN 0.3 % OP SOLN: 2.0000 [drp] | Freq: Four times a day (QID) | OPHTHALMIC | Status: DC

## 2015-11-10 MED ORDER — OXYCODONE HCL 5 MG PO TABS: 5.0000 mg | ORAL_TABLET | Freq: Four times a day (QID) | ORAL | Status: DC | PRN

## 2015-11-10 MED ORDER — ENSURE ENLIVE PO LIQD: 237.0000 mL | Freq: Two times a day (BID) | ORAL | Status: AC

## 2015-11-10 MED ORDER — LEVETIRACETAM 500 MG PO TABS: 500.0000 mg | ORAL_TABLET | Freq: Two times a day (BID) | ORAL | Status: AC

## 2015-11-10 MED ORDER — HEPARIN SOD (PORK) LOCK FLUSH 100 UNIT/ML IV SOLN
500.0000 [IU] | INTRAVENOUS | Status: AC | PRN
Start: 2015-11-10 — End: 2015-11-10
  Administered 2015-11-10: 500 [IU]

## 2015-11-10 MED ORDER — INSULIN GLARGINE 100 UNIT/ML ~~LOC~~ SOLN: 30.0000 [IU] | Freq: Every day | SUBCUTANEOUS | Status: DC

## 2015-11-10 MED ORDER — POTASSIUM CHLORIDE CRYS ER 20 MEQ PO TBCR: 20.0000 meq | EXTENDED_RELEASE_TABLET | Freq: Every day | ORAL | Status: DC

## 2015-11-10 MED ORDER — FLEET ENEMA 7-19 GM/118ML RE ENEM
1.0000 | ENEMA | Freq: Once | RECTAL | Status: AC
  Administered 2015-11-10: 1 via RECTAL

## 2015-11-10 MED ORDER — NYSTATIN 100000 UNIT/ML MT SUSP: 5.0000 mL | Freq: Four times a day (QID) | OROMUCOSAL | Status: AC | PRN

## 2015-11-10 MED ORDER — DEXAMETHASONE 2 MG PO TABS: 2.0000 mg | ORAL_TABLET | Freq: Two times a day (BID) | ORAL | Status: AC

## 2015-11-10 NOTE — Discharge Summary (Signed)
Physician Discharge Summary  Mary [name] XKG:[id number] DOB: Oct 09, 1956 DOA: 11/05/2015  PCP: Sandi Mariscal, MD  Admit date: 11/05/2015 Discharge date: 11/10/2015  Time spent: 55 minutes  Recommendations for Outpatient Follow-up:  1. Needs further discussion of goals of care as OP 2. Very high risk for hospital re-admission as abyssmal overall prognosis in a setting strong religious beliefs affecting family capability to comprehend that patient's body is failing her--we will ask for social worker to follow as an outpatient own health  3. Would suggest labs as an outpatient as per oncologist [name]  4. Would suggest if she presents to the emergency room frank discussion by involved parties about overall prognosis  Discharge Diagnoses:  Principal Problem:   Sepsis ([place]) Active Problems:   Hypertension   Protein-calorie malnutrition, severe ([place])   Breast cancer metastasized to multiple sites [facility])   Wound of right breast   over 75 brain metastases, several showing hemorrhage   Seizure ([place])   HCAP (healthcare-associated pneumonia)   Anemia, unspecified   Thrombocytopenia ([place])   Palliative care encounter   DNR (do not resuscitate) discussion   Altered mental status   Discharge Condition: Very guarded  Diet recommendation: Liberalize diet whatever patient continued  [facility] Weights   11/07/15 0329 11/08/15 0600  Weight: 71.8 kg (158 lb 4.6 oz) 71.8 kg (158 lb 4.6 oz)    History of present illness:  59 y/o ? Triple + Br Ca + Brain Mets foll [name] diag 03/2014 s/p XRT +n 5 Cycles Chemo Recurrent sz 10/06/15 and admitted Htn Chr hyponatremia IDDM Chr wound to R breast on Abx [doxycycline]  Patient admitted from home with ~ 1 month of progressive weakness as well as debility and failure to thrive. Poor appetite since last admission 10/06/15. On admission noted to be very sleepy and inconsistently responses which is not her typical Workup on admission concerning for  new airspace process perihilar bibasilar L >R and admitted for concerns of aspiration pneumonia  Hospital Course:    1. Toxic metabolic encephalopathy-probably secondary to brain metastases more so than infectious process . Seemed to resolve during admission with steroid taper 2. Pssible aspiration pneumonia-initially on vancomycin/aztreonam --> Aztreonam --> to PO clindamycin 300 q8 on 11/12. Rpt CXR as exam suboptimal. Cut back fluid rate from 125-->75 cc/hr on 11/10--saline locked on discharge eval by PT reveals HH eleigible. 3. Hypotension-partially constitutional-had been on high doses of steroids. 4. 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 Home health to come out and assist with these changes and teach husband how to use appreciate oncology input and palliative care consult. Her prealbumin is very low and oncology has given family the opinion that there are no other opinions and that she would benefit from hospice care .  I spoke with sister earlier this admission who is convinced that a miracle will occur-she has personal experience with cancer and recovered from some form of it. Husband also seems focused on all life prolonging regimens and is open to divine interventions.  5. Thrush-continue Nystatin suspension ongoing. Cannot narrow of of steroids because of met brain disease 6. seizures secondary to metastatic breast cancer-continue Keppra 500 twice a day, continue Decadron 4 mg 3 times a day-->2 mg q 12 per Oncology 7. Insulin-dependent diabetic ? Lantus 20 units--> 28 subcutaneous--->34 on 11/08/15, resistant sliding scale coverage 4 times a day before meals. Blood sugars ranging  between 300 range and came down so we narrowed Lantus to 30 units at her. Cannot really narrow off of steroids given brain mets  8. Chronic hyponatremia-resolved with IV  saline-no further workup-stable 9. Relative pancytopenia secondary to infection, cancer. Dropped hemoglobin today. Will consider transfusion if below Hb 7.0 in am 10. Transaminitis-LFTs deranged on admission alkaline phosphatase 272 AST ALT 82/272, total bili 1.7-likely secondary to metastatic spread of cancer. No further checks  Consultations:  Oncology  Palliative care   Accuchecks 4 times/day, Once in AM empty stomach and then before each meal. Log in all results and show them to your Prim.MD in 3 days. If any glucose reading is under 80 or above 300 call your Prim MD immidiately. Follow Low glucose instructions for glucose under 80 as instructed.  For Heart failure patients - Check your Weight same time everyday, if you gain over 2 pounds, or you develop in leg swelling, experience more shortness of breath or chest pain, call your Primary MD immediately. Follow Cardiac Low Salt Diet and 1.5 lit/day fluid restriction.   On your next visit with your primary care physician please Get Medicines reviewed and adjusted.   Please request your Prim.MD to go over all Hospital Tests and Procedure/Radiological results at the follow up, please get all Hospital records sent to your Prim MD by signing hospital release before you go home.   If you experience worsening of your admission symptoms, develop shortness of breath, life threatening emergency, suicidal or homicidal thoughts you must seek medical attention immediately by calling 911 or calling your MD immediately if symptoms less severe.  You Must read complete instructions/literature along with all the possible adverse reactions/side effects for all the Medicines you take and that have been prescribed to you. Take any new Medicines after you have completely understood and accpet all the possible adverse reactions/side effects.   Do not drive, operating heavy machinery, perform activities at heights, swimming or participation in water  activities or provide baby sitting services if your were admitted for syncope or siezures until you have seen by Primary MD or a Neurologist and advised to do so again.  Do not drive when taking Pain medications.    Do not take more than prescribed Pain, Sleep and Anxiety Medications  Special Instructions: If you have smoked or chewed Tobacco in the last 2 yrs please stop smoking, stop any regular Alcohol and or any Recreational drug use.  Wear Seat belts while driving.   Please note  You were cared for by a hospitalist during your hospital stay. If you have any questions about your discharge medications or the care you received while you were in the hospital after you are discharged, you can call the unit and asked to speak with the hospitalist on call if the hospitalist that took care of you is not available. Once you are discharged, your primary care physician will handle any further medical issues. Please note that NO REFILLS for any discharge medications will be authorized once you are discharged, as it is imperative that you return to your primary care physician (or establish a relationship with a primary care physician if you do not have one) for your aftercare needs so that they can reassess your need for medications and monitor your lab values.  Discharge Exam: Filed Vitals:   11/10/15 0549  BP: 112/72  Pulse:   Temp: 98.7 F (37.1 C)  Resp: 20    General: more alert pleasant oriented did not eat much  Cardiovascular: S1-S2 no murmur rub or gallop Respiratory: Clinically clear no added sound Chest wound appears about the same as before with no obvious oozing  Dischar ge Instructions   Discharge Instructions    Diet - low sodium heart healthy    Complete by:  As directed      Discharge instructions    Complete by:  As directed   Follow up with [name] for further care and management I have refilled most of her medications and given scripts for this     Increase  activity slowly    Complete by:  As directed           Current Discharge Medication List    START taking these medications   Details  clindamycin (CLEOCIN) 300 MG capsule Take 1 capsule (300 mg total) by mouth every 8 (eight) hours. Qty: 15 capsule, Refills: 0    feeding supplement, ENSURE ENLIVE, (ENSURE ENLIVE) LIQD Take 237 mLs by mouth 2 (two) times daily between meals. Qty: 237 mL, Refills: 12    insulin glargine (LANTUS) 100 UNIT/ML injection Inject 0.3 mLs (30 Units total) into the skin daily at 10 pm. Qty: 10 mL, Refills: 11    tobramycin (TOBREX) 0.3 % ophthalmic solution Place 2 drops into the right eye every 6 (six) hours. Qty: 5 mL, Refills: 0   Associated Diagnoses: Wound of right breast, initial encounter      CONTINUE these medications which have CHANGED   Details  dexamethasone (DECADRON) 2 MG tablet Take 1 tablet (2 mg total) by mouth every 12 (twelve) hours. Qty: 30 tablet, Refills: 0   Associated Diagnoses: Wound of right breast, initial encounter    levETIRAcetam (KEPPRA) 500 MG tablet Take 1 tablet (500 mg total) by mouth 2 (two) times daily. Qty: 60 tablet, Refills: 0    nystatin (MYCOSTATIN) 100000 UNIT/ML suspension Take 5 mLs (500,000 Units total) by mouth 4 (four) times daily as needed (mouth pain). Qty: 60 mL, Refills: 0    oxyCODONE (OXY IR/ROXICODONE) 5 MG immediate release tablet Take 1-3 tablets (5-15 mg total) by mouth every 6 (six) hours as needed for severe pain. Take 1-2 if needed for pain due to cancer. Qty: 40 tablet, Refills: 0   Associated Diagnoses: Breast cancer metastasized to multiple sites, unspecified laterality ([place]); Brain metastases ([place]); Breast cancer metastasized to multiple sites, right ([facility])    potassium chloride SA (K-DUR,KLOR-CON) 20 MEQ tablet Take 1 tablet (20 mEq total) by mouth daily. Qty: 30 tablet, Refills: 0   Associated Diagnoses: Breast cancer metastasized to multiple sites, right ([place]); Brain metastases ([place])       CONTINUE these medications which have NOT CHANGED   Details  ACCU-CHEK AVIVA PLUS test strip USE TO CHECK BLOOD GLUCOSE 4 TIMES DAILY AFTER MEALS AND AT BEDTIME Refills: 0    ACCU-CHEK SOFTCLIX LANCETS lancets USE TO CHECK BLOOD GLUCOSE 4 TIMES DAILY Refills: 0    Blood Glucose Monitoring Suppl (ACCU-CHEK AVIVA PLUS) W/DEVICE KIT USE TO CHECK BLOOD GLUCOSE 4 TIMES DAILY Refills: 0    gabapentin (NEURONTIN) 300 MG capsule Take 1 capsule (300 mg total) by mouth 3 (three) times daily. Qty: 90 capsule, Refills: 4   Associated Diagnoses: Peripheral neuropathy ([place]); Breast cancer metastasized to multiple sites, left ([facility])    glucose monitoring kit (FREESTYLE) monitoring kit 1 each by Does not apply route 4 (four) times daily - after meals and at bedtime. 1 month Diabetic Testing Supplies for QAC-QHS accuchecks. Qty: 1 each, Refills:  1    insulin aspart (NOVOLOG FLEXPEN) 100 UNIT/ML FlexPen 0-9 Units, Subcutaneous, 3 times daily with meals CBG < 70: call MD CBG 70 - 120: 0 units CBG 121 - 150: 1 unit CBG 151 - 200: 2 units CBG 201 - 250: 3 units CBG 251 - 300: 5 units CBG 301 - 350: 7 units CBG 351 - 400: 9 units CBG > 400: call MD  Okay to substitute with different band at equivalent dosing Qty: 15 mL, Refills: 0    Insulin Pen Needle 32G X 8 MM MISC Please provide 2 month supply. Okay to substitute with different brand Qty: 100 each, Refills: 0    ondansetron (ZOFRAN) 8 MG tablet Take 1 tablet (8 mg total) by mouth 2 (two) times daily. Start the day after chemo for 3 days. Then take as needed for nausea or vomiting. Qty: 20 tablet, Refills: 0   Associated Diagnoses: Breast cancer metastasized to multiple sites, right ([facility])    pantoprazole (PROTONIX) 40 MG tablet Take 1 tablet (40 mg total) by mouth 2 (two) times daily. Qty: 60 tablet, Refills: 0   Associated Diagnoses: Iron deficiency anemia; Breast cancer metastasized to multiple sites, right ([facility])      STOP taking these  medications     exemestane (AROMASIN) 25 MG tablet      Insulin Glargine (LANTUS SOLOSTAR) 100 UNIT/ML Solostar Pen      palbociclib (IBRANCE) 125 MG capsule        Allergies  Allergen Reactions  . Hydrocodone     "makes me crawl on the floor"  . Penicillins Swelling    Per pt's sister, swelling of throat  . Aspirin Other (See Comments)    Due to ulcers      The results of significant diagnostics from this hospitalization (including imaging, microbiology, ancillary and laboratory) are listed below for reference.    Significant Diagnostic Studies: Dg Chest 2 View  11/05/2015  CLINICAL DATA:  Shortness of breath. Last radiation treatment for breast cancer 4 days ago. EXAM: CHEST  2 VIEW COMPARISON:  10/06/2015 FINDINGS: Left IJ Port-A-Cath unchanged. Lungs are hypoinflated with mild elevation of the left hemidiaphragm unchanged. There is there is new mild perihilar bibasilar airspace opacification left-greater-than-right which may be due to edema versus infection. Suggestion of small bilateral pleural effusions which are new. Cardiomediastinal silhouette and remainder of the exam is unchanged. IMPRESSION: New hazy airspace process over the perihilar bibasilar regions left greater than right which may be due to edema versus infection. Small bilateral effusions. Electronically Signed   By: Marin Olp M.D.   On: 11/05/2015 19:02   Dg Chest Port 1 View  11/09/2015  CLINICAL DATA:  Pneumonia.  Hypertension. EXAM: PORTABLE CHEST 1 VIEW COMPARISON:  11/08/2015 FINDINGS: The patient is rotated to the bright on today's radiograph, reducing diagnostic sensitivity and specificity. Power injectable left-sided Port-A-Cath tip: SVC. Low lung volumes are present, causing crowding of the pulmonary vasculature. Bilateral interstitial accentuation is present with indistinct airspace opacities in the lung bases and perihilar regions. Mild blunting of the left costophrenic angle. Indistinct segments of  both hemidiaphragms. IMPRESSION: 1. Indistinct interstitial and basilar and perihilar airspace opacities. Cannot exclude layering effusions or underlying interstitial edema. 2. Low lung volumes. 3. The patient's known lung nodules do not stand out well on today's chest radiograph. Electronically Signed   By: Van Clines M.D.   On: 11/09/2015 08:44   Dg Chest Port 1 View  11/08/2015  CLINICAL DATA:  Pneumonia.  EXAM: PORTABLE CHEST 1 VIEW COMPARISON:  11/05/2015. FINDINGS: PowerPort catheter noted with lead tip projected over the superior vena cava. Heart size stable. Persistent bilateral pulmonary infiltrates are again noted. No interim improvement. Small bilateral pleural effusions. No pneumothorax. IMPRESSION: 1. Power port catheter stable position. 2. Persistent bilateral pulmonary infiltrates, no interim improvement. Changes may be related to bilateral pneumonia and/or pulmonary edema . Small bilateral pleural effusions . Electronically Signed   By: Marcello Moores  Register   On: 11/08/2015 08:28    Microbiology: Recent Results (from the past 240 hour(s))  MRSA culture     Status: None   Collection Time: 11/05/15 10:46 PM  Result Value Ref Range Status   Specimen Description NOSE  Final   Special Requests NONE  Final   Culture NOMRSA Performed at [facility]   Final   Report Status 11/07/2015 FINAL  Final  MRSA PCR Screening     Status: Abnormal   Collection Time: 11/05/15 11:46 PM  Result Value Ref Range Status   MRSA by PCR INVALID RESULTS, SPECIMEN SENT FOR CULTURE (A) NEGATIVE Final    Comment: RESULT CALLED TO, READ BACK BY AND VERIFIED WITH: GARCIA,K RN [phone] [id number] [place],N        The GeneXpert MRSA Assay (FDA approved for NASAL specimens only), is one component of a comprehensive MRSA colonization surveillance program. It is not intended to diagnose MRSA infection nor to guide or monitor treatment for MRSA infections.   Culture, blood (x 2)     Status: None  (Preliminary result)   Collection Time: 11/06/15  2:15 AM  Result Value Ref Range Status   Specimen Description BLOOD BLOOD LEFT HAND  Final   Special Requests BOTTLES DRAWN AEROBIC AND ANAEROBIC 5ML  Final   Culture   Final    NO GROWTH 3 DAYS Performed at [facility]    Report Status PENDING  Incomplete  Culture, blood (x 2)     Status: None (Preliminary result)   Collection Time: 11/06/15  2:29 AM  Result Value Ref Range Status   Specimen Description BLOOD LEFT ANTECUBITAL  Final   Special Requests BOTTLES DRAWN AEROBIC AND ANAEROBIC 5ML  Final   Culture   Final    NO GROWTH 3 DAYS Performed at [facility]    Report Status PENDING  Incomplete     Labs: Basic Metabolic Panel:  Recent Labs Lab 11/05/15 1821 11/06/15 0229 11/08/15 0839 11/09/15 0400 11/10/15 0500  NA 132* 135 135 138 138  K 4.7 4.2 4.0 3.9 3.3*  CL 102 105 107 110 107  CO2 23 24 23 23 25   GLUCOSE 277* 266* 256* 190* 117*  BUN 16 15 18 15 12   CREATININE 0.52 0.52 0.50 0.36* 0.34*  CALCIUM 8.5* 7.9* 8.0* 7.8* 8.0*   Liver Function Tests:  Recent Labs Lab 11/06/15 0229 11/09/15 0400 11/10/15 0500  AST 16 26 34  ALT 55* 39 53  ALKPHOS 168* 146* 182*  BILITOT 1.8* 0.8 1.2  PROT 4.8* 4.4* 4.7*  ALBUMIN 1.7* 1.5* 1.9*   No results for input(s): LIPASE, AMYLASE in the last 168 hours.  Recent Labs Lab 11/06/15 0959  AMMONIA 27   CBC:  Recent Labs Lab 11/05/15 1821 11/06/15 0229 11/09/15 0400 11/10/15 0500  WBC 4.4 3.0* 2.9* 3.2*  NEUTROABS 4.0  --  2.5 2.6  HGB 9.7* 8.6* 7.9* 8.8*  HCT 27.9* 24.5* 23.5* 25.8*  MCV 99.3 99.6 102.6* 103.2*  PLT 80* 78* 123* 158  Cardiac Enzymes: No results for input(s): CKTOTAL, CKMB, CKMBINDEX, TROPONINI in the last 168 hours. BNP: BNP (last 3 results) No results for input(s): BNP in the last 8760 hours.  ProBNP (last 3 results) No results for input(s): PROBNP in the last 8760 hours.  CBG:  Recent Labs Lab 11/09/15 0751  11/09/15 1211 11/09/15 1626 11/09/15 2209 11/10/15 0740  GLUCAP 138* 142* 167* 202* 100*       Signed:  Nita Sells  [facility] 11/10/2015, 9:05 AM

## 2015-11-10 NOTE — Clinical Social Work Note (Signed)
CSW received call for pt transport home today.  CSW met with pt and confirmed address, arranged for ptar home, prepared packet and provided to RN  .Dede Query, LCSW [facility] Clinical Social Worker - Weekend Coverage cell #: [phone]

## 2015-11-10 NOTE — Progress Notes (Signed)
11/10/15 1003  Discharge patient home with foley for comfort care per Dr Verlon Au.

## 2015-11-10 NOTE — Care Management Note (Signed)
Case Management Note  Patient Details  Name: DAMARA AGUILLON MRN: [id number] Date of Birth: [birth year]  Subjective/Objective:        Breast cancer            Action/Plan:  NCM spoke to pt and husband, Jeneen Rinks # [phone]. Pt [facility] arranged with [facility] for [facility] RN for dressing changes. [facility] will deliver hospital bed on 11/11/2015 to home. Hospital bed was arranged by PCP's office. Pt has Rolling walker with seat, bedside commode, and shower chair at home. Husband at home to assist with care. Notified [facility] of scheduled dc home today with HHRN.   Please see previous NCM notes.   Expected Discharge Date:  11/10/2015               Expected Discharge Plan:  [place]  In-House Referral:  Clinical Social Work  Discharge planning Services  CM Consult  Post Acute Care Choice:  Home Health Choice offered to:  NA, Patient   HH Arranged:  RN [place] Agency:  [place]  Status of Service:  Completed, signed off  Medicare Important Message Given:    Date Medicare IM Given:    Medicare IM give by:    Date Additional Medicare IM Given:    Additional Medicare Important Message give by:     If discussed at [place] of Stay Meetings, dates discussed:    Additional Comments:  Erenest Rasher, RN 11/10/2015, 11:52 AM

## 2015-11-10 NOTE — Progress Notes (Signed)
11/10/15  1100 Reviewed discharge instructions with patients husband. He verbalized understanding of discharge instructions. Copy of discharge instructions, prescriptions given to husband. EMS transported patient home and her home medicines were sent by EMS.

## 2015-11-11 ENCOUNTER — Other Ambulatory Visit: Payer: Self-pay | Admitting: *Deleted

## 2015-11-11 DIAGNOSIS — C50911 Malignant neoplasm of unspecified site of right female breast: Secondary | ICD-10-CM

## 2015-11-11 DIAGNOSIS — S21001A Unspecified open wound of right breast, initial encounter: Secondary | ICD-10-CM

## 2015-11-11 LAB — CULTURE, BLOOD (ROUTINE X 2)
CULTURE: NO GROWTH
CULTURE: NO GROWTH

## 2015-11-11 MED ORDER — DAKINS (1/2 STRENGTH) 0.25 % EX SOLN: CUTANEOUS | Status: DC

## 2015-11-13 ENCOUNTER — Telehealth: Payer: Self-pay | Admitting: Radiation Oncology

## 2015-11-13 NOTE — Telephone Encounter (Signed)
Received message from patient's sister, Shauna Hugh, requesting a return call. Phoned [name]. She request that [name] give her sister a blood transfusion to "help get rid of her cancer." Sister states, "I had cancer as a child, the doctor gave me a transfusion and now I am 74 and healthy." Gently attempted to help the sister understand the extent of her sister's disease. [name] adamant still that a transfusion and prayer will heal her sister. Contact Loren Racer and [employer] with concerns. [name] mentioned she had also phoned Dr. Casper [place] office to discuss this matter and is awaiting a return call.

## 2015-11-13 NOTE — Telephone Encounter (Signed)
Placed completed [facility] referral form on Dr. Johny Shears desk to sign. Sister, Shauna Hugh, aware this referral will be made and is in agreement.

## 2015-11-14 ENCOUNTER — Encounter: Payer: Self-pay | Admitting: *Deleted

## 2015-11-14 NOTE — Progress Notes (Signed)
[place] Work  Clinical Social Work was referred by nurse for re-assessment of psychosocial needs due to caregiver concerns.  Clinical Social Worker contacted both pt's husband, Mary [name] and her sister, Mary [name] via phone to check in. Per Mary [name], she feels pt could be "cured by a miracle and they request every one to keep praying". Sister is a CNA, but appears to have limited understanding of severity of pt's illness at this time. She reports pt receives care from husband when she wakes up until CNA arrives at 10am. Pt is getting CNA for 3 hours a day, but sister feels this is not currently enough. She reports she arrives at 1245pm and stays with pt until 830pm when husband returns from work. She reports she gets pt ready for bed and it sounds as if pt is a 2 assist. Per husband, pt cannot currently get in and out of the car safely and could use a wheelchair in the home. He would like pt to have consult for PT/OT in the home as well. PT signed off in the hospital as pt could not participate.   Family reports [facility] has RN coming for wound care Mondays and Thursdays and wound is improving greatly. CSW educated husband and sister that medicaid could send wheelchair Lucianne Lei to take her to appts as ambulance transport is not medically necessary and will not be covered by insurance.  Husband states pt cannot make transfer to car safely. Husband planned to call [facility] transportation for assistance and was provided with number.  Family open to referral to [facility] through [facility]. Husband eluded that pt may need to go to facility for rehab, but CSW reminded him that pt had to be capable of doing PT to go. Family appears open to additional help and assistance. CSW to follow.    Clinical Social Work interventions: Supportive listening Resource education Loren Racer, [place] Worker [place]  [place] Phone: [phone] Fax: [phone]

## 2015-11-15 ENCOUNTER — Ambulatory Visit (HOSPITAL_BASED_OUTPATIENT_CLINIC_OR_DEPARTMENT_OTHER): Payer: Medicaid Other | Admitting: Hematology & Oncology

## 2015-11-15 ENCOUNTER — Telehealth: Payer: Self-pay | Admitting: Hematology & Oncology

## 2015-11-15 ENCOUNTER — Other Ambulatory Visit: Payer: Medicaid Other

## 2015-11-15 ENCOUNTER — Other Ambulatory Visit (HOSPITAL_BASED_OUTPATIENT_CLINIC_OR_DEPARTMENT_OTHER): Payer: Medicaid Other

## 2015-11-15 ENCOUNTER — Ambulatory Visit (HOSPITAL_COMMUNITY)
Admission: RE | Admit: 2015-11-15 | Discharge: 2015-11-15 | Disposition: A | Discharge status: Home / Self Care | Payer: Medicaid Other | Source: Ambulatory Visit | Attending: Hematology & Oncology | Admitting: Hematology & Oncology

## 2015-11-15 ENCOUNTER — Encounter: Payer: Self-pay | Admitting: Hematology & Oncology

## 2015-11-15 ENCOUNTER — Ambulatory Visit (HOSPITAL_BASED_OUTPATIENT_CLINIC_OR_DEPARTMENT_OTHER): Payer: Medicaid Other

## 2015-11-15 ENCOUNTER — Ambulatory Visit: Payer: Self-pay | Admitting: Hematology & Oncology

## 2015-11-15 VITALS — BP 108/70 | HR 104 | Temp 97.5°F | Resp 18 | Ht 65.0 in

## 2015-11-15 DIAGNOSIS — C50919 Malignant neoplasm of unspecified site of unspecified female breast: Secondary | ICD-10-CM

## 2015-11-15 DIAGNOSIS — K130 Diseases of lips: Secondary | ICD-10-CM

## 2015-11-15 DIAGNOSIS — C50911 Malignant neoplasm of unspecified site of right female breast: Secondary | ICD-10-CM

## 2015-11-15 DIAGNOSIS — C50912 Malignant neoplasm of unspecified site of left female breast: Secondary | ICD-10-CM

## 2015-11-15 LAB — CBC WITH DIFFERENTIAL (CANCER CENTER ONLY)
BASO#: 0 10*3/uL (ref 0.0–0.2)
BASO%: 0.1 % (ref 0.0–2.0)
EOS%: 0.1 % (ref 0.0–7.0)
Eosinophils Absolute: 0 10*3/uL (ref 0.0–0.5)
HCT: 23.8 % — ABNORMAL LOW (ref 34.8–46.6)
HGB: 7.7 g/dL — ABNORMAL LOW (ref 11.6–15.9)
LYMPH#: 0.8 10*3/uL — ABNORMAL LOW (ref 0.9–3.3)
LYMPH%: 11 % — AB (ref 14.0–48.0)
MCH: 34.5 pg — AB (ref 26.0–34.0)
MCHC: 32.4 g/dL (ref 32.0–36.0)
MCV: 107 fL — ABNORMAL HIGH (ref 81–101)
MONO#: 0.4 10*3/uL (ref 0.1–0.9)
MONO%: 5.2 % (ref 0.0–13.0)
NEUT%: 83.6 % — AB (ref 39.6–80.0)
NEUTROS ABS: 6.2 10*3/uL (ref 1.5–6.5)
PLATELETS: 199 10*3/uL (ref 145–400)
RBC: 2.23 10*6/uL — ABNORMAL LOW (ref 3.70–5.32)
RDW: 19.2 % — AB (ref 11.1–15.7)
WBC: 7.4 10*3/uL (ref 3.9–10.0)

## 2015-11-15 LAB — PREPARE RBC (CROSSMATCH)

## 2015-11-15 LAB — TECHNOLOGIST REVIEW CHCC SATELLITE

## 2015-11-15 LAB — LACTATE DEHYDROGENASE (CC13): LDH: 483 U/L — ABNORMAL HIGH (ref 125–245)

## 2015-11-15 LAB — HOLD TUBE, BLOOD BANK - CHCC SATELLITE

## 2015-11-15 LAB — ABO/RH: ABO/RH(D): A POS

## 2015-11-15 MED ORDER — HEPARIN SOD (PORK) LOCK FLUSH 100 UNIT/ML IV SOLN
500.0000 [IU] | Freq: Once | INTRAVENOUS | Status: DC
  Filled 2015-11-15: qty 5

## 2015-11-15 MED ORDER — MOMETASONE FUROATE 0.1 % EX CREA: TOPICAL_CREAM | CUTANEOUS | Status: AC

## 2015-11-15 MED ORDER — SODIUM CHLORIDE 0.9 % IJ SOLN
10.0000 mL | INTRAMUSCULAR | Status: DC | PRN
  Administered 2015-11-15: 10 mL via INTRAVENOUS
  Filled 2015-11-15: qty 10

## 2015-11-15 MED ORDER — HEPARIN SOD (PORK) LOCK FLUSH 100 UNIT/ML IV SOLN
500.0000 [IU] | Freq: Once | INTRAVENOUS | Status: AC
  Administered 2015-11-15: 500 [IU] via INTRAVENOUS
  Filled 2015-11-15: qty 5

## 2015-11-15 MED ORDER — SODIUM CHLORIDE 0.9 % IJ SOLN
10.0000 mL | INTRAMUSCULAR | Status: DC | PRN
  Filled 2015-11-15: qty 10

## 2015-11-15 MED ORDER — FLUOXYMESTERONE 10 MG PO TABS: 10.0000 mg | ORAL_TABLET | Freq: Two times a day (BID) | ORAL | Status: DC

## 2015-11-15 NOTE — Telephone Encounter (Signed)
Patient's husband called and cx wife's appt for today.  When he realized she was scheduled to see MD for 34mins, he asked to put her back on schedule.  I resch patient for the original appt.  Husband stated he will bring patient to appt today if all possible.

## 2015-11-15 NOTE — Progress Notes (Signed)
Hematology and Oncology Follow Up Visit  Mary [name] [id number] 25-May-1956 59 y.o. 11/15/2015   Principle Diagnosis:  Metastatic breast cancer- Triple positive  Current Therapy:   Halotestin 65m po BID    Interim History: Mary [name] here today with her husband for a follow-up. Her decline continues. She was just hospitalized once again. She has continued progression of her disease. She has brain metastases that have been maximally radiated.  She was hospitalized because of a right breast wound from her malignancy. I saw her in the hospital. I told her that there really was very little else that we can do for her. She had been on Aromasin/Ibrance. This was not working. Her CA 27.29 was increasing.  We got palliative care involved.  Unfortunately, the family just will not except the fact that she is not going to survive this. They do have a very strong faith. I appreciate this faith but this faith also is hindering them from excepting the fact that she will not survive more than 1 month.  Several doctors have talked to the family. They all have the same opinion that "God will heal her".  Thank you, she is not hurting that much. She is hurting a little bit on her bottom. She really does not walk. She sits and lies down a lot. I did go ahead and give her a cushion to use for her bottom. Hopefully this will help a little bit.  His heart assay how much she is eating.  When she came into the office, she is in a wheelchair. She is somewhat lethargic. She really did not talk that much. She gave one or 2 word answers.  She's had no problems with bleeding. She is had no diarrhea.  She has a performance status of ECOG 3-4 at best.  Thankfully, she has not had any seizures. She has extensive brain metastasis.  Her husband is trying to do as much as he can with her at home. He really is quite loyal to her and just wants her to do "stay" as long as possible.    Medications:      Medication List       This list is accurate as of: 11/15/15  5:21 PM.  Always use your most recent med list.               ACCU-CHEK AVIVA PLUS test strip  Generic drug:  glucose blood  USE TO CHECK BLOOD GLUCOSE 4 TIMES DAILY AFTER MEALS AND AT BEDTIME     ACCU-CHEK SOFTCLIX LANCETS lancets  USE TO CHECK BLOOD GLUCOSE 4 TIMES DAILY     clindamycin 300 MG capsule  Commonly known as:  CLEOCIN  Take 1 capsule (300 mg total) by mouth every 8 (eight) hours.     dexamethasone 2 MG tablet  Commonly known as:  DECADRON  Take 1 tablet (2 mg total) by mouth every 12 (twelve) hours.     feeding supplement (ENSURE ENLIVE) Liqd  Take 237 mLs by mouth 2 (two) times daily between meals.     fluoxymesterone 10 MG tablet  Commonly known as:  HALOTESTIN  Take 1 tablet (10 mg total) by mouth 2 (two) times daily.     gabapentin 300 MG capsule  Commonly known as:  NEURONTIN  Take 1 capsule (300 mg total) by mouth 3 (three) times daily.     glucose monitoring kit monitoring kit  1 each by Does not apply route 4 (four) times daily -  after meals and at bedtime. 1 month Diabetic Testing Supplies for QAC-QHS accuchecks.     ACCU-CHEK AVIVA PLUS W/DEVICE Kit  USE TO CHECK BLOOD GLUCOSE 4 TIMES DAILY     insulin aspart 100 UNIT/ML FlexPen  Commonly known as:  NOVOLOG FLEXPEN  0-9 Units, Subcutaneous, 3 times daily with meals CBG < 70: call MD CBG 70 - 120: 0 units CBG 121 - 150: 1 unit CBG 151 - 200: 2 units CBG 201 - 250: 3 units CBG 251 - 300: 5 units CBG 301 - 350: 7 units CBG 351 - 400: 9 units CBG > 400: call MD  Okay to substitute with different band at equivalent dosing     insulin glargine 100 UNIT/ML injection  Commonly known as:  LANTUS  Inject 0.3 mLs (30 Units total) into the skin daily at 10 pm.     Insulin Pen Needle 32G X 8 MM Misc  Please provide 2 month supply. Okay to substitute with different brand     levETIRAcetam 500 MG tablet  Commonly known as:  KEPPRA  Take 1  tablet (500 mg total) by mouth 2 (two) times daily.     mometasone 0.1 % cream  Commonly known as:  ELOCON  Apply to lips 2x a day     nystatin 100000 UNIT/ML suspension  Commonly known as:  MYCOSTATIN  Take 5 mLs (500,000 Units total) by mouth 4 (four) times daily as needed (mouth pain).     ondansetron 8 MG tablet  Commonly known as:  ZOFRAN  Take 1 tablet (8 mg total) by mouth 2 (two) times daily. Start the day after chemo for 3 days. Then take as needed for nausea or vomiting.     oxyCODONE 5 MG immediate release tablet  Commonly known as:  Oxy IR/ROXICODONE  Take 1-3 tablets (5-15 mg total) by mouth every 6 (six) hours as needed for severe pain. Take 1-2 if needed for pain due to cancer.     pantoprazole 40 MG tablet  Commonly known as:  PROTONIX  Take 1 tablet (40 mg total) by mouth 2 (two) times daily.     potassium chloride SA 20 MEQ tablet  Commonly known as:  K-DUR,KLOR-CON  Take 1 tablet (20 mEq total) by mouth daily.     sodium hypochlorite external solution  Commonly known as:  DAKIN'S 1/2 STRENGTH  Use with each dressing change     tobramycin 0.3 % ophthalmic solution  Commonly known as:  TOBREX  Place 2 drops into the right eye every 6 (six) hours.        Allergies:  Allergies  Allergen Reactions  . Hydrocodone     "makes me crawl on the floor"  . Penicillins Swelling    Per pt's sister, swelling of throat  . Aspirin Other (See Comments)    Due to ulcers    Past Medical History, Surgical history, Social history, and Family History were reviewed and updated.  Review of Systems: All other 10 point review of systems is negative.   Physical Exam:  height is $RemoveB'5\' 5"'HCswoYvG$  (1.651 m). Her temperature is 97.5 F (36.4 C). Her blood pressure is 108/70 and her pulse is 104. Her respiration is 18.   Wt Readings from Last 3 Encounters:  11/08/15 158 lb 4.6 oz (71.8 kg)  10/31/15 152 lb 14.4 oz (69.355 kg)  10/06/15 153 lb 3.5 oz (69.5 kg)    Chronically  ill-appearing African-American female. She is quite lethargic. Head and neck  exam shows no ocular or oral lesions. There are no palpable cervical or supraclavicular lymph nodes. Lungs are with decent breath sounds bilaterally. Cardiac exam regular rate and rhythm with no murmurs, rubs or bruits. Abdomen is soft. She has decent bowel sounds. She has no fluid wave. There is no palpable liver or spleen tip. Breast exam shows the mass in the lateral aspect of the right breast. It is dressed. The mass is quite firm. Extremities shows no clubbing, cyanosis or edema. Neurological exam shows no focal neurological deficits. Skin exam shows some slightly dry skin.    Lab Results  Component Value Date   WBC 7.4 11/15/2015   HGB 7.7* 11/15/2015   HCT 23.8* 11/15/2015   MCV 107* 11/15/2015   PLT 199 11/15/2015   Lab Results  Component Value Date   FERRITIN 151 02/21/2015   IRON 47 02/21/2015   TIBC 398 02/21/2015   UIBC 351 02/21/2015   IRONPCTSAT 12* 02/21/2015   Lab Results  Component Value Date   RBC 2.23* 11/15/2015   No results found for: KPAFRELGTCHN, LAMBDASER, KAPLAMBRATIO No results found for: IGGSERUM, IGA, IGMSERUM No results found for: Odetta Pink, SPEI   Chemistry      Component Value Date/Time   NA 136 11/15/2015 1307   NA 138 11/10/2015 0500   NA 136 11/01/2015 1328   K 3.7 11/15/2015 1307   K 3.3* 11/10/2015 0500   K 3.5 11/01/2015 1328   CL 107 11/10/2015 0500   CL 102 11/01/2015 1328   CO2 22 11/15/2015 1307   CO2 25 11/10/2015 0500   CO2 22 11/01/2015 1328   BUN 13.4 11/15/2015 1307   BUN 12 11/10/2015 0500   BUN 19 11/01/2015 1328   CREATININE 0.6 11/15/2015 1307   CREATININE 0.34* 11/10/2015 0500   CREATININE 0.4* 11/01/2015 1328      Component Value Date/Time   CALCIUM 8.2* 11/15/2015 1307   CALCIUM 8.0* 11/10/2015 0500   CALCIUM 8.4 11/01/2015 1328   ALKPHOS 215* 11/15/2015 1307   ALKPHOS 182*  11/10/2015 0500   ALKPHOS 272* 11/01/2015 1328   AST 24 11/15/2015 1307   AST 34 11/10/2015 0500   AST 82* 11/01/2015 1328   ALT 35 11/15/2015 1307   ALT 53 11/10/2015 0500   ALT 272* 11/01/2015 1328   BILITOT 1.27* 11/15/2015 1307   BILITOT 1.2 11/10/2015 0500   BILITOT 1.70* 11/01/2015 1328     Impression and Plan: Mary [name] is a 59 year old African-American female with metastatic breast cancer. She has been through all effective lines of therapy in mild opinion. I have talked to her husband at length about her condition. I taught him in the hospital. He just was not willing to accept the fact that there is nothing else that can be done for her.  I told that we can try Halotestin. This is for hormone responsive breast cancer. This would be the only thing that I could consider for therapy for her. She should be able to tolerate it. I think the chance of it helping probably is currently less than 10%. I told her husband this. He wants to try it.  She also will be set up for a transfusion. Her sister seems to think that a transfusion will be able to heal her.  I still told her husband that I thought that she was not going to make it to the end of the year. I just would hate to see her be  kept alive on machines. If she were to go onto life-support, she would never come off as she would be way too weak.  I spent over 45 minutes with Mary [name] and her husband. I have was like Mary [name]. She is a was done everything that we have asked for her to do. This is just the end result of her cancer. She now been dealing with metastatic breast cancer for over one and half years. She really has done well considering the tough shape that she was in we first saw her.  We will get her transfused next week. We'll then have her come back couple weeks later and see how she is feeling.    Volanda Napoleon, MD 11/18/20165:21 PM

## 2015-11-15 NOTE — Patient Instructions (Signed)

## 2015-11-16 LAB — CANCER ANTIGEN 27.29: CA 27.29: 161 U/mL — AB (ref 0–39)

## 2015-11-18 ENCOUNTER — Ambulatory Visit (HOSPITAL_BASED_OUTPATIENT_CLINIC_OR_DEPARTMENT_OTHER): Payer: Medicaid Other

## 2015-11-18 VITALS — BP 88/65 | HR 90 | Temp 98.4°F | Resp 20

## 2015-11-18 DIAGNOSIS — D649 Anemia, unspecified: Secondary | ICD-10-CM | POA: Diagnosis present

## 2015-11-18 DIAGNOSIS — C50919 Malignant neoplasm of unspecified site of unspecified female breast: Secondary | ICD-10-CM | POA: Diagnosis not present

## 2015-11-18 MED ORDER — SODIUM CHLORIDE 0.9 % IJ SOLN
10.0000 mL | INTRAMUSCULAR | Status: AC | PRN
  Administered 2015-11-18: 10 mL
  Filled 2015-11-18: qty 10

## 2015-11-18 MED ORDER — FUROSEMIDE 10 MG/ML IJ SOLN
20.0000 mg | Freq: Once | INTRAMUSCULAR | Status: AC
  Administered 2015-11-18: 20 mg via INTRAVENOUS

## 2015-11-18 MED ORDER — SODIUM CHLORIDE 0.9 % IV SOLN
250.0000 mL | Freq: Once | INTRAVENOUS | Status: AC
  Administered 2015-11-18: 250 mL via INTRAVENOUS

## 2015-11-18 MED ORDER — ACETAMINOPHEN 325 MG PO TABS
650.0000 mg | ORAL_TABLET | Freq: Once | ORAL | Status: AC
  Administered 2015-11-18: 650 mg via ORAL

## 2015-11-18 MED ORDER — FUROSEMIDE 10 MG/ML IJ SOLN
INTRAMUSCULAR | Status: AC
  Filled 2015-11-18: qty 4

## 2015-11-18 MED ORDER — HEPARIN SOD (PORK) LOCK FLUSH 100 UNIT/ML IV SOLN
500.0000 [IU] | Freq: Every day | INTRAVENOUS | Status: AC | PRN
  Administered 2015-11-18: 500 [IU]
  Filled 2015-11-18: qty 5

## 2015-11-18 MED ORDER — ACETAMINOPHEN 325 MG PO TABS
ORAL_TABLET | ORAL | Status: AC
  Filled 2015-11-18: qty 2

## 2015-11-18 NOTE — Patient Instructions (Signed)

## 2015-11-18 NOTE — Progress Notes (Signed)
[place] via [employer]. Report given to driver.

## 2015-11-19 ENCOUNTER — Telehealth: Payer: Self-pay | Admitting: Radiation Oncology

## 2015-11-19 LAB — TYPE AND SCREEN
ABO/RH(D): A POS
Antibody Screen: NEGATIVE
Unit division: 0
Unit division: 0

## 2015-11-19 NOTE — Telephone Encounter (Signed)
Patient's sister, Herbert Deaner, left message requesting return call. No answer. Left message requesting return call.

## 2015-11-19 NOTE — Telephone Encounter (Signed)
Received telephone message from Aaron Edelman, RN for Mary [name] requesting a verbal order for social work. She expressed concern the patient isn't getting the care she needs in her home. Returned call. No answer. Left message with verbal order per [name].

## 2015-11-23 NOTE — Progress Notes (Signed)
  Radiation Oncology         [phone] ________________________________  Name: Mary [name] MRN: [id number]  Date: 10/31/2015  DOB: 24-Mar-1956  End of Treatment Note  ICD-9-CM ICD-10-CM     1. over 91 brain metastases, several showing hemorrhage 198.3 C79.31     DIAGNOSIS: 59 yo woman with recurrent diffuse symptomatic brain mets from metastatic breast cancer     Indication for treatment:  Palliation       Radiation treatment dates:   10/16/2015-10/31/2015  Site/dose:  Whole brain was treated to 30 Gy in 12 fractions of 2.5 Gy.   Beams/energy:   Right and Left radiation fields were treated using 6 MV X-rays with custom MLC collimation to shield the eyes and face.  The patient was immobilized with a thermoplastic mask and isocenter was verified with weekly port films.  Narrative: The patient tolerated radiation treatment relatively well.  Her most significant challenge appeared to be related to her social support system. She had some difficulty maintaining her medication regimen including steroids. The patient missed a few treatments but ultimately completed the prescribed regimen as planned.   Plan: The patient has completed radiation treatment. The patient will return to radiation oncology clinic for routine followup in one month. I advised her to call or return sooner if she has any questions or concerns related to her recovery or treatment. ________________________________  Sheral Apley. Tammi Klippel, M.D.  This document serves as a record of services personally performed by Tyler Pita, MD. It was created on his behalf by Arlyce Harman, a trained medical scribe. The creation of this record is based on the scribe's personal observations and the provider's statements to them. This document has been checked and approved by the attending provider.

## 2015-11-27 ENCOUNTER — Telehealth: Payer: Self-pay | Admitting: Radiation Oncology

## 2015-11-27 NOTE — Telephone Encounter (Signed)
Faxed signed FL2 form to [phone]. Confirmation fax of delivery obtained.

## 2015-11-29 ENCOUNTER — Emergency Department (HOSPITAL_COMMUNITY): Payer: Medicaid Other

## 2015-11-29 ENCOUNTER — Encounter (HOSPITAL_COMMUNITY): Payer: Self-pay | Admitting: Emergency Medicine

## 2015-11-29 ENCOUNTER — Emergency Department (HOSPITAL_COMMUNITY)
Admission: EM | Admit: 2015-11-29 | Discharge: 2015-11-29 | Disposition: A | Discharge status: Home / Self Care | Payer: Medicaid Other | Attending: Emergency Medicine | Admitting: Emergency Medicine

## 2015-11-29 DIAGNOSIS — Z794 Long term (current) use of insulin: Secondary | ICD-10-CM | POA: Diagnosis not present

## 2015-11-29 DIAGNOSIS — R109 Unspecified abdominal pain: Secondary | ICD-10-CM | POA: Diagnosis present

## 2015-11-29 DIAGNOSIS — I10 Essential (primary) hypertension: Secondary | ICD-10-CM | POA: Diagnosis not present

## 2015-11-29 DIAGNOSIS — Z8719 Personal history of other diseases of the digestive system: Secondary | ICD-10-CM | POA: Diagnosis not present

## 2015-11-29 DIAGNOSIS — R531 Weakness: Secondary | ICD-10-CM | POA: Insufficient documentation

## 2015-11-29 DIAGNOSIS — N390 Urinary tract infection, site not specified: Secondary | ICD-10-CM | POA: Insufficient documentation

## 2015-11-29 DIAGNOSIS — C50919 Malignant neoplasm of unspecified site of unspecified female breast: Secondary | ICD-10-CM | POA: Diagnosis not present

## 2015-11-29 DIAGNOSIS — Z8659 Personal history of other mental and behavioral disorders: Secondary | ICD-10-CM | POA: Insufficient documentation

## 2015-11-29 DIAGNOSIS — Z8739 Personal history of other diseases of the musculoskeletal system and connective tissue: Secondary | ICD-10-CM | POA: Diagnosis not present

## 2015-11-29 DIAGNOSIS — C7931 Secondary malignant neoplasm of brain: Secondary | ICD-10-CM | POA: Insufficient documentation

## 2015-11-29 DIAGNOSIS — K5641 Fecal impaction: Secondary | ICD-10-CM | POA: Diagnosis not present

## 2015-11-29 DIAGNOSIS — G43909 Migraine, unspecified, not intractable, without status migrainosus: Secondary | ICD-10-CM | POA: Diagnosis not present

## 2015-11-29 DIAGNOSIS — Z79899 Other long term (current) drug therapy: Secondary | ICD-10-CM | POA: Diagnosis not present

## 2015-11-29 LAB — COMPREHENSIVE METABOLIC PANEL
ALBUMIN: 2.1 g/dL — AB (ref 3.5–5.0)
ALK PHOS: 121 U/L (ref 38–126)
ALT: 31 U/L (ref 14–54)
AST: 36 U/L (ref 15–41)
Anion gap: 8 (ref 5–15)
BUN: 10 mg/dL (ref 6–20)
CALCIUM: 8.5 mg/dL — AB (ref 8.9–10.3)
CO2: 28 mmol/L (ref 22–32)
CREATININE: 0.55 mg/dL (ref 0.44–1.00)
Chloride: 105 mmol/L (ref 101–111)
GFR calc Af Amer: >60 mL/min (ref >60)
GFR calc non Af Amer: >60 mL/min (ref >60)
GLUCOSE: 132 mg/dL — AB (ref 65–99)
Potassium: 3.7 mmol/L (ref 3.5–5.1)
SODIUM: 141 mmol/L (ref 135–145)
Total Bilirubin: 1.2 mg/dL (ref 0.3–1.2)
Total Protein: 5.6 g/dL — ABNORMAL LOW (ref 6.5–8.1)

## 2015-11-29 LAB — URINALYSIS, ROUTINE W REFLEX MICROSCOPIC
Glucose, UA: NEGATIVE mg/dL
KETONES UR: NEGATIVE mg/dL
Nitrite: NEGATIVE
PROTEIN: 100 mg/dL — AB
Specific Gravity, Urine: 1.022 (ref 1.005–1.030)
pH: 8 (ref 5.0–8.0)

## 2015-11-29 LAB — CBC WITH DIFFERENTIAL/PLATELET
BASOS ABS: 0 10*3/uL (ref 0.0–0.1)
Basophils Relative: 0 %
EOS PCT: 0 %
Eosinophils Absolute: 0 10*3/uL (ref 0.0–0.7)
HCT: 32.6 % — ABNORMAL LOW (ref 36.0–46.0)
HEMOGLOBIN: 10.4 g/dL — AB (ref 12.0–15.0)
LYMPHS PCT: 15 %
Lymphs Abs: 1.1 10*3/uL (ref 0.7–4.0)
MCH: 31.8 pg (ref 26.0–34.0)
MCHC: 31.9 g/dL (ref 30.0–36.0)
MCV: 99.7 fL (ref 78.0–100.0)
MONOS PCT: 9 %
Monocytes Absolute: 0.6 10*3/uL (ref 0.1–1.0)
NEUTROS ABS: 5.4 10*3/uL (ref 1.7–7.7)
Neutrophils Relative %: 76 %
Platelets: 354 10*3/uL (ref 150–400)
RBC: 3.27 MIL/uL — AB (ref 3.87–5.11)
RDW: 23 % — ABNORMAL HIGH (ref 11.5–15.5)
WBC: 7.1 10*3/uL (ref 4.0–10.5)

## 2015-11-29 LAB — URINE MICROSCOPIC-ADD ON

## 2015-11-29 LAB — LIPASE, BLOOD: Lipase: 18 U/L (ref 11–51)

## 2015-11-29 LAB — CBG MONITORING, ED: GLUCOSE-CAPILLARY: 103 mg/dL — AB (ref 65–99)

## 2015-11-29 LAB — I-STAT CG4 LACTIC ACID, ED: Lactic Acid, Venous: 1.67 mmol/L (ref 0.5–2.0)

## 2015-11-29 MED ORDER — CIPROFLOXACIN HCL 500 MG PO TABS: 500.0000 mg | ORAL_TABLET | Freq: Two times a day (BID) | ORAL | Status: DC

## 2015-11-29 MED ORDER — CIPROFLOXACIN IN D5W 400 MG/200ML IV SOLN
400.0000 mg | Freq: Once | INTRAVENOUS | Status: AC
Start: 2015-11-29 — End: 2015-11-29
  Administered 2015-11-29: 400 mg via INTRAVENOUS
  Filled 2015-11-29: qty 200

## 2015-11-29 MED ORDER — SODIUM CHLORIDE 0.9 % IV BOLUS (SEPSIS)
1000.0000 mL | Freq: Once | INTRAVENOUS | Status: AC
  Administered 2015-11-29: 1000 mL via INTRAVENOUS

## 2015-11-29 MED ORDER — MILK AND MOLASSES ENEMA
1.0000 | Freq: Once | RECTAL | Status: AC
  Administered 2015-11-29: 250 mL via RECTAL
  Filled 2015-11-29: qty 250

## 2015-11-29 NOTE — ED Notes (Signed)
Foley clamped for specimen on coming staff aware

## 2015-11-29 NOTE — Discharge Instructions (Signed)
Take Cipro as prescribed until all gone for urinary tract infection. Continue all other regular medications. Take Miralax for constipation daily. Please follow with primary care doctor for close recheck. Return if worsening symptoms  Fecal Impaction A fecal impaction happens when there is a large, firm amount of stool (or feces) that cannot be passed. The impacted stool is usually in the rectum, which is the lowest part of the large bowel. The impacted stool can block the colon and cause significant problems. CAUSES  The longer stool stays in the rectum, the harder it gets. Anything that slows down your bowel movements can lead to fecal impaction, such as:  Constipation. This can be a long-standing (chronic) problem or can happen suddenly (acute).  Painful conditions of the rectum, such as hemorrhoids or anal fissures. The pain of these conditions can make you try to avoid having bowel movements.  Narcotic pain-relieving medicines, such as methadone, morphine, or codeine.  Not drinking enough fluids.  Inactivity and bed rest over long periods of time.  Diseases of the brain or nervous system that damage the nerves controlling the muscles of the intestines. SIGNS AND SYMPTOMS   Lack of normal bowel movements or changes in bowel patterns.  Sense of fullness in the rectum but unable to pass stool.  Pain or cramps in the abdominal area (often after meals).  Thin, watery discharge from the rectum. DIAGNOSIS  Your health care provider may suspect that you have a fecal impaction based on your symptoms and a physical exam. This will include an exam of your rectum. Sometimes X-rays or lab testing may be needed to confirm the diagnosis and to be sure there are no other problems.  TREATMENT   Initially an impaction can be removed manually. Using a gloved finger, your health care provider can remove hard stool from your rectum.  Medicine is sometimes needed. A suppository or enema can be given  in the rectum to soften the stool, which can stimulate a bowel movement. Medicines can also be given by mouth (orally).  Though rare, surgery may be needed if the colon has torn (perforated) due to blockage. HOME CARE INSTRUCTIONS   Develop regular bowel habits. This could include getting in the habit of having a bowel movement after your morning cup of coffee or after eating. Be sure to allow yourself enough time on the toilet.  Maintain a high-fiber diet.  Drink enough fluids to keep your urine clear or pale yellow as directed by your health care provider.  Exercise regularly.  If you begin to get constipated, increase the amount of fiber in your diet. Eat plenty of fruits, vegetables, whole wheat breads, bran, oatmeal, and similar products.  Take natural fiber laxatives or other laxatives only as directed by your health care provider. SEEK MEDICAL CARE IF:   You have ongoing rectal pain.  You require enemas or suppositories more than twice a week.  You have rectal bleeding.  You have continued problems, or you develop abdominal pain.  You have thin, pencil-like stools. SEEK IMMEDIATE MEDICAL CARE IF:  You have black or tarry stools. MAKE SURE YOU:   Understand these instructions.  Will watch your condition.  Will get help right away if you are not doing well or get worse.   This information is not intended to replace advice given to you by your health care provider. Make sure you discuss any questions you have with your health care provider.   Document Released: 09/05/2004 Document Revised: 10/04/2013 Document  Reviewed: 06/20/2013 Elsevier Interactive Patient Education 2016 Elsevier Inc.  Urinary Tract Infection Urinary tract infections (UTIs) can develop anywhere along your urinary tract. Your urinary tract is your body's drainage system for removing wastes and extra water. Your urinary tract includes two kidneys, two ureters, a bladder, and a urethra. Your kidneys are  a pair of bean-shaped organs. Each kidney is about the size of your fist. They are located below your ribs, one on each side of your spine. CAUSES Infections are caused by microbes, which are microscopic organisms, including fungi, viruses, and bacteria. These organisms are so small that they can only be seen through a microscope. Bacteria are the microbes that most commonly cause UTIs. SYMPTOMS  Symptoms of UTIs may vary by age and gender of the patient and by the location of the infection. Symptoms in young women typically include a frequent and intense urge to urinate and a painful, burning feeling in the bladder or urethra during urination. Older women and men are more likely to be tired, shaky, and weak and have muscle aches and abdominal pain. A fever may mean the infection is in your kidneys. Other symptoms of a kidney infection include pain in your back or sides below the ribs, nausea, and vomiting. DIAGNOSIS To diagnose a UTI, your caregiver will ask you about your symptoms. Your caregiver will also ask you to provide a urine sample. The urine sample will be tested for bacteria and white blood cells. White blood cells are made by your body to help fight infection. TREATMENT  Typically, UTIs can be treated with medication. Because most UTIs are caused by a bacterial infection, they usually can be treated with the use of antibiotics. The choice of antibiotic and length of treatment depend on your symptoms and the type of bacteria causing your infection. HOME CARE INSTRUCTIONS  If you were prescribed antibiotics, take them exactly as your caregiver instructs you. Finish the medication even if you feel better after you have only taken some of the medication.  Drink enough water and fluids to keep your urine clear or pale yellow.  Avoid caffeine, tea, and carbonated beverages. They tend to irritate your bladder.  Empty your bladder often. Avoid holding urine for long periods of time.  Empty  your bladder before and after sexual intercourse.  After a bowel movement, women should cleanse from front to back. Use each tissue only once. SEEK MEDICAL CARE IF:   You have back pain.  You develop a fever.  Your symptoms do not begin to resolve within 3 days. SEEK IMMEDIATE MEDICAL CARE IF:   You have severe back pain or lower abdominal pain.  You develop chills.  You have nausea or vomiting.  You have continued burning or discomfort with urination. MAKE SURE YOU:   Understand these instructions.  Will watch your condition.  Will get help right away if you are not doing well or get worse.   This information is not intended to replace advice given to you by your health care provider. Make sure you discuss any questions you have with your health care provider.   Document Released: 09/23/2005 Document Revised: 09/04/2015 Document Reviewed: 01/22/2012 Elsevier Interactive Patient Education [employer].

## 2015-11-29 NOTE — ED Provider Notes (Signed)
CSN: [id number]     Arrival date & time 11/29/15  0548 History   First MD Initiated Contact with Patient 11/29/15 [phone]     Chief Complaint  Patient presents with  . Abdominal Pain     (Consider location/radiation/quality/duration/timing/severity/associated sxs/prior Treatment) HPI Mary [name] is a 59 y.o. female with hx metastatic breast cancer with multiple sites of metastases including the brain, presents to emergency department complaining of abdominal pain. Patient has an indwelling Foley, states approximately 3 days ago she has had decreased output in the Foley and increased abdominal pain. She felt like her Foley is not draining properly. Home health nurse changed her Foley 2 days ago. She felt like that helped some, however pain and pressure came back yesterday. She again noticed that the Foley is not draining as much. It is still putting out some urine. Husband denies any fever or chills. There has not been any nausea or vomiting. Patient has had very small hard stools. The medications tried for the treatment. =  Past Medical History  Diagnosis Date  . Arthritis   . Depression   . Fainting   . Headache   . Hypertension   . Migraines   . History of blood transfusion   . Gastric ulcer   . Breast cancer metastasized to multiple sites [facility]) 04/12/2014    10/30/15 radiation, chemotherapy  . Seizures [facility])    Past Surgical History  Procedure Laterality Date  . Abdominal hysterectomy  15 years go    partial   Family History  Problem Relation Age of Onset  . Arthritis Other   . Hypertension Mother   . Hypertension Other   . Heart disease Other   . Stroke Father   . Cancer Sister     leukemia   Social History  Substance Use Topics  . Smoking status: Never Smoker   . Smokeless tobacco: Never Used     Comment: never used tobacco  . Alcohol Use: No   OB History    No data available     Review of Systems  Constitutional: Positive for fatigue. Negative for fever and  chills.  Respiratory: Negative for cough, chest tightness and shortness of breath.   Cardiovascular: Negative for chest pain, palpitations and leg swelling.  Gastrointestinal: Positive for abdominal pain. Negative for nausea, vomiting and diarrhea.  Genitourinary: Negative for dysuria and flank pain.  Musculoskeletal: Negative for myalgias, arthralgias, neck pain and neck stiffness.  Skin: Negative for rash.  Neurological: Positive for weakness. Negative for dizziness and headaches.  All other systems reviewed and are negative.     Allergies  Hydrocodone; Penicillins; and Aspirin  Home Medications   Prior to Admission medications   Medication Sig Start Date End Date Taking? Authorizing Provider  fluoxymesterone (HALOTESTIN) 10 MG tablet Take 1 tablet (10 mg total) by mouth 2 (two) times daily. 11/15/15  Yes Volanda Napoleon, MD  ACCU-CHEK AVIVA PLUS test strip USE TO CHECK BLOOD GLUCOSE 4 TIMES DAILY AFTER MEALS AND AT BEDTIME 10/10/15   Historical Provider, MD  ACCU-CHEK SOFTCLIX LANCETS lancets USE TO CHECK BLOOD GLUCOSE 4 TIMES DAILY 10/10/15   Historical Provider, MD  Blood Glucose Monitoring Suppl (ACCU-CHEK AVIVA PLUS) W/DEVICE KIT USE TO CHECK BLOOD GLUCOSE 4 TIMES DAILY 10/10/15   Historical Provider, MD  clindamycin (CLEOCIN) 300 MG capsule Take 1 capsule (300 mg total) by mouth every 8 (eight) hours. 11/10/15   Nita Sells, MD  dexamethasone (DECADRON) 2 MG tablet Take 1 tablet (2  mg total) by mouth every 12 (twelve) hours. 11/10/15   Nita Sells, MD  feeding supplement, ENSURE ENLIVE, (ENSURE ENLIVE) LIQD Take 237 mLs by mouth 2 (two) times daily between meals. 11/10/15   Nita Sells, MD  gabapentin (NEURONTIN) 300 MG capsule Take 1 capsule (300 mg total) by mouth 3 (three) times daily. 08/28/15   Volanda Napoleon, MD  glucose monitoring kit (FREESTYLE) monitoring kit 1 each by Does not apply route 4 (four) times daily - after meals and at bedtime. 1 month  Diabetic Testing Supplies for QAC-QHS accuchecks. 10/09/15   Shanker Kristeen Mans, MD  insulin aspart (NOVOLOG FLEXPEN) 100 UNIT/ML FlexPen 0-9 Units, Subcutaneous, 3 times daily with meals CBG < 70: call MD CBG 70 - 120: 0 units CBG 121 - 150: 1 unit CBG 151 - 200: 2 units CBG 201 - 250: 3 units CBG 251 - 300: 5 units CBG 301 - 350: 7 units CBG 351 - 400: 9 units CBG > 400: call MD  Okay to substitute with different band at equivalent dosing 10/09/15   Shanker Kristeen Mans, MD  insulin glargine (LANTUS) 100 UNIT/ML injection Inject 0.3 mLs (30 Units total) into the skin daily at 10 pm. 11/10/15   Nita Sells, MD  Insulin Pen Needle 32G X 8 MM MISC Please provide 2 month supply. Okay to substitute with different brand 10/09/15   Jonetta Osgood, MD  levETIRAcetam (KEPPRA) 500 MG tablet Take 1 tablet (500 mg total) by mouth 2 (two) times daily. 11/10/15   Nita Sells, MD  mometasone (ELOCON) 0.1 % cream Apply to lips 2x a day 11/15/15   Volanda Napoleon, MD  nystatin (MYCOSTATIN) 100000 UNIT/ML suspension Take 5 mLs (500,000 Units total) by mouth 4 (four) times daily as needed (mouth pain). 11/10/15   Nita Sells, MD  ondansetron (ZOFRAN) 8 MG tablet Take 1 tablet (8 mg total) by mouth 2 (two) times daily. Start the day after chemo for 3 days. Then take as needed for nausea or vomiting. 10/09/15   Shanker Kristeen Mans, MD  oxyCODONE (OXY IR/ROXICODONE) 5 MG immediate release tablet Take 1-3 tablets (5-15 mg total) by mouth every 6 (six) hours as needed for severe pain. Take 1-2 if needed for pain due to cancer. 11/10/15   Nita Sells, MD  pantoprazole (PROTONIX) 40 MG tablet Take 1 tablet (40 mg total) by mouth 2 (two) times daily. 10/09/15   Shanker Kristeen Mans, MD  potassium chloride SA (K-DUR,KLOR-CON) 20 MEQ tablet Take 1 tablet (20 mEq total) by mouth daily. 11/10/15   Nita Sells, MD  sodium hypochlorite (DAKIN'S 1/2 STRENGTH) external solution Use with each  dressing change 11/11/15   Volanda Napoleon, MD  tobramycin (TOBREX) 0.3 % ophthalmic solution Place 2 drops into the right eye every 6 (six) hours. 11/10/15   Nita Sells, MD   BP 138/88 mmHg  Pulse 84  Temp(Src) 97.6 F (36.4 C) (Oral)  Resp 18  SpO2 96% Physical Exam  Constitutional: She is oriented to person, place, and time. She appears well-developed and well-nourished.  Ill-appearing  HENT:  Head: Normocephalic.  Eyes: Conjunctivae are normal.  Neck: Neck supple.  Cardiovascular: Normal rate, regular rhythm and normal heart sounds.   Pulmonary/Chest: Effort normal and breath sounds normal. No respiratory distress. She has no wheezes. She has no rales.  Abdominal: Soft. Bowel sounds are normal. She exhibits no distension. There is tenderness. There is no rebound.  Diffuse tenderness  Genitourinary:  Fecal impaction  Musculoskeletal:  She exhibits no edema.  Neurological: She is alert and oriented to person, place, and time.  Skin: Skin is warm and dry.  Psychiatric: She has a normal mood and affect. Her behavior is normal.  Nursing note and vitals reviewed.   ED Course  Procedures (including critical care time) Labs Review Labs Reviewed  CBC WITH DIFFERENTIAL/PLATELET - Abnormal; Notable for the following:    RBC 3.27 (*)    Hemoglobin 10.4 (*)    HCT 32.6 (*)    RDW 23.0 (*)    All other components within normal limits  COMPREHENSIVE METABOLIC PANEL - Abnormal; Notable for the following:    Glucose, Bld 132 (*)    Calcium 8.5 (*)    Total Protein 5.6 (*)    Albumin 2.1 (*)    All other components within normal limits  URINALYSIS, ROUTINE W REFLEX MICROSCOPIC (NOT AT [facility]) - Abnormal; Notable for the following:    Color, Urine AMBER (*)    APPearance CLOUDY (*)    Hgb urine dipstick TRACE (*)    Bilirubin Urine SMALL (*)    Protein, ur 100 (*)    Leukocytes, UA LARGE (*)    All other components within normal limits  URINE MICROSCOPIC-ADD ON - Abnormal;  Notable for the following:    Squamous Epithelial / LPF 0-5 (*)    Bacteria, UA MANY (*)    Crystals TRIPLE PHOSPHATE CRYSTALS (*)    All other components within normal limits  CBG MONITORING, ED - Abnormal; Notable for the following:    Glucose-Capillary 103 (*)    All other components within normal limits  URINE CULTURE  LIPASE, BLOOD  I-STAT CG4 LACTIC ACID, ED    Imaging Review Dg Abd 2 Views  11/29/2015  CLINICAL DATA:  Abdominal pain. History of metastatic breast cancer. EXAM: ABDOMEN - 2 VIEW COMPARISON:  09/04/2015 FINDINGS: Multiple areas of formed stool distending the colon. There is no evidence of bowel obstruction or perforation. No concerning intra-abdominal mass effect. Known osseous metastases, on this study best seen in the left ninth rib. IMPRESSION: Degree of stool retention suggest constipation. No evidence of bowel obstruction. Electronically Signed   By: Monte Fantasia M.D.   On: 11/29/2015 07:18   I have personally reviewed and evaluated these images and lab results as part of my medical decision-making.   EKG Interpretation None      MDM   Final diagnoses:  Abdominal pain    Patient emergency department with abdominal pain, poorly draining Foley? Foley irrigated by RN, good output. Patient's urine appears to be very concentrated. Patient appears dry. Will start IV fluids. Patient also appears to be impacted. Try to disimpact, patient did not tolerate. We'll try to enema, labs pending.  10:55 AM Patient had several large bowel movements after the enema. She states abdominal pressure has improved. The urine shows infection. Treated with Cipro 400 mg in emergency department. We'll discharge home on Cipro orally. I do not think patient is to be admitted this time, and both patient and husband do not prefer admission. Her vital signs here have been normal. She is afebrile. Normal blood pressure and heart rate. There is no evidence of sepsis. Instructed to follow  with primary doctor closely or return if worsening symptoms. Will add [place]ill asked to her daily routine.  Filed Vitals:   11/29/15 0549 11/29/15 0714 11/29/15 0818  BP: 138/88  129/88  Pulse: 84  76  Temp: 97.6 F (36.4 C) 97.9 F (36.6  C)   TempSrc: Oral Rectal   Resp: 18  16  SpO2: 96%  91%     Jeannett Senior, PA-C 11/29/15 [place], MD 12/01/15 2305

## 2015-11-29 NOTE — ED Notes (Signed)
Milk & molasses enema given - patient passed stool about the size of my hand (size 7 sterile gloves) after enema administration.

## 2015-11-29 NOTE — ED Notes (Signed)
Foley cath draining scant to small amount very cloudy with  white sentiment . Foley irrigated with 50 ml NSS with 50 ml returned flushes easy pt denies increase pain at that time and tolerated overall very well. Foley drainage bag emptied to collect fresh urine for specimen.

## 2015-11-29 NOTE — ED Notes (Signed)
Pt c/o abd pain pt has foley cath in place that has stopped draining and now pt is experiencing low abd pain urine is very cloudy/ Pt also has had poor po intake

## 2015-11-29 NOTE — ED Notes (Signed)
Bed: [id number] Expected date:  Expected time:  Means of arrival:  Comments: EMS 59yo F [place] pt, abd pain and urinary retention

## 2015-11-29 NOTE — ED Notes (Signed)
AVS explained in detail. Knows to follow up with MD Nancy Fetter in 2 days. Also encouraged to eat high fiber foods and drink increased amounts of water. No other c/c. Knows to take entire ciprofloxacin doses.

## 2015-11-29 NOTE — ED Notes (Signed)
Patient resting comfortably after enema.  Husband at bedside.

## 2015-11-29 NOTE — ED Notes (Signed)
Patient transported to X-ray 

## 2015-11-29 NOTE — ED Notes (Addendum)
Patient cleaned up with washcloths and body wash.  Patient more awake and talking on phone with sister-in-law.  Patient requested and received cranberry juice and graham crackers.

## 2015-12-01 LAB — URINE CULTURE

## 2015-12-02 ENCOUNTER — Telehealth (HOSPITAL_COMMUNITY): Payer: Self-pay

## 2015-12-02 NOTE — Telephone Encounter (Signed)
Post ED Visit - Positive Culture Follow-up  Culture report reviewed by antimicrobial stewardship pharmacist:  []  Elenor Quinones, Pharm.D. [x]  Heide Guile, Pharm.D., BCPS []  Parks Neptune, Pharm.D. []  Alycia Rossetti, Pharm.D., BCPS []  [place], Pharm.D., BCPS, AAHIVP []  Legrand Como, Pharm.D., BCPS, AAHIVP []  Milus Glazier, Pharm.D. []  Stephens November, Pharm.D.  Positive urine culture, >/= 100,000 colonies -> Proteus Mirabilis Treated with Ciprofloxacin, organism sensitive to the same and no further patient follow-up is required at this time.  Dortha Kern 12/02/2015, 8:57 AM

## 2015-12-03 ENCOUNTER — Telehealth: Payer: Self-pay | Admitting: Radiation Oncology

## 2015-12-03 NOTE — Telephone Encounter (Signed)
Received a voicemail message from the patient's sister, Soledad Gerlach. Returned call. [name] expresses concern for her sister. She fears her sister isn't receiving adequate care. She goes onto explain that to check her sister into [employer] will cost $6,350 to cover the first thirty days then, medicaid will take over. [name] states, "we don't have that kind of money." Suggested [employer]. [name] says her brother in law "won't go for that." [name] states through tear how exhausted she is and other indicators she is experiencing caregiver role strain. [name] states, "I am upset my brother in law took out life insurance instead of hospital insurance." Listened to Shauna Hugh and attempted to comfort her. [name] understands this RN will reach out to [place], LCSW and one of [place] will be back in touch.

## 2015-12-04 ENCOUNTER — Telehealth: Payer: Self-pay | Admitting: Radiation Oncology

## 2015-12-04 ENCOUNTER — Telehealth: Payer: Self-pay | Admitting: *Deleted

## 2015-12-04 ENCOUNTER — Encounter: Payer: Self-pay | Admitting: *Deleted

## 2015-12-04 NOTE — Telephone Encounter (Signed)
[name], patient's sister, left message requesting return call. Phoned [name] back. She reports that Izellah has allowed [facility] into the home and they are all very please with the care provided. [name] questioned if Carrell should present for one month follow up. Explained that per [name] there was no need to. [name] requested to be contacted 48 hours prior to Saamiya's next appointment/scan to allow time for her to arrange (EMS) transportation. Will forward this finding onto treatment team. [name] expressed understanding of all discuss and will call with any future needs.

## 2015-12-04 NOTE — Telephone Encounter (Signed)
Received call from [place].  They went to house of patient to asses for hospice services.  Questioned "chemo" that patient is taking as stated by family.  Dr. Niel Hummer states the only medication is Halotestin which is not chemotherapy.  He would most likely keep patient on that . [name] to see patient on Friday.

## 2015-12-04 NOTE — Progress Notes (Signed)
[place] Work  Clinical Social Work phoned the pt's home and spoke with sister, Shauna Hugh and Hospice and [place] was there today and has been a "huge help, thank you for sending them". Per sister, they are thrilled to be connected with hospice services and the pt and family agrees to this plan. Sister, denies other needs currently and feels very satisfied and pt is being well cared for, caregiving needs being met as well. Family aware to reach out to this CSW as needs arise.   Loren Racer, [place] Worker [place]  [place] Phone: [phone] Fax: [phone]

## 2015-12-05 ENCOUNTER — Ambulatory Visit: Payer: Medicaid Other | Admitting: Radiation Oncology

## 2015-12-05 ENCOUNTER — Ambulatory Visit
Admission: RE | Admit: 2015-12-05 | Discharge: 2015-12-05 | Disposition: A | Discharge status: Home / Self Care | Payer: Medicaid Other | Source: Ambulatory Visit | Attending: Radiation Oncology | Admitting: Radiation Oncology

## 2015-12-06 ENCOUNTER — Other Ambulatory Visit (HOSPITAL_BASED_OUTPATIENT_CLINIC_OR_DEPARTMENT_OTHER): Payer: Medicaid Other

## 2015-12-06 ENCOUNTER — Ambulatory Visit (HOSPITAL_BASED_OUTPATIENT_CLINIC_OR_DEPARTMENT_OTHER): Payer: Medicaid Other | Admitting: Family

## 2015-12-06 ENCOUNTER — Encounter: Payer: Self-pay | Admitting: Family

## 2015-12-06 ENCOUNTER — Ambulatory Visit (HOSPITAL_BASED_OUTPATIENT_CLINIC_OR_DEPARTMENT_OTHER): Payer: Medicaid Other

## 2015-12-06 VITALS — BP 134/85 | HR 82 | Temp 97.7°F | Resp 98 | Ht 65.0 in

## 2015-12-06 DIAGNOSIS — C7931 Secondary malignant neoplasm of brain: Secondary | ICD-10-CM

## 2015-12-06 DIAGNOSIS — C50919 Malignant neoplasm of unspecified site of unspecified female breast: Secondary | ICD-10-CM

## 2015-12-06 DIAGNOSIS — N644 Mastodynia: Secondary | ICD-10-CM

## 2015-12-06 DIAGNOSIS — K130 Diseases of lips: Secondary | ICD-10-CM

## 2015-12-06 DIAGNOSIS — R109 Unspecified abdominal pain: Secondary | ICD-10-CM | POA: Diagnosis not present

## 2015-12-06 LAB — COMPREHENSIVE METABOLIC PANEL
ALT: 40 U/L (ref 0–55)
ANION GAP: 12 meq/L — AB (ref 3–11)
AST: 45 U/L — ABNORMAL HIGH (ref 5–34)
Albumin: 2.1 g/dL — ABNORMAL LOW (ref 3.5–5.0)
Alkaline Phosphatase: 138 U/L (ref 40–150)
BILIRUBIN TOTAL: 0.6 mg/dL (ref 0.20–1.20)
BUN: 13.9 mg/dL (ref 7.0–26.0)
CALCIUM: 8.3 mg/dL — AB (ref 8.4–10.4)
CHLORIDE: 108 meq/L (ref 98–109)
CO2: 21 mEq/L — ABNORMAL LOW (ref 22–29)
CREATININE: 0.6 mg/dL (ref 0.6–1.1)
EGFR: >90 mL/min/{1.73_m2} (ref >90)
Glucose: 150 mg/dl — ABNORMAL HIGH (ref 70–140)
Potassium: 3.1 mEq/L — ABNORMAL LOW (ref 3.5–5.1)
Sodium: 140 mEq/L (ref 136–145)
Total Protein: 5.1 g/dL — ABNORMAL LOW (ref 6.4–8.3)

## 2015-12-06 LAB — CBC WITH DIFFERENTIAL (CANCER CENTER ONLY)
BASO#: 0 10*3/uL (ref 0.0–0.2)
BASO%: 0.1 % (ref 0.0–2.0)
EOS ABS: 0 10*3/uL (ref 0.0–0.5)
EOS%: 0.1 % (ref 0.0–7.0)
HEMATOCRIT: 32.4 % — AB (ref 34.8–46.6)
HGB: 10 g/dL — ABNORMAL LOW (ref 11.6–15.9)
LYMPH#: 1.4 10*3/uL (ref 0.9–3.3)
LYMPH%: 14.7 % (ref 14.0–48.0)
MCH: 31.8 pg (ref 26.0–34.0)
MCHC: 30.9 g/dL — ABNORMAL LOW (ref 32.0–36.0)
MCV: 103 fL — AB (ref 81–101)
MONO#: 1.1 10*3/uL — AB (ref 0.1–0.9)
MONO%: 12.1 % (ref 0.0–13.0)
NEUT#: 6.8 10*3/uL — ABNORMAL HIGH (ref 1.5–6.5)
NEUT%: 73 % (ref 39.6–80.0)
Platelets: 294 10*3/uL (ref 145–400)
RBC: 3.14 10*6/uL — ABNORMAL LOW (ref 3.70–5.32)
RDW: 23.7 % — AB (ref 11.1–15.7)
WBC: 9.3 10*3/uL (ref 3.9–10.0)

## 2015-12-06 LAB — TECHNOLOGIST REVIEW CHCC SATELLITE

## 2015-12-06 LAB — HOLD TUBE, BLOOD BANK - CHCC SATELLITE

## 2015-12-06 MED ORDER — MORPHINE SULFATE (PF) 4 MG/ML IV SOLN
INTRAVENOUS | Status: AC
Start: 2015-12-06 — End: 2015-12-06
  Filled 2015-12-06: qty 1

## 2015-12-06 MED ORDER — HEPARIN SOD (PORK) LOCK FLUSH 100 UNIT/ML IV SOLN
500.0000 [IU] | Freq: Once | INTRAVENOUS | Status: AC
  Administered 2015-12-06: 500 [IU] via INTRAVENOUS
  Filled 2015-12-06: qty 5

## 2015-12-06 MED ORDER — SODIUM CHLORIDE 0.9 % IJ SOLN
10.0000 mL | INTRAMUSCULAR | Status: DC | PRN
  Administered 2015-12-06: 10 mL via INTRAVENOUS
  Filled 2015-12-06: qty 10

## 2015-12-06 MED ORDER — FENTANYL 25 MCG/HR TD PT72: 25.0000 ug | MEDICATED_PATCH | TRANSDERMAL | Status: DC

## 2015-12-06 MED ORDER — MORPHINE SULFATE 4 MG/ML IJ SOLN
4.0000 mg | Freq: Once | INTRAMUSCULAR | Status: AC
  Administered 2015-12-06: 4 mg via INTRAVENOUS
  Filled 2015-12-06: qty 1

## 2015-12-06 NOTE — Patient Instructions (Signed)
Morphine injection solution What is this medicine? MORPHINE (MOR feen) is a pain reliever. It is used to treat moderate to severe pain. This medicine may be used for other purposes; ask your health care provider or pharmacist if you have questions. What should I tell my health care provider before I take this medicine? They need to know if you have any of these conditions: -brain tumor -drug abuse or addiction -head injury -heart disease -frequently drink alcohol containing drinks -intestinal disease -kidney disease or problems urinating -kyphoscoliosis -liver disease -lung or breathing disease, like asthma -seizures -taken an MAOI like Carbex, Eldepryl, Marplan, Nardil, or Parnate in last 14 days -an unusual or allergic reaction to morphine, other pain medicines, foods, dyes, or preservatives -pregnant or trying to get pregnant -breast-feeding How should I use this medicine? This medicine is for injection into a muscle, vein, or under the skin. It is usually given by a health care professional in a hospital or clinic setting. If you get this medicine at home, you will be taught how to prepare and give this medicine. Use exactly as directed. Take your medicine at regular intervals. Do not take your medicine more often than directed. Always look at your medicine before using it. Do not use the injection if its color is darker than pale yellow or if it is discolored in any other way. Do not use this medicine if it is cloudy, thickened, colored, or has solid particles in it. It is important that you put your used needles and syringes in a special sharps container. Do not put them in a trash can. If you do not have a sharps container, call your pharmacist or healthcare provider to get one. Talk to your pediatrician regarding the use of this medicine in children. Special care may be needed. Overdosage: If you think you have taken too much of this medicine contact a poison control center or  emergency room at once. NOTE: This medicine is only for you. Do not share this medicine with others. What if I miss a dose? If you miss a dose, take it as soon as you can. If it is almost time for your next dose, take only that dose. Do not take double or extra doses. What may interact with this medicine? Do not take this medicine with any of the following medications: -MAOIs like Carbex, Eldepryl, Marplan, Nardil, and Parnate This medicine may also interact with the following medications: -alcohol -antihistamines -barbiturates, like phenobarbital -medicines for depression, anxiety, or psychotic disturbances -medicines for sleep -muscle relaxants -naltrexone, naloxone -narcotic medicines (opiates) for pain -rifampin -tramadol This list may not describe all possible interactions. Give your health care provider a list of all the medicines, herbs, non-prescription drugs, or dietary supplements you use. Also tell them if you smoke, drink alcohol, or use illegal drugs. Some items may interact with your medicine. What should I watch for while using this medicine? Tell your doctor or health care professional if your pain does not go away, if it gets worse, or if you have new or a different type of pain. You may develop tolerance to the medicine. Tolerance means that you will need a higher dose of the medicine for pain relief. Tolerance is normal and is expected if you take this medicine for a long time. Do not suddenly stop taking your medicine because you may develop a severe reaction. Your body becomes used to the medicine. This does NOT mean you are addicted. Addiction is a behavior related to getting  and using a drug for a non-medical reason. If you have pain, you have a medical reason to take pain medicine. Your doctor will tell you how much medicine to take. If your doctor wants you to stop the medicine, the dose will be slowly lowered over time to avoid any side effects. You may get drowsy or  dizzy. Do not drive, use machinery, or do anything that needs mental alertness until you know how this medicine affects you. Do not stand or sit up quickly, especially if you are an older patient. This reduces the risk of dizzy or fainting spells. Alcohol may interfere with the effect of this medicine. Avoid alcoholic drinks. There are different types of narcotic medicines (opiates) for pain. If you take more than one type at the same time, you may have more side effects. Give your health care provider a list of all medicines you use. Your doctor will tell you how much medicine to take. Do not take more medicine than directed. Call emergency for help if you have problems breathing. This medicine will cause constipation. Try to have a bowel movement at least every 2 to 3 days. If you do not have a bowel movement for 3 days, call your doctor or health care professional. Your mouth may get dry. Drinking water, chewing sugarless gum, or sucking on hard candy may help. See your dentist every 6 months. What side effects may I notice from receiving this medicine? Side effects that you should report to your doctor or health care professional as soon as possible: -allergic reactions like skin rash, itching or hives, swelling of the face, lips, or tongue -breathing problems -change in the amount of urine -confusion -feeling faint or lightheaded -fever, chills -hallucinations -red or sore at the injection site -seizures -slow or fast heartbeat -unusually weak or tired Side effects that usually do not require medical attention (report to your doctor or health care professional if they continue or are bothersome): -constipation -dizziness -headache -nausea, vomiting -pinpoint pupils -sweating This list may not describe all possible side effects. Call your doctor for medical advice about side effects. You may report side effects to FDA at 1-800-FDA-1088. Where should I keep my medicine? Keep out of the  reach of children. This medicine can be abused. Keep it in a safe place to protect it from theft. Do not share this medicine with anyone. Selling or giving away this medicine is dangerous and is against the law. If you are using this medicine at home, you will be instructed on how to store this medicine. Throw away any unused medicine after the expiration date on the label. Discard unused medicine and used packaging carefully. Pets and children can be harmed if they find used or lost packages. NOTE: This sheet is a summary. It may not cover all possible information. If you have questions about this medicine, talk to your doctor, pharmacist, or health care provider.    2016, Elsevier/Gold Standard. (2013-05-24 21:34:59)

## 2015-12-06 NOTE — Progress Notes (Signed)
Hematology and Oncology Follow Up Visit  Mary [name] [id number] Mar 04, 1956 59 y.o. 12/06/2015   Principle Diagnosis:  Metastatic breast cancer- Triple positive  Current Therapy:   Halotestin 66m po BID    Interim History: Mary [name] here today with her husband for a follow-up. She is now being followed by hospice at home and continues to decline. She is alert and able to talk some today. She is c/o pain in her right side and right breast. She states she has not been getting her pain medication (oxycodone) regularly.  November CA 27.29 was 161.  Her husband states that she currently has a UTI and is on an antibiotic.  Her right breast wound looks slightly better and there is less of an odor.  She has had no seizure activity.  She denies fever, chills, n/v, rash, cough, chest pain, palpitations, abdominal pain or changes in her bowel or bladder habits.  She has some SOB with exertion.  She is not really getting out of bed much. She states that she is too weak to walk.  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  Medications:    Medication List       This list is accurate as of: 12/06/15 10:19 AM.  Always use your most recent med list.               ACCU-CHEK AVIVA PLUS test strip  Generic drug:  glucose blood  USE TO CHECK BLOOD GLUCOSE 4 TIMES DAILY AFTER MEALS AND AT BEDTIME     ACCU-CHEK SOFTCLIX LANCETS lancets  USE TO CHECK BLOOD GLUCOSE 4 TIMES DAILY     ciprofloxacin 500 MG tablet  Commonly known as:  CIPRO  Take 1 tablet (500 mg total) by mouth 2 (two) times daily.     clindamycin 300 MG capsule  Commonly known as:  CLEOCIN  Take 1 capsule (300 mg total) by mouth every 8 (eight) hours.     dexamethasone 2 MG tablet  Commonly known as:  DECADRON  Take 1 tablet (2 mg total) by mouth every 12 (twelve) hours.     feeding supplement (ENSURE ENLIVE) Liqd  Take 237 mLs by mouth 2 (two) times daily between meals.     fentaNYL 25 MCG/HR patch    Commonly known as:  DURAGESIC - dosed mcg/hr  Place 1 patch (25 mcg total) onto the skin every 3 (three) days.     fluoxymesterone 10 MG tablet  Commonly known as:  HALOTESTIN  Take 1 tablet (10 mg total) by mouth 2 (two) times daily.     gabapentin 300 MG capsule  Commonly known as:  NEURONTIN  Take 1 capsule (300 mg total) by mouth 3 (three) times daily.     glucose monitoring kit monitoring kit  1 each by Does not apply route 4 (four) times daily - after meals and at bedtime. 1 month Diabetic Testing Supplies for QAC-QHS accuchecks.     ACCU-CHEK AVIVA PLUS W/DEVICE Kit  USE TO CHECK BLOOD GLUCOSE 4 TIMES DAILY     insulin aspart 100 UNIT/ML FlexPen  Commonly known as:  NOVOLOG FLEXPEN  0-9 Units, Subcutaneous, 3 times daily with meals CBG < 70: call MD CBG 70 - 120: 0 units CBG 121 - 150: 1 unit CBG 151 - 200: 2 units CBG 201 - 250: 3 units CBG 251 - 300: 5 units CBG 301 - 350: 7 units CBG 351 - 400: 9 units CBG > 400: call MD  Okay to substitute with different band at equivalent dosing     insulin glargine 100 UNIT/ML injection  Commonly known as:  LANTUS  Inject 0.3 mLs (30 Units total) into the skin daily at 10 pm.     Insulin Pen Needle 32G X 8 MM Misc  Please provide 2 month supply. Okay to substitute with different brand     levETIRAcetam 500 MG tablet  Commonly known as:  KEPPRA  Take 1 tablet (500 mg total) by mouth 2 (two) times daily.     mometasone 0.1 % cream  Commonly known as:  ELOCON  Apply to lips 2x a day     nystatin 100000 UNIT/ML suspension  Commonly known as:  MYCOSTATIN  Take 5 mLs (500,000 Units total) by mouth 4 (four) times daily as needed (mouth pain).     ondansetron 8 MG tablet  Commonly known as:  ZOFRAN  Take 1 tablet (8 mg total) by mouth 2 (two) times daily. Start the day after chemo for 3 days. Then take as needed for nausea or vomiting.     oxyCODONE 5 MG immediate release tablet  Commonly known as:  Oxy IR/ROXICODONE  Take 1-3  tablets (5-15 mg total) by mouth every 6 (six) hours as needed for severe pain. Take 1-2 if needed for pain due to cancer.     pantoprazole 40 MG tablet  Commonly known as:  PROTONIX  Take 1 tablet (40 mg total) by mouth 2 (two) times daily.     potassium chloride SA 20 MEQ tablet  Commonly known as:  K-DUR,KLOR-CON  Take 1 tablet (20 mEq total) by mouth daily.     sodium hypochlorite external solution  Commonly known as:  DAKIN'S 1/2 STRENGTH  Use with each dressing change     tobramycin 0.3 % ophthalmic solution  Commonly known as:  TOBREX  Place 2 drops into the right eye every 6 (six) hours.        Allergies:  Allergies  Allergen Reactions  . Hydrocodone     "makes me crawl on the floor"  . Penicillins Itching    Per pt's sister, swelling of throat Has patient had a PCN reaction causing immediate rash, facial/tongue/throat swelling, SOB or lightheadedness with hypotension: No, just itching Has patient had a PCN reaction causing severe rash involving mucus membranes or skin necrosis: No Has patient had a PCN reaction that required hospitalization No Has patient had a PCN reaction occurring within the last 10 years: Yes If all of the above answers are "NO", then may proceed with Cephalosporin use.   . Aspirin Other (See Comments)    Due to ulcers    Past Medical History, Surgical history, Social history, and Family History were reviewed and updated.  Review of Systems: All other 10 point review of systems is negative.   Physical Exam:  height is 5' 5"  (1.651 m). Her oral temperature is 97.7 F (36.5 C). Her blood pressure is 134/85 and her pulse is 82. Her respiration is 98.   Wt Readings from Last 3 Encounters:  11/08/15 158 lb 4.6 oz (71.8 kg)  10/31/15 152 lb 14.4 oz (69.355 kg)  10/06/15 153 lb 3.5 oz (69.5 kg)    Ocular: Sclerae unicteric, pupils equal, round and reactive to light Ear-nose-throat: Oropharynx clear, dentition fair Lymphatic: No cervical or  supraclavicular adenopathy Lungs no rales or rhonchi, good excursion bilaterally Heart regular rate and rhythm, no murmur appreciated Abd soft, nontender, positive bowel sounds MSK no focal  spinal tenderness, no joint edema Neuro: non-focal, well-oriented, appropriate affect Breasts: Right breast wound better, no changes with left breast. No new lymphadenopathy.   Lab Results  Component Value Date   WBC 9.3 12/06/2015   HGB 10.0* 12/06/2015   HCT 32.4* 12/06/2015   MCV 103* 12/06/2015   PLT 294 12/06/2015   Lab Results  Component Value Date   FERRITIN 151 02/21/2015   IRON 47 02/21/2015   TIBC 398 02/21/2015   UIBC 351 02/21/2015   IRONPCTSAT 12* 02/21/2015   Lab Results  Component Value Date   RBC 3.14* 12/06/2015   No results found for: KPAFRELGTCHN, LAMBDASER, KAPLAMBRATIO No results found for: IGGSERUM, IGA, IGMSERUM No results found for: Odetta Pink, SPEI   Chemistry      Component Value Date/Time   NA 141 11/29/2015 0654   NA 136 11/15/2015 1307   NA 136 11/01/2015 1328   K 3.7 11/29/2015 0654   K 3.7 11/15/2015 1307   K 3.5 11/01/2015 1328   CL 105 11/29/2015 0654   CL 102 11/01/2015 1328   CO2 28 11/29/2015 0654   CO2 22 11/15/2015 1307   CO2 22 11/01/2015 1328   BUN 10 11/29/2015 0654   BUN 13.4 11/15/2015 1307   BUN 19 11/01/2015 1328   CREATININE 0.55 11/29/2015 0654   CREATININE 0.6 11/15/2015 1307   CREATININE 0.4* 11/01/2015 1328      Component Value Date/Time   CALCIUM 8.5* 11/29/2015 0654   CALCIUM 8.2* 11/15/2015 1307   CALCIUM 8.4 11/01/2015 1328   ALKPHOS 121 11/29/2015 0654   ALKPHOS 215* 11/15/2015 1307   ALKPHOS 272* 11/01/2015 1328   AST 36 11/29/2015 0654   AST 24 11/15/2015 1307   AST 82* 11/01/2015 1328   ALT 31 11/29/2015 0654   ALT 35 11/15/2015 1307   ALT 272* 11/01/2015 1328   BILITOT 1.2 11/29/2015 0654   BILITOT 1.27* 11/15/2015 1307   BILITOT 1.70* 11/01/2015 1328      Impression and Plan: Ms. Zulueta is 59 year old Afro-American female with metastatic breast cancer. She continues to decline and now has hospice coming to her home regularly to assist with her care.  She is c/o pain in her right breast and abdomen. She states that she has not been getting her pain medication regularly. I am not sure what is going on at home but I stressed the importance of pain management to her husband and if her sister has any questions she is free to call and I will speak with her.  We will have her start on Fentanyl Duragesic patch today 25 mcg q 72 hours.   We gave her Morphine 4 mg IV for pain while she is here and she states that she is feeling better.  We will plan to follow-up with her as needed during this time.   Both she and her husband know to contact us any questions or concerns. We can certainly see her when needed.   Eliezer Bottom, NP 12/9/201610:19 AM

## 2015-12-06 NOTE — Progress Notes (Signed)
11:03 AM Transported home via [facility] [employer].

## 2015-12-09 LAB — PREALBUMIN: Prealbumin: 29 mg/dL (ref 17–34)

## 2015-12-10 ENCOUNTER — Encounter: Payer: Self-pay | Admitting: Hematology & Oncology

## 2015-12-20 ENCOUNTER — Encounter (HOSPITAL_COMMUNITY): Payer: Self-pay | Admitting: Emergency Medicine

## 2015-12-20 ENCOUNTER — Emergency Department (HOSPITAL_COMMUNITY)

## 2015-12-20 ENCOUNTER — Telehealth: Payer: Self-pay | Admitting: *Deleted

## 2015-12-20 ENCOUNTER — Inpatient Hospital Stay (HOSPITAL_COMMUNITY)
Admission: EM | Admit: 2015-12-20 | Discharge: 2015-12-23 | DRG: 194 | Disposition: A | Discharge status: Hospice | Attending: Internal Medicine | Admitting: Internal Medicine

## 2015-12-20 DIAGNOSIS — C50911 Malignant neoplasm of unspecified site of right female breast: Secondary | ICD-10-CM | POA: Diagnosis not present

## 2015-12-20 DIAGNOSIS — C7951 Secondary malignant neoplasm of bone: Secondary | ICD-10-CM | POA: Diagnosis present

## 2015-12-20 DIAGNOSIS — Y95 Nosocomial condition: Secondary | ICD-10-CM | POA: Diagnosis present

## 2015-12-20 DIAGNOSIS — K5900 Constipation, unspecified: Secondary | ICD-10-CM | POA: Diagnosis present

## 2015-12-20 DIAGNOSIS — K5903 Drug induced constipation: Secondary | ICD-10-CM | POA: Diagnosis present

## 2015-12-20 DIAGNOSIS — E1165 Type 2 diabetes mellitus with hyperglycemia: Secondary | ICD-10-CM | POA: Diagnosis present

## 2015-12-20 DIAGNOSIS — Z66 Do not resuscitate: Secondary | ICD-10-CM | POA: Diagnosis present

## 2015-12-20 DIAGNOSIS — Z9221 Personal history of antineoplastic chemotherapy: Secondary | ICD-10-CM | POA: Diagnosis not present

## 2015-12-20 DIAGNOSIS — I10 Essential (primary) hypertension: Secondary | ICD-10-CM | POA: Diagnosis present

## 2015-12-20 DIAGNOSIS — Z8249 Family history of ischemic heart disease and other diseases of the circulatory system: Secondary | ICD-10-CM

## 2015-12-20 DIAGNOSIS — Z7401 Bed confinement status: Secondary | ICD-10-CM

## 2015-12-20 DIAGNOSIS — C50919 Malignant neoplasm of unspecified site of unspecified female breast: Secondary | ICD-10-CM | POA: Diagnosis not present

## 2015-12-20 DIAGNOSIS — Z7189 Other specified counseling: Secondary | ICD-10-CM | POA: Diagnosis not present

## 2015-12-20 DIAGNOSIS — T402X5A Adverse effect of other opioids, initial encounter: Secondary | ICD-10-CM | POA: Diagnosis present

## 2015-12-20 DIAGNOSIS — C7800 Secondary malignant neoplasm of unspecified lung: Secondary | ICD-10-CM | POA: Diagnosis present

## 2015-12-20 DIAGNOSIS — Z823 Family history of stroke: Secondary | ICD-10-CM | POA: Diagnosis not present

## 2015-12-20 DIAGNOSIS — Z79891 Long term (current) use of opiate analgesic: Secondary | ICD-10-CM

## 2015-12-20 DIAGNOSIS — G40909 Epilepsy, unspecified, not intractable, without status epilepticus: Secondary | ICD-10-CM | POA: Diagnosis present

## 2015-12-20 DIAGNOSIS — J189 Pneumonia, unspecified organism: Secondary | ICD-10-CM | POA: Diagnosis not present

## 2015-12-20 DIAGNOSIS — R109 Unspecified abdominal pain: Secondary | ICD-10-CM | POA: Diagnosis not present

## 2015-12-20 DIAGNOSIS — C7931 Secondary malignant neoplasm of brain: Secondary | ICD-10-CM | POA: Diagnosis present

## 2015-12-20 DIAGNOSIS — Z923 Personal history of irradiation: Secondary | ICD-10-CM | POA: Diagnosis not present

## 2015-12-20 DIAGNOSIS — D649 Anemia, unspecified: Secondary | ICD-10-CM | POA: Diagnosis not present

## 2015-12-20 DIAGNOSIS — M1990 Unspecified osteoarthritis, unspecified site: Secondary | ICD-10-CM | POA: Diagnosis present

## 2015-12-20 DIAGNOSIS — R0602 Shortness of breath: Secondary | ICD-10-CM | POA: Diagnosis not present

## 2015-12-20 DIAGNOSIS — N390 Urinary tract infection, site not specified: Secondary | ICD-10-CM | POA: Diagnosis present

## 2015-12-20 DIAGNOSIS — E876 Hypokalemia: Secondary | ICD-10-CM | POA: Diagnosis present

## 2015-12-20 DIAGNOSIS — Z794 Long term (current) use of insulin: Secondary | ICD-10-CM | POA: Diagnosis not present

## 2015-12-20 DIAGNOSIS — Z806 Family history of leukemia: Secondary | ICD-10-CM | POA: Diagnosis not present

## 2015-12-20 DIAGNOSIS — Z515 Encounter for palliative care: Secondary | ICD-10-CM | POA: Diagnosis present

## 2015-12-20 DIAGNOSIS — B370 Candidal stomatitis: Secondary | ICD-10-CM | POA: Diagnosis present

## 2015-12-20 DIAGNOSIS — G40109 Localization-related (focal) (partial) symptomatic epilepsy and epileptic syndromes with simple partial seizures, not intractable, without status epilepticus: Secondary | ICD-10-CM

## 2015-12-20 DIAGNOSIS — S21001A Unspecified open wound of right breast, initial encounter: Secondary | ICD-10-CM | POA: Diagnosis present

## 2015-12-20 LAB — CBC WITH DIFFERENTIAL/PLATELET
Basophils Absolute: 0 10*3/uL (ref 0.0–0.1)
Basophils Relative: 0 %
EOS PCT: 1 %
Eosinophils Absolute: 0.1 10*3/uL (ref 0.0–0.7)
HEMATOCRIT: 37.1 % (ref 36.0–46.0)
Hemoglobin: 11.6 g/dL — ABNORMAL LOW (ref 12.0–15.0)
Lymphocytes Relative: 13 %
Lymphs Abs: 1.3 10*3/uL (ref 0.7–4.0)
MCH: 33.1 pg (ref 26.0–34.0)
MCHC: 31.3 g/dL (ref 30.0–36.0)
MCV: 106 fL — AB (ref 78.0–100.0)
MONOS PCT: 10 %
Monocytes Absolute: 1 10*3/uL (ref 0.1–1.0)
NEUTROS PCT: 76 %
Neutro Abs: 7.8 10*3/uL — ABNORMAL HIGH (ref 1.7–7.7)
Platelets: 258 10*3/uL (ref 150–400)
RBC: 3.5 MIL/uL — AB (ref 3.87–5.11)
RDW: 22.2 % — ABNORMAL HIGH (ref 11.5–15.5)
WBC: 10.2 10*3/uL (ref 4.0–10.5)

## 2015-12-20 LAB — URINE MICROSCOPIC-ADD ON
BACTERIA UA: NONE SEEN
Squamous Epithelial / LPF: NONE SEEN

## 2015-12-20 LAB — COMPREHENSIVE METABOLIC PANEL
ALBUMIN: 2.3 g/dL — AB (ref 3.5–5.0)
ALT: 107 U/L — AB (ref 14–54)
AST: 73 U/L — AB (ref 15–41)
Alkaline Phosphatase: 151 U/L — ABNORMAL HIGH (ref 38–126)
Anion gap: 9 (ref 5–15)
BUN: 13 mg/dL (ref 6–20)
CHLORIDE: 109 mmol/L (ref 101–111)
CO2: 27 mmol/L (ref 22–32)
CREATININE: 0.56 mg/dL (ref 0.44–1.00)
Calcium: 8.3 mg/dL — ABNORMAL LOW (ref 8.9–10.3)
GFR calc Af Amer: >60 mL/min (ref >60)
GFR calc non Af Amer: >60 mL/min (ref >60)
GLUCOSE: 88 mg/dL (ref 65–99)
POTASSIUM: 3.4 mmol/L — AB (ref 3.5–5.1)
SODIUM: 145 mmol/L (ref 135–145)
Total Bilirubin: 0.8 mg/dL (ref 0.3–1.2)
Total Protein: 5.1 g/dL — ABNORMAL LOW (ref 6.5–8.1)

## 2015-12-20 LAB — BRAIN NATRIURETIC PEPTIDE: B Natriuretic Peptide: 29.5 pg/mL (ref 0.0–100.0)

## 2015-12-20 LAB — URINALYSIS, ROUTINE W REFLEX MICROSCOPIC
Bilirubin Urine: NEGATIVE
Glucose, UA: NEGATIVE mg/dL
KETONES UR: NEGATIVE mg/dL
NITRITE: NEGATIVE
PH: 6 (ref 5.0–8.0)
Protein, ur: NEGATIVE mg/dL
SPECIFIC GRAVITY, URINE: 1.011 (ref 1.005–1.030)

## 2015-12-20 LAB — PROTIME-INR
INR: 1.09 (ref 0.00–1.49)
Prothrombin Time: 14.3 seconds (ref 11.6–15.2)

## 2015-12-20 LAB — TROPONIN I

## 2015-12-20 MED ORDER — DEXAMETHASONE 2 MG PO TABS
2.0000 mg | ORAL_TABLET | Freq: Two times a day (BID) | ORAL | Status: DC
  Administered 2015-12-21 – 2015-12-23 (×6): 2 mg via ORAL
  Filled 2015-12-20 (×7): qty 1

## 2015-12-20 MED ORDER — LEVETIRACETAM 500 MG PO TABS
500.0000 mg | ORAL_TABLET | Freq: Two times a day (BID) | ORAL | Status: DC
  Administered 2015-12-21 – 2015-12-23 (×6): 500 mg via ORAL
  Filled 2015-12-20 (×7): qty 1

## 2015-12-20 MED ORDER — ENOXAPARIN SODIUM 40 MG/0.4ML ~~LOC~~ SOLN
40.0000 mg | Freq: Every day | SUBCUTANEOUS | Status: DC
  Administered 2015-12-21 (×2): 40 mg via SUBCUTANEOUS
  Filled 2015-12-20 (×3): qty 0.4

## 2015-12-20 MED ORDER — ALBUTEROL SULFATE (2.5 MG/3ML) 0.083% IN NEBU
5.0000 mg | INHALATION_SOLUTION | Freq: Once | RESPIRATORY_TRACT | Status: AC
  Administered 2015-12-20: 5 mg via RESPIRATORY_TRACT
  Filled 2015-12-20: qty 6

## 2015-12-20 MED ORDER — GABAPENTIN 300 MG PO CAPS
300.0000 mg | ORAL_CAPSULE | Freq: Every day | ORAL | Status: DC
  Administered 2015-12-21 – 2015-12-23 (×3): 300 mg via ORAL
  Filled 2015-12-20 (×4): qty 1

## 2015-12-20 MED ORDER — DEXTROSE 5 % IV SOLN
2.0000 g | Freq: Once | INTRAVENOUS | Status: AC
  Administered 2015-12-20: 2 g via INTRAVENOUS
  Filled 2015-12-20: qty 2

## 2015-12-20 MED ORDER — INSULIN ASPART 100 UNIT/ML ~~LOC~~ SOLN
0.0000 [IU] | Freq: Three times a day (TID) | SUBCUTANEOUS | Status: DC
  Administered 2015-12-21: 3 [IU] via SUBCUTANEOUS

## 2015-12-20 MED ORDER — TOBRAMYCIN 0.3 % OP SOLN
2.0000 [drp] | Freq: Four times a day (QID) | OPHTHALMIC | Status: DC
  Administered 2015-12-21 – 2015-12-23 (×10): 2 [drp] via OPHTHALMIC
  Filled 2015-12-20: qty 5

## 2015-12-20 MED ORDER — VANCOMYCIN HCL IN DEXTROSE 1-5 GM/200ML-% IV SOLN
1000.0000 mg | Freq: Once | INTRAVENOUS | Status: AC
  Administered 2015-12-20: 1000 mg via INTRAVENOUS
  Filled 2015-12-20: qty 200

## 2015-12-20 MED ORDER — OXYCODONE HCL 5 MG PO TABS
5.0000 mg | ORAL_TABLET | Freq: Four times a day (QID) | ORAL | Status: DC | PRN
  Administered 2015-12-21: 5 mg via ORAL
  Administered 2015-12-21 – 2015-12-22 (×2): 10 mg via ORAL
  Administered 2015-12-22: 15 mg via ORAL
  Administered 2015-12-23: 10 mg via ORAL
  Administered 2015-12-23: 15 mg via ORAL
  Filled 2015-12-20 (×3): qty 3
  Filled 2015-12-20: qty 2
  Filled 2015-12-20: qty 3
  Filled 2015-12-20: qty 2
  Filled 2015-12-20: qty 1

## 2015-12-20 MED ORDER — DEXTROSE 5 % IV SOLN
2.0000 g | Freq: Three times a day (TID) | INTRAVENOUS | Status: DC
  Filled 2015-12-20: qty 2

## 2015-12-20 MED ORDER — FLUOXYMESTERONE 10 MG PO TABS: 10.0000 mg | ORAL_TABLET | Freq: Every day | ORAL | Status: DC

## 2015-12-20 MED ORDER — POTASSIUM CHLORIDE CRYS ER 20 MEQ PO TBCR: 20.0000 meq | EXTENDED_RELEASE_TABLET | Freq: Every day | ORAL | Status: DC

## 2015-12-20 MED ORDER — SODIUM CHLORIDE 0.9 % IV BOLUS (SEPSIS)
1000.0000 mL | Freq: Once | INTRAVENOUS | Status: AC
  Administered 2015-12-20: 1000 mL via INTRAVENOUS

## 2015-12-20 MED ORDER — SENNA 8.6 MG PO TABS
1.0000 | ORAL_TABLET | Freq: Every day | ORAL | Status: DC
  Administered 2015-12-21 – 2015-12-23 (×3): 8.6 mg via ORAL
  Filled 2015-12-20 (×4): qty 1

## 2015-12-20 MED ORDER — DEXTROSE 5 % IV SOLN
2.0000 g | Freq: Three times a day (TID) | INTRAVENOUS | Status: DC
  Administered 2015-12-21 – 2015-12-23 (×7): 2 g via INTRAVENOUS
  Filled 2015-12-20 (×10): qty 2

## 2015-12-20 MED ORDER — POTASSIUM CHLORIDE IN NACL 20-0.9 MEQ/L-% IV SOLN
INTRAVENOUS | Status: DC
  Administered 2015-12-21: 01:00:00 via INTRAVENOUS
  Filled 2015-12-20 (×2): qty 1000

## 2015-12-20 MED ORDER — NYSTATIN 100000 UNIT/ML MT SUSP: 5.0000 mL | Freq: Four times a day (QID) | OROMUCOSAL | Status: DC | PRN

## 2015-12-20 MED ORDER — LORAZEPAM 2 MG/ML IJ SOLN
1.0000 mg | Freq: Once | INTRAMUSCULAR | Status: AC
  Administered 2015-12-20: 1 mg via INTRAVENOUS
  Filled 2015-12-20: qty 1

## 2015-12-20 NOTE — H&P (Signed)
[facility] History and Physical  CACEY WILLOW HCW:[id number] DOB: 10/02/1956 DOA: 12/20/2015  Referring physician: ED physician PCP: Sandi Mariscal, MD  Specialists: [name] (rad onc), [name] (med onc)  Chief Complaint: Weakness, SOB, cough  HPI: Mary [name] is a 59 y.o. 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 These symptoms have worsened acutely over the past several days with no relieving or aggravating factors identified. She denies pleuritic pain, calf tenderness or swelling, palpitations, or hemoptysis. Ms. Lehrman also denies fevers or chills, sick contacts, or recent travel. She is currently on home hospice while she continues chemotherapy and radiation.   In ED, patient was found to be afebrile, saturating in the 90s on room air, and normotensive, and initially without tachycardia or tachypnea. She was given nebulized breathing treatment in the ED before becoming tachycardic. There was no leukocytosis on CBC, BNP was 30, and troponin undetectable. Two-view chest x-ray demonstrated bilateral airspace opacities centrally, consistent with pulmonary edema vs multifocal infiltrate.  Blood cultures were obtained and she was started on empiric treatment for suspected HCAP. She remained hemodynamically stable in the emergency department and the hospitalists were asked to admit.  Where does patient live?   At home    Can patient participate in ADLs?   Some   Review of Systems:   General: no fevers, chills, positive for weight loss, poor appetite, and fatigue  HEENT: no blurry vision or hearing changes.  Sore throat Pulm: dyspnea, cough. No wheeze CV: no chest pain or palpitations Abd: no nausea, vomiting, abdominal pain, or diarrhea. Mild constipation GU: no dysuria, hematuria, increased urinary frequency, or urgency  Ext:   subjective bilateral leg edema Neuro: no focal weakness, numbness, or tingling, no vision change or hearing loss Skin: Breast wound, no new rash  MSK: No muscle spasm, no deformity, no red, hot, or swollen joint Heme: No easy bruising or bleeding Travel history: No recent long distant travel    Allergy:  Allergies  Allergen Reactions  . Hydrocodone     "makes me crawl on the floor"  . Penicillins Itching    Per pt's sister, swelling of throat Has patient had a PCN reaction causing immediate rash, facial/tongue/throat swelling, SOB or lightheadedness with hypotension: No, just itching Has patient had a PCN reaction causing severe rash involving mucus membranes or skin necrosis: No Has patient had a PCN reaction that required hospitalization No Has patient had a PCN reaction occurring within the last 10 years: Yes If all of the above answers are "NO", then may proceed with Cephalosporin use.   . Aspirin Other (See Comments)    Due to ulcers    Past Medical History  Diagnosis Date  . Arthritis   . Depression   . Fainting   . Headache   . Hypertension   . Migraines   . History of blood transfusion   . Gastric ulcer   . Breast cancer metastasized to multiple sites [facility]) 04/12/2014    10/30/15 radiation, chemotherapy  . Seizures [facility])     Past Surgical History  Procedure Laterality Date  . Abdominal hysterectomy  15 years go    partial    Social History:  reports that she has never smoked. She has never used smokeless tobacco. She reports that she does not drink alcohol or use illicit drugs.  Family History:  Family History  Problem Relation Age of Onset  . Arthritis Other   . Hypertension Mother   . Hypertension Other   . Heart disease Other   . Stroke Father   . Cancer Sister     leukemia     Prior to Admission medications   Medication Sig Start Date End Date Taking? Authorizing Provider  ACCU-CHEK AVIVA PLUS test strip USE TO CHECK BLOOD GLUCOSE 4 TIMES DAILY  AFTER MEALS AND AT BEDTIME 10/10/15  Yes Historical Provider, MD  ACCU-CHEK SOFTCLIX LANCETS lancets USE TO CHECK BLOOD GLUCOSE 4 TIMES DAILY 10/10/15  Yes Historical Provider, MD  Blood Glucose Monitoring Suppl (ACCU-CHEK AVIVA PLUS) W/DEVICE KIT USE TO CHECK BLOOD GLUCOSE 4 TIMES DAILY 10/10/15  Yes Historical Provider, MD  [facility] SENNA 8.6 MG tablet Take 1 tablet by mouth daily.  12/08/15  Yes Historical Provider, MD  dexamethasone (DECADRON) 2 MG tablet Take 1 tablet (2 mg total) by mouth every 12 (twelve) hours. 11/10/15  Yes Nita Sells, MD  fentaNYL (DURAGESIC - DOSED MCG/HR) 25 MCG/HR patch Place 1 patch (25 mcg total) onto the skin every 3 (three) days. 12/06/15  Yes Eliezer Bottom, NP  fluoxymesterone (HALOTESTIN) 10 MG tablet Take 1 tablet (10 mg total) by mouth 2 (two) times daily. Patient taking differently: Take 10 mg by mouth daily.  11/15/15  Yes Volanda Napoleon, MD  gabapentin (NEURONTIN) 300 MG capsule Take 1 capsule (300 mg total) by mouth 3 (three) times daily. Patient taking differently: Take 300 mg by mouth daily.  08/28/15  Yes Volanda Napoleon, MD  glucose monitoring kit (FREESTYLE) monitoring kit 1 each by Does not apply route 4 (four) times daily - after meals and at bedtime. 1 month Diabetic Testing Supplies for QAC-QHS accuchecks. 10/09/15  Yes Shanker Kristeen Mans, MD  insulin aspart (NOVOLOG FLEXPEN) 100 UNIT/ML FlexPen 0-9 Units, Subcutaneous, 3 times daily with meals CBG < 70: call MD CBG 70 - 120: 0 units CBG 121 - 150: 1 unit CBG 151 - 200: 2 units CBG 201 - 250: 3 units CBG 251 - 300: 5 units CBG 301 - 350: 7 units CBG 351 - 400: 9 units CBG > 400: call MD  Okay to substitute with different band at equivalent dosing 10/09/15  Yes Shanker Kristeen Mans, MD  Insulin Pen Needle 32G X 8 MM MISC Please provide 2 month supply. Okay to substitute with different brand 10/09/15  Yes Shanker Kristeen Mans, MD  levETIRAcetam (KEPPRA) 500 MG tablet Take 1 tablet (500 mg  total) by mouth 2 (two) times daily. 11/10/15  Yes Nita Sells, MD  mometasone (ELOCON) 0.1 % cream Apply to lips 2x a day Patient taking differently: Apply 1 application topically daily as needed (irritation). Apply to lips 2x a day 11/15/15  Yes Volanda Napoleon, MD  nystatin (MYCOSTATIN) 100000 UNIT/ML suspension Take 5 mLs (500,000 Units total) by mouth 4 (four) times daily as needed (mouth pain). 11/10/15  Yes Nita Sells, MD  ondansetron (ZOFRAN) 8 MG tablet Take 1 tablet (8 mg total) by mouth 2 (two) times daily. Start the day after chemo for 3 days. Then take as needed for nausea or vomiting. 10/09/15  Yes Shanker Kristeen Mans, MD  oxyCODONE (OXY IR/ROXICODONE) 5 MG immediate release tablet Take 1-3 tablets (5-15 mg total) by mouth every 6 (six) hours as needed for severe pain. Take 1-2 if needed for pain due to cancer. 11/10/15  Yes Nita Sells, MD  potassium chloride SA (K-DUR,KLOR-CON) 20 MEQ tablet  Take 1 tablet (20 mEq total) by mouth daily. 11/10/15  Yes Nita Sells, MD  PROAIR HFA 108 (90 BASE) MCG/ACT inhaler Inhale 2 puffs into the lungs every 6 (six) hours as needed. Shortness of breath/wheezing 12/11/15  Yes Historical Provider, MD  sodium hypochlorite (DAKIN'S 1/2 STRENGTH) external solution Use with each dressing change 11/11/15  Yes Volanda Napoleon, MD  tobramycin (TOBREX) 0.3 % ophthalmic solution Place 2 drops into the right eye every 6 (six) hours. 11/10/15  Yes Nita Sells, MD  ciprofloxacin (CIPRO) 500 MG tablet Take 1 tablet (500 mg total) by mouth 2 (two) times daily. Patient not taking: Reported on 12/20/2015 11/29/15   Tatyana Kirichenko, PA-C  clindamycin (CLEOCIN) 300 MG capsule Take 1 capsule (300 mg total) by mouth every 8 (eight) hours. Patient not taking: Reported on 12/20/2015 11/10/15   Nita Sells, MD  feeding supplement, ENSURE ENLIVE, (ENSURE ENLIVE) LIQD Take 237 mLs by mouth 2 (two) times daily between  meals. Patient not taking: Reported on 12/20/2015 11/10/15   Nita Sells, MD  insulin glargine (LANTUS) 100 UNIT/ML injection Inject 0.3 mLs (30 Units total) into the skin daily at 10 pm. Patient not taking: Reported on 12/20/2015 11/10/15   Nita Sells, MD  pantoprazole (PROTONIX) 40 MG tablet Take 1 tablet (40 mg total) by mouth 2 (two) times daily. Patient not taking: Reported on 12/20/2015 10/09/15   Jonetta Osgood, MD    Physical Exam: Filed Vitals:   12/20/15 2130 12/20/15 2236 12/20/15 2247 12/20/15 2307  BP: 134/103  147/100 138/102  Pulse: 107  118 119  Temp:      TempSrc:      Resp:    18  Height:  5' 5"  (1.651 m)    Weight:  77.111 kg (170 lb)    SpO2: 95%  96% 98%   General:  somnolent, Not in acute distress HEENT:       Eyes: PERRL, EOMI, no scleral icterus or conjunctival pallor.       ENT: No discharge from the ears or nose,  white plaque on soft palate.        Neck: No JVD, no bruit, no appreciable mass Heme: No cervical adenopathy, no pallor Cardiac: S1/S2, RRR, grade 2 midsystolic murmur at LSB, No gallops or rubs. Pulm: Good air movement bilaterally. coarse rhonchi bilaterally No wheezing or rubs. Abd: Soft, nondistended, nontender, no rebound pain or gaurding, BS present. Ext: 2+ pitting edema of LEs bilaterally. 2+DP/PT pulse bilaterally. Musculoskeletal: No gross deformity, no red, hot, swollen joints   Skin:  warm and dry   Neuro: Somnolent, arousable, oriented X3, PERRL, EOMI Psych: Patient is not overtly psychotic.  Labs on Admission:  Basic Metabolic Panel:  Recent Labs Lab 12/20/15 2120  NA 145  K 3.4*  CL 109  CO2 27  GLUCOSE 88  BUN 13  CREATININE 0.56  CALCIUM 8.3*   Liver Function Tests:  Recent Labs Lab 12/20/15 2120  AST 73*  ALT 107*  ALKPHOS 151*  BILITOT 0.8  PROT 5.1*  ALBUMIN 2.3*   No results for input(s): LIPASE, AMYLASE in the last 168 hours. No results for input(s): AMMONIA in the last 168  hours. CBC:  Recent Labs Lab 12/20/15 2120  WBC 10.2  NEUTROABS 7.8*  HGB 11.6*  HCT 37.1  MCV 106.0*  PLT 258   Cardiac Enzymes:  Recent Labs Lab 12/20/15 2120  TROPONINI <0.03    BNP (last 3 results)  Recent Labs  12/20/15 2120  BNP 29.5    ProBNP (last 3 results) No results for input(s): PROBNP in the last 8760 hours.  CBG: No results for input(s): GLUCAP in the last 168 hours.  Radiological Exams on Admission: Dg Chest 2 View  12/20/2015  CLINICAL DATA:  Acute onset of generalized weakness and shortness of breath. Cough. Initial encounter. EXAM: CHEST  2 VIEW COMPARISON:  Chest radiograph performed 11/09/2015, and CT of the chest, abdomen and pelvis from 09/04/2015 FINDINGS: The lungs are well-aerated. Small bilateral pleural effusions are noted, with bilateral central airspace opacities. This may reflect multifocal pneumonia or pulmonary edema. Known underlying pulmonary metastases are difficult to fully assess on radiograph. No pneumothorax is seen. The heart is normal in size; the patient's left-sided chest port is noted ending about the mid SVC. Known osseous metastatic disease is not well characterized on radiograph. IMPRESSION: 1. Small bilateral pleural effusions, with bilateral central airspace opacities. This may reflect multifocal pneumonia or pulmonary edema. 2. Known underlying pulmonary metastases and bony metastases are not well characterized on radiograph. Electronically Signed   By: Garald Balding M.D.   On: 12/20/2015 21:37    EKG: Independently reviewed.  Abnormal findings:   Sinus, non-specific ST abnormality, not significantly changed from prior   Assessment/Plan  1.  Suspected HCAP  - DDx favors multifocal pneumonia. Pulmonary edema less likely given low BNP, no JVD, no gallop  - PE considered, Well's score is 4 as patient is bedbound with active malignancy and tachycardia. Will hold off on further PE workup for now as patient is stable, PNA is  more likely, and full anticoagulation would be precluded by numerous brain metastases with evidence of hemorrhage.  - Given beta-lactam allergy, treating with vancomycin and aztreonam with pharmacy consultation - Follow-up blood and sputum cultures, urine antigens  - Monitoring on continuous telemetry, pulse oximetry  - IS q2h wa - Supplemental O2 as needed - Trial of albuterol nebs - Anticipate recurrence in the setting of known pulmonary metastases  2. Oropharyngeal candidiasis  - Continue nystatin   3. Widely metastatic breast cancer  - Continue fluoxymesterone and Decadron - Continue Keppra for seizure prophylaxis  - Pain control with oxycodone  - Palliative consult for symptom management  - Social work consulted for  clarification of advanced directives   4. Insulin-dependent diabetes - Patient on sliding scale correctional only at home  - CBGs with meals and at bedtime  - Place on moderate sliding scale insulin, to be adjusted as needed - A1c pending - Carbohydrate consistent diet   DVT ppx:  SQ Lovenox   Code Status: Partial, do not sustain on ventilator or pressors if no rapid recovery Family Communication:  Yes, patient's sister at bed side Disposition Plan: Admit to inpatient   Date of Service 12/20/2015    Vianne Bulls, MD [facility] Pager [phone]  If 7PM-7AM, please contact night-coverage [email or web address] Password [facility] 12/20/2015, 11:20 PM

## 2015-12-20 NOTE — Telephone Encounter (Signed)
Mary [name], Hospice RN, updating office on patient. Mary [name] has had increased wheezing for which hospice has prescribed a albuterol nebulizer and increased her dex to 4mg  TID. Patient also has 2+ pitting edema from her toes to hips. Patient's sister has started her on OTC diurex. Dr Marin Olp given update and no orders received for change.

## 2015-12-20 NOTE — ED Provider Notes (Signed)
CSN: [id number]     Arrival date & time 12/20/15  2045 History   First MD Initiated Contact with Patient 12/20/15 2051     Chief Complaint  Patient presents with  . Weakness     (Consider location/radiation/quality/duration/timing/severity/associated sxs/prior Treatment) HPI  59 year old female with a history of breast cancer with metastasis who has recently had radiation and chemotherapy presents with progressive dyspnea. This is been ongoing for the past 2 weeks. She has had a cough with it. Notes for 1 week has had leg swelling up to her knees but seems to be somewhat improved currently. Patient also has generalized weakness and dizziness with standing for the past one month. The shortness of breath is at the point that she was worried she would stop breathing for she came to the ER. Family endorses she is not eating and drinking well and has had significantly decreased oral intake. Patient tried albuterol at home with no significant relief. Denies chest pain.  Past Medical History  Diagnosis Date  . Arthritis   . Depression   . Fainting   . Headache   . Hypertension   . Migraines   . History of blood transfusion   . Gastric ulcer   . Breast cancer metastasized to multiple sites [facility]) 04/12/2014    10/30/15 radiation, chemotherapy  . Seizures [facility])    Past Surgical History  Procedure Laterality Date  . Abdominal hysterectomy  15 years go    partial   Family History  Problem Relation Age of Onset  . Arthritis Other   . Hypertension Mother   . Hypertension Other   . Heart disease Other   . Stroke Father   . Cancer Sister     leukemia   Social History  Substance Use Topics  . Smoking status: Never Smoker   . Smokeless tobacco: Never Used     Comment: never used tobacco  . Alcohol Use: No   OB History    No data available     Review of Systems  Constitutional: Positive for fatigue. Negative for fever.  Respiratory: Positive for cough and shortness of breath.    Cardiovascular: Positive for leg swelling. Negative for chest pain.  Gastrointestinal: Negative for abdominal pain.  Neurological: Positive for dizziness and weakness.  All other systems reviewed and are negative.     Allergies  Hydrocodone; Penicillins; and Aspirin  Home Medications   Prior to Admission medications   Medication Sig Start Date End Date Taking? Authorizing Provider  ACCU-CHEK AVIVA PLUS test strip USE TO CHECK BLOOD GLUCOSE 4 TIMES DAILY AFTER MEALS AND AT BEDTIME 10/10/15  Yes Historical Provider, MD  ACCU-CHEK SOFTCLIX LANCETS lancets USE TO CHECK BLOOD GLUCOSE 4 TIMES DAILY 10/10/15  Yes Historical Provider, MD  Blood Glucose Monitoring Suppl (ACCU-CHEK AVIVA PLUS) W/DEVICE KIT USE TO CHECK BLOOD GLUCOSE 4 TIMES DAILY 10/10/15  Yes Historical Provider, MD  [employer] SENNA 8.6 MG tablet Take 1 tablet by mouth daily.  12/08/15  Yes Historical Provider, MD  dexamethasone (DECADRON) 2 MG tablet Take 1 tablet (2 mg total) by mouth every 12 (twelve) hours. 11/10/15  Yes Nita Sells, MD  fentaNYL (DURAGESIC - DOSED MCG/HR) 25 MCG/HR patch Place 1 patch (25 mcg total) onto the skin every 3 (three) days. 12/06/15  Yes Eliezer Bottom, NP  fluoxymesterone (HALOTESTIN) 10 MG tablet Take 1 tablet (10 mg total) by mouth 2 (two) times daily. Patient taking differently: Take 10 mg by mouth daily.  11/15/15  Yes Collier Salina  Oletha Cruel, MD  gabapentin (NEURONTIN) 300 MG capsule Take 1 capsule (300 mg total) by mouth 3 (three) times daily. Patient taking differently: Take 300 mg by mouth daily.  08/28/15  Yes Volanda Napoleon, MD  glucose monitoring kit (FREESTYLE) monitoring kit 1 each by Does not apply route 4 (four) times daily - after meals and at bedtime. 1 month Diabetic Testing Supplies for QAC-QHS accuchecks. 10/09/15  Yes Shanker Kristeen Mans, MD  insulin aspart (NOVOLOG FLEXPEN) 100 UNIT/ML FlexPen 0-9 Units, Subcutaneous, 3 times daily with meals CBG < 70: call MD CBG 70 - 120: 0  units CBG 121 - 150: 1 unit CBG 151 - 200: 2 units CBG 201 - 250: 3 units CBG 251 - 300: 5 units CBG 301 - 350: 7 units CBG 351 - 400: 9 units CBG > 400: call MD  Okay to substitute with different band at equivalent dosing 10/09/15  Yes Shanker Kristeen Mans, MD  Insulin Pen Needle 32G X 8 MM MISC Please provide 2 month supply. Okay to substitute with different brand 10/09/15  Yes Shanker Kristeen Mans, MD  levETIRAcetam (KEPPRA) 500 MG tablet Take 1 tablet (500 mg total) by mouth 2 (two) times daily. 11/10/15  Yes Nita Sells, MD  mometasone (ELOCON) 0.1 % cream Apply to lips 2x a day Patient taking differently: Apply 1 application topically daily as needed (irritation). Apply to lips 2x a day 11/15/15  Yes Volanda Napoleon, MD  nystatin (MYCOSTATIN) 100000 UNIT/ML suspension Take 5 mLs (500,000 Units total) by mouth 4 (four) times daily as needed (mouth pain). 11/10/15  Yes Nita Sells, MD  ondansetron (ZOFRAN) 8 MG tablet Take 1 tablet (8 mg total) by mouth 2 (two) times daily. Start the day after chemo for 3 days. Then take as needed for nausea or vomiting. 10/09/15  Yes Shanker Kristeen Mans, MD  oxyCODONE (OXY IR/ROXICODONE) 5 MG immediate release tablet Take 1-3 tablets (5-15 mg total) by mouth every 6 (six) hours as needed for severe pain. Take 1-2 if needed for pain due to cancer. 11/10/15  Yes Nita Sells, MD  potassium chloride SA (K-DUR,KLOR-CON) 20 MEQ tablet Take 1 tablet (20 mEq total) by mouth daily. 11/10/15  Yes Nita Sells, MD  PROAIR HFA 108 (90 BASE) MCG/ACT inhaler Inhale 2 puffs into the lungs every 6 (six) hours as needed. Shortness of breath/wheezing 12/11/15  Yes Historical Provider, MD  sodium hypochlorite (DAKIN'S 1/2 STRENGTH) external solution Use with each dressing change 11/11/15  Yes Volanda Napoleon, MD  tobramycin (TOBREX) 0.3 % ophthalmic solution Place 2 drops into the right eye every 6 (six) hours. 11/10/15  Yes Nita Sells, MD   ciprofloxacin (CIPRO) 500 MG tablet Take 1 tablet (500 mg total) by mouth 2 (two) times daily. Patient not taking: Reported on 12/20/2015 11/29/15   Tatyana Kirichenko, PA-C  clindamycin (CLEOCIN) 300 MG capsule Take 1 capsule (300 mg total) by mouth every 8 (eight) hours. Patient not taking: Reported on 12/20/2015 11/10/15   Nita Sells, MD  feeding supplement, ENSURE ENLIVE, (ENSURE ENLIVE) LIQD Take 237 mLs by mouth 2 (two) times daily between meals. Patient not taking: Reported on 12/20/2015 11/10/15   Nita Sells, MD  insulin glargine (LANTUS) 100 UNIT/ML injection Inject 0.3 mLs (30 Units total) into the skin daily at 10 pm. Patient not taking: Reported on 12/20/2015 11/10/15   Nita Sells, MD  pantoprazole (PROTONIX) 40 MG tablet Take 1 tablet (40 mg total) by mouth 2 (two) times daily. Patient not  taking: Reported on 12/20/2015 10/09/15   Jonetta Osgood, MD   BP 134/103 mmHg  Pulse 107  Temp(Src) 98.9 F (37.2 C) (Oral)  Resp 15  SpO2 95% Physical Exam  Constitutional: She is oriented to person, place, and time. She appears well-developed and well-nourished.  HENT:  Head: Normocephalic and atraumatic.  Right Ear: External ear normal.  Left Ear: External ear normal.  Nose: Nose normal.  Eyes: Right eye exhibits no discharge. Left eye exhibits no discharge.  Cardiovascular: Normal rate, regular rhythm and normal heart sounds.   Pulmonary/Chest: Effort normal. No accessory muscle usage. No tachypnea. No respiratory distress. She has decreased breath sounds in the right lower field and the left lower field.  Scant expiratory wheezes  Abdominal: Soft. She exhibits no distension. There is no tenderness.  Musculoskeletal: She exhibits no edema.  Non pitting swelling to bilateral knees. Full ROM  Neurological: She is alert and oriented to person, place, and time.  Skin: Skin is warm and dry. She is not diaphoretic.  Nursing note and vitals  reviewed.   ED Course  Procedures (including critical care time) Labs Review Labs Reviewed  COMPREHENSIVE METABOLIC PANEL - Abnormal; Notable for the following:    Potassium 3.4 (*)    Calcium 8.3 (*)    Total Protein 5.1 (*)    Albumin 2.3 (*)    AST 73 (*)    ALT 107 (*)    Alkaline Phosphatase 151 (*)    All other components within normal limits  CBC WITH DIFFERENTIAL/PLATELET - Abnormal; Notable for the following:    RBC 3.50 (*)    Hemoglobin 11.6 (*)    MCV 106.0 (*)    RDW 22.2 (*)    Neutro Abs 7.8 (*)    All other components within normal limits  CULTURE, BLOOD (ROUTINE X 2)  CULTURE, BLOOD (ROUTINE X 2)  TROPONIN I  PROTIME-INR  BRAIN NATRIURETIC PEPTIDE  URINALYSIS, ROUTINE W REFLEX MICROSCOPIC (NOT AT [facility])    Imaging Review Dg Chest 2 View  12/20/2015  CLINICAL DATA:  Acute onset of generalized weakness and shortness of breath. Cough. Initial encounter. EXAM: CHEST  2 VIEW COMPARISON:  Chest radiograph performed 11/09/2015, and CT of the chest, abdomen and pelvis from 09/04/2015 FINDINGS: The lungs are well-aerated. Small bilateral pleural effusions are noted, with bilateral central airspace opacities. This may reflect multifocal pneumonia or pulmonary edema. Known underlying pulmonary metastases are difficult to fully assess on radiograph. No pneumothorax is seen. The heart is normal in size; the patient's left-sided chest port is noted ending about the mid SVC. Known osseous metastatic disease is not well characterized on radiograph. IMPRESSION: 1. Small bilateral pleural effusions, with bilateral central airspace opacities. This may reflect multifocal pneumonia or pulmonary edema. 2. Known underlying pulmonary metastases and bony metastases are not well characterized on radiograph. Electronically Signed   By: Garald Balding M.D.   On: 12/20/2015 21:37   I have personally reviewed and evaluated these images and lab results as part of my medical decision-making.    EKG Interpretation   Date/Time:  Friday December 20 2015 20:53:48 EST Ventricular Rate:  96 PR Interval:  169 QRS Duration: 96 QT Interval:  350 QTC Calculation: 442 R Axis:   72 Text Interpretation:  Sinus rhythm Borderline low voltage, extremity leads  Nonspecific T abnormalities, lateral leads no significant change since Nov  2016 Confirmed by Regenia Skeeter  MD, Carleen Rhue [phone]) on 12/20/2015 9:08:25 PM      MDM  Final diagnoses:  HCAP (healthcare-associated pneumonia)    Patient is in no distress here, workup is consistent with hospital-acquired pneumonia. No signs on exam of fluid overload and thus I think pulmonary edema is less likely. She is afebrile but refused a rectal temp. Borderline white blood cell count but patient is immunocompromised with her recent chemotherapy and metastatic disease. Patient will be admitted to the hospitalist on telemetry with IV antibiotics.    Sherwood Gambler, MD 12/20/15 2300

## 2015-12-20 NOTE — ED Notes (Signed)
Pt refused rectal temp.

## 2015-12-20 NOTE — ED Notes (Signed)
Pt refused to have rectal temperature done.

## 2015-12-20 NOTE — ED Notes (Signed)
Brought in by EMS from home with c/o generalized weakness.  Pt reports that she has been feeling weak for the past few days.  Also reports shortness of breath earlier--- took albuterol tx but "no relief"; stated that she has also been coughing for 3 days now.  Hx of breast cancer.

## 2015-12-20 NOTE — ED Notes (Signed)
Bed: [id number] Expected date:  Expected time:  Means of arrival:  Comments: Short of breath

## 2015-12-21 DIAGNOSIS — C50919 Malignant neoplasm of unspecified site of unspecified female breast: Secondary | ICD-10-CM

## 2015-12-21 DIAGNOSIS — N390 Urinary tract infection, site not specified: Secondary | ICD-10-CM | POA: Diagnosis present

## 2015-12-21 DIAGNOSIS — J189 Pneumonia, unspecified organism: Principal | ICD-10-CM

## 2015-12-21 DIAGNOSIS — Z7189 Other specified counseling: Secondary | ICD-10-CM

## 2015-12-21 DIAGNOSIS — R109 Unspecified abdominal pain: Secondary | ICD-10-CM | POA: Diagnosis present

## 2015-12-21 DIAGNOSIS — G40109 Localization-related (focal) (partial) symptomatic epilepsy and epileptic syndromes with simple partial seizures, not intractable, without status epilepticus: Secondary | ICD-10-CM

## 2015-12-21 DIAGNOSIS — K5900 Constipation, unspecified: Secondary | ICD-10-CM | POA: Diagnosis present

## 2015-12-21 DIAGNOSIS — S21001A Unspecified open wound of right breast, initial encounter: Secondary | ICD-10-CM

## 2015-12-21 DIAGNOSIS — D649 Anemia, unspecified: Secondary | ICD-10-CM

## 2015-12-21 LAB — GLUCOSE, CAPILLARY
GLUCOSE-CAPILLARY: 84 mg/dL (ref 65–99)
GLUCOSE-CAPILLARY: 107 mg/dL — AB (ref 65–99)
GLUCOSE-CAPILLARY: 167 mg/dL — AB (ref 65–99)
GLUCOSE-CAPILLARY: 122 mg/dL — AB (ref 65–99)

## 2015-12-21 LAB — COMPREHENSIVE METABOLIC PANEL
ALT: 81 U/L — ABNORMAL HIGH (ref 14–54)
ANION GAP: 7 (ref 5–15)
AST: 57 U/L — ABNORMAL HIGH (ref 15–41)
Albumin: 1.8 g/dL — ABNORMAL LOW (ref 3.5–5.0)
Alkaline Phosphatase: 112 U/L (ref 38–126)
BUN: 9 mg/dL (ref 6–20)
CHLORIDE: 114 mmol/L — AB (ref 101–111)
CO2: 21 mmol/L — AB (ref 22–32)
Calcium: 6.9 mg/dL — ABNORMAL LOW (ref 8.9–10.3)
Creatinine, Ser: 0.32 mg/dL — ABNORMAL LOW (ref 0.44–1.00)
GFR calc non Af Amer: >60 mL/min (ref >60)
Glucose, Bld: 64 mg/dL — ABNORMAL LOW (ref 65–99)
Potassium: 2.7 mmol/L — CL (ref 3.5–5.1)
SODIUM: 142 mmol/L (ref 135–145)
Total Bilirubin: 1 mg/dL (ref 0.3–1.2)
Total Protein: 4.1 g/dL — ABNORMAL LOW (ref 6.5–8.1)

## 2015-12-21 LAB — CBC WITH DIFFERENTIAL/PLATELET
BASOS PCT: 0 %
Basophils Absolute: 0 10*3/uL (ref 0.0–0.1)
EOS ABS: 0.1 10*3/uL (ref 0.0–0.7)
EOS PCT: 1 %
HCT: 24.9 % — ABNORMAL LOW (ref 36.0–46.0)
HEMOGLOBIN: 7.9 g/dL — AB (ref 12.0–15.0)
LYMPHS PCT: 12 %
Lymphs Abs: 0.8 10*3/uL (ref 0.7–4.0)
MCH: 32.9 pg (ref 26.0–34.0)
MCHC: 31.7 g/dL (ref 30.0–36.0)
MCV: 103.8 fL — AB (ref 78.0–100.0)
Monocytes Absolute: 0.7 10*3/uL (ref 0.1–1.0)
Monocytes Relative: 10 %
NEUTROS PCT: 77 %
Neutro Abs: 5.1 10*3/uL (ref 1.7–7.7)
Platelets: 161 10*3/uL (ref 150–400)
RBC: 2.4 MIL/uL — AB (ref 3.87–5.11)
RDW: 22.4 % — ABNORMAL HIGH (ref 11.5–15.5)
WBC: 6.7 10*3/uL (ref 4.0–10.5)

## 2015-12-21 LAB — PROCALCITONIN

## 2015-12-21 LAB — STREP PNEUMONIAE URINARY ANTIGEN: Strep Pneumo Urinary Antigen: NEGATIVE

## 2015-12-21 LAB — MAGNESIUM: Magnesium: 1.5 mg/dL — ABNORMAL LOW (ref 1.7–2.4)

## 2015-12-21 MED ORDER — VANCOMYCIN HCL IN DEXTROSE 1-5 GM/200ML-% IV SOLN
1000.0000 mg | Freq: Two times a day (BID) | INTRAVENOUS | Status: DC
  Administered 2015-12-21 – 2015-12-22 (×4): 1000 mg via INTRAVENOUS
  Filled 2015-12-21 (×4): qty 200

## 2015-12-21 MED ORDER — POLYETHYLENE GLYCOL 3350 17 G PO PACK
17.0000 g | PACK | Freq: Every day | ORAL | Status: DC
  Administered 2015-12-23: 17 g via ORAL
  Filled 2015-12-21 (×2): qty 1

## 2015-12-21 MED ORDER — MAGNESIUM SULFATE 2 GM/50ML IV SOLN
2.0000 g | Freq: Four times a day (QID) | INTRAVENOUS | Status: AC
  Administered 2015-12-21 (×2): 2 g via INTRAVENOUS
  Filled 2015-12-21 (×2): qty 50

## 2015-12-21 MED ORDER — MAGNESIUM HYDROXIDE 400 MG/5ML PO SUSP: 30.0000 mL | Freq: Once | ORAL | Status: DC

## 2015-12-21 MED ORDER — POTASSIUM CHLORIDE 10 MEQ/100ML IV SOLN
10.0000 meq | INTRAVENOUS | Status: DC
  Administered 2015-12-21: 10 meq via INTRAVENOUS
  Filled 2015-12-21: qty 100

## 2015-12-21 MED ORDER — ONDANSETRON HCL 4 MG/2ML IJ SOLN
4.0000 mg | Freq: Four times a day (QID) | INTRAMUSCULAR | Status: DC | PRN
  Administered 2015-12-21 – 2015-12-23 (×2): 4 mg via INTRAVENOUS
  Filled 2015-12-21 (×2): qty 2

## 2015-12-21 MED ORDER — ALTEPLASE 2 MG IJ SOLR
2.0000 mg | Freq: Once | INTRAMUSCULAR | Status: AC
  Administered 2015-12-21: 2 mg
  Filled 2015-12-21: qty 2

## 2015-12-21 MED ORDER — CETYLPYRIDINIUM CHLORIDE 0.05 % MT LIQD
7.0000 mL | Freq: Two times a day (BID) | OROMUCOSAL | Status: DC
  Administered 2015-12-21 – 2015-12-22 (×2): 7 mL via OROMUCOSAL

## 2015-12-21 MED ORDER — SODIUM CHLORIDE 0.9 % IJ SOLN
10.0000 mL | INTRAMUSCULAR | Status: DC | PRN
  Administered 2015-12-22 – 2015-12-23 (×3): 10 mL
  Filled 2015-12-21 (×3): qty 40

## 2015-12-21 MED ORDER — CHLORHEXIDINE GLUCONATE 0.12 % MT SOLN
15.0000 mL | Freq: Two times a day (BID) | OROMUCOSAL | Status: DC
  Administered 2015-12-21 – 2015-12-23 (×3): 15 mL via OROMUCOSAL
  Filled 2015-12-21 (×5): qty 15

## 2015-12-21 MED ORDER — POTASSIUM CHLORIDE CRYS ER 20 MEQ PO TBCR
60.0000 meq | EXTENDED_RELEASE_TABLET | Freq: Four times a day (QID) | ORAL | Status: AC
  Administered 2015-12-21 (×3): 60 meq via ORAL
  Filled 2015-12-21 (×3): qty 3

## 2015-12-21 MED ORDER — SODIUM CHLORIDE 0.9 % IJ SOLN: 10.0000 mL | Freq: Two times a day (BID) | INTRAMUSCULAR | Status: DC

## 2015-12-21 MED ORDER — ALBUTEROL SULFATE (2.5 MG/3ML) 0.083% IN NEBU
2.5000 mg | INHALATION_SOLUTION | RESPIRATORY_TRACT | Status: DC | PRN
  Administered 2015-12-21 – 2015-12-22 (×2): 2.5 mg via RESPIRATORY_TRACT
  Filled 2015-12-21 (×2): qty 3

## 2015-12-21 NOTE — Progress Notes (Signed)
Inpatient [facility] Rm 1439 [place] and [facility] RN Visit. GIP related admission to [facility] DX: Breast Cancer, pt. is a Partial Code status. Patient seen in her room sleeping with her husband at the bedside. Patient currently on O2 at 3L [place]. Resps regular and unlabored. Pt.'s husband reports she takes sips of juice and has a good appetite. Pt. was admitted last night after a few days of progressive weakness and a cough. Liaison talked with staff RN who states pt. will probably stay another night. Husband Jeneen Rinks advised [facility] RN will f/u tomorrow and daily while she is in the hospital.  Med list and transfer summary placed on shadow chart. Please call with any hospice questions.  [facility] Liaison [phone]

## 2015-12-21 NOTE — Consult Note (Signed)
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 Patient has recently been enrolled with hospice and palliative care center of [facility]. Patient lives at home and is taken care of by her husband and her sister. Husband and sister make decisions jointly with regards to the patient. Patient has underlying history of diabetes. She is admitted for possible healthcare associated pneumonia. Palliative consult for patient being a hospice patient as well as for pain management. The patient reportedly was placed on 25 g of fentanyl patch. Patient has oxycodone immediate release for pain control.  Discussed extensively with patient, spouse Mena Simonis as well as patient's sister. They wish to not resume the fentanyl patch. They state that the patient was having severe nausea as well as constipation when she was taking both fentanyl patch in addition to oxycodone immediate release. They state that the patient has a good quality of life at home in spite of her extensive breast cancer diagnosis. They're appreciative of hospice support at home and plan to continue. Oxycodone immediate release for pain. Continue bowel and antiemetic regimen. Continue hospice support at home. No other additional recommendations. Thank you for the consult.  Contacts/Participants in Discussion: Primary Decision Maker: Spouse   Relationship to Patient Alexxus Sobh spouse HCPOA: yes    SUMMARY OF RECOMMENDATIONS: Pain management: Discussed extensively with the patient, sister, met with husband Donyelle Enyeart  at the bedside. They would like to not resume fentanyl patch. Oral oxycodone on an as-needed basis for pain management. Hospice and palliative care center of [facility] following at home  [place] is partial code. See below  Code Status/Advance Care Planning: Limited code    Code Status Orders        Start     Ordered   12/20/15 2311  Limited resuscitation (code)   Continuous    Question Answer Comment  In the event of cardiac or respiratory ARREST: Initiate Code Blue, Call Rapid Response Yes   In the event of cardiac or respiratory ARREST: Perform CPR Yes   In the event of cardiac or respiratory ARREST: Perform Intubation/Mechanical Ventilation No   In the event of cardiac or respiratory ARREST: Use NIPPV/BiPAp only if indicated Yes   In the event of cardiac or respiratory ARREST: Administer ACLS medications if indicated Yes   In the event of cardiac or respiratory ARREST: Perform Defibrillation or Cardioversion if indicated Yes   Comments Does not want to be kept on pressors or ventilator      12/20/15 2318      Other Directives: Unknown  Symptom Management:   As above  Palliative Prophylaxis:   Bowel Regimen  Additional Recommendations (Limitations, Scope, Preferences):  Continue current treatment    Psycho-social/Spiritual:  Support System: Strong Desire for further Chaplaincy support:no Additional Recommendations: Continue hospice support at home  Prognosis: < 6 months  Discharge Planning: Home with Hospice   Chief Complaint/ Primary Diagnoses: Present on Admission:  . HCAP (healthcare-associated pneumonia) . Anemia, unspecified . Breast cancer metastasized to multiple sites [facility]) . Wound of right breast  I have reviewed the medical record, interviewed the patient and family, and examined the patient. The following aspects are  pertinent.  Past Medical History  Diagnosis Date  . Arthritis   . Depression   . Fainting   . Headache   .  Hypertension   . Migraines   . History of blood transfusion   . Gastric ulcer   . Breast cancer metastasized to multiple sites [facility]) 04/12/2014    10/30/15 radiation, chemotherapy  . Seizures [facility])    Social History   Social History  . Marital Status: Married    Spouse Name: N/A  . Number of Children: 0  . Years of Education: N/A   Occupational History  .      no work for 4 to 5 years   Social History Main Topics  . Smoking status: Never Smoker   . Smokeless tobacco: Never Used     Comment: never used tobacco  . Alcohol Use: No  . Drug Use: No  . Sexual Activity: Not Currently   Other Topics Concern  . None   Social History Narrative   Family History  Problem Relation Age of Onset  . Arthritis Other   . Hypertension Mother   . Hypertension Other   . Heart disease Other   . Stroke Father   . Cancer Sister     leukemia   Scheduled Meds: . antiseptic oral rinse  7 mL Mouth Rinse q12n4p  . aztreonam  2 g Intravenous 3 times per day  . chlorhexidine  15 mL Mouth Rinse BID  . dexamethasone  2 mg Oral Q12H  . enoxaparin (LOVENOX) injection  40 mg Subcutaneous QHS  . fluoxymesterone  10 mg Oral Daily  . gabapentin  300 mg Oral Daily  . insulin aspart  0-15 Units Subcutaneous TID WC  . levETIRAcetam  500 mg Oral BID  . magnesium sulfate 1 - 4 g bolus IVPB  2 g Intravenous Q6H  . potassium chloride  60 mEq Oral Q6H  . senna  1 tablet Oral Daily  . sodium chloride  10-40 mL Intracatheter Q12H  . tobramycin  2 drop Right Eye 4 times per day  . vancomycin  1,000 mg Intravenous Q12H   Continuous Infusions: . 0.9 % NaCl with KCl 20 mEq / L 75 mL/hr at 12/21/15 0056   PRN Meds:.albuterol, nystatin, oxyCODONE, sodium chloride Medications Prior to Admission:  Prior to Admission medications   Medication Sig Start Date End Date Taking? Authorizing Provider  ACCU-CHEK AVIVA PLUS test strip USE TO CHECK BLOOD GLUCOSE 4 TIMES DAILY AFTER MEALS AND AT BEDTIME 10/10/15  Yes  Historical Provider, MD  ACCU-CHEK SOFTCLIX LANCETS lancets USE TO CHECK BLOOD GLUCOSE 4 TIMES DAILY 10/10/15  Yes Historical Provider, MD  Blood Glucose Monitoring Suppl (ACCU-CHEK AVIVA PLUS) W/DEVICE KIT USE TO CHECK BLOOD GLUCOSE 4 TIMES DAILY 10/10/15  Yes Historical Provider, MD  [employer] SENNA 8.6 MG tablet Take 1 tablet by mouth daily.  12/08/15  Yes Historical Provider, MD  dexamethasone (DECADRON) 2 MG tablet Take 1 tablet (2 mg total) by mouth every 12 (twelve) hours. 11/10/15  Yes Nita Sells, MD  fentaNYL (DURAGESIC - DOSED MCG/HR) 25 MCG/HR patch Place 1 patch (25 mcg total) onto the skin every 3 (three) days. 12/06/15  Yes Eliezer Bottom, NP  fluoxymesterone (HALOTESTIN) 10 MG tablet Take 1 tablet (10 mg total) by mouth 2 (two) times daily. Patient taking differently: Take 10 mg by mouth daily.  11/15/15  Yes Volanda Napoleon, MD  gabapentin (NEURONTIN) 300 MG capsule Take 1 capsule (300 mg total) by mouth  3 (three) times daily. Patient taking differently: Take 300 mg by mouth daily.  08/28/15  Yes Volanda Napoleon, MD  glucose monitoring kit (FREESTYLE) monitoring kit 1 each by Does not apply route 4 (four) times daily - after meals and at bedtime. 1 month Diabetic Testing Supplies for QAC-QHS accuchecks. 10/09/15  Yes Shanker Kristeen Mans, MD  insulin aspart (NOVOLOG FLEXPEN) 100 UNIT/ML FlexPen 0-9 Units, Subcutaneous, 3 times daily with meals CBG < 70: call MD CBG 70 - 120: 0 units CBG 121 - 150: 1 unit CBG 151 - 200: 2 units CBG 201 - 250: 3 units CBG 251 - 300: 5 units CBG 301 - 350: 7 units CBG 351 - 400: 9 units CBG > 400: call MD  Okay to substitute with different band at equivalent dosing 10/09/15  Yes Shanker Kristeen Mans, MD  Insulin Pen Needle 32G X 8 MM MISC Please provide 2 month supply. Okay to substitute with different brand 10/09/15  Yes Shanker Kristeen Mans, MD  levETIRAcetam (KEPPRA) 500 MG tablet Take 1 tablet (500 mg total) by mouth 2 (two) times daily. 11/10/15   Yes Nita Sells, MD  mometasone (ELOCON) 0.1 % cream Apply to lips 2x a day Patient taking differently: Apply 1 application topically daily as needed (irritation). Apply to lips 2x a day 11/15/15  Yes Volanda Napoleon, MD  nystatin (MYCOSTATIN) 100000 UNIT/ML suspension Take 5 mLs (500,000 Units total) by mouth 4 (four) times daily as needed (mouth pain). 11/10/15  Yes Nita Sells, MD  ondansetron (ZOFRAN) 8 MG tablet Take 1 tablet (8 mg total) by mouth 2 (two) times daily. Start the day after chemo for 3 days. Then take as needed for nausea or vomiting. 10/09/15  Yes Shanker Kristeen Mans, MD  oxyCODONE (OXY IR/ROXICODONE) 5 MG immediate release tablet Take 1-3 tablets (5-15 mg total) by mouth every 6 (six) hours as needed for severe pain. Take 1-2 if needed for pain due to cancer. 11/10/15  Yes Nita Sells, MD  potassium chloride SA (K-DUR,KLOR-CON) 20 MEQ tablet Take 1 tablet (20 mEq total) by mouth daily. 11/10/15  Yes Nita Sells, MD  PROAIR HFA 108 (90 BASE) MCG/ACT inhaler Inhale 2 puffs into the lungs every 6 (six) hours as needed. Shortness of breath/wheezing 12/11/15  Yes Historical Provider, MD  sodium hypochlorite (DAKIN'S 1/2 STRENGTH) external solution Use with each dressing change 11/11/15  Yes Volanda Napoleon, MD  tobramycin (TOBREX) 0.3 % ophthalmic solution Place 2 drops into the right eye every 6 (six) hours. 11/10/15  Yes Nita Sells, MD  ciprofloxacin (CIPRO) 500 MG tablet Take 1 tablet (500 mg total) by mouth 2 (two) times daily. Patient not taking: Reported on 12/20/2015 11/29/15   Tatyana Kirichenko, PA-C  clindamycin (CLEOCIN) 300 MG capsule Take 1 capsule (300 mg total) by mouth every 8 (eight) hours. Patient not taking: Reported on 12/20/2015 11/10/15   Nita Sells, MD  feeding supplement, ENSURE ENLIVE, (ENSURE ENLIVE) LIQD Take 237 mLs by mouth 2 (two) times daily between meals. Patient not taking: Reported on 12/20/2015 11/10/15    Nita Sells, MD  insulin glargine (LANTUS) 100 UNIT/ML injection Inject 0.3 mLs (30 Units total) into the skin daily at 10 pm. Patient not taking: Reported on 12/20/2015 11/10/15   Nita Sells, MD  pantoprazole (PROTONIX) 40 MG tablet Take 1 tablet (40 mg total) by mouth 2 (two) times daily. Patient not taking: Reported on 12/20/2015 10/09/15   Jonetta Osgood, MD   Allergies  Allergen Reactions  .  Hydrocodone     "makes me crawl on the floor"  . Penicillins Itching    Per pt's sister, swelling of throat Has patient had a PCN reaction causing immediate rash, facial/tongue/throat swelling, SOB or lightheadedness with hypotension: No, just itching Has patient had a PCN reaction causing severe rash involving mucus membranes or skin necrosis: No Has patient had a PCN reaction that required hospitalization No Has patient had a PCN reaction occurring within the last 10 years: Yes If all of the above answers are "NO", then may proceed with Cephalosporin use.   . Aspirin Other (See Comments)    Due to ulcers    Review of Systems Pain generalized  Physical Exam Weak African-American lady resting in bed complaints of generalized pain Lungs clear anteriorly S1-S2 Edema bilateral lower extremities Skin is warm and dry Awake alert answers all questions appropriately  Vital Signs: BP 120/92 mmHg  Pulse 110  Temp(Src) 98.2 F (36.8 C) (Oral)  Resp 18  Ht 5' 5"  (1.651 m)  Wt 77.111 kg (170 lb)  BMI 28.29 kg/m2  SpO2 100%  SpO2: SpO2: 100 % O2 Device:SpO2: 100 % O2 Flow Rate: .O2 Flow Rate (L/min): 3 L/min  IO: Intake/output summary:  Intake/Output Summary (Last 24 hours) at 12/21/15 1120 Last data filed at 12/21/15 0700  Gross per 24 hour  Intake    725 ml  Output   1800 ml  Net  -1075 ml    LBM: Last BM Date: 12/20/15 Baseline Weight: Weight: 77.111 kg (170 lb) Most recent weight: Weight: 77.111 kg (170 lb)      Palliative Assessment/Data:  Flowsheet  Rows        Most Recent Value   Intake Tab    Referral Department  Hospitalist   Palliative Care Primary Diagnosis  Cancer   Palliative Care Type  New Palliative care   Reason for referral  Pain   Date first seen by Palliative Care  12/21/15   Clinical Assessment    Palliative Performance Scale Score  30%   Pain Max last 24 hours  7   Pain Min Last 24 hours  6   Psychosocial & Spiritual Assessment    Palliative Care Outcomes       Additional Data Reviewed:  CBC:    Component Value Date/Time   WBC 6.7 12/21/2015 0450   WBC 9.3 12/06/2015 0849   HGB 7.9* 12/21/2015 0450   HGB 10.0* 12/06/2015 0849   HCT 24.9* 12/21/2015 0450   HCT 32.4* 12/06/2015 0849   PLT 161 12/21/2015 0450   PLT 294 12/06/2015 0849   MCV 103.8* 12/21/2015 0450   MCV 103* 12/06/2015 0849   NEUTROABS 5.1 12/21/2015 0450   NEUTROABS 6.8* 12/06/2015 0849   LYMPHSABS 0.8 12/21/2015 0450   LYMPHSABS 1.4 12/06/2015 0849   MONOABS 0.7 12/21/2015 0450   EOSABS 0.1 12/21/2015 0450   EOSABS 0.0 12/06/2015 0849   BASOSABS 0.0 12/21/2015 0450   BASOSABS 0.0 12/06/2015 0849   Comprehensive Metabolic Panel:    Component Value Date/Time   NA 142 12/21/2015 0450   NA 140 12/06/2015 0850   NA 136 11/01/2015 1328   K 2.7* 12/21/2015 0450   K 3.1* 12/06/2015 0850   K 3.5 11/01/2015 1328   CL 114* 12/21/2015 0450   CL 102 11/01/2015 1328   CO2 21* 12/21/2015 0450   CO2 21* 12/06/2015 0850   CO2 22 11/01/2015 1328   BUN 9 12/21/2015 0450   BUN 13.9 12/06/2015 0850  BUN 19 11/01/2015 1328   CREATININE 0.32* 12/21/2015 0450   CREATININE 0.6 12/06/2015 0850   CREATININE 0.4* 11/01/2015 1328   GLUCOSE 64* 12/21/2015 0450   GLUCOSE 150* 12/06/2015 0850   GLUCOSE 387* 11/01/2015 1328   CALCIUM 6.9* 12/21/2015 0450   CALCIUM 8.3* 12/06/2015 0850   CALCIUM 8.4 11/01/2015 1328   AST 57* 12/21/2015 0450   AST 45* 12/06/2015 0850   AST 82* 11/01/2015 1328   ALT 81* 12/21/2015 0450   ALT 40 12/06/2015 0850     ALT 272* 11/01/2015 1328   ALKPHOS 112 12/21/2015 0450   ALKPHOS 138 12/06/2015 0850   ALKPHOS 272* 11/01/2015 1328   BILITOT 1.0 12/21/2015 0450   BILITOT 0.60 12/06/2015 0850   BILITOT 1.70* 11/01/2015 1328   PROT 4.1* 12/21/2015 0450   PROT 5.1* 12/06/2015 0850   PROT 5.3* 11/01/2015 1328   ALBUMIN 1.8* 12/21/2015 0450   ALBUMIN 2.1* 12/06/2015 0850   ALBUMIN 2.0* 11/01/2015 1328     Time In: 10 Time Out 11 Time Total 60 Greater than 50%  of this time was spent counseling and coordinating care related to the above assessment and plan.  Signed by: Loistine Chance, MD [phone] Loistine Chance, MD  12/21/2015, 11:20 AM  Please contact Palliative Medicine Team phone at [phone] for questions and concerns.

## 2015-12-21 NOTE — Progress Notes (Signed)
CRITICAL VALUE ALERT  Critical value received:  K+ 2.7  Date of notification:  12/21/2015  Time of notification:  [id number]  Critical value read back:Yes.    Nurse who received alert:  J.Joie Reamer  MD notified (1st page):  T.Callahan  Time of first page:  0545  MD notified (2nd page):  Time of second page:  Responding MD:  T.Callahan  Time MD responded:  [phone], ordered 3 runs of IV potassium

## 2015-12-21 NOTE — Progress Notes (Signed)
CSW received consult for advanced directives. CSW informed spiritual care.   Belia Heman, [place]  [phone]

## 2015-12-21 NOTE — Evaluation (Signed)
Physical Therapy Evaluation Patient Details Name: Mary [name] MRN: [id number] DOB: June 13, 1956 Today's Date: 12/21/2015   History of Present Illness  Mary [name] is a 59 y.o. female with PMH of triple positive breast cancer with metastases to bone, lung, and brain; seizures; HTN; migraines; arthritis who presented to the ED with progressive generalized weakness with dyspnea and nonproductive cough. Dx of PNA.   Clinical Impression  Pt admitted with above diagnosis. Pt currently with functional limitations due to the deficits listed below (see PT Problem List). +2 total assist for supine to/from sit. Pt sat on EOB x 4 min with min to mod assist for sitting balance. Per husband pt has been bed bound for several months due to seizures. HR 115, SaO2 90% on RA, RR 26 with activity.  Pt will benefit from skilled PT to increase their independence and safety with mobility to allow discharge to the venue listed below.       Follow Up Recommendations Home health PT    Equipment Recommendations  None recommended by PT    Recommendations for Other Services       Precautions / Restrictions Precautions Precautions: Fall Precaution Comments: non ambulatory x 4 months Restrictions Weight Bearing Restrictions: No      Mobility  Bed Mobility Overal bed mobility: +2 for physical assistance;Needs Assistance Bed Mobility: Supine to Sit;Sit to Supine     Supine to sit: +2 for physical assistance;Total assist Sit to supine: +2 for physical assistance;Total assist   General bed mobility comments: pt 10%, assist to raise trunk and advance BLEs  Transfers                 General transfer comment: NT  Ambulation/Gait                Stairs            Wheelchair Mobility    Modified Rankin (Stroke Patients Only)       Balance Overall balance assessment: Needs assistance Sitting-balance support: Bilateral upper extremity supported;Feet supported Sitting  balance-Leahy Scale: Poor Sitting balance - Comments: min/guard to mod A for sitting balance, pt leans left, able to maintain neutral for short periods with BUE/LE support and verbal cues; sat on EOB x 4 minutes, tolerance limited by fatigue, RR 26, SaO2 90% on RA, HR 115 with sitting                                     Pertinent Vitals/Pain Pain Assessment: No/denies pain    Home Living Family/patient expects to be discharged to:: Private residence Living Arrangements: Spouse/significant other Available Help at Discharge: Family;Other (Comment);Available 24 hours/day (sister) Type of Home: House Home Access: Level entry     Home Layout: One level Home Equipment: Kasandra Knudsen - quad;Walker - 2 wheels;Wheelchair - manual      Prior Function Level of Independence: Needs assistance   Gait / Transfers Assistance Needed: husband reports pt has been bed bound for about 4 months due to seizures  ADL's / Homemaking Assistance Needed: assist for bathing/dressing        Hand Dominance        Extremity/Trunk Assessment   Upper Extremity Assessment: Generalized weakness           Lower Extremity Assessment: Generalized weakness (knee extension +3/5 bilaterally, pt reports numbness in finger tips and toes)      Cervical / Trunk Assessment:  Kyphotic  Communication   Communication: No difficulties  Cognition Arousal/Alertness: Awake/alert Behavior During Therapy: WFL for tasks assessed/performed Overall Cognitive Status: Within Functional Limits for tasks assessed                      General Comments      Exercises General Exercises - Lower Extremity Ankle Circles/Pumps: AROM;Both;10 reps Long Arc Quad: AROM;Both;5 reps;Seated Heel Slides: AAROM;Both;10 reps;Supine Hip ABduction/ADduction: AAROM;Both;10 reps;Supine      Assessment/Plan    PT Assessment Patient needs continued PT services  PT Diagnosis Generalized weakness   PT Problem List  Decreased strength;Decreased activity tolerance;Decreased balance;Decreased mobility  PT Treatment Interventions Functional mobility training;Therapeutic activities;Patient/family education;Balance training;Therapeutic exercise;Wheelchair mobility training   PT Goals (Current goals can be found in the Care Plan section) Acute Rehab PT Goals Patient Stated Goal: to get stronger PT Goal Formulation: With patient/family Time For Goal Achievement: 01/04/16 Potential to Achieve Goals: Fair    Frequency Min 3X/week   Barriers to discharge        Co-evaluation               End of Session   Activity Tolerance: Patient limited by fatigue Patient left: in bed;with call bell/phone within reach;with bed alarm set Nurse Communication: Mobility status         Time: [id number] PT Time Calculation (min) (ACUTE ONLY): 23 min   Charges:   PT Evaluation $Initial PT Evaluation Tier I: 1 Procedure PT Treatments $Therapeutic Activity: 8-22 mins   PT G Codes:        Philomena Doheny 12/21/2015, 1:35 PM [phone]

## 2015-12-21 NOTE — Progress Notes (Signed)
ANTIBIOTIC CONSULT NOTE - INITIAL  Pharmacy Consult for vancomycin, renal antibiotic adjustment Indication: HCAP  Allergies  Allergen Reactions  . Hydrocodone     "makes me crawl on the floor"  . Penicillins Itching    Per pt's sister, swelling of throat Has patient had a PCN reaction causing immediate rash, facial/tongue/throat swelling, SOB or lightheadedness with hypotension: No, just itching Has patient had a PCN reaction causing severe rash involving mucus membranes or skin necrosis: No Has patient had a PCN reaction that required hospitalization No Has patient had a PCN reaction occurring within the last 10 years: Yes If all of the above answers are "NO", then may proceed with Cephalosporin use.   . Aspirin Other (See Comments)    Due to ulcers    Patient Measurements: Height: 5\' 5"  ([id number] cm) Weight: 170 lb (77.111 kg) IBW/kg (Calculated) : 57 Adjusted Body Weight:   Vital Signs: Temp: 98 F (36.7 C) (12/24 0004) Temp Source: Axillary (12/24 0004) BP: 150/94 mmHg (12/24 0004) Pulse Rate: 114 (12/24 0004) Intake/Output from previous day: 12/23 0701 - 12/24 0700 In: -  Out: 500 [Urine:500] Intake/Output from this shift: Total I/O In: -  Out: 500 [Urine:500]  Labs:  Recent Labs  12/20/15 2120  WBC 10.2  HGB 11.6*  PLT 258  CREATININE 0.56   Estimated Creatinine Clearance: 77.7 mL/min (by C-G formula based on Cr of 0.56). No results for input(s): VANCOTROUGH, VANCOPEAK, VANCORANDOM, GENTTROUGH, GENTPEAK, GENTRANDOM, TOBRATROUGH, TOBRAPEAK, TOBRARND, AMIKACINPEAK, AMIKACINTROU, AMIKACIN in the last 72 hours.   Microbiology: Recent Results (from the past 720 hour(s))  Urine culture     Status: None   Collection Time: 11/29/15  8:36 AM  Result Value Ref Range Status   Specimen Description URINE, CATHETERIZED  Final   Special Requests NONE  Final   Culture   Final    >=100,000 COLONIES/mL PROTEUS MIRABILIS Performed at [facility]    Report  Status 12/01/2015 FINAL  Final   Organism ID, Bacteria PROTEUS MIRABILIS  Final      Susceptibility   Proteus mirabilis - MIC*    AMPICILLIN <=2 SENSITIVE Sensitive     CEFAZOLIN <=4 SENSITIVE Sensitive     CEFTRIAXONE <=1 SENSITIVE Sensitive     CIPROFLOXACIN <=0.25 SENSITIVE Sensitive     GENTAMICIN 4 SENSITIVE Sensitive     IMIPENEM 2 SENSITIVE Sensitive     NITROFURANTOIN 64 RESISTANT Resistant     TRIMETH/SULFA <=20 SENSITIVE Sensitive     AMPICILLIN/SULBACTAM <=2 SENSITIVE Sensitive     PIP/TAZO <=4 SENSITIVE Sensitive     * >=100,000 COLONIES/mL PROTEUS MIRABILIS    Medical History: Past Medical History  Diagnosis Date  . Arthritis   . Depression   . Fainting   . Headache   . Hypertension   . Migraines   . History of blood transfusion   . Gastric ulcer   . Breast cancer metastasized to multiple sites [facility]) 04/12/2014    10/30/15 radiation, chemotherapy  . Seizures ([place])     Medications:  Anti-infectives    Start     Dose/Rate Route Frequency Ordered Stop   12/21/15 1000  vancomycin (VANCOCIN) IVPB 1000 mg/200 mL premix     1,000 mg 200 mL/hr over 60 Minutes Intravenous Every 12 hours 12/21/15 0244     12/21/15 0600  aztreonam (AZACTAM) 2 g in dextrose 5 % 50 mL IVPB     2 g 100 mL/hr over 30 Minutes Intravenous 3 times per day 12/20/15 2353 12/29/15  0559   12/20/15 2330  aztreonam (AZACTAM) 2 g in dextrose 5 % 50 mL IVPB  Status:  Discontinued     2 g 100 mL/hr over 30 Minutes Intravenous 3 times per day 12/20/15 2318 12/20/15 2352   12/20/15 2245  vancomycin (VANCOCIN) IVPB 1000 mg/200 mL premix     1,000 mg 200 mL/hr over 60 Minutes Intravenous  Once 12/20/15 2231 12/21/15 0008   12/20/15 2230  aztreonam (AZACTAM) 2 g in dextrose 5 % 50 mL IVPB     2 g 100 mL/hr over 30 Minutes Intravenous  Once 12/20/15 2217 12/20/15 2307     Assessment: Patient with breast cancer and metastases to bone, lung and brain in ED with weakness, SOB and cough.  Goal of  Therapy:  Vancomycin trough level 15-20 mcg/ml  Aztreonam dosed based on patient weight and renal function   Plan:  Measure antibiotic drug levels at steady state Follow up culture results Vancomycin 1gm iv q12hr  Continue aztreonam 2gm iv q8hr  Tyler Deis, [place] Kutzer [place] 12/21/2015,2:46 AM

## 2015-12-21 NOTE — Progress Notes (Signed)
Chaplain in hospital when referral for Advanced Directive received. Pt was asleep, husband unaware of a need for an AD and said he would ask a nurse to page the chaplain should one be needed.  Mary [name]. Ann Bohne, [place]

## 2015-12-21 NOTE — Progress Notes (Addendum)
[facility] PROGRESS NOTE   Mary [name] [id number] DOB: 05-13-1956 DOA: 12/20/2015 PCP: Sandi Mariscal, MD  HPI/Subjective: Patient is very emotional, frustrated that she was not able to do a lot for herself at home. Has generalized weakness, likely to low potassium, and could be because of the anemia as well.  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 Pulmonary edema less likely given low BNP, no JVD, no gallop  - PE considered, Well's score is 4 as patient is bedbound with active malignancy and tachycardia. Will hold off on further PE workup for now as patient is stable, PNA is more likely, and full anticoagulation would be precluded by numerous brain metastases with evidence of hemorrhage.  - Given beta-lactam allergy, treating with vancomycin and aztreonam with pharmacy consultation - Follow-up blood and sputum cultures, urine antigens  - Monitoring on continuous telemetry, pulse oximetry  - IS q2h wa - Supplemental O2 as needed - Trial of albuterol nebs - Cannot rule out worsening of pulmonary metastatic disease  Oropharyngeal candidiasis  - Continue nystatin   Widely metastatic breast cancer  - Continue fluoxymesterone and Decadron - Continue Keppra for seizure prophylaxis  - Pain control with oxycodone  - Palliative consult for symptom management  - Social work consulted for clarification of advanced directives   Insulin-dependent diabetes - Patient on sliding scale correctional only at home  - CBGs with meals and at bedtime  - Place on moderate sliding scale insulin, to be adjusted as needed - A1c pending -  Carbohydrate consistent diet.  UTI -Urinalysis consistent with UTI, recent Proteus mirabilis UTI, on aztreonam check urine cultures. -Adjust antibiotics according to culture results.  Constipation -Likely opioid related constipation, start on MiraLAX and one dose of milk of magnesia. -If no good bowel movement and will start Relistor.  Hypokalemia Severe hypokalemia of potassium 2.7, we'll replete aggressively with oral supplements, check BMP in a.m.  Hypomagnesemia Replete with parenteral supplements.  Code Status: Partial Code Family Communication: Plan discussed with the patient. Disposition Plan: Remains inpatient Diet: Diet Heart Room service appropriate?: Yes; Fluid consistency:: Thin  Consultants:  Palliative care  Procedures:  None  Antibiotics:  On vancomycin and aztreonam   Objective: Filed Vitals:   12/21/15 0004 12/21/15 0605  BP: 150/94 120/92  Pulse: 114 110  Temp: 98 F (36.7 C) 98.2 F (36.8 C)  Resp: 18 18    Intake/Output Summary (Last 24 hours) at 12/21/15 1150 Last data filed at 12/21/15 0700  Gross per 24 hour  Intake    725 ml  Output   1800 ml  Net  -1075 ml   Filed Weights   12/20/15 2236  Weight: 77.111 kg (170 lb)    Exam: General: Alert and awake, oriented x3, not in any acute distress. HEENT: anicteric sclera, pupils reactive to light and accommodation, EOMI CVS: S1-S2 clear, no murmur rubs or gallops Chest: clear to auscultation bilaterally, no wheezing, rales or rhonchi Abdomen: soft nontender, nondistended, normal bowel sounds, no organomegaly Extremities: no cyanosis, clubbing or edema noted bilaterally Neuro: Cranial nerves II-XII intact, no focal neurological deficits  Data Reviewed: Basic Metabolic Panel:  Recent Labs Lab 12/20/15 2120 12/21/15 0450  NA 145 142  K 3.4* 2.7*  CL 109 114*  CO2 27 21*  GLUCOSE 88 64*  BUN 13 9  CREATININE 0.56 0.32*  CALCIUM 8.3* 6.9*  MG  --  1.5*   Liver Function  Tests:  Recent Labs Lab 12/20/15 2120 12/21/15 0450  AST 73* 57*  ALT 107* 81*  ALKPHOS 151* 112  BILITOT 0.8 1.0  PROT 5.1* 4.1*  ALBUMIN 2.3* 1.8*   No results for input(s): LIPASE, AMYLASE in the last 168 hours. No results for input(s): AMMONIA in the last 168 hours. CBC:  Recent Labs Lab 12/20/15 2120 12/21/15 0450  WBC 10.2 6.7  NEUTROABS 7.8* 5.1  HGB 11.6* 7.9*  HCT 37.1 24.9*  MCV 106.0* 103.8*  PLT 258 161   Cardiac Enzymes:  Recent Labs Lab 12/20/15 2120  TROPONINI <0.03   BNP (last 3 results)  Recent Labs  12/20/15 2120  BNP 29.5    ProBNP (last 3 results) No results for input(s): PROBNP in the last 8760 hours.  CBG:  Recent Labs Lab 12/21/15 0925  GLUCAP 84    Micro No results found for this or any previous visit (from the past 240 hour(s)).   Studies: Dg Chest 2 View  12/20/2015  CLINICAL DATA:  Acute onset of generalized weakness and shortness of breath. Cough. Initial encounter. EXAM: CHEST  2 VIEW COMPARISON:  Chest radiograph performed 11/09/2015, and CT of the chest, abdomen and pelvis from 09/04/2015 FINDINGS: The lungs are well-aerated. Small bilateral pleural effusions are noted, with bilateral central airspace opacities. This may reflect multifocal pneumonia or pulmonary edema. Known underlying pulmonary metastases are difficult to fully assess on radiograph. No pneumothorax is seen. The heart is normal in size; the patient's left-sided chest port is noted ending about the mid SVC. Known osseous metastatic disease is not well characterized on radiograph. IMPRESSION: 1. Small bilateral pleural effusions, with bilateral central airspace opacities. This may reflect multifocal pneumonia or pulmonary edema. 2. Known underlying pulmonary metastases and bony metastases are not well characterized on radiograph. Electronically Signed   By: Garald Balding M.D.   On: 12/20/2015 21:37    Scheduled Meds: . antiseptic oral rinse  7 mL Mouth  Rinse q12n4p  . aztreonam  2 g Intravenous 3 times per day  . chlorhexidine  15 mL Mouth Rinse BID  . dexamethasone  2 mg Oral Q12H  . enoxaparin (LOVENOX) injection  40 mg Subcutaneous QHS  . fluoxymesterone  10 mg Oral Daily  . gabapentin  300 mg Oral Daily  . insulin aspart  0-15 Units Subcutaneous TID WC  . levETIRAcetam  500 mg Oral BID  . magnesium sulfate 1 - 4 g bolus IVPB  2 g Intravenous Q6H  . potassium chloride  60 mEq Oral Q6H  . senna  1 tablet Oral Daily  . sodium chloride  10-40 mL Intracatheter Q12H  . tobramycin  2 drop Right Eye 4 times per day  . vancomycin  1,000 mg Intravenous Q12H   Continuous Infusions: . 0.9 % NaCl with KCl 20 mEq / L 75 mL/hr at 12/21/15 0056       Time spent: 35 minutes    [facility] A  [facility] Pager [phone] If 7PM-7AM, please contact night-coverage at [email or web address], password [facility] 12/21/2015, 11:50 AM  LOS: 1 day

## 2015-12-22 DIAGNOSIS — N3000 Acute cystitis without hematuria: Secondary | ICD-10-CM

## 2015-12-22 DIAGNOSIS — C50911 Malignant neoplasm of unspecified site of right female breast: Secondary | ICD-10-CM

## 2015-12-22 DIAGNOSIS — K5900 Constipation, unspecified: Secondary | ICD-10-CM

## 2015-12-22 LAB — BASIC METABOLIC PANEL
ANION GAP: 7 (ref 5–15)
BUN: 10 mg/dL (ref 6–20)
CALCIUM: 8.6 mg/dL — AB (ref 8.9–10.3)
CO2: 26 mmol/L (ref 22–32)
Chloride: 107 mmol/L (ref 101–111)
Creatinine, Ser: 0.4 mg/dL — ABNORMAL LOW (ref 0.44–1.00)
GLUCOSE: 114 mg/dL — AB (ref 65–99)
Potassium: 5.4 mmol/L — ABNORMAL HIGH (ref 3.5–5.1)
Sodium: 140 mmol/L (ref 135–145)

## 2015-12-22 LAB — MAGNESIUM: MAGNESIUM: 2.5 mg/dL — AB (ref 1.7–2.4)

## 2015-12-22 LAB — GLUCOSE, CAPILLARY
GLUCOSE-CAPILLARY: 109 mg/dL — AB (ref 65–99)
GLUCOSE-CAPILLARY: 130 mg/dL — AB (ref 65–99)
Glucose-Capillary: 110 mg/dL — ABNORMAL HIGH (ref 65–99)
Glucose-Capillary: 113 mg/dL — ABNORMAL HIGH (ref 65–99)

## 2015-12-22 NOTE — Progress Notes (Signed)
[facility] PROGRESS NOTE   Mary [name] [id number] DOB: 1956/01/31 DOA: 12/20/2015 PCP: Sandi Mariscal, MD  HPI/Subjective: Seen with the husband at bedside, feels much better than yesterday. Potassium seems to be overcorrected at 5.4, well continue IV fluids and monitor.  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 Pulmonary edema less likely given low BNP, no JVD, no gallop  - PE considered, Well's score is 4 as patient is bedbound with active malignancy and tachycardia. Will hold off on further PE workup for now as patient is stable, PNA is more likely, and full anticoagulation would be precluded by numerous brain metastases with evidence of hemorrhage.  - Given beta-lactam allergy, treating with vancomycin and aztreonam with pharmacy consultation - Follow-up blood and sputum cultures, urine antigens  - Monitoring on continuous telemetry, pulse oximetry  - IS q2h wa - Supplemental O2 as needed - Trial of albuterol nebs - Cannot rule out worsening of pulmonary metastatic disease, IV fluids discontinued.  Oropharyngeal candidiasis  - Continue nystatin   Widely metastatic breast cancer  - Continue fluoxymesterone and Decadron - Continue Keppra for seizure prophylaxis  - Pain control with oxycodone  - Palliative consult for symptom management  - Social work consulted for clarification of advanced directives   Insulin-dependent diabetes - Patient on sliding scale correctional only at home  - CBGs with meals and at bedtime  - Place on moderate sliding scale insulin, to be adjusted as needed - A1c pending - Carbohydrate  consistent diet.  UTI -Urinalysis consistent with UTI, recent Proteus mirabilis UTI, on aztreonam check urine cultures. -Adjust antibiotics according to culture results.  Constipation -Likely opioid related constipation, start on MiraLAX and one dose of milk of magnesia. -Charted 2 bowel movements yesterday.  Hypokalemia Severe hypokalemia of potassium 2.7 This is aggressively repleted with oral supplements, potassium is 5.4 today.  Hypomagnesemia Replete with parenteral supplements, magnesium is 2.5 today..  Code Status: Partial Code Family Communication: Plan discussed with the patient. Disposition Plan: Remains inpatient Diet: Diet Heart Room service appropriate?: Yes; Fluid consistency:: Thin  Consultants:  Palliative care  Procedures:  None  Antibiotics:  On vancomycin and aztreonam   Objective: Filed Vitals:   12/21/15 2258 12/22/15 0719  BP: 141/87 145/85  Pulse: 82 85  Temp: 97.7 F (36.5 C) 98.5 F (36.9 C)  Resp: 20 20    Intake/Output Summary (Last 24 hours) at 12/22/15 0926 Last data filed at 12/22/15 0646  Gross per 24 hour  Intake   1970 ml  Output   1850 ml  Net    120 ml   Filed Weights   12/20/15 2236  Weight: 77.111 kg (170 lb)    Exam: General: Alert and awake, oriented x3, not in any acute distress. HEENT: anicteric sclera, pupils reactive to light and accommodation, EOMI CVS: S1-S2 clear, no murmur rubs or gallops Chest: clear to auscultation bilaterally, no wheezing, rales or rhonchi Abdomen: soft nontender, nondistended, normal bowel sounds, no organomegaly Extremities: no cyanosis, clubbing or edema noted bilaterally Neuro: Cranial nerves II-XII intact, no focal neurological deficits  Data Reviewed: Basic Metabolic Panel:  Recent Labs Lab 12/20/15 2120 12/21/15 0450 12/22/15 0429  NA 145  142 140  K 3.4* 2.7* 5.4*  CL 109 114* 107  CO2 27 21* 26  GLUCOSE 88 64* 114*  BUN 13 9 10   CREATININE 0.56 0.32* 0.40*    CALCIUM 8.3* 6.9* 8.6*  MG  --  1.5* 2.5*   Liver Function Tests:  Recent Labs Lab 12/20/15 2120 12/21/15 0450  AST 73* 57*  ALT 107* 81*  ALKPHOS 151* 112  BILITOT 0.8 1.0  PROT 5.1* 4.1*  ALBUMIN 2.3* 1.8*   No results for input(s): LIPASE, AMYLASE in the last 168 hours. No results for input(s): AMMONIA in the last 168 hours. CBC:  Recent Labs Lab 12/20/15 2120 12/21/15 0450  WBC 10.2 6.7  NEUTROABS 7.8* 5.1  HGB 11.6* 7.9*  HCT 37.1 24.9*  MCV 106.0* 103.8*  PLT 258 161   Cardiac Enzymes:  Recent Labs Lab 12/20/15 2120  TROPONINI <0.03   BNP (last 3 results)  Recent Labs  12/20/15 2120  BNP 29.5    ProBNP (last 3 results) No results for input(s): PROBNP in the last 8760 hours.  CBG:  Recent Labs Lab 12/21/15 0925 12/21/15 1207 12/21/15 1728 12/21/15 2254 12/22/15 0735  GLUCAP 84 107* 167* 122* 110*    Micro Recent Results (from the past 240 hour(s))  Culture, blood (routine x 2)     Status: None (Preliminary result)   Collection Time: 12/20/15 10:18 PM  Result Value Ref Range Status   Specimen Description BLOOD BLOOD RIGHT HAND  Final   Special Requests BOTTLES DRAWN AEROBIC AND ANAEROBIC 5CC  Final   Culture   Final    NO GROWTH < 12 HOURS Performed at [facility]    Report Status PENDING  Incomplete  Culture, blood (routine x 2)     Status: None (Preliminary result)   Collection Time: 12/20/15 10:23 PM  Result Value Ref Range Status   Specimen Description BLOOD BLOOD LEFT HAND  Final   Special Requests BOTTLES DRAWN AEROBIC AND ANAEROBIC 5ML  Final   Culture   Final    NO GROWTH < 12 HOURS Performed at [facility]    Report Status PENDING  Incomplete     Studies: Dg Chest 2 View  12/20/2015  CLINICAL DATA:  Acute onset of generalized weakness and shortness of breath. Cough. Initial encounter. EXAM: CHEST  2 VIEW COMPARISON:  Chest radiograph performed 11/09/2015, and CT of the chest, abdomen and pelvis from  09/04/2015 FINDINGS: The lungs are well-aerated. Small bilateral pleural effusions are noted, with bilateral central airspace opacities. This may reflect multifocal pneumonia or pulmonary edema. Known underlying pulmonary metastases are difficult to fully assess on radiograph. No pneumothorax is seen. The heart is normal in size; the patient's left-sided chest port is noted ending about the mid SVC. Known osseous metastatic disease is not well characterized on radiograph. IMPRESSION: 1. Small bilateral pleural effusions, with bilateral central airspace opacities. This may reflect multifocal pneumonia or pulmonary edema. 2. Known underlying pulmonary metastases and bony metastases are not well characterized on radiograph. Electronically Signed   By: Garald Balding M.D.   On: 12/20/2015 21:37    Scheduled Meds: . antiseptic oral rinse  7 mL Mouth Rinse q12n4p  . aztreonam  2 g Intravenous 3 times per day  . chlorhexidine  15 mL Mouth Rinse BID  . dexamethasone  2 mg Oral Q12H  . enoxaparin (LOVENOX) injection  40 mg Subcutaneous QHS  . fluoxymesterone  10 mg Oral Daily  . gabapentin  300 mg  Oral Daily  . insulin aspart  0-15 Units Subcutaneous TID WC  . levETIRAcetam  500 mg Oral BID  . magnesium hydroxide  30 mL Oral Once  . polyethylene glycol  17 g Oral Daily  . senna  1 tablet Oral Daily  . sodium chloride  10-40 mL Intracatheter Q12H  . tobramycin  2 drop Right Eye 4 times per day  . vancomycin  1,000 mg Intravenous Q12H   Continuous Infusions:       Time spent: 35 minutes    [facility] A  [facility] Pager [phone] If 7PM-7AM, please contact night-coverage at [email or web address], password [facility] 12/22/2015, 9:26 AM  LOS: 2 days

## 2015-12-22 NOTE — Progress Notes (Signed)
Pt has small hole on right lateral breast with malodorous purulent drainage.  MD aware.

## 2015-12-23 LAB — GLUCOSE, CAPILLARY
GLUCOSE-CAPILLARY: 93 mg/dL (ref 65–99)
GLUCOSE-CAPILLARY: 109 mg/dL — AB (ref 65–99)

## 2015-12-23 LAB — CBC
HEMATOCRIT: 31.1 % — AB (ref 36.0–46.0)
Hemoglobin: 9.7 g/dL — ABNORMAL LOW (ref 12.0–15.0)
MCH: 32.8 pg (ref 26.0–34.0)
MCHC: 31.2 g/dL (ref 30.0–36.0)
MCV: 105.1 fL — AB (ref 78.0–100.0)
PLATELETS: 284 10*3/uL (ref 150–400)
RBC: 2.96 MIL/uL — AB (ref 3.87–5.11)
RDW: 21.2 % — AB (ref 11.5–15.5)
WBC: 10.3 10*3/uL (ref 4.0–10.5)

## 2015-12-23 LAB — BASIC METABOLIC PANEL
Anion gap: 7 (ref 5–15)
BUN: 14 mg/dL (ref 6–20)
CHLORIDE: 106 mmol/L (ref 101–111)
CO2: 26 mmol/L (ref 22–32)
CREATININE: 0.41 mg/dL — AB (ref 0.44–1.00)
Calcium: 8.8 mg/dL — ABNORMAL LOW (ref 8.9–10.3)
Glucose, Bld: 138 mg/dL — ABNORMAL HIGH (ref 65–99)
POTASSIUM: 4.5 mmol/L (ref 3.5–5.1)
SODIUM: 139 mmol/L (ref 135–145)

## 2015-12-23 LAB — URINE CULTURE: Culture: >=100000

## 2015-12-23 LAB — LEGIONELLA ANTIGEN, URINE

## 2015-12-23 LAB — PROCALCITONIN

## 2015-12-23 MED ORDER — FUROSEMIDE 10 MG/ML IJ SOLN
40.0000 mg | Freq: Once | INTRAMUSCULAR | Status: AC
  Administered 2015-12-23: 40 mg via INTRAVENOUS
  Filled 2015-12-23: qty 4

## 2015-12-23 MED ORDER — BISACODYL 10 MG RE SUPP
10.0000 mg | Freq: Once | RECTAL | Status: AC
  Administered 2015-12-23: 10 mg via RECTAL
  Filled 2015-12-23: qty 1

## 2015-12-23 MED ORDER — FLUCONAZOLE 100 MG PO TABS: 100.0000 mg | ORAL_TABLET | Freq: Every day | ORAL | Status: DC

## 2015-12-23 MED ORDER — POLYETHYLENE GLYCOL 3350 17 G PO PACK: 17.0000 g | PACK | Freq: Every day | ORAL | Status: AC

## 2015-12-23 MED ORDER — OXYCODONE HCL 5 MG PO TABS: 5.0000 mg | ORAL_TABLET | Freq: Four times a day (QID) | ORAL | Status: DC | PRN

## 2015-12-23 MED ORDER — LEVOFLOXACIN 750 MG PO TABS: 750.0000 mg | ORAL_TABLET | Freq: Every day | ORAL | Status: DC

## 2015-12-23 MED ORDER — POTASSIUM CHLORIDE CRYS ER 20 MEQ PO TBCR
40.0000 meq | EXTENDED_RELEASE_TABLET | Freq: Once | ORAL | Status: AC
  Administered 2015-12-23: 40 meq via ORAL
  Filled 2015-12-23: qty 2

## 2015-12-23 MED ORDER — HEPARIN SOD (PORK) LOCK FLUSH 100 UNIT/ML IV SOLN
500.0000 [IU] | INTRAVENOUS | Status: AC | PRN
  Administered 2015-12-23: 500 [IU]

## 2015-12-23 NOTE — Progress Notes (Signed)
CSW received notification that pt needing ambulance transport home.   CSW met with pt and pt spouse at bedside and confirmed address.  CSW arranged ambulance transport for pt home via [facility] scheduled for 1 pm pick up.  No further social work needs identified at this time.  CSW signing off.   Alison Murray, MSW, [place] Work [phone]

## 2015-12-23 NOTE — Progress Notes (Signed)
Physical Therapy Treatment Patient Details Name: Mary [name] MRN: [id number] DOB: 1956/02/15 Today's Date: 12/23/2015    History of Present Illness Mary [name] is a 59 y.o. female with PMH of triple positive breast cancer with metastases to bone, lung, and brain; seizures; HTN; migraines; arthritis who presented to the ED with progressive generalized weakness with dyspnea and nonproductive cough. Dx of PNA.     PT Comments    +2 for supine to sit, sat on EOB 90 seconds, limited by nausea/pain. Performed BLE AAROM exercises in bed. Nausea meds requested.   Follow Up Recommendations  Home health PT     Equipment Recommendations  None recommended by PT    Recommendations for Other Services       Precautions / Restrictions Precautions Precautions: Fall Precaution Comments: non ambulatory x 4 months Restrictions Weight Bearing Restrictions: No    Mobility  Bed Mobility Overal bed mobility: +2 for physical assistance;Needs Assistance Bed Mobility: Supine to Sit;Sit to Supine     Supine to sit: +2 for physical assistance;Total assist Sit to supine: +2 for physical assistance;Total assist   General bed mobility comments: pt 10%, assist to raise trunk and advance BLEs; sat on EOB 90 sec, tolerance limited by abdominal pain/nausea; RN notified  Transfers                 General transfer comment: NT  Ambulation/Gait                 Stairs            Wheelchair Mobility    Modified Rankin (Stroke Patients Only)       Balance     Sitting balance-Leahy Scale: Fair Sitting balance - Comments: sat on EOB 90 sec, initially fair balance, then poor, limited by 8/10 abdominal pain (husband states due to contipation); pain/ nausea meds requested                            Cognition Arousal/Alertness: Awake/alert Behavior During Therapy: WFL for tasks assessed/performed Overall Cognitive Status: Within Functional Limits for tasks  assessed                      Exercises General Exercises - Lower Extremity Heel Slides: AAROM;Both;10 reps;Supine Hip ABduction/ADduction: AAROM;Both;10 reps;Supine    General Comments        Pertinent Vitals/Pain Pain Assessment: 0-10 Pain Score: 8  Pain Location: abdomen Pain Descriptors / Indicators: Aching Pain Intervention(s): Limited activity within patient's tolerance;Monitored during session;Repositioned;Patient requesting pain meds-RN notified (also requested nausea meds)    Home Living                      Prior Function            PT Goals (current goals can now be found in the care plan section) Acute Rehab PT Goals Patient Stated Goal: to get stronger PT Goal Formulation: With patient/family Time For Goal Achievement: 01/04/16 Potential to Achieve Goals: Fair Progress towards PT goals: Not progressing toward goals - comment (nausea limiting tolerance)    Frequency  Min 3X/week    PT Plan Current plan remains appropriate    Co-evaluation             End of Session   Activity Tolerance: Patient limited by fatigue;Patient limited by pain Patient left: in bed;with call bell/phone within reach;with bed alarm set;with family/visitor present  Time: [id number] PT Time Calculation (min) (ACUTE ONLY): 17 min  Charges:  $Therapeutic Activity: 8-22 mins                    G Codes:      Philomena Doheny 12/23/2015, 10:01 AM [phone]

## 2015-12-23 NOTE — Progress Notes (Signed)
Pt complaining of abdominal pain and holding stomach. Pt husband concerned she may be constipated and asked me to page the on-call for a laxative. Paged the on-call and got a one time order for dulcolax suppository. Will continue to monitor and pass on to next shift.

## 2015-12-23 NOTE — Discharge Summary (Signed)
Physician Discharge Summary  [name] XLK:[id number] DOB: 03-20-1956 DOA: 12/20/2015  PCP: Sandi Mariscal, MD  Admit date: 12/20/2015 Discharge date: 12/23/2015  Time spent: 40 minutes  Recommendations for Outpatient Follow-up:  1. Follow-up with Hospice and [place] service. 2. Fentanyl patch discontinued. 3. Patient needs wound care for right breast wound. 4. Levofloxacin 750 mg and Diflucan 100 mg for 5 more days.   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 These symptoms have worsened acutely over the past several days with no relieving or aggravating factors identified. She denies pleuritic pain, calf tenderness or swelling, palpitations, or hemoptysis. Ms. Navis also denies fevers or chills, sick contacts, or recent travel. She is currently on home hospice while she continues chemotherapy and radiation.   In ED, patient was found to be afebrile, saturating in the 90s on room air, and normotensive, and initially without tachycardia or tachypnea. She was given nebulized breathing treatment in the ED before becoming tachycardic. There was no leukocytosis on CBC, BNP was 30, and troponin undetectable. Two-view chest x-ray demonstrated bilateral airspace opacities centrally, consistent with pulmonary edema vs  multifocal infiltrate. Blood cultures were obtained and she was started on empiric treatment for suspected HCAP. She remained hemodynamically stable in the emergency department and the hospitalists were asked to admit.  Hospital Course:   Suspected HCAP  -DDx favors multifocal pneumonia. Pulmonary edema less likely given low BNP, no JVD, no gallop  -PE considered, Well's score is 4 as patient is bedbound with active malignancy and tachycardia. Will hold off on further PE workup for now as patient is stable, PNA is more likely, and full anticoagulation would be precluded by numerous brain metastases with evidence of hemorrhage.  -Given beta-lactam allergy, treating with vancomycin and aztreonam with pharmacy consultation -Managed also supportive management with bronchodilators, mucolytics, antitussives and oxygen as needed. -Patient did very well without fever or leukocytosis, on discharge levofloxacin for 5 more days.  Oropharyngeal candidiasis  - Continue nystatin   Widely metastatic breast cancer  - Continue fluoxymesterone and Decadron - Continue Keppra for seizure prophylaxis  - Pain control with oxycodone  - Palliative consult for symptom management , recommended to stop fentanyl patch and continue only as needed OxyIR.  Insulin-dependent diabetes - Patient on sliding scale correctional only at home  - CBGs with meals and at bedtime  - Place on moderate sliding scale insulin, to be adjusted as needed - Hemoglobin A1c from October 2016 is 12.6, uncontrolled diabetes.  UTI -Urinalysis consistent with UTI, recent Proteus mirabilis UTI, on aztreonam check urine cultures. -Urine culture showed only yeast, discharged on Diflucan for 5 more days.  Constipation -Likely opioid related constipation, start on MiraLAX and one dose of milk of magnesia. -Charted 2 bowel movements yesterday. -Please consider Movantik or Relistor if she continues to have opioid-induced  constipation.  Hypokalemia Severe hypokalemia of potassium 2.7 This is aggressively corrected with oral supplements.  Hypomagnesemia Replete with parenteral supplements, magnesium is 2.5 today.  Right breast wound Patient has right breast cancer, new wound with purulent drainage. She was recently on Cipro and clindamycin for that. This is related to the cancer. Anyway patient discharge levofloxacin for her pneumonia should be covering for the wound.   Procedures:  None  Consultations:  Hospice and palliative care  Discharge Exam: Filed Vitals:   12/22/15 2239 12/23/15 0610  BP: 135/73 148/86  Pulse: 69 55  Temp: 97.7 F (36.5 C) 97.6 F (36.4 C)  Resp: 20 20   General: Alert and awake, oriented x3, not in any acute distress. HEENT: anicteric sclera, pupils reactive to light and accommodation, EOMI CVS: S1-S2 clear, no murmur rubs or gallops Chest: clear to auscultation bilaterally, no wheezing, rales or rhonchi Abdomen: soft nontender, nondistended, normal bowel sounds, no organomegaly Extremities: no cyanosis, clubbing or edema noted bilaterally Neuro: Cranial nerves II-XII intact, no focal neurological deficits  Discharge Instructions   Discharge Instructions    Diet - low sodium heart healthy    Complete by:  As directed      Increase activity slowly    Complete by:  As directed           Current Discharge Medication List    START taking these medications   Details  fluconazole (DIFLUCAN) 100 MG tablet Take 1 tablet (100 mg total) by mouth daily. Qty: 5 tablet, Refills: 0    levofloxacin (LEVAQUIN) 750 MG tablet Take 1 tablet (750 mg total) by mouth daily. Qty: 5 tablet, Refills: 0    polyethylene glycol (MIRALAX) packet Take 17 g by mouth daily. Qty: 14 each, Refills: 0      CONTINUE these medications which have CHANGED   Details  oxyCODONE (OXY IR/ROXICODONE) 5 MG immediate release tablet Take 1-3 tablets (5-15 mg total) by mouth every 6 (six)  hours as needed for severe pain. Qty: 30 tablet, Refills: 0   Associated Diagnoses: Breast cancer metastasized to multiple sites, unspecified laterality ([place]); Brain metastases ([place]); Breast cancer metastasized to multiple sites, right ([place])      CONTINUE these medications which have NOT CHANGED   Details  ACCU-CHEK AVIVA PLUS test strip USE TO CHECK BLOOD GLUCOSE 4 TIMES DAILY AFTER MEALS AND AT BEDTIME Refills: 0    ACCU-CHEK SOFTCLIX LANCETS lancets USE TO CHECK BLOOD GLUCOSE 4 TIMES DAILY Refills: 0    Blood Glucose Monitoring Suppl (ACCU-CHEK AVIVA PLUS) W/DEVICE KIT USE TO CHECK BLOOD GLUCOSE 4 TIMES DAILY Refills: 0    [employer] SENNA 8.6 MG tablet Take 1 tablet by mouth daily.  Refills: 0    dexamethasone (DECADRON) 2 MG tablet Take 1 tablet (2 mg total) by mouth every 12 (twelve) hours. Qty: 30 tablet, Refills: 0   Associated Diagnoses: Wound of right breast, initial encounter    fluoxymesterone (HALOTESTIN) 10 MG tablet Take 1 tablet (10 mg total) by mouth 2 (two) times daily. Qty: 60 tablet, Refills: 2   Associated Diagnoses: Breast cancer metastasized to multiple sites, unspecified laterality ([place]); Rash on lips    gabapentin (NEURONTIN) 300 MG capsule Take 1 capsule (300 mg total) by mouth 3 (three) times daily. Qty: 90 capsule, Refills: 4   Associated Diagnoses: Peripheral neuropathy ([place]); Breast cancer metastasized to multiple sites, left (H[facility])    glucose monitoring kit (FREESTYLE) monitoring kit 1 each by Does not apply route 4 (four) times  daily - after meals and at bedtime. 1 month Diabetic Testing Supplies for QAC-QHS accuchecks. Qty: 1 each, Refills: 1    insulin aspart (NOVOLOG FLEXPEN) 100 UNIT/ML FlexPen 0-9 Units, Subcutaneous, 3 times daily with meals CBG < 70: call MD CBG 70 - 120: 0 units CBG 121 - 150: 1 unit CBG 151 - 200: 2 units CBG 201 - 250: 3 units CBG 251 - 300: 5 units CBG 301 - 350: 7 units CBG 351 - 400: 9 units CBG > 400: call MD  Okay to  substitute with different band at equivalent dosing Qty: 15 mL, Refills: 0    Insulin Pen Needle 32G X 8 MM MISC Please provide 2 month supply. Okay to substitute with different brand Qty: 100 each, Refills: 0    levETIRAcetam (KEPPRA) 500 MG tablet Take 1 tablet (500 mg total) by mouth 2 (two) times daily. Qty: 60 tablet, Refills: 0    mometasone (ELOCON) 0.1 % cream Apply to lips 2x a day Qty: 45 g, Refills: 1   Associated Diagnoses: Rash on lips; Breast cancer metastasized to multiple sites, unspecified laterality (H[facility])    nystatin (MYCOSTATIN) 100000 UNIT/ML suspension Take 5 mLs (500,000 Units total) by mouth 4 (four) times daily as needed (mouth pain). Qty: 60 mL, Refills: 0    ondansetron (ZOFRAN) 8 MG tablet Take 1 tablet (8 mg total) by mouth 2 (two) times daily. Start the day after chemo for 3 days. Then take as needed for nausea or vomiting. Qty: 20 tablet, Refills: 0   Associated Diagnoses: Breast cancer metastasized to multiple sites, right (HCC)    potassium chloride SA (K-DUR,KLOR-CON) 20 MEQ tablet Take 1 tablet (20 mEq total) by mouth daily. Qty: 30 tablet, Refills: 0   Associated Diagnoses: Breast cancer metastasized to multiple sites, right ([place]); Brain metastases (H[facility])    PROAIR HFA 108 (90 BASE) MCG/ACT inhaler Inhale 2 puffs into the lungs every 6 (six) hours as needed. Shortness of breath/wheezing Refills: 2    sodium hypochlorite (DAKIN'S 1/2 STRENGTH) external solution Use with each dressing change Qty: 473 mL, Refills: 0   Associated Diagnoses: Wound of right breast, initial encounter; Breast cancer metastasized to multiple sites, right (HCC)    tobramycin (TOBREX) 0.3 % ophthalmic solution Place 2 drops into the right eye every 6 (six) hours. Qty: 5 mL, Refills: 0   Associated Diagnoses: Wound of right breast, initial encounter    feeding supplement, ENSURE ENLIVE, (ENSURE ENLIVE) LIQD Take 237 mLs by mouth 2 (two) times daily between meals. Qty: 237 mL,  Refills: 12    insulin glargine (LANTUS) 100 UNIT/ML injection Inject 0.3 mLs (30 Units total) into the skin daily at 10 pm. Qty: 10 mL, Refills: 11    pantoprazole (PROTONIX) 40 MG tablet Take 1 tablet (40 mg total) by mouth 2 (two) times daily. Qty: 60 tablet, Refills: 0   Associated Diagnoses: Iron deficiency anemia; Breast cancer metastasized to multiple sites, right (HCC)      STOP taking these medications     fentaNYL (DURAGESIC - DOSED MCG/HR) 25 MCG/HR patch      ciprofloxacin (CIPRO) 500 MG tablet      clindamycin (CLEOCIN) 300 MG capsule        Allergies  Allergen Reactions  . Hydrocodone     "makes me crawl on the floor"  . Penicillins Itching    Per pt's sister, swelling of throat Has patient had a PCN reaction causing immediate rash, facial/tongue/throat swelling, SOB or lightheadedness  with hypotension: No, just itching Has patient had a PCN reaction causing severe rash involving mucus membranes or skin necrosis: No Has patient had a PCN reaction that required hospitalization No Has patient had a PCN reaction occurring within the last 10 years: Yes If all of the above answers are "NO", then may proceed with Cephalosporin use.   . Aspirin Other (See Comments)    Due to ulcers   Follow-up Information    Follow up with Sandi Mariscal, MD In 1 week.   Specialty:  Internal Medicine   Contact information:   [address] [address] [phone]        The results of significant diagnostics from this hospitalization (including imaging, microbiology, ancillary and laboratory) are listed below for reference.    Significant Diagnostic Studies: Dg Chest 2 View  12/20/2015  CLINICAL DATA:  Acute onset of generalized weakness and shortness of breath. Cough. Initial encounter. EXAM: CHEST  2 VIEW COMPARISON:  Chest radiograph performed 11/09/2015, and CT of the chest, abdomen and pelvis from 09/04/2015 FINDINGS: The lungs are well-aerated. Small bilateral  pleural effusions are noted, with bilateral central airspace opacities. This may reflect multifocal pneumonia or pulmonary edema. Known underlying pulmonary metastases are difficult to fully assess on radiograph. No pneumothorax is seen. The heart is normal in size; the patient's left-sided chest port is noted ending about the mid SVC. Known osseous metastatic disease is not well characterized on radiograph. IMPRESSION: 1. Small bilateral pleural effusions, with bilateral central airspace opacities. This may reflect multifocal pneumonia or pulmonary edema. 2. Known underlying pulmonary metastases and bony metastases are not well characterized on radiograph. Electronically Signed   By: Garald Balding M.D.   On: 12/20/2015 21:37   Dg Abd 2 Views  11/29/2015  CLINICAL DATA:  Abdominal pain. History of metastatic breast cancer. EXAM: ABDOMEN - 2 VIEW COMPARISON:  09/04/2015 FINDINGS: Multiple areas of formed stool distending the colon. There is no evidence of bowel obstruction or perforation. No concerning intra-abdominal mass effect. Known osseous metastases, on this study best seen in the left ninth rib. IMPRESSION: Degree of stool retention suggest constipation. No evidence of bowel obstruction. Electronically Signed   By: Monte Fantasia M.D.   On: 11/29/2015 07:18    Microbiology: Recent Results (from the past 240 hour(s))  Culture, blood (routine x 2)     Status: None (Preliminary result)   Collection Time: 12/20/15 10:18 PM  Result Value Ref Range Status   Specimen Description BLOOD BLOOD RIGHT HAND  Final   Special Requests BOTTLES DRAWN AEROBIC AND ANAEROBIC 5CC  Final   Culture   Final    NO GROWTH 3 DAYS Performed at [facility]    Report Status PENDING  Incomplete  Culture, blood (routine x 2)     Status: None (Preliminary result)   Collection Time: 12/20/15 10:23 PM  Result Value Ref Range Status   Specimen Description BLOOD BLOOD LEFT HAND  Final   Special Requests BOTTLES  DRAWN AEROBIC AND ANAEROBIC 5ML  Final   Culture   Final    NO GROWTH 3 DAYS Performed at [facility]    Report Status PENDING  Incomplete  Culture, Urine     Status: None   Collection Time: 12/21/15 12:30 PM  Result Value Ref Range Status   Specimen Description URINE, CLEAN CATCH  Final   Special Requests NONE  Final   Culture   Final    >=100,000 COLONIES/mL YEAST Performed at  [facility]    Report Status 12/23/2015 FINAL  Final     Labs: Basic Metabolic Panel:  Recent Labs Lab 12/20/15 2120 12/21/15 0450 12/22/15 0429 12/23/15 0354  NA 145 142 140 139  K 3.4* 2.7* 5.4* 4.5  CL 109 114* 107 106  CO2 27 21* 26 26  GLUCOSE 88 64* 114* 138*  BUN _0 CREATININE 0.56 0.32* 0.40* 0.41*  CALCIUM 8.3* 6.9* 8.6* 8.8*  MG  --  1.5* 2.5*  --    Liver Function Tests:  Recent Labs Lab 12/20/15 2120 12/21/15 0450  AST 73* 57*  ALT 107* 81*  ALKPHOS 151* 112  BILITOT 0.8 1.0  PROT 5.1* 4.1*  ALBUMIN 2.3* 1.8*   No results for input(s): LIPASE, AMYLASE in the last 168 hours. No results for input(s): AMMONIA in the last 168 hours. CBC:  Recent Labs Lab 12/20/15 2120 12/21/15 0450 12/23/15 0354  WBC 10.2 6.7 10.3  NEUTROABS 7.8* 5.1  --   HGB 11.6* 7.9* 9.7*  HCT 37.1 24.9* 31.1*  MCV 106.0* 103.8* 105.1*  PLT 258 161 284   Cardiac Enzymes:  Recent Labs Lab 12/20/15 2120  TROPONINI <0.03   BNP: BNP (last 3 results)  Recent Labs  12/20/15 2120  BNP 29.5    ProBNP (last 3 results) No results for input(s): PROBNP in the last 8760 hours.  CBG:  Recent Labs Lab 12/22/15 0735 12/22/15 1141 12/22/15 1649 12/22/15 2235 12/23/15 0733  GLUCAP 110* 113* 109* 130* 93       Signed:  Caiden Monsivais A MD  [facility] 12/23/2015, 11:37 AM

## 2015-12-23 NOTE — Consult Note (Signed)
WOC wound consult note Reason for Consult: Fungating tumor to right breast. Home with Hospice. EMS at bedside when I arrived.  Unable to assess.  Wound type:Fungating right breast tumor Patient is discharging back to Hospice now.  Will not follow at this time.  Please re-consult if needed.  Domenic Moras RN BSN [place] Pager [phone]

## 2015-12-23 NOTE — Progress Notes (Signed)
[facility] Rm 1439 [facility]-RN Visit.  This is a GIP related admission to [facility] DX: Breast Cancer. Patient is a Partial Code status. Patient seen in her room, resting comfortably. Patient currently on RA. Respirations regular and unlabored. Patient stated she is going home today. Staff RN confirmed this.  Patient denies any additional DME needs in the home prior to discharge.  Patient denies any pain. She has received 2 doses of 10mg  and 1 doses of 15mg  Oxycodone IR po for pain the past 24 hours.  Please call with any hospice questions.  Freddi Starr RN, [facility] Liaison [phone]

## 2015-12-23 NOTE — Progress Notes (Signed)
Patient discharged home with Hospice care. Hospice was already following patient before admission. Discharge paperwork explained to patient & husband- husband verbalized understanding. Details written in beside of each medication stating reason for medication- also written when each next dose is due. Patient dressed in house coat per preference and transferred by stretcher via EMS. Husband travelling behind patient. Paper prescriptions also sent with patient.

## 2015-12-25 LAB — CULTURE, BLOOD (ROUTINE X 2)
CULTURE: NO GROWTH
CULTURE: NO GROWTH

## 2015-12-31 ENCOUNTER — Encounter (HOSPITAL_COMMUNITY): Payer: Self-pay | Admitting: Emergency Medicine

## 2015-12-31 ENCOUNTER — Emergency Department (HOSPITAL_COMMUNITY)

## 2015-12-31 ENCOUNTER — Observation Stay (HOSPITAL_COMMUNITY)
Admission: EM | Admit: 2015-12-31 | Discharge: 2016-01-02 | Disposition: A | Discharge status: Home / Self Care | Attending: Internal Medicine | Admitting: Internal Medicine

## 2015-12-31 DIAGNOSIS — Z79899 Other long term (current) drug therapy: Secondary | ICD-10-CM | POA: Insufficient documentation

## 2015-12-31 DIAGNOSIS — E43 Unspecified severe protein-calorie malnutrition: Secondary | ICD-10-CM | POA: Diagnosis present

## 2015-12-31 DIAGNOSIS — C7931 Secondary malignant neoplasm of brain: Secondary | ICD-10-CM | POA: Diagnosis present

## 2015-12-31 DIAGNOSIS — R410 Disorientation, unspecified: Secondary | ICD-10-CM | POA: Insufficient documentation

## 2015-12-31 DIAGNOSIS — R748 Abnormal levels of other serum enzymes: Secondary | ICD-10-CM | POA: Diagnosis not present

## 2015-12-31 DIAGNOSIS — K259 Gastric ulcer, unspecified as acute or chronic, without hemorrhage or perforation: Secondary | ICD-10-CM | POA: Insufficient documentation

## 2015-12-31 DIAGNOSIS — Z853 Personal history of malignant neoplasm of breast: Secondary | ICD-10-CM | POA: Insufficient documentation

## 2015-12-31 DIAGNOSIS — M1990 Unspecified osteoarthritis, unspecified site: Secondary | ICD-10-CM | POA: Diagnosis not present

## 2015-12-31 DIAGNOSIS — I10 Essential (primary) hypertension: Secondary | ICD-10-CM | POA: Diagnosis not present

## 2015-12-31 DIAGNOSIS — Z794 Long term (current) use of insulin: Secondary | ICD-10-CM | POA: Diagnosis not present

## 2015-12-31 DIAGNOSIS — R000 Tachycardia, unspecified: Secondary | ICD-10-CM | POA: Insufficient documentation

## 2015-12-31 DIAGNOSIS — C50919 Malignant neoplasm of unspecified site of unspecified female breast: Secondary | ICD-10-CM | POA: Diagnosis present

## 2015-12-31 DIAGNOSIS — C50911 Malignant neoplasm of unspecified site of right female breast: Secondary | ICD-10-CM | POA: Diagnosis not present

## 2015-12-31 DIAGNOSIS — G43909 Migraine, unspecified, not intractable, without status migrainosus: Secondary | ICD-10-CM | POA: Diagnosis not present

## 2015-12-31 DIAGNOSIS — F329 Major depressive disorder, single episode, unspecified: Secondary | ICD-10-CM | POA: Diagnosis not present

## 2015-12-31 DIAGNOSIS — G9340 Encephalopathy, unspecified: Principal | ICD-10-CM | POA: Diagnosis present

## 2015-12-31 DIAGNOSIS — Z880 Allergy status to penicillin: Secondary | ICD-10-CM | POA: Insufficient documentation

## 2015-12-31 DIAGNOSIS — R531 Weakness: Secondary | ICD-10-CM | POA: Diagnosis present

## 2015-12-31 LAB — COMPREHENSIVE METABOLIC PANEL
ALT: 187 U/L — ABNORMAL HIGH (ref 14–54)
AST: 112 U/L — AB (ref 15–41)
Albumin: 2.8 g/dL — ABNORMAL LOW (ref 3.5–5.0)
Alkaline Phosphatase: 231 U/L — ABNORMAL HIGH (ref 38–126)
Anion gap: 10 (ref 5–15)
BILIRUBIN TOTAL: 1.2 mg/dL (ref 0.3–1.2)
BUN: 13 mg/dL (ref 6–20)
CHLORIDE: 107 mmol/L (ref 101–111)
CO2: 24 mmol/L (ref 22–32)
CREATININE: 0.44 mg/dL (ref 0.44–1.00)
Calcium: 8.8 mg/dL — ABNORMAL LOW (ref 8.9–10.3)
Glucose, Bld: 80 mg/dL (ref 65–99)
POTASSIUM: 3.8 mmol/L (ref 3.5–5.1)
Sodium: 141 mmol/L (ref 135–145)
TOTAL PROTEIN: 5.8 g/dL — AB (ref 6.5–8.1)

## 2015-12-31 LAB — CBC WITH DIFFERENTIAL/PLATELET
Basophils Absolute: 0 10*3/uL (ref 0.0–0.1)
Basophils Relative: 0 %
EOS PCT: 2 %
Eosinophils Absolute: 0.1 10*3/uL (ref 0.0–0.7)
HEMATOCRIT: 39.9 % (ref 36.0–46.0)
Hemoglobin: 12.7 g/dL (ref 12.0–15.0)
LYMPHS ABS: 1.1 10*3/uL (ref 0.7–4.0)
LYMPHS PCT: 15 %
MCH: 33.1 pg (ref 26.0–34.0)
MCHC: 31.8 g/dL (ref 30.0–36.0)
MCV: 103.9 fL — AB (ref 78.0–100.0)
MONO ABS: 0.8 10*3/uL (ref 0.1–1.0)
MONOS PCT: 10 %
NEUTROS ABS: 5.5 10*3/uL (ref 1.7–7.7)
Neutrophils Relative %: 73 %
PLATELETS: 261 10*3/uL (ref 150–400)
RBC: 3.84 MIL/uL — ABNORMAL LOW (ref 3.87–5.11)
RDW: 19.5 % — AB (ref 11.5–15.5)
WBC: 7.5 10*3/uL (ref 4.0–10.5)

## 2015-12-31 LAB — I-STAT CG4 LACTIC ACID, ED: LACTIC ACID, VENOUS: 1.52 mmol/L (ref 0.5–2.0)

## 2015-12-31 LAB — GLUCOSE, CAPILLARY: GLUCOSE-CAPILLARY: 76 mg/dL (ref 65–99)

## 2015-12-31 MED ORDER — ENSURE ENLIVE PO LIQD
237.0000 mL | Freq: Two times a day (BID) | ORAL | Status: DC
  Administered 2016-01-01: 237 mL via ORAL

## 2015-12-31 MED ORDER — LEVETIRACETAM 500 MG PO TABS
500.0000 mg | ORAL_TABLET | Freq: Two times a day (BID) | ORAL | Status: DC
  Administered 2015-12-31 – 2016-01-02 (×4): 500 mg via ORAL
  Filled 2015-12-31 (×4): qty 1

## 2015-12-31 MED ORDER — HALOPERIDOL LACTATE 5 MG/ML IJ SOLN: 0.5000 mg | INTRAMUSCULAR | Status: DC | PRN

## 2015-12-31 MED ORDER — POLYVINYL ALCOHOL 1.4 % OP SOLN
1.0000 [drp] | Freq: Four times a day (QID) | OPHTHALMIC | Status: DC | PRN
  Filled 2015-12-31 (×2): qty 15

## 2015-12-31 MED ORDER — DEXAMETHASONE 4 MG PO TABS
2.0000 mg | ORAL_TABLET | Freq: Two times a day (BID) | ORAL | Status: DC
  Administered 2015-12-31 – 2016-01-02 (×4): 2 mg via ORAL
  Filled 2015-12-31 (×4): qty 1

## 2015-12-31 MED ORDER — SODIUM CHLORIDE 0.9 % IV SOLN: INTRAVENOUS | Status: DC

## 2015-12-31 MED ORDER — POLYETHYLENE GLYCOL 3350 17 G PO PACK
17.0000 g | PACK | Freq: Every day | ORAL | Status: DC
  Administered 2015-12-31 – 2016-01-01 (×2): 17 g via ORAL
  Filled 2015-12-31 (×2): qty 1

## 2015-12-31 MED ORDER — ALBUTEROL SULFATE (2.5 MG/3ML) 0.083% IN NEBU: 2.5000 mg | INHALATION_SOLUTION | RESPIRATORY_TRACT | Status: DC | PRN

## 2015-12-31 MED ORDER — OXYCODONE HCL 20 MG/ML PO CONC
5.0000 mg | ORAL | Status: DC | PRN
  Administered 2016-01-02: 5 mg via SUBLINGUAL
  Filled 2015-12-31: qty 1

## 2015-12-31 MED ORDER — OXYCODONE HCL 5 MG/5ML PO SOLN
5.0000 mg | ORAL | Status: DC | PRN
Start: 2015-12-31 — End: 2016-01-02
  Administered 2016-01-01 – 2016-01-02 (×5): 5 mg via ORAL
  Filled 2015-12-31 (×7): qty 5

## 2015-12-31 MED ORDER — ACETAMINOPHEN 325 MG PO TABS: 650.0000 mg | ORAL_TABLET | Freq: Four times a day (QID) | ORAL | Status: DC | PRN

## 2015-12-31 MED ORDER — FLUOXYMESTERONE 10 MG PO TABS: 10.0000 mg | ORAL_TABLET | Freq: Two times a day (BID) | ORAL | Status: DC

## 2015-12-31 MED ORDER — ONDANSETRON 4 MG PO TBDP: 4.0000 mg | ORAL_TABLET | Freq: Four times a day (QID) | ORAL | Status: DC | PRN

## 2015-12-31 MED ORDER — LORAZEPAM 1 MG PO TABS: 1.0000 mg | ORAL_TABLET | ORAL | Status: DC | PRN

## 2015-12-31 MED ORDER — HALOPERIDOL LACTATE 2 MG/ML PO CONC
0.5000 mg | ORAL | Status: DC | PRN
  Filled 2015-12-31: qty 0.3

## 2015-12-31 MED ORDER — INSULIN ASPART 100 UNIT/ML ~~LOC~~ SOLN
0.0000 [IU] | Freq: Three times a day (TID) | SUBCUTANEOUS | Status: DC
  Administered 2016-01-01 (×2): 2 [IU] via SUBCUTANEOUS
  Administered 2016-01-01: 1 [IU] via SUBCUTANEOUS
  Administered 2016-01-02: 2 [IU] via SUBCUTANEOUS

## 2015-12-31 MED ORDER — SODIUM CHLORIDE 0.9 % IV BOLUS (SEPSIS)
1000.0000 mL | Freq: Once | INTRAVENOUS | Status: AC
  Administered 2015-12-31: 1000 mL via INTRAVENOUS

## 2015-12-31 MED ORDER — INSULIN GLARGINE 100 UNIT/ML ~~LOC~~ SOLN
10.0000 [IU] | Freq: Every day | SUBCUTANEOUS | Status: DC
  Filled 2015-12-31: qty 0.1

## 2015-12-31 MED ORDER — LORAZEPAM 2 MG/ML PO CONC: 1.0000 mg | ORAL | Status: DC | PRN

## 2015-12-31 MED ORDER — BISACODYL 10 MG RE SUPP: 10.0000 mg | RECTAL | Status: DC | PRN

## 2015-12-31 MED ORDER — ACETAMINOPHEN 650 MG RE SUPP: 650.0000 mg | Freq: Four times a day (QID) | RECTAL | Status: DC | PRN

## 2015-12-31 MED ORDER — SENNA 8.6 MG PO TABS: 1.0000 | ORAL_TABLET | Freq: Every evening | ORAL | Status: DC | PRN

## 2015-12-31 MED ORDER — GABAPENTIN 300 MG PO CAPS
300.0000 mg | ORAL_CAPSULE | Freq: Three times a day (TID) | ORAL | Status: DC
  Administered 2015-12-31 – 2016-01-02 (×5): 300 mg via ORAL
  Filled 2015-12-31 (×5): qty 1

## 2015-12-31 MED ORDER — HALOPERIDOL 0.5 MG PO TABS
0.5000 mg | ORAL_TABLET | ORAL | Status: DC | PRN
  Filled 2015-12-31: qty 1

## 2015-12-31 MED ORDER — DEXTROSE 5 % IV SOLN
INTRAVENOUS | Status: AC
  Administered 2015-12-31: via INTRAVENOUS

## 2015-12-31 MED ORDER — ONDANSETRON HCL 4 MG/2ML IJ SOLN: 4.0000 mg | Freq: Four times a day (QID) | INTRAMUSCULAR | Status: DC | PRN

## 2015-12-31 MED ORDER — LORAZEPAM 2 MG/ML IJ SOLN
1.0000 mg | INTRAMUSCULAR | Status: DC | PRN
  Administered 2016-01-02 (×2): 1 mg via INTRAVENOUS
  Filled 2015-12-31 (×3): qty 1

## 2015-12-31 NOTE — ED Notes (Signed)
PATIENT HAS A PORT AND WANTS IT ACCESS.

## 2015-12-31 NOTE — ED Notes (Signed)
Bed: WA02 Expected date:  Expected time:  Means of arrival:  Comments: Cirrhosis, ascites

## 2015-12-31 NOTE — Progress Notes (Signed)
RM: [place]                                [facility] MSW Note:  Met with pt's spouse Jeneen Rinks who recounted events leading to her ER visit. He reports that despite the visit from the Hospice RN, he desired her to be seen at the hospital. He is uncertain about whether or not he will be able to continue caring for her at home. He intends to discuss this further with pt's sister [name] who actively participates in pt's care. Sw will follow up with pt's spouse and sister regarding their concerns. Benedict Needy [place],LCSW

## 2015-12-31 NOTE — H&P (Signed)
[facility] History and Physical  ERICHA WHITTINGHAM QIO:[id number] DOB: 03/28/1956 DOA: 12/31/2015   PCP: Sandi Mariscal, MD  Specialists: [name] is her oncologist  Chief Complaint: Poor oral intake and worsening confusion  HPI: Mary [name] is a 60 y.o. female with past medical history of metastatic breast cancer with metastatic disease to the lungs, liver, brain, along with a history of diabetes. She was last hospitalized in December for pneumonia. She was sent home with hospice services. She was seen by palliative medicine at that time. Her prognosis is very poor. She is accompanied by her husband today. Apparently for the past day and a half for patient has had poor oral intake. She has been more and more confused. She has complained of headaches. And she has been hallucinating, calling out for her mother. She has been having coughing spells with eating and drinking. Hospice services come to home 3 times a week. Husband has been finding it difficult to take care of the patient at home. So he decided to call 911 today. Patient is confused. When she wakes up she appears to be in a lot of discomfort. Unable to obtain any history from the patient.  Home Medications: Prior to Admission medications   Medication Sig Start Date End Date Taking? Authorizing Provider  ACCU-CHEK AVIVA PLUS test strip USE TO CHECK BLOOD GLUCOSE 4 TIMES DAILY AFTER MEALS AND AT BEDTIME 10/10/15  Yes Historical Provider, MD  ACCU-CHEK SOFTCLIX LANCETS lancets USE TO CHECK BLOOD GLUCOSE 4 TIMES DAILY 10/10/15  Yes Historical Provider, MD  bisacodyl (DULCOLAX) 10 MG suppository Place 10 mg rectally as needed for moderate constipation.   Yes Historical Provider, MD  Blood Glucose Monitoring Suppl (ACCU-CHEK AVIVA PLUS) W/DEVICE KIT USE TO CHECK BLOOD GLUCOSE 4 TIMES DAILY 10/10/15  Yes Historical Provider, MD  [employer] SENNA 8.6 MG tablet Take 1 tablet by mouth daily.  12/08/15  Yes Historical Provider, MD  dexamethasone  (DECADRON) 2 MG tablet Take 1 tablet (2 mg total) by mouth every 12 (twelve) hours. 11/10/15  Yes Nita Sells, MD  Dextromethorphan-Guaifenesin (DM MAX MAXIMUM STRENGTH) 5-100 MG/5ML LIQD Take 20 mLs by mouth every 4 (four) hours as needed (cough/congestion.).   Yes Historical Provider, MD  feeding supplement, ENSURE ENLIVE, (ENSURE ENLIVE) LIQD Take 237 mLs by mouth 2 (two) times daily between meals. 11/10/15  Yes Nita Sells, MD  fluconazole (DIFLUCAN) 100 MG tablet Take 1 tablet (100 mg total) by mouth daily. 12/23/15  Yes Verlee Monte, MD  fluoxymesterone (HALOTESTIN) 10 MG tablet Take 1 tablet (10 mg total) by mouth 2 (two) times daily. Patient taking differently: Take 10 mg by mouth daily.  11/15/15  Yes Volanda Napoleon, MD  gabapentin (NEURONTIN) 300 MG capsule Take 1 capsule (300 mg total) by mouth 3 (three) times daily. Patient taking differently: Take 300 mg by mouth daily.  08/28/15  Yes Volanda Napoleon, MD  glucose monitoring kit (FREESTYLE) monitoring kit 1 each by Does not apply route 4 (four) times daily - after meals and at bedtime. 1 month Diabetic Testing Supplies for QAC-QHS accuchecks. 10/09/15  Yes Shanker Kristeen Mans, MD  haloperidol (HALDOL) 2 MG tablet Take 4 mg by mouth every 4 (four) hours as needed for agitation.   Yes Historical Provider, MD  insulin aspart (NOVOLOG FLEXPEN) 100 UNIT/ML FlexPen 0-9 Units, Subcutaneous, 3 times daily with meals CBG < 70: call MD CBG 70 - 120: 0 units CBG 121 - 150: 1 unit CBG 151 - 200: 2  units CBG 201 - 250: 3 units CBG 251 - 300: 5 units CBG 301 - 350: 7 units CBG 351 - 400: 9 units CBG > 400: call MD  Okay to substitute with different band at equivalent dosing 10/09/15  Yes Shanker Kristeen Mans, MD  insulin glargine (LANTUS) 100 UNIT/ML injection Inject 0.3 mLs (30 Units total) into the skin daily at 10 pm. 11/10/15  Yes Nita Sells, MD  Insulin Pen Needle 32G X 8 MM MISC Please provide 2 month supply. Okay to  substitute with different brand 10/09/15  Yes Shanker Kristeen Mans, MD  levETIRAcetam (KEPPRA) 500 MG tablet Take 1 tablet (500 mg total) by mouth 2 (two) times daily. 11/10/15  Yes Nita Sells, MD  levofloxacin (LEVAQUIN) 750 MG tablet Take 1 tablet (750 mg total) by mouth daily. 12/23/15  Yes Verlee Monte, MD  LORazepam (ATIVAN) 1 MG tablet Take 1 mg by mouth every 4 (four) hours as needed (agitation.).   Yes Historical Provider, MD  mineral oil enema Place 1 enema rectally as needed for severe constipation.   Yes Historical Provider, MD  mometasone (ELOCON) 0.1 % cream Apply to lips 2x a day Patient taking differently: Apply 1 application topically daily as needed (irritation). Apply to lips 2x a day 11/15/15  Yes Volanda Napoleon, MD  nystatin (MYCOSTATIN) 100000 UNIT/ML suspension Take 5 mLs (500,000 Units total) by mouth 4 (four) times daily as needed (mouth pain). 11/10/15  Yes Nita Sells, MD  ondansetron (ZOFRAN) 8 MG tablet Take 1 tablet (8 mg total) by mouth 2 (two) times daily. Start the day after chemo for 3 days. Then take as needed for nausea or vomiting. 10/09/15  Yes Shanker Kristeen Mans, MD  oxyCODONE (OXY IR/ROXICODONE) 5 MG immediate release tablet Take 1-3 tablets (5-15 mg total) by mouth every 6 (six) hours as needed for severe pain. 12/23/15  Yes Verlee Monte, MD  pantoprazole (PROTONIX) 40 MG tablet Take 1 tablet (40 mg total) by mouth 2 (two) times daily. 10/09/15  Yes Shanker Kristeen Mans, MD  polyethylene glycol [facility]) packet Take 17 g by mouth daily. 12/23/15  Yes Verlee Monte, MD  potassium chloride SA (K-DUR,KLOR-CON) 20 MEQ tablet Take 1 tablet (20 mEq total) by mouth daily. 11/10/15  Yes Nita Sells, MD  PROAIR HFA 108 (90 BASE) MCG/ACT inhaler Inhale 2 puffs into the lungs every 6 (six) hours as needed. Shortness of breath/wheezing 12/11/15  Yes Historical Provider, MD  Pseudoephedrine-DM-GG (ROBAFEN CF PO) Take 10 mLs by mouth every 4 (four) hours as  needed (cough.).   Yes Historical Provider, MD  sodium hypochlorite (DAKIN'S 1/2 STRENGTH) external solution Use with each dressing change 11/11/15  Yes Volanda Napoleon, MD  tobramycin (TOBREX) 0.3 % ophthalmic solution Place 2 drops into the right eye every 6 (six) hours. 11/10/15  Yes Nita Sells, MD    Allergies:  Allergies  Allergen Reactions  . Hydrocodone     "makes me crawl on the floor"  . Penicillins Itching    Per pt's sister, swelling of throat Has patient had a PCN reaction causing immediate rash, facial/tongue/throat swelling, SOB or lightheadedness with hypotension: No, just itching Has patient had a PCN reaction causing severe rash involving mucus membranes or skin necrosis: No Has patient had a PCN reaction that required hospitalization No Has patient had a PCN reaction occurring within the last 10 years: Yes If all of the above answers are "NO", then may proceed with Cephalosporin use.   . Aspirin Other (  See Comments)    Due to ulcers    Past Medical History: Past Medical History  Diagnosis Date  . Arthritis   . Depression   . Fainting   . Headache   . Hypertension   . Migraines   . History of blood transfusion   . Gastric ulcer   . Breast cancer metastasized to multiple sites [facility]) 04/12/2014    10/30/15 radiation, chemotherapy  . Seizures [facility])     Past Surgical History  Procedure Laterality Date  . Abdominal hysterectomy  15 years go    partial    Social History: Lives with her husband. Pretty much bedbound at this time. No further information available.  Family History:  Family History  Problem Relation Age of Onset  . Arthritis Other   . Hypertension Mother   . Hypertension Other   . Heart disease Other   . Stroke Father   . Cancer Sister     leukemia     Review of Systems - unable to obtain due to her altered mental status  Physical Examination  Filed Vitals:   12/31/15 1307 12/31/15 1317 12/31/15 1625  BP:  151/96 121/99    Pulse:  115 113  Temp:  98.6 F (37 C)   TempSrc:  Oral   Resp:   24  SpO2: 96% 93% 93%    BP 121/99 mmHg  Pulse 113  Temp(Src) 98.6 F (37 C) (Oral)  Resp 24  SpO2 93%  General appearance: delirious and no distress Head: Normocephalic, without obvious abnormality, atraumatic Eyes: conjunctivae/corneas clear. PERRL, EOM's intact.  Neck: no adenopathy, no carotid bruit, no JVD, supple, symmetrical, trachea midline and thyroid not enlarged, symmetric, no tenderness/mass/nodules Resp: Diminished air entry bilaterally with scattered crackles. Chest wall: Dressing over her right breast area where there is an open wound Cardio: S1, S2 is tachycardic. Regular. No S3, S4. No rubs, murmurs bruit. GI: Tender diffusely without any rebound, rigidity or guarding. Extremities: Pedal edema is present Pulses: 2+ and symmetric Skin: Poor skin turgor Neurologic: Delirious when woken up. No obvious neurological deficits.  Laboratory Data: Results for orders placed or performed during the hospital encounter of 12/31/15 (from the past 48 hour(s))  CBC with Differential/Platelet     Status: Abnormal   Collection Time: 12/31/15  3:23 PM  Result Value Ref Range   WBC 7.5 4.0 - 10.5 K/uL   RBC 3.84 (L) 3.87 - 5.11 MIL/uL   Hemoglobin 12.7 12.0 - 15.0 g/dL   HCT 39.9 36.0 - 46.0 %   MCV 103.9 (H) 78.0 - 100.0 fL   MCH 33.1 26.0 - 34.0 pg   MCHC 31.8 30.0 - 36.0 g/dL   RDW 19.5 (H) 11.5 - 15.5 %   Platelets 261 150 - 400 K/uL   Neutrophils Relative % 73 %   Neutro Abs 5.5 1.7 - 7.7 K/uL   Lymphocytes Relative 15 %   Lymphs Abs 1.1 0.7 - 4.0 K/uL   Monocytes Relative 10 %   Monocytes Absolute 0.8 0.1 - 1.0 K/uL   Eosinophils Relative 2 %   Eosinophils Absolute 0.1 0.0 - 0.7 K/uL   Basophils Relative 0 %   Basophils Absolute 0.0 0.0 - 0.1 K/uL  Comprehensive metabolic panel     Status: Abnormal   Collection Time: 12/31/15  3:23 PM  Result Value Ref Range   Sodium 141 135 - 145 mmol/L    Potassium 3.8 3.5 - 5.1 mmol/L   Chloride 107 101 - 111 mmol/L  CO2 24 22 - 32 mmol/L   Glucose, Bld 80 65 - 99 mg/dL   BUN 13 6 - 20 mg/dL   Creatinine, Ser 0.44 0.44 - 1.00 mg/dL   Calcium 8.8 (L) 8.9 - 10.3 mg/dL   Total Protein 5.8 (L) 6.5 - 8.1 g/dL   Albumin 2.8 (L) 3.5 - 5.0 g/dL   AST 112 (H) 15 - 41 U/L   ALT 187 (H) 14 - 54 U/L   Alkaline Phosphatase 231 (H) 38 - 126 U/L   Total Bilirubin 1.2 0.3 - 1.2 mg/dL   GFR calc non Af Amer >60 >60 mL/min   GFR calc Af Amer >60 >60 mL/min    Comment: (NOTE) The eGFR has been calculated using the CKD EPI equation. This calculation has not been validated in all clinical situations. eGFR's persistently <60 mL/min signify possible Chronic Kidney Disease.    Anion gap 10 5 - 15  I-Stat CG4 Lactic Acid, ED     Status: None   Collection Time: 12/31/15  3:28 PM  Result Value Ref Range   Lactic Acid, Venous 1.52 0.5 - 2.0 mmol/L    Radiology Reports: Dg Chest Port 1 View  12/31/2015  CLINICAL DATA:  Altered mental status.  History of breast carcinoma EXAM: PORTABLE CHEST 1 VIEW COMPARISON:  December 20, 2015 FINDINGS: There are bilateral pleural effusions. There is left mid lung consolidation, new. There is a right perihilar nodular lesion, consistent with a metastatic focus. Heart is upper normal in size. The pulmonary vascular is within normal limits. Prominence in the azygos region and superior right hilar region is concerning for adenopathy. There is a destructive lesion involving portions of the left ninth rib. Port-A-Cath tip is in the superior vena cava. No pneumothorax. IMPRESSION: Areas of metastatic disease. Airspace consolidation left mid lung, new. Small pleural effusions present. No change in cardiac silhouette. Electronically Signed   By: Lowella Grip III M.D.   On: 12/31/2015 14:30     Problem List  Principal Problem:   Acute encephalopathy Active Problems:   Elevated liver enzymes   Protein-calorie malnutrition,  severe (HCC)   Breast cancer metastasized to multiple sites [facility])   Metastatic cancer to brain [facility])   Assessment: This is an unfortunate 60 year old African-American female with metastatic breast cancer. She comes in with poor oral intake and worsening confusion. Patient has multiple lesions in her brain. She has metastatic lesions in her lungs as well as liver. She has end-stage cancer. Previous records were reviewed. It appears that the patient's family has been having a difficult time accepting her poor prognosis. Husband is clearly unable to take care of this patient at this time. She appears to be dehydrated. There is x-ray evidence for possible pneumonia. However, considering her overall poor prognosis. Does not merit active treatment.  Plan: #1 acute encephalopathy with dehydration and failure to thrive: This is all in the setting of metastatic breast cancer. Patient's prognosis is very poor. She probably has days to weeks to survive. Patient's husband is unable to take care of her at home. She'll be given IV fluids. Better pain control. At this time I think patient will benefit from going to a residential hospice facility. Patient's husband agrees, but would like to talk to other family members. We will consult social worker to facilitate this.  #2 Metastatic breast cancer with metastases to multiple sites: She appears to have end-stage cancer. We will consult social worker to facilitate placement to residential hospice. Her life expectancy  is probably days to 2-3 weeks. Continue her dexamethasone and other medicines for now. Continue Keppra for seizure prophylaxis.  #3 history of diabetes, on insulin: Monitor blood glucose levels. Continue Lantus at a lower dose.  #4 Abnormal chest x-ray: White cell count is normal. She is afebrile. It's more likely that the abnormality seen on the chest x-ray is a metastatic process. She could've aspirated as well. In any case, considering patient's poor  prognosis and end-stage cancer, would not treat at this time. She recently completed a course of antibiotics as well.   DVT Prophylaxis: focus is comfort care Code Status: DO NOT RESUSCITATE Family Communication: Discussed with the patient's husband  Disposition Plan: Observation for fluids. Social worker to pursue residential hospice placement.   Further management decisions will depend on results of further testing and patient's response to treatment.   [facility]  [facility] Pager [phone]  If 7PM-7AM, please contact night-coverage [email or web address] Password [facility]  12/31/2015, 4:35 PM

## 2015-12-31 NOTE — Progress Notes (Signed)
   12/31/15 1600  Clinical Encounter Type  Visited With Patient and family together  Visit Type ED;Initial;Psychological support;Social support;Spiritual support  Referral From Nurse;Other (Comment) (Hospice)  Consult/Referral To Nurse  Spiritual Encounters  Spiritual Needs Emotional;Grief support  Stress Factors  Patient Stress Factors Major life changes;Health changes  Family Stress Factors Major life changes    Chaplain referred by hospice nurse on pt admission to ED.  Provided emotional and spiritual support, prayers at bedside with pt and spouse.  Pt alternated between rest and fearful / anxious state.    Pt's husband spoke of his faith as framework for understanding and making decisions about illness.  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  He is hopeful that his spouse will recover so "we can tell about the miracle."  He is at the same time hopeful for her comfort.    Will continue to follow and assess for support around progression of illness / decision making as pt is admitted.    [place], [place]

## 2015-12-31 NOTE — ED Notes (Signed)
Per PT[facility] pt from home , per family pt weakness started x 2 days, Hx breast CA with metastatic brain cancer. Pt alert and oriented yet slow response per PTAR. Pt had Lorazepam this Am . Pt was Dx and admitted  recently with pneumonia . Pt prsents with foley catheter prior to arrival. No recent chemo nor radiation. Hospice pt.

## 2015-12-31 NOTE — ED Provider Notes (Signed)
CSN: [id number]     Arrival date & time 12/31/15  1257 History   First MD Initiated Contact with Patient 12/31/15 1331     Chief Complaint  Patient presents with  . Weakness     (Consider location/radiation/quality/duration/timing/severity/associated sxs/prior Treatment) Patient is a 60 y.o. female presenting with altered mental status. The history is provided by the spouse.  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 Arthritis   . Depression   . Fainting   . Headache   . Hypertension   . Migraines   . History of blood transfusion   . Gastric ulcer   . Breast cancer metastasized to multiple sites [facility]) 04/12/2014    10/30/15 radiation, chemotherapy  . Seizures [facility])    Past Surgical History  Procedure Laterality Date  . Abdominal hysterectomy  15 years go    partial   Family History  Problem Relation Age of Onset  . Arthritis Other   . Hypertension Mother   . Hypertension Other   . Heart disease Other   . Stroke Father   . Cancer Sister     leukemia   Social History  Substance Use Topics  . Smoking status: Never Smoker   . Smokeless tobacco: Never Used     Comment: never used tobacco  . Alcohol Use: No   OB History    No data available     Review of Systems  Constitutional: Positive for decreased appetite.  Psychiatric/Behavioral: Positive for confusion.  All other systems reviewed and are negative.     Allergies  Hydrocodone; Penicillins; and Aspirin  Home Medications   Prior to Admission medications   Medication Sig Start Date End Date Taking? Authorizing Provider  ACCU-CHEK AVIVA PLUS test strip USE TO  CHECK BLOOD GLUCOSE 4 TIMES DAILY AFTER MEALS AND AT BEDTIME 10/10/15  Yes Historical Provider, MD  ACCU-CHEK SOFTCLIX LANCETS lancets USE TO CHECK BLOOD GLUCOSE 4 TIMES DAILY 10/10/15  Yes Historical Provider, MD  Blood Glucose Monitoring Suppl (ACCU-CHEK AVIVA PLUS) W/DEVICE KIT USE TO CHECK BLOOD GLUCOSE 4 TIMES DAILY 10/10/15  Yes Historical Provider, MD  [employer] SENNA 8.6 MG tablet Take 1 tablet by mouth daily.  12/08/15  Yes Historical Provider, MD  dexamethasone (DECADRON) 2 MG tablet Take 1 tablet (2 mg total) by mouth every 12 (twelve) hours. 11/10/15  Yes Nita Sells, MD  fluoxymesterone (HALOTESTIN) 10 MG tablet Take 1 tablet (10 mg total) by mouth 2 (two) times daily. Patient taking differently: Take 10 mg by mouth daily.  11/15/15  Yes Volanda Napoleon, MD  gabapentin (NEURONTIN) 300 MG capsule Take 1 capsule (300 mg total) by mouth 3 (three) times daily. Patient taking differently: Take 300 mg by mouth daily.  08/28/15  Yes Volanda Napoleon, MD  glucose monitoring kit (FREESTYLE) monitoring kit 1 each by Does not apply route 4 (four) times daily - after meals and at bedtime. 1 month Diabetic Testing Supplies for QAC-QHS accuchecks. 10/09/15  Yes Shanker Kristeen Mans, MD  insulin aspart (NOVOLOG FLEXPEN) 100 UNIT/ML FlexPen 0-9 Units, Subcutaneous, 3 times daily with meals CBG < 70: call MD CBG 70 - 120: 0 units CBG 121 - 150: 1 unit CBG  151 - 200: 2 units CBG 201 - 250: 3 units CBG 251 - 300: 5 units CBG 301 - 350: 7 units CBG 351 - 400: 9 units CBG > 400: call MD  Okay to substitute with different band at equivalent dosing 10/09/15  Yes Shanker Kristeen Mans, MD  insulin glargine (LANTUS) 100 UNIT/ML injection Inject 0.3 mLs (30 Units total) into the skin daily at 10 pm. 11/10/15  Yes Nita Sells, MD  levETIRAcetam (KEPPRA) 500 MG tablet Take 1 tablet (500 mg total) by mouth 2 (two) times daily. 11/10/15  Yes Nita Sells, MD  mometasone (ELOCON) 0.1 % cream Apply to  lips 2x a day Patient taking differently: Apply 1 application topically daily as needed (irritation). Apply to lips 2x a day 11/15/15  Yes Volanda Napoleon, MD  nystatin (MYCOSTATIN) 100000 UNIT/ML suspension Take 5 mLs (500,000 Units total) by mouth 4 (four) times daily as needed (mouth pain). 11/10/15  Yes Nita Sells, MD  ondansetron (ZOFRAN) 8 MG tablet Take 1 tablet (8 mg total) by mouth 2 (two) times daily. Start the day after chemo for 3 days. Then take as needed for nausea or vomiting. 10/09/15  Yes Shanker Kristeen Mans, MD  oxyCODONE (OXY IR/ROXICODONE) 5 MG immediate release tablet Take 1-3 tablets (5-15 mg total) by mouth every 6 (six) hours as needed for severe pain. 12/23/15  Yes Verlee Monte, MD  polyethylene glycol (MIRALAX) packet Take 17 g by mouth daily. 12/23/15  Yes Verlee Monte, MD  potassium chloride SA (K-DUR,KLOR-CON) 20 MEQ tablet Take 1 tablet (20 mEq total) by mouth daily. 11/10/15  Yes Nita Sells, MD  PROAIR HFA 108 (90 BASE) MCG/ACT inhaler Inhale 2 puffs into the lungs every 6 (six) hours as needed. Shortness of breath/wheezing 12/11/15  Yes Historical Provider, MD  sodium hypochlorite (DAKIN'S 1/2 STRENGTH) external solution Use with each dressing change 11/11/15  Yes Volanda Napoleon, MD  tobramycin (TOBREX) 0.3 % ophthalmic solution Place 2 drops into the right eye every 6 (six) hours. 11/10/15  Yes Nita Sells, MD  feeding supplement, ENSURE ENLIVE, (ENSURE ENLIVE) LIQD Take 237 mLs by mouth 2 (two) times daily between meals. Patient not taking: Reported on 12/20/2015 11/10/15   Nita Sells, MD  fluconazole (DIFLUCAN) 100 MG tablet Take 1 tablet (100 mg total) by mouth daily. Patient not taking: Reported on 12/31/2015 12/23/15   Verlee Monte, MD  Insulin Pen Needle 32G X 8 MM MISC Please provide 2 month supply. Okay to substitute with different brand 10/09/15   Jonetta Osgood, MD  levofloxacin (LEVAQUIN) 750 MG tablet Take 1 tablet (750 mg  total) by mouth daily. Patient not taking: Reported on 12/31/2015 12/23/15   Verlee Monte, MD  pantoprazole (PROTONIX) 40 MG tablet Take 1 tablet (40 mg total) by mouth 2 (two) times daily. Patient not taking: Reported on 12/20/2015 10/09/15   Jonetta Osgood, MD   BP 151/96 mmHg  Pulse 115  Temp(Src) 98.6 F (37 C) (Oral)  SpO2 93% Physical Exam  Constitutional: She appears lethargic. She appears cachectic. She appears toxic. She appears distressed.  HENT:  Head: Normocephalic and atraumatic.  Eyes: Conjunctivae are normal.  Neck: Neck supple. No tracheal deviation present.  Cardiovascular: Regular rhythm.  Tachycardia present.   Pulmonary/Chest: Effort normal. No respiratory distress. She has rhonchi. She has rales.  Abdominal: Soft. She exhibits no distension.  Neurological: She appears lethargic. She is disoriented.  Skin: Skin is warm and dry.  Psychiatric: Her affect is labile. Her  speech is slurred. She is agitated and slowed.    ED Course  Procedures (including critical care time) Labs Review Labs Reviewed  CBC WITH DIFFERENTIAL/PLATELET - Abnormal; Notable for the following:    RBC 3.84 (*)    MCV 103.9 (*)    RDW 19.5 (*)    All other components within normal limits  CULTURE, BLOOD (ROUTINE X 2)  CULTURE, BLOOD (ROUTINE X 2)  URINE CULTURE  COMPREHENSIVE METABOLIC PANEL  URINALYSIS, ROUTINE W REFLEX MICROSCOPIC (NOT AT [facility])  I-STAT CG4 LACTIC ACID, ED    Imaging Review Dg Chest Port 1 View  12/31/2015  CLINICAL DATA:  Altered mental status.  History of breast carcinoma EXAM: PORTABLE CHEST 1 VIEW COMPARISON:  December 20, 2015 FINDINGS: There are bilateral pleural effusions. There is left mid lung consolidation, new. There is a right perihilar nodular lesion, consistent with a metastatic focus. Heart is upper normal in size. The pulmonary vascular is within normal limits. Prominence in the azygos region and superior right hilar region is concerning for adenopathy.  There is a destructive lesion involving portions of the left ninth rib. Port-A-Cath tip is in the superior vena cava. No pneumothorax. IMPRESSION: Areas of metastatic disease. Airspace consolidation left mid lung, new. Small pleural effusions present. No change in cardiac silhouette. Electronically Signed   By: Lowella Grip III M.D.   On: 12/31/2015 14:30   I have personally reviewed and evaluated these images and lab results as part of my medical decision-making.   EKG Interpretation None      MDM   Final diagnoses:  Acute encephalopathy    60 y.o. female presents with worsening confusion, weakness, loss of appetite, refusing to drink and has history of widely metastatic breast cancer. I discussed options with the husband including limiting her work up to empiric IV fluids to get her home to continue comfort care measures and he opted to go forward with a medical workup. Patient is delirious, calling out for her deceased mother. Chest x-ray shows areas of metastatic disease and new left mid lung consolidation concerning for progression, pneumonia, or aspiration given her history of increasing choking with drinking and eating. Patient's husband feels uncomfortable with her at home and does not feel that he can adequately care for her. Hospitalist was consulted for admission and will see the patient in the emergency department.      Leo Grosser, MD 01/01/16 [phone]

## 2016-01-01 DIAGNOSIS — R748 Abnormal levels of other serum enzymes: Secondary | ICD-10-CM | POA: Diagnosis not present

## 2016-01-01 DIAGNOSIS — R410 Disorientation, unspecified: Secondary | ICD-10-CM | POA: Diagnosis not present

## 2016-01-01 DIAGNOSIS — C50919 Malignant neoplasm of unspecified site of unspecified female breast: Secondary | ICD-10-CM

## 2016-01-01 DIAGNOSIS — R531 Weakness: Secondary | ICD-10-CM | POA: Diagnosis not present

## 2016-01-01 DIAGNOSIS — E43 Unspecified severe protein-calorie malnutrition: Secondary | ICD-10-CM

## 2016-01-01 DIAGNOSIS — C7931 Secondary malignant neoplasm of brain: Secondary | ICD-10-CM

## 2016-01-01 DIAGNOSIS — G9340 Encephalopathy, unspecified: Secondary | ICD-10-CM | POA: Diagnosis not present

## 2016-01-01 DIAGNOSIS — R7989 Other specified abnormal findings of blood chemistry: Secondary | ICD-10-CM

## 2016-01-01 DIAGNOSIS — C7940 Secondary malignant neoplasm of unspecified part of nervous system: Secondary | ICD-10-CM

## 2016-01-01 LAB — GLUCOSE, CAPILLARY
GLUCOSE-CAPILLARY: 94 mg/dL (ref 65–99)
GLUCOSE-CAPILLARY: 122 mg/dL — AB (ref 65–99)
GLUCOSE-CAPILLARY: 181 mg/dL — AB (ref 65–99)
GLUCOSE-CAPILLARY: 157 mg/dL — AB (ref 65–99)
Glucose-Capillary: 106 mg/dL — ABNORMAL HIGH (ref 65–99)
Glucose-Capillary: 172 mg/dL — ABNORMAL HIGH (ref 65–99)

## 2016-01-01 MED ORDER — VANCOMYCIN HCL IN DEXTROSE 1-5 GM/200ML-% IV SOLN
1000.0000 mg | Freq: Two times a day (BID) | INTRAVENOUS | Status: DC
  Administered 2016-01-02: 1000 mg via INTRAVENOUS

## 2016-01-01 MED ORDER — INSULIN GLARGINE 100 UNIT/ML ~~LOC~~ SOLN
5.0000 [IU] | Freq: Every day | SUBCUTANEOUS | Status: DC
  Administered 2016-01-01: 5 [IU] via SUBCUTANEOUS
  Filled 2016-01-01 (×2): qty 0.05

## 2016-01-01 MED ORDER — DEXTROSE 5 % IV SOLN
INTRAVENOUS | Status: DC
  Administered 2016-01-01: 23:00:00 via INTRAVENOUS

## 2016-01-01 MED ORDER — VANCOMYCIN HCL IN DEXTROSE 1-5 GM/200ML-% IV SOLN
1000.0000 mg | INTRAVENOUS | Status: AC
  Administered 2016-01-01: 1000 mg via INTRAVENOUS
  Filled 2016-01-01: qty 200

## 2016-01-01 NOTE — Progress Notes (Signed)
[facility] PROGRESS NOTE  Mary [name] [id number] DOB: 08-16-1956 DOA: 12/31/2015 PCP: Sandi Mariscal, MD  HPI/Brief narrative 60 y.o. female with past medical history of metastatic breast cancer with metastatic disease to the lungs, liver, brain, along with a history of diabetes. Pt presented with confusion  Assessment/Plan: #1 acute encephalopathy with dehydration and failure to thrive: This is all in the setting of metastatic breast cancer. Patient's prognosis is very poor of days to weeks anticipated. Patient's husband is unable to take care of her at home. For now, pt is continued on IV fluids. Cont analgesia as tolerated. Patient will benefit from residential hospice facility. Appreciate Education officer, museum input.  #2 Metastatic breast cancer with metastases to multiple sites: She appears to have end-stage cancer. Consulted social worker to facilitate placement to residential hospice. Her life expectancy is probably days to 2-3 weeks (less than month per Oncology). For now, continue her dexamethasone and other medicines for now. Continue Keppra for seizure prophylaxis.  #3 history of diabetes, on insulin: Monitor blood glucose levels. Continued Lantus at a lower dose.  #4 Abnormal chest x-ray: White cell count is normal. She is afebrile. It's more likely that the abnormality seen on the chest x-ray is a metastatic process.  #5 Questionable Gram pos bacteremia: 1/2 blood cultures thus far pos for gm pos in clusters. Again, pt afebrile with no leukocytosis. Hopefully final speciation will demonstrate coag neg staph, which would be more suggestive of contaminant and NOT bacteremia. Empiric vanc for now   Code Status: DNR Family Communication: Pt in room, family at bedside Disposition Plan: Possible residential hospice in 24hrs   Consultants:  Oncology  Procedures:    Antibiotics: Anti-infectives    None      HPI/Subjective: Confused, unable to assess  Objective: Filed  Vitals:   12/31/15 1317 12/31/15 1625 12/31/15 1910 01/01/16 1306  BP: 151/96 121/99 132/93 124/97  Pulse: 115 113 104 109  Temp: 98.6 F (37 C)  97.7 F (36.5 C) 98 F (36.7 C)  TempSrc: Oral  Oral Oral  Resp:  24 22 21   Height:   5\' 5"  (1.651 m)   SpO2: 93% 93% 97% 98%    Intake/Output Summary (Last 24 hours) at 01/01/16 1821 Last data filed at 01/01/16 1311  Gross per 24 hour  Intake    360 ml  Output   1700 ml  Net  -1340 ml   There were no vitals filed for this visit.  Exam:   General:  Awake, in nad  Cardiovascular: regular, s1, s2  Respiratory: normal resp effort, no wheezing  Abdomen: soft,nondistended  Musculoskeletal: perfused, no clubbing   Data Reviewed: Basic Metabolic Panel:  Recent Labs Lab 12/31/15 1523  NA 141  K 3.8  CL 107  CO2 24  GLUCOSE 80  BUN 13  CREATININE 0.44  CALCIUM 8.8*   Liver Function Tests:  Recent Labs Lab 12/31/15 1523  AST 112*  ALT 187*  ALKPHOS 231*  BILITOT 1.2  PROT 5.8*  ALBUMIN 2.8*   No results for input(s): LIPASE, AMYLASE in the last 168 hours. No results for input(s): AMMONIA in the last 168 hours. CBC:  Recent Labs Lab 12/31/15 1523  WBC 7.5  NEUTROABS 5.5  HGB 12.7  HCT 39.9  MCV 103.9*  PLT 261   Cardiac Enzymes: No results for input(s): CKTOTAL, CKMB, CKMBINDEX, TROPONINI in the last 168 hours. BNP (last 3 results)  Recent Labs  12/20/15 2120  BNP 29.5    ProBNP (last  3 results) No results for input(s): PROBNP in the last 8760 hours.  CBG:  Recent Labs Lab 01/01/16 0157 01/01/16 0516 01/01/16 0826 01/01/16 1143 01/01/16 1627  GLUCAP 94 106* 122* 181* 157*    Recent Results (from the past 240 hour(s))  Blood culture (routine x 2)     Status: None (Preliminary result)   Collection Time: 12/31/15  3:20 PM  Result Value Ref Range Status   Specimen Description BLOOD RIGHT ANTECUBITAL  Final   Special Requests BOTTLES DRAWN AEROBIC AND ANAEROBIC 5CC EACH  Final    Culture   Final    NO GROWTH < 24 HOURS Performed at [facility]    Report Status PENDING  Incomplete  Blood culture (routine x 2)     Status: None (Preliminary result)   Collection Time: 12/31/15  3:30 PM  Result Value Ref Range Status   Specimen Description BLOOD LEFT WRIST  Final   Special Requests IN PEDIATRIC BOTTLE 2CC  Final   Culture  Setup Time   Final    GRAM POSITIVE COCCI IN CLUSTERS IN PEDIATRIC BOTTLE CRITICAL RESULT CALLED TO, READ BACK BY AND VERIFIED WITH: Barbara Cower RN 17:50 01/01/16 ([email or web address])    Culture   Final    NO GROWTH < 24 HOURS Performed at [facility]    Report Status PENDING  Incomplete     Studies: Dg Chest Port 1 View  12/31/2015  CLINICAL DATA:  Altered mental status.  History of breast carcinoma EXAM: PORTABLE CHEST 1 VIEW COMPARISON:  December 20, 2015 FINDINGS: There are bilateral pleural effusions. There is left mid lung consolidation, new. There is a right perihilar nodular lesion, consistent with a metastatic focus. Heart is upper normal in size. The pulmonary vascular is within normal limits. Prominence in the azygos region and superior right hilar region is concerning for adenopathy. There is a destructive lesion involving portions of the left ninth rib. Port-A-Cath tip is in the superior vena cava. No pneumothorax. IMPRESSION: Areas of metastatic disease. Airspace consolidation left mid lung, new. Small pleural effusions present. No change in cardiac silhouette. Electronically Signed   By: Lowella Grip III M.D.   On: 12/31/2015 14:30    Scheduled Meds: . dexamethasone  2 mg Oral Q12H  . feeding supplement (ENSURE ENLIVE)  237 mL Oral BID BM  . gabapentin  300 mg Oral TID  . insulin aspart  0-9 Units Subcutaneous TID WC  . insulin glargine  5 Units Subcutaneous QHS  . levETIRAcetam  500 mg Oral BID  . polyethylene glycol  17 g Oral Daily   Continuous Infusions:   Principal Problem:   Acute encephalopathy Active  Problems:   Elevated liver enzymes   Protein-calorie malnutrition, severe (HCC)   Breast cancer metastasized to multiple sites [facility])   Metastatic cancer to brain ([place])    [facility], [place] [facility] Pager [phone]. If 7PM-7AM, please contact night-coverage at [email or web address], password [facility] 01/01/2016, 6:21 PM

## 2016-01-01 NOTE — Progress Notes (Signed)
ANTIBIOTIC CONSULT NOTE - INITIAL  Pharmacy Consult for Vancomycin Indication: Bacteremia  Allergies  Allergen Reactions  . Hydrocodone     "makes me crawl on the floor"  . Penicillins Itching    Per pt's sister, swelling of throat Has patient had a PCN reaction causing immediate rash, facial/tongue/throat swelling, SOB or lightheadedness with hypotension: No, just itching Has patient had a PCN reaction causing severe rash involving mucus membranes or skin necrosis: No Has patient had a PCN reaction that required hospitalization No Has patient had a PCN reaction occurring within the last 10 years: Yes If all of the above answers are "NO", then may proceed with Cephalosporin use.   . Aspirin Other (See Comments)    Due to ulcers    Patient Measurements: Height: 5\' 5"  (165.1 cm) IBW/kg (Calculated) : 57 Adjusted Body Weight:   Vital Signs: Temp: 98 F (36.7 C) (01/04 1306) Temp Source: Oral (01/04 1306) BP: 124/97 mmHg (01/04 1306) Pulse Rate: 109 (01/04 1306) Intake/Output from previous day: 01/03 0701 - 01/04 0700 In: -  Out: 1200 [Urine:1200] Intake/Output from this shift: Total I/O In: 360 [P.O.:360] Out: 500 [Urine:500]  Labs:  Recent Labs  12/31/15 1523  WBC 7.5  HGB 12.7  PLT 261  CREATININE 0.44   Estimated Creatinine Clearance: 77.7 mL/min (by C-G formula based on Cr of 0.44). No results for input(s): VANCOTROUGH, VANCOPEAK, VANCORANDOM, GENTTROUGH, GENTPEAK, GENTRANDOM, TOBRATROUGH, TOBRAPEAK, TOBRARND, AMIKACINPEAK, AMIKACINTROU, AMIKACIN in the last 72 hours.   Microbiology: Recent Results (from the past 720 hour(s))  Culture, blood (routine x 2)     Status: None   Collection Time: 12/20/15 10:18 PM  Result Value Ref Range Status   Specimen Description BLOOD BLOOD RIGHT HAND  Final   Special Requests BOTTLES DRAWN AEROBIC AND ANAEROBIC 5CC  Final   Culture   Final    NO GROWTH 5 DAYS Performed at [facility]    Report Status  12/25/2015 FINAL  Final  Culture, blood (routine x 2)     Status: None   Collection Time: 12/20/15 10:23 PM  Result Value Ref Range Status   Specimen Description BLOOD BLOOD LEFT HAND  Final   Special Requests BOTTLES DRAWN AEROBIC AND ANAEROBIC 5ML  Final   Culture   Final    NO GROWTH 5 DAYS Performed at [facility]    Report Status 12/25/2015 FINAL  Final  Culture, Urine     Status: None   Collection Time: 12/21/15 12:30 PM  Result Value Ref Range Status   Specimen Description URINE, CLEAN CATCH  Final   Special Requests NONE  Final   Culture   Final    >=100,000 COLONIES/mL YEAST Performed at [facility]    Report Status 12/23/2015 FINAL  Final  Blood culture (routine x 2)     Status: None (Preliminary result)   Collection Time: 12/31/15  3:20 PM  Result Value Ref Range Status   Specimen Description BLOOD RIGHT ANTECUBITAL  Final   Special Requests BOTTLES DRAWN AEROBIC AND ANAEROBIC 5CC EACH  Final   Culture   Final    NO GROWTH < 24 HOURS Performed at [facility]    Report Status PENDING  Incomplete  Blood culture (routine x 2)     Status: None (Preliminary result)   Collection Time: 12/31/15  3:30 PM  Result Value Ref Range Status   Specimen Description BLOOD LEFT WRIST  Final   Special Requests IN PEDIATRIC BOTTLE [facility]  Final   Culture  Setup Time   Final    GRAM POSITIVE COCCI IN CLUSTERS IN PEDIATRIC BOTTLE CRITICAL RESULT CALLED TO, READ BACK BY AND VERIFIED WITH: Barbara Cower RN 17:50 01/01/16 ([email or web address])    Culture   Final    NO GROWTH < 24 HOURS Performed at [facility]    Report Status PENDING  Incomplete    Medical History: Past Medical History  Diagnosis Date  . Arthritis   . Depression   . Fainting   . Headache   . Hypertension   . Migraines   . History of blood transfusion   . Gastric ulcer   . Breast cancer metastasized to multiple sites [facility]) 04/12/2014    10/30/15 radiation, chemotherapy  . Seizures ([place])      Assessment: 57 yoF with PMHx metastatic breast cancer, seizures, HTN, depression, and HA presented to [facility] on 1/3 with weakness, confusion, and loss of appetite.  Blood cultures growing 1 of 2 GPC in clusters and pharmacy consulted to start empiric vancomycin for bacteremia.    Anti-infectives 1/4 >> Vancomycin  >>  Vitals/Labs WBC: WNL Tm24h: Afebrile Renal: SCr 0.44, CrCl ~78 ml/min (N 85)  Cultures 1/3 bloodx2: 1 of 2 GPC in clusters  Goal of Therapy:  Vancomycin trough level 15-20 mcg/ml  Eradication of infection  Plan:  Vancomycin 1g IV q12h F/u culture results, renal fxn, Vanc levels at Css as warranted  Ralene Bathe, PharmD, BCPS 01/01/2016, 6:58 PM  Pager: [phone]

## 2016-01-01 NOTE — Progress Notes (Signed)
CRITICAL VALUE ALERT  Critical value received:  Gram positive cocci in clusters - pediatric bottle  Date of notification:  01/01/2016  Time of notification:  1748  Critical value read back:Yes.    Nurse who received alert:  Aldean Baker RN  MD notified (1st page):  Wyline Copas  Time of first page:  1748  MD notified (2nd page):  Time of second page:  Responding MD:  Wyline Copas  Time MD responded:  [id number] -- no new orders

## 2016-01-01 NOTE — Progress Notes (Signed)
RM  [facility] MSW Note:  Hospice SW and RN Lavella Lemons met with pt's spouse to discuss pt's status and disposition. Discussed BP vs home vs SNF. A BP bed offer has been made. Discussed the same with pt's sister [name] via phone call. Spouse desires to discuss options with pt's sister [name] before making a final decision. SW will follow up tomorrow morning. CSW [place] aware of the aforementioned.Lesle Reek, LCSW

## 2016-01-01 NOTE — NC FL2 (Signed)
[place] LEVEL OF CARE SCREENING TOOL     IDENTIFICATION  Patient Name: Mary [name] Birthdate: 02-Oct-1956 Sex: female Admission Date (Current Location): 12/31/2015  [facility] and [place] Number:  Herbalist and Address:  [facility],  [place], [place]      Provider Number: [id number]  Attending Physician Name and Address:  Donne Hazel, MD  Relative Name and Phone Number:       Current Level of Care: Hospital Recommended Level of Care: [place] Prior Approval Number:    Date Approved/Denied: 01/01/16 PASRR Number: [id number]  Discharge Plan: SNF    Current Diagnoses: Patient Active Problem List   Diagnosis Date Noted  . Acute encephalopathy 12/31/2015  . Constipation 12/21/2015  . UTI (urinary tract infection) 12/21/2015  . AP (abdominal pain)   . Palliative care encounter 11/07/2015  . DNR (do not resuscitate) discussion 11/07/2015  . Altered mental status   . Anemia, unspecified 11/06/2015  . Thrombocytopenia ([place]) 11/06/2015  . HCAP (healthcare-associated pneumonia) 11/05/2015  . 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 Metastatic cancer to brain ([place])   . Hyperglycemia 10/06/2015  . Seizure ([place]) 10/06/2015  . Status epilepticus ([place]) 10/06/2015  . Wound infection ([place]) 10/06/2015  . over 75 brain metastases, several showing hemorrhage 03/04/2015  . Wound of right breast 08/06/2014  . Breast cancer metastasized to multiple sites ([place]) 04/12/2014  . Protein-calorie malnutrition, severe ([place]) 04/06/2014  . Fever, unspecified 04/05/2014  . Right hip pain 04/05/2014  . Abdominal tenderness, generalized 04/05/2014  . Back pain 04/05/2014  . Cellulitis 04/05/2014  . Migraine headache 04/05/2014  . Elevated liver enzymes 04/05/2014  . Sepsis ([place]) 04/05/2014  . Preventative health care 07/04/2013  . Anxiety state, unspecified 07/04/2013  . Arthritis   . Depression   .  Hypertension   . Migraines     Orientation RESPIRATION BLADDER Height & Weight    Self, Time  Normal Incontinent, Indwelling catheter 5\' 5"  (165.1 cm)    BEHAVIORAL SYMPTOMS/MOOD NEUROLOGICAL BOWEL NUTRITION STATUS   (no behaviors) Convulsions/Seizures (hx of seizures) Incontinent Diet (DIET - DYS 1 )  AMBULATORY STATUS COMMUNICATION OF NEEDS Skin   Total Care Verbally Other (Comment) (Incision (closed) right breast, gauze dressing)                       Personal Care Assistance Level of Assistance  Bathing, Feeding, Dressing Bathing Assistance: Maximum assistance Feeding assistance: Limited assistance Dressing Assistance: Maximum assistance     Functional Limitations Info  Sight, Hearing, Speech Sight Info: Adequate Hearing Info: Adequate Speech Info: Adequate    SPECIAL CARE FACTORS FREQUENCY                       Contractures Contractures Info: Not present    Additional Factors Info  Code Status, Allergies, Insulin Sliding Scale Code Status Info: DNR code status Allergies Info: Hydrocodone, Penicillins, Aspirin   Insulin Sliding Scale Info: 3 x a day       Current Medications (01/01/2016):  This is the current hospital active medication list Current Facility-Administered Medications  Medication Dose Route Frequency Provider Last Rate Last Dose  . acetaminophen (TYLENOL) tablet 650 mg  650 mg Oral Q6H PRN Bonnielee Haff, MD       Or  . acetaminophen (TYLENOL) suppository 650 mg  650 mg Rectal Q6H PRN Bonnielee Haff, MD      . albuterol (PROVENTIL) (  2.5 MG/3ML) 0.083% nebulizer solution 2.5 mg  2.5 mg Nebulization Q2H PRN Bonnielee Haff, MD      . bisacodyl (DULCOLAX) suppository 10 mg  10 mg Rectal PRN Bonnielee Haff, MD      . dexamethasone (DECADRON) tablet 2 mg  2 mg Oral Q12H Bonnielee Haff, MD   2 mg at 01/01/16 0943  . feeding supplement (ENSURE ENLIVE) (ENSURE ENLIVE) liquid 237 mL  237 mL Oral BID BM Bonnielee Haff, MD   237 mL at 01/01/16 0942   . gabapentin (NEURONTIN) capsule 300 mg  300 mg Oral TID Bonnielee Haff, MD   300 mg at 01/01/16 1706  . haloperidol (HALDOL) tablet 0.5 mg  0.5 mg Oral Q4H PRN Bonnielee Haff, MD       Or  . haloperidol (HALDOL) 2 MG/ML solution 0.5 mg  0.5 mg Sublingual Q4H PRN Bonnielee Haff, MD       Or  . haloperidol lactate (HALDOL) injection 0.5 mg  0.5 mg Intravenous Q4H PRN Bonnielee Haff, MD      . insulin aspart (novoLOG) injection 0-9 Units  0-9 Units Subcutaneous TID WC Bonnielee Haff, MD   2 Units at 01/01/16 1706  . levETIRAcetam (KEPPRA) tablet 500 mg  500 mg Oral BID Bonnielee Haff, MD   500 mg at 01/01/16 0943  . LORazepam (ATIVAN) tablet 1 mg  1 mg Oral Q4H PRN Bonnielee Haff, MD       Or  . LORazepam (ATIVAN) 2 MG/ML concentrated solution 1 mg  1 mg Sublingual Q4H PRN Bonnielee Haff, MD       Or  . LORazepam (ATIVAN) injection 1 mg  1 mg Intravenous Q4H PRN Bonnielee Haff, MD      . ondansetron (ZOFRAN-ODT) disintegrating tablet 4 mg  4 mg Oral Q6H PRN Bonnielee Haff, MD       Or  . ondansetron (ZOFRAN) injection 4 mg  4 mg Intravenous Q6H PRN Bonnielee Haff, MD      . oxyCODONE (ROXICODONE) 5 MG/5ML solution 5 mg  5 mg Oral Q2H PRN Bonnielee Haff, MD   5 mg at 01/01/16 1707   Or  . oxyCODONE (ROXICODONE INTENSOL) 20 MG/ML concentrated solution 5 mg  5 mg Sublingual Q2H PRN Bonnielee Haff, MD      . polyethylene glycol (MIRALAX / GLYCOLAX) packet 17 g  17 g Oral Daily Bonnielee Haff, MD   17 g at 01/01/16 0943  . polyvinyl alcohol (LIQUIFILM TEARS) 1.4 % ophthalmic solution 1 drop  1 drop Both Eyes QID PRN Bonnielee Haff, MD      . senna (SENOKOT) tablet 8.6 mg  1 tablet Oral QHS PRN Bonnielee Haff, MD         Discharge Medications: Please see discharge summary for a list of discharge medications.  Relevant Imaging Results:  Relevant Lab Results:   Additional Information SSN: [id number]  KIDD, Hughes Better A, LCSW

## 2016-01-01 NOTE — Progress Notes (Signed)
Pt admitted from home with [facility] and CSW received notification for possible residential hospice placement.  CSW spoke with [facility] SW who met with pt's spouse at length to discuss pt's status and disposition. [facility] SW has discussed BP vs home vs SNF. [place] bed has been offered to pt spouse. [facility] SW also discuss options with pt's sister and pt spouse and pt sister plan to discuss options before making a final decision. [facility] SW plans to notify this CSW of pt family decision. CSW available to assist with SNF placement if desired.   CSW to continue to follow and complete full psychosocial assessment when appropriate.  Alison Murray, MSW, [facility] [phone]

## 2016-01-01 NOTE — Progress Notes (Signed)
[place] [facility] [place] RN Visit  This is a related admission to [facility] Dx of Breast CA.  Patient was a Full Code, prior to this admission.  She is now a DNR. Spouse activated EMS, after patient became confused and refused to eat or drink.  [facility] was notified after the patient was in route to the ED. Husband reports increased difficulty in caring for her at home.  [facility] has continued to provide teaching and support as she had a progressive decline in overall status, since her [place] admission on 12/04/15.  Patient seen in room, resting with eyes closed.  Patient intermittently saying the word "mama."  She is unable to engage in any meaningful conversation.  No family present at time of visit.  Patient currently receiving IVF via her accessed chest port.  Per chart review, she has received 1 dose of 5mg  Roxanol po for pain this admission.  [facility] social worker, Benedict Needy is currently working with hospice team to see if patient meets [facility] criteria for admission and to acquire about bed availability.  This would also be contingent upon family wishes for EOL care.  [facility] will continue to follow and anticipate any discharge needs.  Updated [facility] medication list and transfer summary placed on chart.  Please call with any questions.  Thank You, Freddi Starr RN, [facility] Liaison [phone]

## 2016-01-01 NOTE — Consult Note (Signed)
Referral MD  Reason for Referral: Metastatic breast cancer-progressive   Chief Complaint  Patient presents with  . Weakness  : Patient cannot give any history.  HPI: Mary [name] is well-known to me. She is a very nice 60 year old African-American female. She has progressive metastatic breast cancer. She has extensive CNS metastases. She has hepatic metastases. She is on hospice. She is being treated with Halotestin as her tumor still has some ER-positive nature.  She was admitted because of continued decline. She has confusion. She is not eating. She is having no obvious pain. She's having no seizures. There is no bowel or bladder incontinence.  Her husband is with her. He still has some hope that she will improve. However, hospice is doing great job with he and their family letting them know that her decline will continue.  When she was admitted, her liver function tests were elevated. Her calcium was 8.8 with an albumin of 2.8. She was not anemic. I suspect that she probably was somewhat dehydrated. Her hemoglobin was 12.7.   A chest x-ray was done which showed pulmonary metastases. She may have had an infiltrate in the left lung.  Her husband says that she's been eating. Again it is hard to know how much she really is actually eating.  He also says that she may go to [facility]. I think this would be ideal for her.     Past Medical History  Diagnosis Date  . Arthritis   . Depression   . Fainting   . Headache   . Hypertension   . Migraines   . History of blood transfusion   . Gastric ulcer   . Breast cancer metastasized to multiple sites [facility]) 04/12/2014    10/30/15 radiation, chemotherapy  . Seizures ([place])   :  Past Surgical History  Procedure Laterality Date  . Abdominal hysterectomy  15 years go    partial  :   Current facility-administered medications:  .  acetaminophen (TYLENOL) tablet 650 mg, 650 mg, Oral, Q6H PRN **OR** acetaminophen (TYLENOL) suppository  650 mg, 650 mg, Rectal, Q6H PRN, Bonnielee Haff, MD .  albuterol (PROVENTIL) (2.5 MG/3ML) 0.083% nebulizer solution 2.5 mg, 2.5 mg, Nebulization, Q2H PRN, Bonnielee Haff, MD .  bisacodyl (DULCOLAX) suppository 10 mg, 10 mg, Rectal, PRN, Bonnielee Haff, MD .  dexamethasone (DECADRON) tablet 2 mg, 2 mg, Oral, Q12H, Bonnielee Haff, MD, 2 mg at 12/31/15 2243 .  dextrose 5 % solution, , Intravenous, Continuous, Gardiner Barefoot, NP, Last Rate: 50 mL/hr at 12/31/15 2343 .  feeding supplement (ENSURE ENLIVE) (ENSURE ENLIVE) liquid 237 mL, 237 mL, Oral, BID BM, Bonnielee Haff, MD .  fluoxymesterone (HALOTESTIN) tablet 10 mg, 10 mg, Oral, BID, Bonnielee Haff, MD, 10 mg at 12/31/15 2200 .  gabapentin (NEURONTIN) capsule 300 mg, 300 mg, Oral, TID, Bonnielee Haff, MD, 300 mg at 12/31/15 2243 .  haloperidol (HALDOL) tablet 0.5 mg, 0.5 mg, Oral, Q4H PRN **OR** haloperidol (HALDOL) 2 MG/ML solution 0.5 mg, 0.5 mg, Sublingual, Q4H PRN **OR** haloperidol lactate (HALDOL) injection 0.5 mg, 0.5 mg, Intravenous, Q4H PRN, Bonnielee Haff, MD .  insulin aspart (novoLOG) injection 0-9 Units, 0-9 Units, Subcutaneous, TID WC, Bonnielee Haff, MD .  levETIRAcetam (KEPPRA) tablet 500 mg, 500 mg, Oral, BID, Bonnielee Haff, MD, 500 mg at 12/31/15 2243 .  LORazepam (ATIVAN) tablet 1 mg, 1 mg, Oral, Q4H PRN **OR** LORazepam (ATIVAN) 2 MG/ML concentrated solution 1 mg, 1 mg, Sublingual, Q4H PRN **OR** LORazepam (ATIVAN) injection 1 mg, 1  mg, Intravenous, Q4H PRN, Bonnielee Haff, MD .  ondansetron (ZOFRAN-ODT) disintegrating tablet 4 mg, 4 mg, Oral, Q6H PRN **OR** ondansetron (ZOFRAN) injection 4 mg, 4 mg, Intravenous, Q6H PRN, Bonnielee Haff, MD .  oxyCODONE (ROXICODONE) 5 MG/5ML solution 5 mg, 5 mg, Oral, Q2H PRN, 5 mg at 01/01/16 0514 **OR** oxyCODONE (ROXICODONE INTENSOL) 20 MG/ML concentrated solution 5 mg, 5 mg, Sublingual, Q2H PRN, Bonnielee Haff, MD .  polyethylene glycol (MIRALAX / GLYCOLAX) packet 17 g, 17 g, Oral, Daily,  Bonnielee Haff, MD, 17 g at 12/31/15 2000 .  polyvinyl alcohol (LIQUIFILM TEARS) 1.4 % ophthalmic solution 1 drop, 1 drop, Both Eyes, QID PRN, Bonnielee Haff, MD .  senna (SENOKOT) tablet 8.6 mg, 1 tablet, Oral, QHS PRN, Bonnielee Haff, MD:  . dexamethasone  2 mg Oral Q12H  . feeding supplement (ENSURE ENLIVE)  237 mL Oral BID BM  . fluoxymesterone  10 mg Oral BID  . gabapentin  300 mg Oral TID  . insulin aspart  0-9 Units Subcutaneous TID WC  . levETIRAcetam  500 mg Oral BID  . polyethylene glycol  17 g Oral Daily  :  Allergies  Allergen Reactions  . Hydrocodone     "makes me crawl on the floor"  . Penicillins Itching    Per pt's sister, swelling of throat Has patient had a PCN reaction causing immediate rash, facial/tongue/throat swelling, SOB or lightheadedness with hypotension: No, just itching Has patient had a PCN reaction causing severe rash involving mucus membranes or skin necrosis: No Has patient had a PCN reaction that required hospitalization No Has patient had a PCN reaction occurring within the last 10 years: Yes If all of the above answers are "NO", then may proceed with Cephalosporin use.   . Aspirin Other (See Comments)    Due to ulcers  :  Family History  Problem Relation Age of Onset  . Arthritis Other   . Hypertension Mother   . Hypertension Other   . Heart disease Other   . Stroke Father   . Cancer Sister     leukemia  :  Social History   Social History  . Marital Status: Married    Spouse Name: N/A  . Number of Children: 0  . Years of Education: N/A   Occupational History  .      no work for 4 to 5 years   Social History Main Topics  . Smoking status: Never Smoker   . Smokeless tobacco: Never Used     Comment: never used tobacco  . Alcohol Use: No  . Drug Use: No  . Sexual Activity: Not Currently   Other Topics Concern  . Not on file   Social History Narrative  :  Pertinent items are noted in HPI.  Exam: Patient Vitals for the  past 24 hrs:  BP Temp Temp src Pulse Resp SpO2 Height  12/31/15 1910 (!) 132/93 mmHg 97.7 F (36.5 C) Oral (!) 104 (!) 22 97 % 5\' 5"  (1.651 m)  12/31/15 1625 121/99 mmHg - - 113 24 93 % -  12/31/15 1317 151/96 mmHg 98.6 F (37 C) Oral 115 - 93 % -  12/31/15 1307 - - - - - 96 % -    as above    Recent Labs  12/31/15 1523  WBC 7.5  HGB 12.7  HCT 39.9  PLT 261    Recent Labs  12/31/15 1523  NA 141  K 3.8  CL 107  CO2 24  GLUCOSE 80  BUN 13  CREATININE 0.44  CALCIUM 8.8*    Blood smear review:  None  Pathology: None     Assessment and Plan:  Mary [name] is a 60 year old African-American female with metastatic breast cancer. She has extensive cranial metastasis. I'm sure this is the problem. I'm sure that these are progressing. Her liver tests are elevated so she probably has progressive hepatic metastasis.  The main goal for my point of view is comfort. Again she is not having seizures. She is on Decadron. This is low-dose.  I really think that trying to in her to be in place would be optimal.  I suspect that the Halotestin probably is not helping. As such, I probably would stop this. Her liver tests are elevated so this would make Halotestin potentially more toxic.  Again, her husband has a very strong faith. He still is hoping that God will get her better. I have talked to him about this at length.  I will like to leave that she will not make it through this month. She definitely has no quality of life.  I appreciate all the great care that she is getting from everybody up on [place]  2 Timothy 4:18

## 2016-01-02 DIAGNOSIS — C7931 Secondary malignant neoplasm of brain: Secondary | ICD-10-CM | POA: Diagnosis not present

## 2016-01-02 DIAGNOSIS — E43 Unspecified severe protein-calorie malnutrition: Secondary | ICD-10-CM | POA: Diagnosis not present

## 2016-01-02 DIAGNOSIS — R748 Abnormal levels of other serum enzymes: Secondary | ICD-10-CM | POA: Diagnosis not present

## 2016-01-02 DIAGNOSIS — G9340 Encephalopathy, unspecified: Secondary | ICD-10-CM | POA: Diagnosis not present

## 2016-01-02 LAB — GLUCOSE, CAPILLARY
GLUCOSE-CAPILLARY: 160 mg/dL — AB (ref 65–99)
Glucose-Capillary: 117 mg/dL — ABNORMAL HIGH (ref 65–99)

## 2016-01-02 MED ORDER — OXYCODONE HCL 5 MG/5ML PO SOLN: 5.0000 mg | ORAL | Status: AC | PRN

## 2016-01-02 MED ORDER — HALOPERIDOL LACTATE 2 MG/ML PO CONC: 0.5000 mg | ORAL | Status: AC | PRN

## 2016-01-02 MED ORDER — SENNA 8.6 MG PO TABS: 1.0000 | ORAL_TABLET | Freq: Every evening | ORAL | Status: AC | PRN

## 2016-01-02 MED ORDER — LIP MEDEX EX OINT
TOPICAL_OINTMENT | CUTANEOUS | Status: AC
  Administered 2016-01-02: 14:00:00
  Filled 2016-01-02: qty 7

## 2016-01-02 MED ORDER — HALOPERIDOL 0.5 MG PO TABS: 0.5000 mg | ORAL_TABLET | ORAL | Status: DC | PRN

## 2016-01-02 MED ORDER — LORAZEPAM 2 MG/ML PO CONC: 1.0000 mg | ORAL | Status: AC | PRN

## 2016-01-02 MED ORDER — POLYVINYL ALCOHOL 1.4 % OP SOLN: 1.0000 [drp] | Freq: Four times a day (QID) | OPHTHALMIC | Status: AC | PRN

## 2016-01-02 MED ORDER — ONDANSETRON 4 MG PO TBDP: 4.0000 mg | ORAL_TABLET | Freq: Four times a day (QID) | ORAL | Status: AC | PRN

## 2016-01-02 MED ORDER — LORAZEPAM 2 MG/ML IJ SOLN: 1.0000 mg | INTRAMUSCULAR | Status: DC | PRN

## 2016-01-02 MED ORDER — ONDANSETRON HCL 4 MG/2ML IJ SOLN: 4.0000 mg | Freq: Four times a day (QID) | INTRAMUSCULAR | Status: AC | PRN

## 2016-01-02 MED ORDER — ALBUTEROL SULFATE (2.5 MG/3ML) 0.083% IN NEBU: 2.5000 mg | INHALATION_SOLUTION | RESPIRATORY_TRACT | Status: AC | PRN

## 2016-01-02 MED ORDER — OXYCODONE HCL 20 MG/ML PO CONC: 5.0000 mg | ORAL | Status: AC | PRN

## 2016-01-02 MED ORDER — HALOPERIDOL LACTATE 5 MG/ML IJ SOLN: 0.5000 mg | INTRAMUSCULAR | Status: DC | PRN

## 2016-01-02 MED ORDER — LORAZEPAM 1 MG PO TABS: 1.0000 mg | ORAL_TABLET | ORAL | Status: AC | PRN

## 2016-01-02 NOTE — Progress Notes (Signed)
[name] 1345-[facility] [place] RN Visit  This is a related admission to [facility] Dx of Breast CA. Patient was a Full Code, prior to this admission. She is now a DNR. Spouse activated EMS, after patient became confused and refused to eat or drink. [facility] was notified after the patient was in route to the ED. Husband reports increased difficulty in caring for her at home. [facility] has continued to provide teaching and support as she had a progressive decline in overall status, since her [place] admission on 12/04/15.   Patient seen in room, resting with eyes closed. Patient lethargic, but responds to voice.Patient denies any pain and appears comfortable.  She is on RA with her O2 sats and her respirations WNL. Husband at bedside. Patient currently receiving IVF via her accessed chest port. Per chart review, she has received 3 doses of 5mg  Roxanol po for pain the past 24 hours and 1 dose of 1mg  Ativan po for anxiety. She ate 2 bites of pudding for breakfast and only took a couple sips of soda.  [facility] is currently working with family on discharge planning.  Husband stated he is awaiting the arrival of patient's sister, [name] to finalize plans in regards to the possibility of transferring to [facility].    Please call with any questions.  Thank You, Freddi Starr RN, [facility] Liaison [phone]

## 2016-01-02 NOTE — Discharge Summary (Signed)
Physician Discharge Summary  SHAQUETTA ARCOS JGG:[id number] DOB: 21-Apr-1956 DOA: 12/31/2015  PCP: Sandi Mariscal, MD  Admit date: 12/31/2015 Discharge date: 01/02/2016  Time spent: 20 minutes  Recommendations for Outpatient Follow-up:  1. Follow up with facility provider   Discharge Diagnoses:  Principal Problem:   Acute encephalopathy Active Problems:   Elevated liver enzymes   Protein-calorie malnutrition, severe (HCC)   Breast cancer metastasized to multiple sites [facility])   Metastatic cancer to brain [facility])   Discharge Condition: Stable  Diet recommendation: Dysphagia 1 with thin liquids, as tolerated  There were no vitals filed for this visit.  History of present illness:  Please review dictated H and P from 1/3 for details. Briefly, 60 y.o. female with past medical history of metastatic breast cancer with metastatic disease to the lungs, liver, brain, along with a history of diabetes. Pt presented with confusion.  Hospital Course:  #1 acute encephalopathy with dehydration and failure to thrive: This is all in the setting of metastatic breast cancer. Patient's prognosis is very poor with anticipated days to weeks. Patient's husband is unable to take care of her at home. For now, pt is continued on IV fluids. Cont analgesia as tolerated. Patient will benefit greatly from residential hospice facility. Appreciate Education officer, museum assistance.  #2 Metastatic breast cancer with metastases to multiple sites: She appears to have end-stage cancer. Consulted social worker to facilitate placement to residential hospice. Her life expectancy is probably days to 2-3 weeks (less than month per Oncology). For now, continue her dexamethasone and other medicines for now. Patient is continued on Keppra for seizure prophylaxis.  #3 history of diabetes, on insulin: Patient noted to be hypoglycemic this admission and lantus stopped. Recommend home SSI coverage on discharge.  #4 Abnormal chest x-ray: White cell  count remained normal and patient remained afebrile. It's more likely that the abnormality seen on the chest x-ray is a metastatic process.  #5 1/2 positive blood cultures, bacteremia is ruled out: 1/2 blood cultures thus far pos for gm pos in clusters, later found to be coag neg staph, which is more suggestive of contaminant and NOT true bacteremia. Empiric vanc was discontinued  Consultations:  [name] - Oncology  Discharge Exam: Filed Vitals:   12/31/15 1910 01/01/16 1306 01/01/16 2100 01/02/16 0531  BP:  124/97 144/111 140/90  Pulse: 104 109 85 89  Temp: 97.7 F (36.5 C) 98 F (36.7 C) 98 F (36.7 C) 98.2 F (36.8 C)  TempSrc: Oral Oral Oral Oral  Resp: _0 Height: _1  (1.651 m)     SpO2: 97% 98% 98% 97%    General: Arousable, confused Cardiovascular: regular, s1, s2 Respiratory: normal resp effort, no wheezing  Discharge Instructions     Medication List    STOP taking these medications        ACCU-CHEK AVIVA PLUS test strip  Generic drug:  glucose blood     ACCU-CHEK AVIVA PLUS w/Device Kit     ACCU-CHEK SOFTCLIX LANCETS lancets     fluoxymesterone 10 MG tablet  Commonly known as:  HALOTESTIN     glucose monitoring kit monitoring kit     insulin glargine 100 UNIT/ML injection  Commonly known as:  LANTUS     Insulin Pen Needle 32G X 8 MM Misc     levofloxacin 750 MG tablet  Commonly known as:  LEVAQUIN     ondansetron 8 MG tablet  Commonly known as:  ZOFRAN  Replaced by:  ondansetron 4  MG/2ML Soln injection     oxyCODONE 5 MG immediate release tablet  Commonly known as:  Oxy IR/ROXICODONE  Replaced by:  oxyCODONE 5 MG/5ML solution     pantoprazole 40 MG tablet  Commonly known as:  PROTONIX     potassium chloride SA 20 MEQ tablet  Commonly known as:  K-DUR,KLOR-CON     PROAIR HFA 108 (90 Base) MCG/ACT inhaler  Generic drug:  albuterol  Replaced by:  albuterol (2.5 MG/3ML) 0.083% nebulizer solution     ROBAFEN CF PO      sodium hypochlorite external solution  Commonly known as:  DAKIN'S 1/2 STRENGTH     tobramycin 0.3 % ophthalmic solution  Commonly known as:  TOBREX      TAKE these medications        albuterol (2.5 MG/3ML) 0.083% nebulizer solution  Commonly known as:  PROVENTIL  Take 3 mLs (2.5 mg total) by nebulization every 2 (two) hours as needed for wheezing.     bisacodyl 10 MG suppository  Commonly known as:  DULCOLAX  Place 10 mg rectally as needed for moderate constipation.     [employer] SENNA 8.6 MG tablet  Generic drug:  senna  Take 1 tablet by mouth daily.     senna 8.6 MG Tabs tablet  Commonly known as:  SENOKOT  Take 1 tablet (8.6 mg total) by mouth at bedtime as needed for mild constipation.     dexamethasone 2 MG tablet  Commonly known as:  DECADRON  Take 1 tablet (2 mg total) by mouth every 12 (twelve) hours.     DM MAX MAXIMUM STRENGTH 5-100 MG/5ML Liqd  Generic drug:  Dextromethorphan-Guaifenesin  Take 20 mLs by mouth every 4 (four) hours as needed (cough/congestion.).     feeding supplement (ENSURE ENLIVE) Liqd  Take 237 mLs by mouth 2 (two) times daily between meals.     gabapentin 300 MG capsule  Commonly known as:  NEURONTIN  Take 1 capsule (300 mg total) by mouth 3 (three) times daily.     haloperidol 0.5 MG tablet  Commonly known as:  HALDOL  Take 1 tablet (0.5 mg total) by mouth every 4 (four) hours as needed for agitation (or delirium).     haloperidol 2 MG/ML solution  Commonly known as:  HALDOL  Place 0.3 mLs (0.6 mg total) under the tongue every 4 (four) hours as needed for agitation (or delirium).     haloperidol lactate 5 MG/ML injection  Commonly known as:  HALDOL  Inject 0.1 mLs (0.5 mg total) into the vein every 4 (four) hours as needed (or delirium).     insulin aspart 100 UNIT/ML FlexPen  Commonly known as:  NOVOLOG FLEXPEN  0-9 Units, Subcutaneous, 3 times daily with meals CBG < 70: call MD CBG 70 - 120: 0 units CBG 121 - 150: 1 unit CBG 151 -  200: 2 units CBG 201 - 250: 3 units CBG 251 - 300: 5 units CBG 301 - 350: 7 units CBG 351 - 400: 9 units CBG > 400: call MD  Okay to substitute with different band at equivalent dosing     levETIRAcetam 500 MG tablet  Commonly known as:  KEPPRA  Take 1 tablet (500 mg total) by mouth 2 (two) times daily.     LORazepam 1 MG tablet  Commonly known as:  ATIVAN  Take 1 tablet (1 mg total) by mouth every 4 (four) hours as needed for anxiety.     LORazepam 2 MG/ML  concentrated solution  Commonly known as:  ATIVAN  Place 0.5 mLs (1 mg total) under the tongue every 4 (four) hours as needed for anxiety.     LORazepam 2 MG/ML injection  Commonly known as:  ATIVAN  Inject 0.5 mLs (1 mg total) into the vein every 4 (four) hours as needed for anxiety.     mineral oil enema  Place 1 enema rectally as needed for severe constipation.     mometasone 0.1 % cream  Commonly known as:  ELOCON  Apply to lips 2x a day     nystatin 100000 UNIT/ML suspension  Commonly known as:  MYCOSTATIN  Take 5 mLs (500,000 Units total) by mouth 4 (four) times daily as needed (mouth pain).     ondansetron 4 MG disintegrating tablet  Commonly known as:  ZOFRAN-ODT  Take 1 tablet (4 mg total) by mouth every 6 (six) hours as needed for nausea.     ondansetron 4 MG/2ML Soln injection  Commonly known as:  ZOFRAN  Inject 2 mLs (4 mg total) into the vein every 6 (six) hours as needed for nausea.     oxyCODONE 5 MG/5ML solution  Commonly known as:  ROXICODONE  Take 5 mLs (5 mg total) by mouth every 2 (two) hours as needed for moderate pain (or dyspnea).     oxyCODONE 20 MG/ML concentrated solution  Commonly known as:  ROXICODONE INTENSOL  Place 0.3 mLs (6 mg total) under the tongue every 2 (two) hours as needed for moderate pain (or dyspnea).     polyethylene glycol packet  Commonly known as:  MIRALAX  Take 17 g by mouth daily.     polyvinyl alcohol 1.4 % ophthalmic solution  Commonly known as:  LIQUIFILM TEARS   Place 1 drop into both eyes 4 (four) times daily as needed for dry eyes.      ASK your doctor about these medications        fluconazole 100 MG tablet  Commonly known as:  DIFLUCAN  Take 1 tablet (100 mg total) by mouth daily.       Allergies  Allergen Reactions  . Hydrocodone     "makes me crawl on the floor"  . Penicillins Itching    Per pt's sister, swelling of throat Has patient had a PCN reaction causing immediate rash, facial/tongue/throat swelling, SOB or lightheadedness with hypotension: No, just itching Has patient had a PCN reaction causing severe rash involving mucus membranes or skin necrosis: No Has patient had a PCN reaction that required hospitalization No Has patient had a PCN reaction occurring within the last 10 years: Yes If all of the above answers are "NO", then may proceed with Cephalosporin use.   . Aspirin Other (See Comments)    Due to ulcers      The results of significant diagnostics from this hospitalization (including imaging, microbiology, ancillary and laboratory) are listed below for reference.    Significant Diagnostic Studies: Dg Chest 2 View  12/20/2015  CLINICAL DATA:  Acute onset of generalized weakness and shortness of breath. Cough. Initial encounter. EXAM: CHEST  2 VIEW COMPARISON:  Chest radiograph performed 11/09/2015, and CT of the chest, abdomen and pelvis from 09/04/2015 FINDINGS: The lungs are well-aerated. Small bilateral pleural effusions are noted, with bilateral central airspace opacities. This may reflect multifocal pneumonia or pulmonary edema. Known underlying pulmonary metastases are difficult to fully assess on radiograph. No pneumothorax is seen. The heart is normal in size; the patient's left-sided chest port is  noted ending about the mid SVC. Known osseous metastatic disease is not well characterized on radiograph. IMPRESSION: 1. Small bilateral pleural effusions, with bilateral central airspace opacities. This may reflect  multifocal pneumonia or pulmonary edema. 2. Known underlying pulmonary metastases and bony metastases are not well characterized on radiograph. Electronically Signed   By: Garald Balding M.D.   On: 12/20/2015 21:37   Dg Chest Port 1 View  12/31/2015  CLINICAL DATA:  Altered mental status.  History of breast carcinoma EXAM: PORTABLE CHEST 1 VIEW COMPARISON:  December 20, 2015 FINDINGS: There are bilateral pleural effusions. There is left mid lung consolidation, new. There is a right perihilar nodular lesion, consistent with a metastatic focus. Heart is upper normal in size. The pulmonary vascular is within normal limits. Prominence in the azygos region and superior right hilar region is concerning for adenopathy. There is a destructive lesion involving portions of the left ninth rib. Port-A-Cath tip is in the superior vena cava. No pneumothorax. IMPRESSION: Areas of metastatic disease. Airspace consolidation left mid lung, new. Small pleural effusions present. No change in cardiac silhouette. Electronically Signed   By: Lowella Grip III M.D.   On: 12/31/2015 14:30    Microbiology: Recent Results (from the past 240 hour(s))  Blood culture (routine x 2)     Status: None (Preliminary result)   Collection Time: 12/31/15  3:20 PM  Result Value Ref Range Status   Specimen Description BLOOD RIGHT ANTECUBITAL  Final   Special Requests BOTTLES DRAWN AEROBIC AND ANAEROBIC 5CC EACH  Final   Culture   Final    NO GROWTH 2 DAYS Performed at [facility]    Report Status PENDING  Incomplete  Blood culture (routine x 2)     Status: None (Preliminary result)   Collection Time: 12/31/15  3:30 PM  Result Value Ref Range Status   Specimen Description BLOOD LEFT WRIST  Final   Special Requests IN PEDIATRIC BOTTLE 2CC  Final   Culture  Setup Time   Final    GRAM POSITIVE COCCI IN CLUSTERS IN PEDIATRIC BOTTLE CRITICAL RESULT CALLED TO, READ BACK BY AND VERIFIED WITH: Barbara Cower RN 17:50 01/01/16  ([email or web address])    Culture   Final    STAPHYLOCOCCUS SPECIES (COAGULASE NEGATIVE) THE SIGNIFICANCE OF ISOLATING THIS ORGANISM FROM A SINGLE SET OF BLOOD CULTURES WHEN MULTIPLE SETS ARE DRAWN IS UNCERTAIN. PLEASE NOTIFY THE MICROBIOLOGY DEPARTMENT WITHIN ONE WEEK IF SPECIATION AND SENSITIVITIES ARE REQUIRED. Performed at [facility]    Report Status PENDING  Incomplete     Labs: Basic Metabolic Panel:  Recent Labs Lab 12/31/15 1523  NA 141  K 3.8  CL 107  CO2 24  GLUCOSE 80  BUN 13  CREATININE 0.44  CALCIUM 8.8*   Liver Function Tests:  Recent Labs Lab 12/31/15 1523  AST 112*  ALT 187*  ALKPHOS 231*  BILITOT 1.2  PROT 5.8*  ALBUMIN 2.8*   No results for input(s): LIPASE, AMYLASE in the last 168 hours. No results for input(s): AMMONIA in the last 168 hours. CBC:  Recent Labs Lab 12/31/15 1523  WBC 7.5  NEUTROABS 5.5  HGB 12.7  HCT 39.9  MCV 103.9*  PLT 261   Cardiac Enzymes: No results for input(s): CKTOTAL, CKMB, CKMBINDEX, TROPONINI in the last 168 hours. BNP: BNP (last 3 results)  Recent Labs  12/20/15 2120  BNP 29.5    ProBNP (last 3 results) No results for input(s): PROBNP in the last 8760 hours.  CBG:  Recent Labs Lab 01/01/16 1143 01/01/16 1627 01/01/16 2150 01/02/16 0750 01/02/16 1253  GLUCAP 181* 157* 172* 160* 117*     Signed:  Garnet Chatmon K  [facility] 01/02/2016, 12:58 PM

## 2016-01-02 NOTE — NC FL2 (Signed)
[place] LEVEL OF CARE SCREENING TOOL     IDENTIFICATION  Patient Name: Mary [name] Birthdate: [birth year] Sex: female Admission Date (Current Location): 12/31/2015  [place] and [place] Number:  Kathleen Argue [id number] [place] and Address:  [facility],  [address] [place], [place]      Provider Number: [id number]  Attending Physician Name and Address:  Donne Hazel, MD  Relative Name and Phone Number:       Current Level of Care: Hospital Recommended Level of Care: [place] Prior Approval Number:    Date Approved/Denied: 01/01/16 PASRR Number:  Discharge Plan: SNF    Current Diagnoses: Patient Active Problem List   Diagnosis Date Noted  . Acute encephalopathy 12/31/2015  . Constipation 12/21/2015  . UTI (urinary tract infection) 12/21/2015  . AP (abdominal pain)   . Palliative care encounter 11/07/2015  . DNR (do not resuscitate) discussion 11/07/2015  . Altered mental status   . Anemia, unspecified 11/06/2015  . Thrombocytopenia ([place]) 11/06/2015  . HCAP (healthcare-associated pneumonia) 11/05/2015  . 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 Metastatic cancer to brain ([place])   . Hyperglycemia 10/06/2015  . Seizure ([place]) 10/06/2015  . Status epilepticus ([place]) 10/06/2015  . Wound infection ([place]) 10/06/2015  . over 75 brain metastases, several showing hemorrhage 03/04/2015  . Wound of right breast 08/06/2014  . Breast cancer metastasized to multiple sites ([place]) 04/12/2014  . Protein-calorie malnutrition, severe ([place]) 04/06/2014  . Fever, unspecified 04/05/2014  . Right hip pain 04/05/2014  . Abdominal tenderness, generalized 04/05/2014  . Back pain 04/05/2014  . Cellulitis 04/05/2014  . Migraine headache 04/05/2014  . Elevated liver enzymes 04/05/2014  . Sepsis ([place]) 04/05/2014  . Preventative health care 07/04/2013  . Anxiety state, unspecified 07/04/2013  . Arthritis   . Depression   .  Hypertension   . Migraines     Orientation RESPIRATION BLADDER Height & Weight    Self, Time  Normal Incontinent, Indwelling catheter 5\' 5"  (165.1 cm)    BEHAVIORAL SYMPTOMS/MOOD NEUROLOGICAL BOWEL NUTRITION STATUS   (no behaviors) Convulsions/Seizures (hx of seizures) Incontinent Diet (DIET - DYS 1 )  AMBULATORY STATUS COMMUNICATION OF NEEDS Skin   Total Care Verbally Other (Comment) (Incision (closed) right breast, gauze dressing)                       Personal Care Assistance Level of Assistance  Bathing, Feeding, Dressing Bathing Assistance: Maximum assistance Feeding assistance: Limited assistance Dressing Assistance: Maximum assistance     Functional Limitations Info  Sight, Hearing, Speech Sight Info: Adequate Hearing Info: Adequate Speech Info: Adequate    SPECIAL CARE FACTORS FREQUENCY                       Contractures Contractures Info: Not present    Additional Factors Info  Code Status, Allergies, Insulin Sliding Scale Code Status Info: DNR code status Allergies Info: Hydrocodone, Penicillins, Aspirin   Insulin Sliding Scale Info: 3 x a day       Current Medications (01/02/2016):  This is the current hospital active medication list Current Facility-Administered Medications  Medication Dose Route Frequency Provider Last Rate Last Dose  . acetaminophen (TYLENOL) tablet 650 mg  650 mg Oral Q6H PRN Bonnielee Haff, MD       Or  . acetaminophen (TYLENOL) suppository 650 mg  650 mg Rectal Q6H PRN Bonnielee Haff, MD      . albuterol (PROVENTIL) (2.5 MG/3ML)  0.083% nebulizer solution 2.5 mg  2.5 mg Nebulization Q2H PRN Bonnielee Haff, MD      . bisacodyl (DULCOLAX) suppository 10 mg  10 mg Rectal PRN Bonnielee Haff, MD      . dexamethasone (DECADRON) tablet 2 mg  2 mg Oral Q12H Bonnielee Haff, MD   2 mg at 01/02/16 1006  . dextrose 5 % solution   Intravenous Continuous Donne Hazel, MD 50 mL/hr at 01/01/16 2323    . feeding supplement (ENSURE  ENLIVE) (ENSURE ENLIVE) liquid 237 mL  237 mL Oral BID BM Bonnielee Haff, MD   237 mL at 01/01/16 0942  . gabapentin (NEURONTIN) capsule 300 mg  300 mg Oral TID Bonnielee Haff, MD   300 mg at 01/02/16 1006  . haloperidol (HALDOL) tablet 0.5 mg  0.5 mg Oral Q4H PRN Bonnielee Haff, MD       Or  . haloperidol (HALDOL) 2 MG/ML solution 0.5 mg  0.5 mg Sublingual Q4H PRN Bonnielee Haff, MD       Or  . haloperidol lactate (HALDOL) injection 0.5 mg  0.5 mg Intravenous Q4H PRN Bonnielee Haff, MD      . insulin aspart (novoLOG) injection 0-9 Units  0-9 Units Subcutaneous TID WC Bonnielee Haff, MD   2 Units at 01/02/16 0815  . insulin glargine (LANTUS) injection 5 Units  5 Units Subcutaneous QHS Donne Hazel, MD   5 Units at 01/01/16 2200  . levETIRAcetam (KEPPRA) tablet 500 mg  500 mg Oral BID Bonnielee Haff, MD   500 mg at 01/02/16 1006  . lip balm (CARMEX) ointment           . LORazepam (ATIVAN) tablet 1 mg  1 mg Oral Q4H PRN Bonnielee Haff, MD       Or  . LORazepam (ATIVAN) 2 MG/ML concentrated solution 1 mg  1 mg Sublingual Q4H PRN Bonnielee Haff, MD       Or  . LORazepam (ATIVAN) injection 1 mg  1 mg Intravenous Q4H PRN Bonnielee Haff, MD   1 mg at 01/02/16 1119  . ondansetron (ZOFRAN-ODT) disintegrating tablet 4 mg  4 mg Oral Q6H PRN Bonnielee Haff, MD       Or  . ondansetron (ZOFRAN) injection 4 mg  4 mg Intravenous Q6H PRN Bonnielee Haff, MD      . oxyCODONE (ROXICODONE) 5 MG/5ML solution 5 mg  5 mg Oral Q2H PRN Bonnielee Haff, MD   5 mg at 01/02/16 1124   Or  . oxyCODONE (ROXICODONE INTENSOL) 20 MG/ML concentrated solution 5 mg  5 mg Sublingual Q2H PRN Bonnielee Haff, MD   5 mg at 01/02/16 0945  . polyethylene glycol (MIRALAX / GLYCOLAX) packet 17 g  17 g Oral Daily Bonnielee Haff, MD   17 g at 01/01/16 0943  . polyvinyl alcohol (LIQUIFILM TEARS) 1.4 % ophthalmic solution 1 drop  1 drop Both Eyes QID PRN Bonnielee Haff, MD      . senna (SENOKOT) tablet 8.6 mg  1 tablet Oral QHS PRN Bonnielee Haff, MD         Discharge Medications: Please see discharge summary for a list of discharge medications.  Relevant Imaging Results:  Relevant Lab Results:   Additional Information SSN: [id number]  KIDD, [place] A, LCSW

## 2016-01-02 NOTE — Clinical Social Work Note (Signed)
Clinical Social Work Assessment  Patient Details  Name: Mary [name] MRN: [id number] Date of Birth: Dec 23, 1956  Date of referral:  01/02/16               Reason for consult:  Discharge Planning                Permission sought to share information with:  Family Supports Permission granted to share information::     Name::     Alexia Dinger  Agency::     Relationship::  spouse  Contact Information:  [phone]  Housing/Transportation Living arrangements for the past 2 months:  Single Family Home Source of Information:  Spouse Patient Interpreter Needed:  None Criminal Activity/Legal Involvement Pertinent to Current Situation/Hospitalization:  No - Comment as needed Significant Relationships:  Spouse, Siblings Lives with:  Spouse Do you feel safe going back to the place where you live?  No Need for family participation in patient care:  Yes (Comment)  Care giving concerns:  Pt admitted from home with hospice. Pt family deciding plan for discharge [facility] vs. SNF.   Social Worker assessment / plan:    CSW met with pt husband at bedside to discuss disposition plan as [facility] SW had discussed with pt husband and pt sister yesterday regarding [facility] availability vs SNF vs returning home. Pt husband states that decision is for pt to go to [facility].   CSW confirmed with [facility] SW, Trucia [place] that [facility] bed available today.  CSW notified pt spouse at bedside.  CSW facilitated pt discharge needs including faxing pt discharge summary to [facility], providing RN phone number to call report, discussing with pt spouse at bedside, and arranging ambulance transport for pt to [facility].  No further social work needs identified at this time.  CSW signing off.   Employment status:  Disabled (Comment on whether or not currently receiving Disability) Insurance information:  Medicaid [place] PT Recommendations:  Not assessed at this time Information /  Referral to community resources:  Other (Comment Required) (referral to [facility])  Patient/Family's Response to care:  Pt spouse anxious about transition, but agreeable to [place] arranging ambulance transport to [facility].  Patient/Family's Understanding of and Emotional Response to Diagnosis, Current Treatment, and Prognosis:  Pt spouse having difficulty coping with plans for [facility], but agreeable to trying [facility] at this time.  Emotional Assessment Appearance:  Appears stated age Attitude/Demeanor/Rapport:  Unable to Assess Affect (typically observed):  Unable to Assess Orientation:  Oriented to Self, Oriented to Situation Alcohol / Substance use:  Not Applicable Psych involvement (Current and /or in the community):  No (Comment)  Discharge Needs  Concerns to be addressed:  Discharge Planning Concerns Readmission within the last 30 days:  No Current discharge risk:  None Barriers to Discharge:  No Barriers Identified   Pt discharged to [facility].    KIDD, [place], LCSW 01/02/2016, 2:50 PM  [phone]

## 2016-01-03 LAB — CULTURE, BLOOD (ROUTINE X 2)

## 2016-01-05 LAB — CULTURE, BLOOD (ROUTINE X 2): CULTURE: NO GROWTH

## 2016-01-13 LAB — HIV ANTIBODY (ROUTINE TESTING W REFLEX): HIV Screen 4th Generation wRfx: NONREACTIVE

## 2016-01-23 ENCOUNTER — Other Ambulatory Visit: Payer: Self-pay | Admitting: *Deleted

## 2016-01-23 DIAGNOSIS — S21001A Unspecified open wound of right breast, initial encounter: Secondary | ICD-10-CM

## 2016-01-23 LAB — HEMOGLOBIN A1C
HEMOGLOBIN A1C: 6.7 %
MEAN PLASMA GLUCOSE: 146

## 2016-01-23 MED ORDER — TOBRAMYCIN 0.3 % OP SOLN: 2.0000 [drp] | Freq: Four times a day (QID) | OPHTHALMIC | Status: AC

## 2016-01-29 ENCOUNTER — Encounter (HOSPITAL_COMMUNITY): Payer: Self-pay | Admitting: Emergency Medicine

## 2016-01-29 ENCOUNTER — Observation Stay (HOSPITAL_COMMUNITY)
Admission: EM | Admit: 2016-01-29 | Discharge: 2016-01-30 | Disposition: A | Discharge status: Home / Self Care | Attending: Internal Medicine | Admitting: Internal Medicine

## 2016-01-29 ENCOUNTER — Emergency Department (HOSPITAL_COMMUNITY)

## 2016-01-29 DIAGNOSIS — I10 Essential (primary) hypertension: Secondary | ICD-10-CM | POA: Diagnosis not present

## 2016-01-29 DIAGNOSIS — R000 Tachycardia, unspecified: Secondary | ICD-10-CM | POA: Diagnosis not present

## 2016-01-29 DIAGNOSIS — M1990 Unspecified osteoarthritis, unspecified site: Secondary | ICD-10-CM | POA: Insufficient documentation

## 2016-01-29 DIAGNOSIS — Z794 Long term (current) use of insulin: Secondary | ICD-10-CM | POA: Insufficient documentation

## 2016-01-29 DIAGNOSIS — K259 Gastric ulcer, unspecified as acute or chronic, without hemorrhage or perforation: Secondary | ICD-10-CM | POA: Diagnosis not present

## 2016-01-29 DIAGNOSIS — G43909 Migraine, unspecified, not intractable, without status migrainosus: Secondary | ICD-10-CM | POA: Diagnosis not present

## 2016-01-29 DIAGNOSIS — C50919 Malignant neoplasm of unspecified site of unspecified female breast: Secondary | ICD-10-CM | POA: Diagnosis present

## 2016-01-29 DIAGNOSIS — Z880 Allergy status to penicillin: Secondary | ICD-10-CM | POA: Insufficient documentation

## 2016-01-29 DIAGNOSIS — R569 Unspecified convulsions: Secondary | ICD-10-CM

## 2016-01-29 DIAGNOSIS — S21001A Unspecified open wound of right breast, initial encounter: Secondary | ICD-10-CM

## 2016-01-29 DIAGNOSIS — R0603 Acute respiratory distress: Secondary | ICD-10-CM | POA: Diagnosis present

## 2016-01-29 DIAGNOSIS — R0600 Dyspnea, unspecified: Principal | ICD-10-CM | POA: Insufficient documentation

## 2016-01-29 DIAGNOSIS — Z853 Personal history of malignant neoplasm of breast: Secondary | ICD-10-CM | POA: Diagnosis not present

## 2016-01-29 DIAGNOSIS — J9601 Acute respiratory failure with hypoxia: Secondary | ICD-10-CM

## 2016-01-29 DIAGNOSIS — C50911 Malignant neoplasm of unspecified site of right female breast: Secondary | ICD-10-CM | POA: Diagnosis not present

## 2016-01-29 DIAGNOSIS — C7931 Secondary malignant neoplasm of brain: Secondary | ICD-10-CM

## 2016-01-29 DIAGNOSIS — F329 Major depressive disorder, single episode, unspecified: Secondary | ICD-10-CM | POA: Insufficient documentation

## 2016-01-29 DIAGNOSIS — Z79899 Other long term (current) drug therapy: Secondary | ICD-10-CM | POA: Diagnosis not present

## 2016-01-29 LAB — CBC WITH DIFFERENTIAL/PLATELET
BASOS ABS: 0 10*3/uL (ref 0.0–0.1)
Basophils Relative: 0 %
Eosinophils Absolute: 0 10*3/uL (ref 0.0–0.7)
Eosinophils Relative: 0 %
HEMATOCRIT: 44.9 % (ref 36.0–46.0)
HEMOGLOBIN: 15 g/dL (ref 12.0–15.0)
LYMPHS PCT: 17 %
Lymphs Abs: 1.8 10*3/uL (ref 0.7–4.0)
MCH: 33 pg (ref 26.0–34.0)
MCHC: 33.4 g/dL (ref 30.0–36.0)
MCV: 98.7 fL (ref 78.0–100.0)
MONO ABS: 0.4 10*3/uL (ref 0.1–1.0)
MONOS PCT: 4 %
NEUTROS ABS: 8.2 10*3/uL — AB (ref 1.7–7.7)
Neutrophils Relative %: 79 %
Platelets: 222 10*3/uL (ref 150–400)
RBC: 4.55 MIL/uL (ref 3.87–5.11)
RDW: 17.4 % — AB (ref 11.5–15.5)
WBC: 10.4 10*3/uL (ref 4.0–10.5)

## 2016-01-29 LAB — I-STAT CG4 LACTIC ACID, ED: Lactic Acid, Venous: 4 mmol/L (ref 0.5–2.0)

## 2016-01-29 LAB — URINALYSIS, ROUTINE W REFLEX MICROSCOPIC
Glucose, UA: NEGATIVE mg/dL
Ketones, ur: NEGATIVE mg/dL
Nitrite: POSITIVE — AB
Protein, ur: 30 mg/dL — AB
SPECIFIC GRAVITY, URINE: 1.035 — AB (ref 1.005–1.030)
pH: 6 (ref 5.0–8.0)

## 2016-01-29 LAB — URINE MICROSCOPIC-ADD ON

## 2016-01-29 MED ORDER — DEXTROSE 5 % IV SOLN
1.0000 g | Freq: Every day | INTRAVENOUS | Status: DC
  Administered 2016-01-29: 1 g via INTRAVENOUS
  Filled 2016-01-29: qty 10

## 2016-01-29 MED ORDER — ONDANSETRON HCL 4 MG PO TABS: 4.0000 mg | ORAL_TABLET | Freq: Four times a day (QID) | ORAL | Status: DC | PRN

## 2016-01-29 MED ORDER — ACETAMINOPHEN 325 MG PO TABS: 650.0000 mg | ORAL_TABLET | Freq: Four times a day (QID) | ORAL | Status: DC | PRN

## 2016-01-29 MED ORDER — ALBUTEROL SULFATE (2.5 MG/3ML) 0.083% IN NEBU
2.5000 mg | INHALATION_SOLUTION | RESPIRATORY_TRACT | Status: DC | PRN
  Administered 2016-01-30: 2.5 mg via RESPIRATORY_TRACT
  Filled 2016-01-29: qty 3

## 2016-01-29 MED ORDER — MINERAL OIL RE ENEM
1.0000 | ENEMA | RECTAL | Status: DC | PRN
  Filled 2016-01-29: qty 1

## 2016-01-29 MED ORDER — ALUM & MAG HYDROXIDE-SIMETH 200-200-20 MG/5ML PO SUSP: 30.0000 mL | Freq: Four times a day (QID) | ORAL | Status: DC | PRN

## 2016-01-29 MED ORDER — ENSURE ENLIVE PO LIQD: 237.0000 mL | Freq: Two times a day (BID) | ORAL | Status: DC

## 2016-01-29 MED ORDER — SENNA 8.6 MG PO TABS: 1.0000 | ORAL_TABLET | Freq: Every day | ORAL | Status: DC

## 2016-01-29 MED ORDER — TOBRAMYCIN 0.3 % OP SOLN
2.0000 [drp] | Freq: Four times a day (QID) | OPHTHALMIC | Status: DC
  Administered 2016-01-29: 2 [drp] via OPHTHALMIC
  Filled 2016-01-29: qty 5

## 2016-01-29 MED ORDER — FENTANYL CITRATE (PF) 100 MCG/2ML IJ SOLN
50.0000 ug | Freq: Once | INTRAMUSCULAR | Status: AC
  Administered 2016-01-29: 50 ug via INTRAVENOUS
  Filled 2016-01-29: qty 2

## 2016-01-29 MED ORDER — HALOPERIDOL 0.5 MG PO TABS
0.5000 mg | ORAL_TABLET | ORAL | Status: DC | PRN
  Filled 2016-01-29: qty 1

## 2016-01-29 MED ORDER — MORPHINE SULFATE (CONCENTRATE) 10 MG/0.5ML PO SOLN
10.0000 mg | ORAL | Status: DC | PRN
  Administered 2016-01-30 (×4): 10 mg via ORAL
  Filled 2016-01-29 (×4): qty 0.5

## 2016-01-29 MED ORDER — BISACODYL 10 MG RE SUPP: 10.0000 mg | RECTAL | Status: DC | PRN

## 2016-01-29 MED ORDER — ACETAMINOPHEN 650 MG RE SUPP: 650.0000 mg | Freq: Four times a day (QID) | RECTAL | Status: DC | PRN

## 2016-01-29 MED ORDER — GUAIFENESIN-DM 100-10 MG/5ML PO SYRP: 10.0000 mL | ORAL_SOLUTION | ORAL | Status: DC | PRN

## 2016-01-29 MED ORDER — LEVETIRACETAM 500 MG PO TABS: 500.0000 mg | ORAL_TABLET | Freq: Two times a day (BID) | ORAL | Status: DC

## 2016-01-29 MED ORDER — SODIUM CHLORIDE 0.9 % IV BOLUS (SEPSIS)
500.0000 mL | Freq: Once | INTRAVENOUS | Status: AC
  Administered 2016-01-29: 500 mL via INTRAVENOUS

## 2016-01-29 MED ORDER — DEXAMETHASONE 4 MG PO TABS: 2.0000 mg | ORAL_TABLET | Freq: Two times a day (BID) | ORAL | Status: DC

## 2016-01-29 MED ORDER — LORAZEPAM 2 MG/ML IJ SOLN: 2.0000 mg | Freq: Once | INTRAMUSCULAR | Status: DC

## 2016-01-29 MED ORDER — LORAZEPAM 2 MG/ML IJ SOLN: 1.0000 mg | INTRAMUSCULAR | Status: DC | PRN

## 2016-01-29 MED ORDER — GABAPENTIN 300 MG PO CAPS: 300.0000 mg | ORAL_CAPSULE | Freq: Three times a day (TID) | ORAL | Status: DC

## 2016-01-29 MED ORDER — HALOPERIDOL LACTATE 5 MG/ML IJ SOLN: 0.5000 mg | INTRAMUSCULAR | Status: DC | PRN

## 2016-01-29 MED ORDER — POLYVINYL ALCOHOL 1.4 % OP SOLN
1.0000 [drp] | Freq: Four times a day (QID) | OPHTHALMIC | Status: DC | PRN
  Filled 2016-01-29: qty 15

## 2016-01-29 MED ORDER — SODIUM CHLORIDE 0.9 % IV SOLN
INTRAVENOUS | Status: DC
  Administered 2016-01-29: 22:00:00 via INTRAVENOUS

## 2016-01-29 MED ORDER — ONDANSETRON HCL 4 MG/2ML IJ SOLN: 4.0000 mg | Freq: Four times a day (QID) | INTRAMUSCULAR | Status: DC | PRN

## 2016-01-29 MED ORDER — ENOXAPARIN SODIUM 30 MG/0.3ML ~~LOC~~ SOLN: 30.0000 mg | SUBCUTANEOUS | Status: DC

## 2016-01-29 MED ORDER — OXYCODONE HCL 5 MG/5ML PO SOLN
5.0000 mg | ORAL | Status: DC | PRN
  Administered 2016-01-30: 5 mg via ORAL
  Filled 2016-01-29: qty 5

## 2016-01-29 MED ORDER — DEXTROMETHORPHAN-GUAIFENESIN 5-100 MG/5ML PO LIQD: 20.0000 mL | ORAL | Status: DC | PRN

## 2016-01-29 NOTE — Progress Notes (Addendum)
[facility] consulted EDSW for transfer to [facility].  EDCM spoke to patient's sister at bedside.  Patient's sister reports husband took patient out of [facility].  Patient's sister would like patient to go back there. Patient's sister reports patient does not wear oxygen at home but states she was on oxygen at [facility].  Patient now on 5 liters oxygen via nasal cannula.  Discussed with EDP.  EDSW consult placed.

## 2016-01-29 NOTE — ED Notes (Signed)
Per [facility], pt was home, has Hospice, stage IV/V breast cancer, diaphoretic, wheezing all lobes, tachycardic.

## 2016-01-29 NOTE — ED Notes (Signed)
Bed: WA02 Expected date:  Expected time:  Means of arrival:  Comments: EMS-SOB 

## 2016-01-29 NOTE — Progress Notes (Signed)
CSW was notified by Nurse CM that patient's family is interested in [facility].  CSW attempted to meet with patient at bedside. However, she was asleep.  CSW reached out to Karen/ On Call Triage Nurse of [facility] who states that patient will not be able to come tonight, but the patient is welcomed to come to the facility in the morning. Santiago Glad states that the facility is familiar with patient and that she is still receiving home hospice with facility.   Santiago Glad states that family checked patient out of facility a little over a week ago against their advice.  CSW made EDP aware.  Willette Brace [id number] ED CSW 01/29/2016 9:10 PM

## 2016-01-29 NOTE — H&P (Cosign Needed)
[facility] Admission History and Physical       Mary [name] [id number] DOB: 20-Nov-1956 DOA: 01/29/2016  Referring physician: EDP PCP: Sandi Mariscal, MD  Specialists:   Chief Complaint: SOB  HPI: Mary [name] is a 60 y.o. female with a history of Metastatic Breast Cancer no longer on Cancer treatments and currently on home hospice care who was brought to the ED due to increased SOB and respiratory distress.   She was found to have hypoxia and was placed on 8 liters NCO2.   She was referred for admission and possible placement to [facility] place.       Review of Systems: Unable to Obtain from the Patient    Past Medical History  Diagnosis Date  . Arthritis   . Depression   . Fainting   . Headache   . Hypertension   . Migraines   . History of blood transfusion   . Gastric ulcer   . Breast cancer metastasized to multiple sites [facility]) 04/12/2014    10/30/15 radiation, chemotherapy  . Seizures [facility])      Past Surgical History  Procedure Laterality Date  . Abdominal hysterectomy  15 years go    partial      Prior to Admission medications   Medication Sig Start Date End Date Taking? Authorizing Provider  albuterol (PROVENTIL) (2.5 MG/3ML) 0.083% nebulizer solution Take 3 mLs (2.5 mg total) by nebulization every 2 (two) hours as needed for wheezing. 01/02/16  Yes Donne Hazel, MD  dexamethasone (DECADRON) 2 MG tablet Take 1 tablet (2 mg total) by mouth every 12 (twelve) hours. 11/10/15  Yes Nita Sells, MD  feeding supplement, ENSURE ENLIVE, (ENSURE ENLIVE) LIQD Take 237 mLs by mouth 2 (two) times daily between meals. 11/10/15  Yes Nita Sells, MD  haloperidol (HALDOL) 0.5 MG tablet Take 1 tablet (0.5 mg total) by mouth every 4 (four) hours as needed for agitation (or delirium). 01/02/16  Yes Donne Hazel, MD  Morphine Sulfate (MORPHINE CONCENTRATE) 10 mg / 0.5 ml concentrated solution GIVE 0.5-1MLS EVERY 2HRS AS NEEDED FOR PAIN/SHORTNESS OF BREATH  01/17/16  Yes Historical Provider, MD  oxyCODONE (ROXICODONE) 5 MG/5ML solution Take 5 mLs (5 mg total) by mouth every 2 (two) hours as needed for moderate pain (or dyspnea). 01/02/16  Yes Donne Hazel, MD  polyethylene glycol [facility]) packet Take 17 g by mouth daily. 12/23/15  Yes Verlee Monte, MD  polyvinyl alcohol (LIQUIFILM TEARS) 1.4 % ophthalmic solution Place 1 drop into both eyes 4 (four) times daily as needed for dry eyes. 01/02/16  Yes Donne Hazel, MD  senna (SENOKOT) 8.6 MG TABS tablet Take 1 tablet (8.6 mg total) by mouth at bedtime as needed for mild constipation. 01/02/16  Yes Donne Hazel, MD  tobramycin (TOBREX) 0.3 % ophthalmic solution Place 2 drops into the right eye every 6 (six) hours. 01/23/16 03/16/16 Yes Volanda Napoleon, MD  bisacodyl (DULCOLAX) 10 MG suppository Place 10 mg rectally as needed for moderate constipation.    Historical Provider, MD  [facility] SENNA 8.6 MG tablet Take 1 tablet by mouth daily.  12/08/15   Historical Provider, MD  Dextromethorphan-Guaifenesin (DM MAX MAXIMUM STRENGTH) 5-100 MG/5ML LIQD Take 20 mLs by mouth every 4 (four) hours as needed (cough/congestion.).    Historical Provider, MD  fluconazole (DIFLUCAN) 100 MG tablet Take 1 tablet (100 mg total) by mouth daily. Patient not taking: Reported on 01/29/2016 12/23/15   Verlee Monte, MD  gabapentin (NEURONTIN) 300 MG  capsule Take 1 capsule (300 mg total) by mouth 3 (three) times daily. Patient not taking: Reported on 01/29/2016 08/28/15   Volanda Napoleon, MD  haloperidol (HALDOL) 2 MG/ML solution Place 0.3 mLs (0.6 mg total) under the tongue every 4 (four) hours as needed for agitation (or delirium). 01/02/16   Donne Hazel, MD  haloperidol lactate (HALDOL) 5 MG/ML injection Inject 0.1 mLs (0.5 mg total) into the vein every 4 (four) hours as needed (or delirium). 01/02/16   Donne Hazel, MD  insulin aspart (NOVOLOG FLEXPEN) 100 UNIT/ML FlexPen 0-9 Units, Subcutaneous, 3 times daily with meals CBG < 70: call  MD CBG 70 - 120: 0 units CBG 121 - 150: 1 unit CBG 151 - 200: 2 units CBG 201 - 250: 3 units CBG 251 - 300: 5 units CBG 301 - 350: 7 units CBG 351 - 400: 9 units CBG > 400: call MD  Okay to substitute with different band at equivalent dosing 10/09/15   Shanker Kristeen Mans, MD  levETIRAcetam (KEPPRA) 500 MG tablet Take 1 tablet (500 mg total) by mouth 2 (two) times daily. Patient not taking: Reported on 01/29/2016 11/10/15   Nita Sells, MD  LORazepam (ATIVAN) 1 MG tablet Take 1 tablet (1 mg total) by mouth every 4 (four) hours as needed for anxiety. 01/02/16   Donne Hazel, MD  LORazepam (ATIVAN) 2 MG/ML concentrated solution Place 0.5 mLs (1 mg total) under the tongue every 4 (four) hours as needed for anxiety. 01/02/16   Donne Hazel, MD  LORazepam (ATIVAN) 2 MG/ML injection Inject 0.5 mLs (1 mg total) into the vein every 4 (four) hours as needed for anxiety. 01/02/16   Donne Hazel, MD  mineral oil enema Place 1 enema rectally as needed for severe constipation.    Historical Provider, MD  mometasone (ELOCON) 0.1 % cream Apply to lips 2x a day Patient taking differently: Apply 1 application topically daily as needed (irritation). Apply to lips 2x a day 11/15/15   Volanda Napoleon, MD  nystatin (MYCOSTATIN) 100000 UNIT/ML suspension Take 5 mLs (500,000 Units total) by mouth 4 (four) times daily as needed (mouth pain). 11/10/15   Nita Sells, MD  ondansetron (ZOFRAN) 4 MG/2ML SOLN injection Inject 2 mLs (4 mg total) into the vein every 6 (six) hours as needed for nausea. 01/02/16   Donne Hazel, MD  ondansetron (ZOFRAN-ODT) 4 MG disintegrating tablet Take 1 tablet (4 mg total) by mouth every 6 (six) hours as needed for nausea. 01/02/16   Donne Hazel, MD  oxyCODONE (ROXICODONE INTENSOL) 20 MG/ML concentrated solution Place 0.3 mLs (6 mg total) under the tongue every 2 (two) hours as needed for moderate pain (or dyspnea). 01/02/16   Donne Hazel, MD     Allergies  Allergen  Reactions  . Hydrocodone     "makes me crawl on the floor"  . Penicillins Itching    Per pt's sister, swelling of throat Has patient had a PCN reaction causing immediate rash, facial/tongue/throat swelling, SOB or lightheadedness with hypotension: No, just itching Has patient had a PCN reaction causing severe rash involving mucus membranes or skin necrosis: No Has patient had a PCN reaction that required hospitalization No Has patient had a PCN reaction occurring within the last 10 years: Yes If all of the above answers are "NO", then may proceed with Cephalosporin use.   . Aspirin Other (See Comments)    Due to ulcers    Social History:  reports that she has never smoked. She has never used smokeless tobacco. She reports that she does not drink alcohol or use illicit drugs.    Family History  Problem Relation Age of Onset  . Arthritis Other   . Hypertension Mother   . Hypertension Other   . Heart disease Other   . Stroke Father   . Cancer Sister     leukemia       Physical Exam:  GEN:  Pleasant ill Appearing  60 y.o. African American female examined and in no acute distress; cooperative with exam Filed Vitals:   01/29/16 1816 01/29/16 1900 01/29/16 1945 01/29/16 2000  BP:      Pulse:  128 128   Temp:      TempSrc:      Resp:  17 18 19   SpO2: 99% 97% 98%    Blood pressure 138/95, pulse 128, temperature 98.6 F (37 C), temperature source Oral, resp. rate 19, SpO2 98 %. PSYCH: She is alert and oriented x4;  HEENT: Normocephalic and Atraumatic, Mucous membranes pink; PERRLA; EOM intact; Fundi:  Benign;  No scleral icterus, Nares: Patent, Oropharynx: Unable to visualize,    Neck:  FROM, No Cervical Lymphadenopathy nor Thyromegaly or Carotid Bruit; No JVD; Breasts:: Not examined CHEST WALL: No tenderness CHEST: Normal respiration, clear to auscultation bilaterally HEART: Regular rate and rhythm; no murmurs rubs or gallops BACK: No kyphosis or scoliosis; No CVA  tenderness ABDOMEN: Positive Bowel Sounds, Soft Non-Tender, No Rebound or Guarding; No Masses, No Organomegaly Rectal Exam: Not done EXTREMITIES: No Cyanosis, Clubbing, or Edema; No Ulcerations. Genitalia: not examined PULSES: 2+ and symmetric SKIN: Normal hydration no rash or ulceration CNS:  Alert and Oriented x 4,Generalized Weakness Vascular: pulses palpable throughout    Labs on Admission:  Basic Metabolic Panel: No results for input(s): NA, K, CL, CO2, GLUCOSE, BUN, CREATININE, CALCIUM, MG, PHOS in the last 168 hours. Liver Function Tests: No results for input(s): AST, ALT, ALKPHOS, BILITOT, PROT, ALBUMIN in the last 168 hours. No results for input(s): LIPASE, AMYLASE in the last 168 hours. No results for input(s): AMMONIA in the last 168 hours. CBC:  Recent Labs Lab 01/29/16 1846  WBC 10.4  NEUTROABS 8.2*  HGB 15.0  HCT 44.9  MCV 98.7  PLT 222   Cardiac Enzymes: No results for input(s): CKTOTAL, CKMB, CKMBINDEX, TROPONINI in the last 168 hours.  BNP (last 3 results)  Recent Labs  12/20/15 2120  BNP 29.5    ProBNP (last 3 results) No results for input(s): PROBNP in the last 8760 hours.  CBG: No results for input(s): GLUCAP in the last 168 hours.  Radiological Exams on Admission: Dg Chest Port 1 View  01/29/2016  CLINICAL DATA:  Diaphoretic and tachycardic. EXAM: PORTABLE CHEST 1 VIEW COMPARISON:  12/31/2015 FINDINGS: The patient is rotated to the left and kyphotic. Lung volumes low. There is probably a degree of collapse in the left lung. Right hilar nodularity is stable. Diffuse interstitial and alveolar opacity with scattered bilateral pulmonary nodules evident. Pulmonary edema cannot be excluded. Left Port-A-Cath tip overlies the mid SVC level. Telemetry leads overlie the chest. IMPRESSION: The lung volumes with bilateral pulmonary nodules in basilar atelectasis/ infiltrate. Question pulmonary edema. Electronically Signed   By: Misty Stanley M.D.   On:  01/29/2016 18:24      Assessment/Plan:   60 y.o. female with  Active Problems:   1.     Respiratory distress   NCO2   Monitor O2 sats  Albuterol Nebs PRN     2.     Metastatic Breast Cancer   Social Work/CaseManagement for Placement   Palliative Cae Consultation     3.     Right Breast Cancer Wound   Wound Care     3.     Seizure disorder   Continue Keppra Rx     4.     Essential HTN   Monitor BPs       5.     DVT Prophylaxis   Lovenox     Code Status:     DO NOT RESUSCITATE (DNR)      Family Communication:   Husband at Bedside    Disposition Plan:    Observation Status        Time spent:  36 Minutes      Theressa Millard [facility] Pager [phone]   If [place] Please Contact the Day Rounding Team MD for [facility]  If 7PM-7AM, Please Contact Night-Floor Coverage  [email or web address] Password [facility] 01/29/2016, 8:39 PM     ADDENDUM:   Patient was seen and examined on 01/29/2016

## 2016-01-29 NOTE — ED Notes (Signed)
Hospice Provider  To be called in the morning: [phone]

## 2016-01-29 NOTE — Progress Notes (Signed)
Patient recently admitted and discharged on 01/05, patient sent to [facility].  Patient brought to ED from home this evening with complaints of shortness of breath. Patient currently on 8 liters of oxygen.  EDCM spoke to [facility] hospice RN with [facility].  Per Marzetta Board, patient's husband thought he could manage the patient at home or patient's symptoms became more manageable.  Marzetta Board reports there has been more discussion about taking patient back to [facility].  Marzetta Board reports she will alert on call staff that patient is in the ED.  If patient is admitted, [facility] will follow.

## 2016-01-29 NOTE — ED Provider Notes (Signed)
CSN: [id number]     Arrival date & time 01/29/16  1743 History   First MD Initiated Contact with Patient 01/29/16 1745     Chief Complaint  Patient presents with  . Respiratory Distress   HPI  Patient presents with her sister who (the history of present illness. Level V caveat secondary to acuity of condition. The patient's sister states that over the past day the patient has become increasingly weak, with increased work of breathing, decreased interactivity.  The patient herself answers questions only intermittently, inconsistently, briefly. Patient has history of metastatic breast cancer, was at hospice care, inpatient, ~1 week ago. At that point transferred home, has been receiving care by her husband, sister.  I also talked with EMS providers on arrival to obtain elements of the history of present illness.    Past Medical History  Diagnosis Date  . Arthritis   . Depression   . Fainting   . Headache   . Hypertension   . Migraines   . History of blood transfusion   . Gastric ulcer   . Breast cancer metastasized to multiple sites [facility]) 04/12/2014    10/30/15 radiation, chemotherapy  . Seizures [facility])    Past Surgical History  Procedure Laterality Date  . Abdominal hysterectomy  15 years go    partial   Family History  Problem Relation Age of Onset  . Arthritis Other   . Hypertension Mother   . Hypertension Other   . Heart disease Other   . Stroke Father   . Cancer Sister     leukemia   Social History  Substance Use Topics  . Smoking status: Never Smoker   . Smokeless tobacco: Never Used     Comment: never used tobacco  . Alcohol Use: No   OB History    No data available     Review of Systems  Unable to perform ROS: Acuity of condition      Allergies  Hydrocodone; Penicillins; and Aspirin  Home Medications   Prior to Admission medications   Medication Sig Start Date End Date Taking? Authorizing Provider  albuterol (PROVENTIL) (2.5 MG/3ML) 0.083%  nebulizer solution Take 3 mLs (2.5 mg total) by nebulization every 2 (two) hours as needed for wheezing. 01/02/16  Yes Donne Hazel, MD  dexamethasone (DECADRON) 2 MG tablet Take 1 tablet (2 mg total) by mouth every 12 (twelve) hours. 11/10/15  Yes Nita Sells, MD  feeding supplement, ENSURE ENLIVE, (ENSURE ENLIVE) LIQD Take 237 mLs by mouth 2 (two) times daily between meals. 11/10/15  Yes Nita Sells, MD  haloperidol (HALDOL) 0.5 MG tablet Take 1 tablet (0.5 mg total) by mouth every 4 (four) hours as needed for agitation (or delirium). 01/02/16  Yes Donne Hazel, MD  Morphine Sulfate (MORPHINE CONCENTRATE) 10 mg / 0.5 ml concentrated solution GIVE 0.5-1MLS EVERY 2HRS AS NEEDED FOR PAIN/SHORTNESS OF BREATH 01/17/16  Yes Historical Provider, MD  oxyCODONE (ROXICODONE) 5 MG/5ML solution Take 5 mLs (5 mg total) by mouth every 2 (two) hours as needed for moderate pain (or dyspnea). 01/02/16  Yes Donne Hazel, MD  polyethylene glycol [facility]) packet Take 17 g by mouth daily. 12/23/15  Yes Verlee Monte, MD  polyvinyl alcohol (LIQUIFILM TEARS) 1.4 % ophthalmic solution Place 1 drop into both eyes 4 (four) times daily as needed for dry eyes. 01/02/16  Yes Donne Hazel, MD  senna (SENOKOT) 8.6 MG TABS tablet Take 1 tablet (8.6 mg total) by mouth at bedtime as needed  for mild constipation. 01/02/16  Yes Donne Hazel, MD  tobramycin (TOBREX) 0.3 % ophthalmic solution Place 2 drops into the right eye every 6 (six) hours. 01/23/16 03/16/16 Yes Volanda Napoleon, MD  bisacodyl (DULCOLAX) 10 MG suppository Place 10 mg rectally as needed for moderate constipation.    Historical Provider, MD  [facility] SENNA 8.6 MG tablet Take 1 tablet by mouth daily.  12/08/15   Historical Provider, MD  Dextromethorphan-Guaifenesin (DM MAX MAXIMUM STRENGTH) 5-100 MG/5ML LIQD Take 20 mLs by mouth every 4 (four) hours as needed (cough/congestion.).    Historical Provider, MD  fluconazole (DIFLUCAN) 100 MG tablet Take 1 tablet (100  mg total) by mouth daily. Patient not taking: Reported on 01/29/2016 12/23/15   Verlee Monte, MD  gabapentin (NEURONTIN) 300 MG capsule Take 1 capsule (300 mg total) by mouth 3 (three) times daily. Patient not taking: Reported on 01/29/2016 08/28/15   Volanda Napoleon, MD  haloperidol (HALDOL) 2 MG/ML solution Place 0.3 mLs (0.6 mg total) under the tongue every 4 (four) hours as needed for agitation (or delirium). 01/02/16   Donne Hazel, MD  haloperidol lactate (HALDOL) 5 MG/ML injection Inject 0.1 mLs (0.5 mg total) into the vein every 4 (four) hours as needed (or delirium). 01/02/16   Donne Hazel, MD  insulin aspart (NOVOLOG FLEXPEN) 100 UNIT/ML FlexPen 0-9 Units, Subcutaneous, 3 times daily with meals CBG < 70: call MD CBG 70 - 120: 0 units CBG 121 - 150: 1 unit CBG 151 - 200: 2 units CBG 201 - 250: 3 units CBG 251 - 300: 5 units CBG 301 - 350: 7 units CBG 351 - 400: 9 units CBG > 400: call MD  Okay to substitute with different band at equivalent dosing 10/09/15   Shanker Kristeen Mans, MD  levETIRAcetam (KEPPRA) 500 MG tablet Take 1 tablet (500 mg total) by mouth 2 (two) times daily. Patient not taking: Reported on 01/29/2016 11/10/15   Nita Sells, MD  LORazepam (ATIVAN) 1 MG tablet Take 1 tablet (1 mg total) by mouth every 4 (four) hours as needed for anxiety. 01/02/16   Donne Hazel, MD  LORazepam (ATIVAN) 2 MG/ML concentrated solution Place 0.5 mLs (1 mg total) under the tongue every 4 (four) hours as needed for anxiety. 01/02/16   Donne Hazel, MD  LORazepam (ATIVAN) 2 MG/ML injection Inject 0.5 mLs (1 mg total) into the vein every 4 (four) hours as needed for anxiety. 01/02/16   Donne Hazel, MD  mineral oil enema Place 1 enema rectally as needed for severe constipation.    Historical Provider, MD  mometasone (ELOCON) 0.1 % cream Apply to lips 2x a day Patient taking differently: Apply 1 application topically daily as needed (irritation). Apply to lips 2x a day 11/15/15   Volanda Napoleon, MD  nystatin (MYCOSTATIN) 100000 UNIT/ML suspension Take 5 mLs (500,000 Units total) by mouth 4 (four) times daily as needed (mouth pain). 11/10/15   Nita Sells, MD  ondansetron (ZOFRAN) 4 MG/2ML SOLN injection Inject 2 mLs (4 mg total) into the vein every 6 (six) hours as needed for nausea. 01/02/16   Donne Hazel, MD  ondansetron (ZOFRAN-ODT) 4 MG disintegrating tablet Take 1 tablet (4 mg total) by mouth every 6 (six) hours as needed for nausea. 01/02/16   Donne Hazel, MD  oxyCODONE (ROXICODONE INTENSOL) 20 MG/ML concentrated solution Place 0.3 mLs (6 mg total) under the tongue every 2 (two) hours as needed for moderate pain (  or dyspnea). 01/02/16   Donne Hazel, MD   BP 138/95 mmHg  Pulse 128  Temp(Src) 98.6 F (37 C) (Oral)  Resp 23  SpO2 99% Physical Exam  Constitutional:  Very ill appearing female listless, answering questions intermittently, briefly, with minimal clarity  HENT:  Head: Normocephalic and atraumatic.  Eyes: Conjunctivae are normal.  Cardiovascular: Tachycardia present.   Pulmonary/Chest: Accessory muscle usage present. Tachypnea noted. She is in respiratory distress. She has decreased breath sounds. She has wheezes. She has rhonchi.  Abdominal: There is tenderness. There is guarding.  Musculoskeletal:  No obvious deformities, bandaging over the right lateral breast  Neurological:  Minimal motion of all extremities spontaneously, patient does not follow neurologic commands, speech is inconsistent, mumbled  Psychiatric: She is withdrawn. Cognition and memory are impaired.  Nursing note and vitals reviewed.   ED Course  Procedures (including critical care time) Labs Review Labs Reviewed  I-STAT CG4 LACTIC ACID, ED - Abnormal; Notable for the following:    Lactic Acid, Venous 4.00 (*)    All other components within normal limits  COMPREHENSIVE METABOLIC PANEL  LIPASE, BLOOD  CBC WITH DIFFERENTIAL/PLATELET  URINALYSIS, ROUTINE W REFLEX  MICROSCOPIC (NOT AT [facility])    Imaging Review Dg Chest Port 1 View  01/29/2016  CLINICAL DATA:  Diaphoretic and tachycardic. EXAM: PORTABLE CHEST 1 VIEW COMPARISON:  12/31/2015 FINDINGS: The patient is rotated to the left and kyphotic. Lung volumes low. There is probably a degree of collapse in the left lung. Right hilar nodularity is stable. Diffuse interstitial and alveolar opacity with scattered bilateral pulmonary nodules evident. Pulmonary edema cannot be excluded. Left Port-A-Cath tip overlies the mid SVC level. Telemetry leads overlie the chest. IMPRESSION: The lung volumes with bilateral pulmonary nodules in basilar atelectasis/ infiltrate. Question pulmonary edema. Electronically Signed   By: Misty Stanley M.D.   On: 01/29/2016 18:24   I have personally reviewed and evaluated these images and lab results as part of my medical decision-making.   EKG Interpretation   Date/Time:  Wednesday January 29 2016 18:05:49 EST Ventricular Rate:  128 PR Interval:  139 QRS Duration: 95 QT Interval:  324 QTC Calculation: 473 R Axis:   49 Text Interpretation:  Sinus tachycardia Low voltage, extremity leads Sinus  tachycardia T wave abnormality Abnormal ekg Confirmed by Carmin Muskrat   MD [phone]) on 01/29/2016 6:21:05 PM      Soon after the patient's arrival I discussed her case w case management and hospice services.  On cardiac monitor the patient's heart rate 130, 140, sinus tach abnormal Pulse oximetry is 98% with 8 L nasal cannula, abnormal This is substantial increased oxygen requirement from prior evaluations.   Initial lactic acidosis of 4.  Patient received fluid resuscitation empirically, will continue to do so.  Chart review notable for recent discharge from this facility directly to inpatient hospice.  On repeat exam the patient remains in similar condition. The patient's husband is now here, and again we discussed the patient's history, prognosis.  There is no available  place at inpatient hospice, the patient will require admission given her new oxygen requirement.   Patient found to have UTI possibly contributing to tachycardia lactic acidosis, the patient remains afebrile. MDM  Patient with metastatic breast cancer presents from home.  Here, the patient is listless, and physical exam findings, labs, x-ray demonstrating progression of disease with ongoing respiratory distress.   Given the patient's initial lactic acidosis, tachycardia, she received fluid resuscitation in addition to her supplemental oxygen,  now 8 L via facemask.  I discussed patient's case with our hospice, social work Medical laboratory scientific officer and finally with the hospitalist team.   .CRITICAL CARE Performed by: Carmin Muskrat Total critical care time: 45 minutes Critical care time was exclusive of separately billable procedures and treating other patients. Critical care was necessary to treat or prevent imminent or life-threatening deterioration. Critical care was time spent personally by me on the following activities: development of treatment plan with patient and/or surrogate as well as nursing, discussions with consultants, evaluation of patient's response to treatment, examination of patient, obtaining history from patient or surrogate, ordering and performing treatments and interventions, ordering and review of laboratory studies, ordering and review of radiographic studies, pulse oximetry and re-evaluation of patient's condition.    Carmin Muskrat, MD 01/29/16 [phone]

## 2016-01-29 NOTE — ED Notes (Signed)
Hospice care contacted; no answer

## 2016-01-30 ENCOUNTER — Encounter: Payer: Self-pay | Admitting: Radiation Oncology

## 2016-01-30 DIAGNOSIS — R0600 Dyspnea, unspecified: Secondary | ICD-10-CM | POA: Diagnosis not present

## 2016-01-30 DIAGNOSIS — C50911 Malignant neoplasm of unspecified site of right female breast: Secondary | ICD-10-CM | POA: Diagnosis not present

## 2016-01-30 LAB — COMPREHENSIVE METABOLIC PANEL
ALT: 365 U/L — ABNORMAL HIGH (ref 14–54)
ANION GAP: 16 — AB (ref 5–15)
AST: 492 U/L — AB (ref 15–41)
Albumin: 2.7 g/dL — ABNORMAL LOW (ref 3.5–5.0)
Alkaline Phosphatase: 877 U/L — ABNORMAL HIGH (ref 38–126)
BILIRUBIN TOTAL: 2.8 mg/dL — AB (ref 0.3–1.2)
BUN: 19 mg/dL (ref 6–20)
CO2: 19 mmol/L — ABNORMAL LOW (ref 22–32)
Calcium: 9.1 mg/dL (ref 8.9–10.3)
Chloride: 109 mmol/L (ref 101–111)
Creatinine, Ser: 0.62 mg/dL (ref 0.44–1.00)
Glucose, Bld: 188 mg/dL — ABNORMAL HIGH (ref 65–99)
POTASSIUM: 3.6 mmol/L (ref 3.5–5.1)
Sodium: 144 mmol/L (ref 135–145)
TOTAL PROTEIN: 5.8 g/dL — AB (ref 6.5–8.1)

## 2016-01-30 LAB — LIPASE, BLOOD: LIPASE: 19 U/L (ref 11–51)

## 2016-01-30 MED ORDER — ENOXAPARIN SODIUM 40 MG/0.4ML ~~LOC~~ SOLN: 40.0000 mg | SUBCUTANEOUS | Status: DC

## 2016-01-30 NOTE — Clinical Social Work Note (Signed)
Clinical Social Work Assessment  Patient Details  Name: Mary [name] MRN: [id number] Date of Birth: Aug 08, 1956  Date of referral:  01/30/16               Reason for consult:  Discharge Planning                Permission sought to share information with:  Family Supports Permission granted to share information::  Yes, Verbal Permission Granted  Name::     Mary [name]  Agency::     Relationship::  spouse  Contact Information:  [phone]  Housing/Transportation Living arrangements for the past 2 months:  Single Family Home (was at [facility] one week ago and pt family took pt home against advice from [employer] per chart) Source of Information:  Spouse Patient Interpreter Needed:  None Criminal Activity/Legal Involvement Pertinent to Current Situation/Hospitalization:  No - Comment as needed Significant Relationships:  Spouse, Siblings Lives with:  Spouse Do you feel safe going back to the place where you live?  No Need for family participation in patient care:  Yes (Comment)  Care giving concerns:  Pt from home with spouse. Per chart, pt was at [facility] one week ago, but pt family took pt home against [employer]. Pt came to ED last night with respiratory distress and pt family requested transfer back to [facility]   Social Worker assessment / plan:  CSW received referral for transfer to [employer].  CSW reviewed chart and noted ED social worker attempted to get pt into [facility] last night, but was told pt could not transfer last night, but could transfer today.  CSW spoke with pt husband who confirmed that he wishes for pt to go back to [facility].  CSW spoke with [facility] RN liaison, Carolan Shiver who is checking on status of bed at [facility] and will notify CSW of bed availablity.  CSW to continue to follow.  Employment status:  Disabled (Comment on whether or not currently receiving Disability) Insurance information:  Medicaid In [place] [facility]  Recommendations:    Information / Referral to community resources:  Other (Comment Required) (referral to [facility])  Patient/Family's Response to care:  Pt unable to participate in assessment. Pt spouse states that he was told pt had a bed at [facility] today. Awaiting confirmation from [facility] to assist with transfer.  Patient/Family's Understanding of and Emotional Response to Diagnosis, Current Treatment, and Prognosis:  Pt spouse in agreement with pt going back to [facility].  Emotional Assessment Appearance:  Appears stated age Attitude/Demeanor/Rapport:  Unable to Assess Affect (typically observed):  Unable to Assess Orientation:    Alcohol / Substance use:    Psych involvement (Current and /or in the community):  No (Comment)  Discharge Needs  Concerns to be addressed:  Discharge Planning Concerns Readmission within the last 30 days:    Current discharge risk:  Terminally ill Barriers to Discharge:  No Barriers Identified   [place], [place], LCSW 01/30/2016, 10:01 AM  [phone]

## 2016-01-30 NOTE — Discharge Instructions (Signed)
-   Management as per [facility] place

## 2016-01-30 NOTE — Progress Notes (Signed)
[place] [facility] RN Visit  This is a related admission to [facility] diagnosis of Breast CA.  Patient is a DNR.  Patient seen in room sleeping soundly.  She is on 4 L [place] and her respirations are labored with accessory muscle usage present.  She does not respond to verbal stimuli.  Patient has received 3 doses of 5 mg Roxanol in the past 24 hours.  Patient was recently at [facility], where the family decided to take her home AMA.  She was transferred to home with the services of [facility] at that time. Family is now wishing for patient to transfer back to [facility] for comfort measures only and EOL care.  [facility] has bed availability for today and would like to welcome her back as soon as possible.    RN please call report to [phone].   Please arrange for transport as soon as possible.  Thank you, [facility] Liaison [phone]

## 2016-01-30 NOTE — Progress Notes (Signed)
Report called to Angela, RN at [facility]. 

## 2016-01-30 NOTE — Discharge Summary (Addendum)
Mary [name], is a 60 y.o. female  DOB Feb 12, 1956  MRN [id number].  Admission date:  01/29/2016  Admitting Physician  Theressa Millard, MD  Discharge Date:  01/30/2016   Primary MD  Sandi Mariscal, MD  Recommendations for primary care physician for things to follow:  - Management as per [facility] place   Admission Diagnosis  Respiratory distress [R06.00]   Discharge Diagnosis  Respiratory distress [R06.00]    Principal Problem:   Respiratory distress Active Problems:   Hypertension   Breast cancer metastasized to multiple sites [facility])   Wound of right breast   Seizure ([place])   Metastatic cancer to brain [facility])   Acute respiratory failure with hypoxia [facility])      Past Medical History  Diagnosis Date  . Arthritis   . Depression   . Fainting   . Headache   . Hypertension   . Migraines   . History of blood transfusion   . Gastric ulcer   . Breast cancer metastasized to multiple sites [facility]) 04/12/2014    10/30/15 radiation, chemotherapy  . Seizures [facility])     Past Surgical History  Procedure Laterality Date  . Abdominal hysterectomy  15 years go    partial       History of present illness and  Hospital Course:     Kindly see H&P for history of present illness and admission details, please review complete Labs, Consult reports and Test reports for all details in brief  HPI  from the history and physical done on the day of admission 01/29/2016 HPI: Mary [name] is a 60 y.o. female with a history of Metastatic Breast Cancer no longer on Cancer treatments and currently on home hospice care who was brought to the ED due to increased SOB and respiratory distress. She was found to have hypoxia and was placed on 8 liters NCO2. She was referred for admission and possible placement to [facility] place.    Hospital Course   Patient with known metastatic breast cancer, was that they can  place, family signed her AMA last week, family was significant deterioration in respiratory and mental status, came back to ED yesterday, as he could not take care of her, requesting to go back to [facility] place, patient was seen by social work for service, reevaluated with [facility] place, accepted to go back into facility. Continue with home medication including oxygen as needed, albuterol as needed, Keppra for seizure prophylaxis, continue Decadron.    Discharge prognosis: Less than 2 weeks.     Discharge Instructions  and  Discharge Medications     Discharge Instructions    Discharge instructions    Complete by:  As directed   - Management as per [facility] hospice place     Increase activity slowly    Complete by:  As directed             Medication List    STOP taking these medications        fluconazole 100 MG tablet  Commonly known as:  DIFLUCAN     haloperidol 0.5 MG tablet  Commonly known as:  HALDOL     insulin aspart 100 UNIT/ML FlexPen  Commonly known as:  NOVOLOG FLEXPEN      TAKE these medications        albuterol (2.5 MG/3ML) 0.083% nebulizer solution  Commonly known as:  PROVENTIL  Take 3 mLs (2.5 mg total) by nebulization every 2 (two) hours as needed for wheezing.     bisacodyl 10 MG suppository  Commonly known as:  DULCOLAX  Place 10 mg rectally as needed for moderate constipation.     [employer] SENNA 8.6 MG tablet  Generic drug:  senna  Take 1 tablet by mouth daily.     senna 8.6 MG Tabs tablet  Commonly known as:  SENOKOT  Take 1 tablet (8.6 mg total) by mouth at bedtime as needed for mild constipation.     dexamethasone 2 MG tablet  Commonly known as:  DECADRON  Take 1 tablet (2 mg total) by mouth every 12 (twelve) hours.     DM MAX MAXIMUM STRENGTH 5-100 MG/5ML Liqd  Generic drug:  Dextromethorphan-Guaifenesin  Take 20 mLs by mouth every 4 (four) hours as needed (cough/congestion.).     feeding supplement (ENSURE ENLIVE) Liqd  Take 237 mLs by  mouth 2 (two) times daily between meals.     gabapentin 300 MG capsule  Commonly known as:  NEURONTIN  Take 1 capsule (300 mg total) by mouth 3 (three) times daily.     haloperidol 2 MG/ML solution  Commonly known as:  HALDOL  Place 0.3 mLs (0.6 mg total) under the tongue every 4 (four) hours as needed for agitation (or delirium).     levETIRAcetam 500 MG tablet  Commonly known as:  KEPPRA  Take 1 tablet (500 mg total) by mouth 2 (two) times daily.     LORazepam 1 MG tablet  Commonly known as:  ATIVAN  Take 1 tablet (1 mg total) by mouth every 4 (four) hours as needed for anxiety.     LORazepam 2 MG/ML concentrated solution  Commonly known as:  ATIVAN  Place 0.5 mLs (1 mg total) under the tongue every 4 (four) hours as needed for anxiety.     mineral oil enema  Place 1 enema rectally as needed for severe constipation.     mometasone 0.1 % cream  Commonly known as:  ELOCON  Apply to lips 2x a day     morphine CONCENTRATE 10 mg / 0.5 ml concentrated solution  GIVE 0.5-1MLS EVERY 2HRS AS NEEDED FOR PAIN/SHORTNESS OF BREATH     nystatin 100000 UNIT/ML suspension  Commonly known as:  MYCOSTATIN  Take 5 mLs (500,000 Units total) by mouth 4 (four) times daily as needed (mouth pain).     ondansetron 4 MG disintegrating tablet  Commonly known as:  ZOFRAN-ODT  Take 1 tablet (4 mg total) by mouth every 6 (six) hours as needed for nausea.     ondansetron 4 MG/2ML Soln injection  Commonly known as:  ZOFRAN  Inject 2 mLs (4 mg total) into the vein every 6 (six) hours as needed for nausea.     oxyCODONE 5 MG/5ML solution  Commonly known as:  ROXICODONE  Take 5 mLs (5 mg total) by mouth every 2 (two) hours as needed for moderate pain (or dyspnea).     oxyCODONE 20 MG/ML concentrated solution  Commonly known as:  ROXICODONE INTENSOL  Place 0.3 mLs (6 mg total)  under the tongue every 2 (two) hours as needed for moderate pain (or dyspnea).     polyethylene glycol packet  Commonly  known as:  MIRALAX  Take 17 g by mouth daily.     polyvinyl alcohol 1.4 % ophthalmic solution  Commonly known as:  LIQUIFILM TEARS  Place 1 drop into both eyes 4 (four) times daily as needed for dry eyes.     tobramycin 0.3 % ophthalmic solution  Commonly known as:  TOBREX  Place 2 drops into the right eye every 6 (six) hours.          Diet and Activity recommendation: See Discharge Instructions above   Consults obtained -  None   Major procedures and Radiology Reports - PLEASE review detailed and final reports for all details, in brief -     Dg Chest Port 1 View  01/29/2016  CLINICAL DATA:  Diaphoretic and tachycardic. EXAM: PORTABLE CHEST 1 VIEW COMPARISON:  12/31/2015 FINDINGS: The patient is rotated to the left and kyphotic. Lung volumes low. There is probably a degree of collapse in the left lung. Right hilar nodularity is stable. Diffuse interstitial and alveolar opacity with scattered bilateral pulmonary nodules evident. Pulmonary edema cannot be excluded. Left Port-A-Cath tip overlies the mid SVC level. Telemetry leads overlie the chest. IMPRESSION: The lung volumes with bilateral pulmonary nodules in basilar atelectasis/ infiltrate. Question pulmonary edema. Electronically Signed   By: Misty Stanley M.D.   On: 01/29/2016 18:24   Dg Chest Port 1 View  12/31/2015  CLINICAL DATA:  Altered mental status.  History of breast carcinoma EXAM: PORTABLE CHEST 1 VIEW COMPARISON:  December 20, 2015 FINDINGS: There are bilateral pleural effusions. There is left mid lung consolidation, new. There is a right perihilar nodular lesion, consistent with a metastatic focus. Heart is upper normal in size. The pulmonary vascular is within normal limits. Prominence in the azygos region and superior right hilar region is concerning for adenopathy. There is a destructive lesion involving portions of the left ninth rib. Port-A-Cath tip is in the superior vena cava. No pneumothorax. IMPRESSION: Areas of  metastatic disease. Airspace consolidation left mid lung, new. Small pleural effusions present. No change in cardiac silhouette. Electronically Signed   By: Lowella Grip III M.D.   On: 12/31/2015 14:30    Micro Results   No results found for this or any previous visit (from the past 240 hour(s)).     Today   Subjective:   Mary [name] today lethargic, complains of pain. Objective:   Blood pressure 152/70, pulse 114, temperature 99 F (37.2 C), temperature source Axillary, resp. rate 20, SpO2 100 %.   Intake/Output Summary (Last 24 hours) at 01/30/16 1153 Last data filed at 01/30/16 0516  Gross per 24 hour  Intake      0 ml  Output    100 ml  Net   -100 ml    Exam Lethargic, confused, minimally communicative Supple Neck,No JVD, Symmetrical Chest wall movement, decreased air entry bilaterally, no wheezing RRR,No Gallops,Rubs  +ve B.Sounds, Abd Soft, Non tenderNo rebound -guarding or rigidity. No Cyanosis, Clubbing or edema, No new Rash or bruise  Data Review   CBC w Diff: Lab Results  Component Value Date   WBC 10.4 01/29/2016   WBC 9.3 12/06/2015   HGB 15.0 01/29/2016   HGB 10.0* 12/06/2015   HCT 44.9 01/29/2016   HCT 32.4* 12/06/2015   PLT 222 01/29/2016   PLT 294 12/06/2015   LYMPHOPCT 17 01/29/2016  LYMPHOPCT 14.7 12/06/2015   MONOPCT 4 01/29/2016   MONOPCT 12.1 12/06/2015   EOSPCT 0 01/29/2016   EOSPCT 0.1 12/06/2015   BASOPCT 0 01/29/2016   BASOPCT 0.1 12/06/2015    CMP: Lab Results  Component Value Date   NA 144 01/29/2016   NA 140 12/06/2015   NA 136 11/01/2015   K 3.6 01/29/2016   K 3.1* 12/06/2015   K 3.5 11/01/2015   CL 109 01/29/2016   CL 102 11/01/2015   CO2 19* 01/29/2016   CO2 21* 12/06/2015   CO2 22 11/01/2015   BUN 19 01/29/2016   BUN 13.9 12/06/2015   BUN 19 11/01/2015   CREATININE 0.62 01/29/2016   CREATININE 0.6 12/06/2015   CREATININE 0.4* 11/01/2015   PROT 5.8* 01/29/2016   PROT 5.1* 12/06/2015   PROT 5.3*  11/01/2015   ALBUMIN 2.7* 01/29/2016   ALBUMIN 2.1* 12/06/2015   ALBUMIN 2.0* 11/01/2015   BILITOT 2.8* 01/29/2016   BILITOT 0.60 12/06/2015   BILITOT 1.70* 11/01/2015   ALKPHOS 877* 01/29/2016   ALKPHOS 138 12/06/2015   ALKPHOS 272* 11/01/2015   AST 492* 01/29/2016   AST 45* 12/06/2015   AST 82* 11/01/2015   ALT 365* 01/29/2016   ALT 40 12/06/2015   ALT 272* 11/01/2015  .   Total Time in preparing paper work, data evaluation and todays exam - 25 minutes  ELGERGAWY, DAWOOD M.D on 01/30/2016 at 11:53 AM  [facility]   Office  [phone]

## 2016-01-30 NOTE — Progress Notes (Signed)
CSW received confirmation from [place], Freddi Starr that pt has a bed at [facility] for today.  CSW notified MD.  CSW facilitated pt discharge needs including faxing discharge summary, discussing with pt family, providing RN phone number to call report, and arranging ambulance transport for pt to [facility].  No further social work needs identified at this time.  CSW signing off.   Alison Murray, MSW, [place] [facility] [phone]

## 2016-01-30 NOTE — Progress Notes (Signed)
Attempted to draw blood from port for pt's lab work this morning. Port flushes fine, but unable to get blood return. Given that pt is hospice care, husband does not want to have phleblotomy stick pt for labs this morning and does not want port reaccessed just to try to get blood for labs. Hortencia Conradi RN

## 2016-01-31 LAB — URINE CULTURE: SPECIAL REQUESTS: NORMAL

## 2016-02-03 ENCOUNTER — Ambulatory Visit: Payer: Medicaid Other | Admitting: Diagnostic Neuroimaging

## 2016-02-05 ENCOUNTER — Encounter: Payer: Self-pay | Admitting: *Deleted

## 2016-02-05 ENCOUNTER — Telehealth: Payer: Self-pay | Admitting: *Deleted

## 2016-02-18 ENCOUNTER — Encounter: Payer: Self-pay | Admitting: Hematology & Oncology

## 2016-02-26 NOTE — Telephone Encounter (Signed)
Received notice from [facility] that patient passed away on Feb 28, 2016 at 6:40a  Dr Marin Olp notified.

## 2016-02-26 DEATH — deceased

## 2016-03-13 ENCOUNTER — Other Ambulatory Visit: Payer: Self-pay | Admitting: Nurse Practitioner

## 2016-05-08 IMAGING — DX DG LUMBAR SPINE COMPLETE 4+V
5 series · 5 of 5 positions shown · non-contrast
Comparison: CT scan dated 05/13/2015

CLINICAL DATA: Low back pain for several weeks. Metastatic breast
cancer.

EXAM:
LUMBAR SPINE - COMPLETE 4+ VIEW

[l-spine ap]
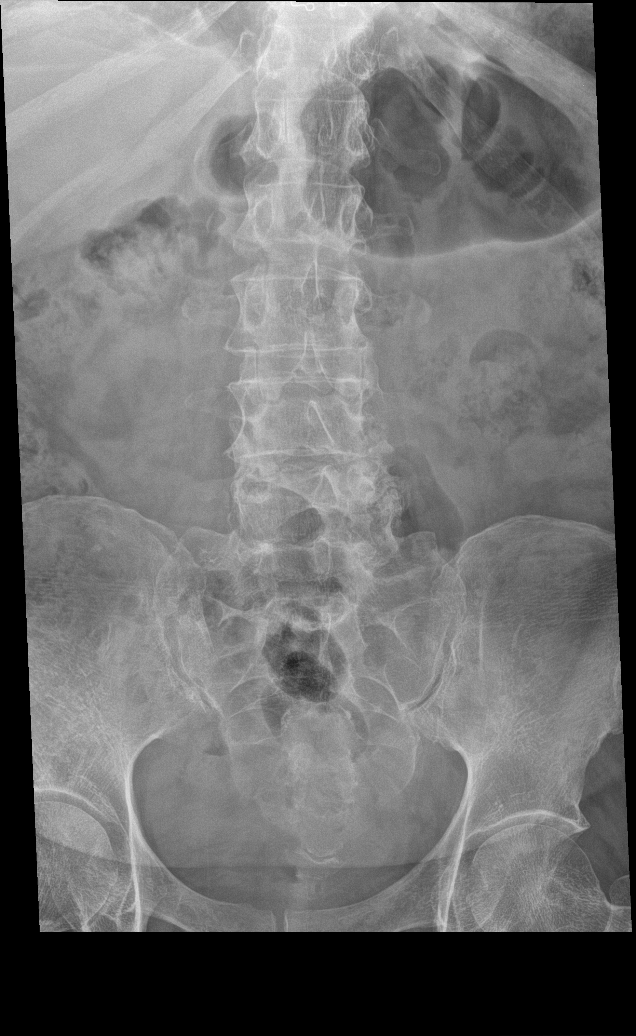

[l-spine obl (1 of 2)]
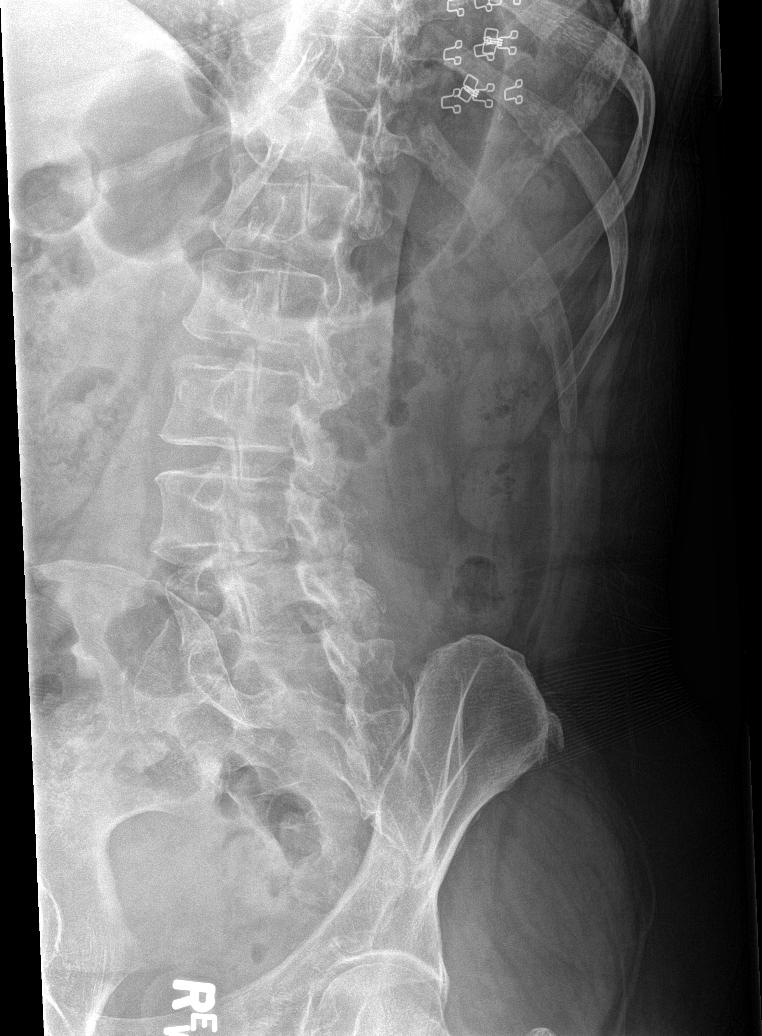

[l-spine obl (2 of 2)]
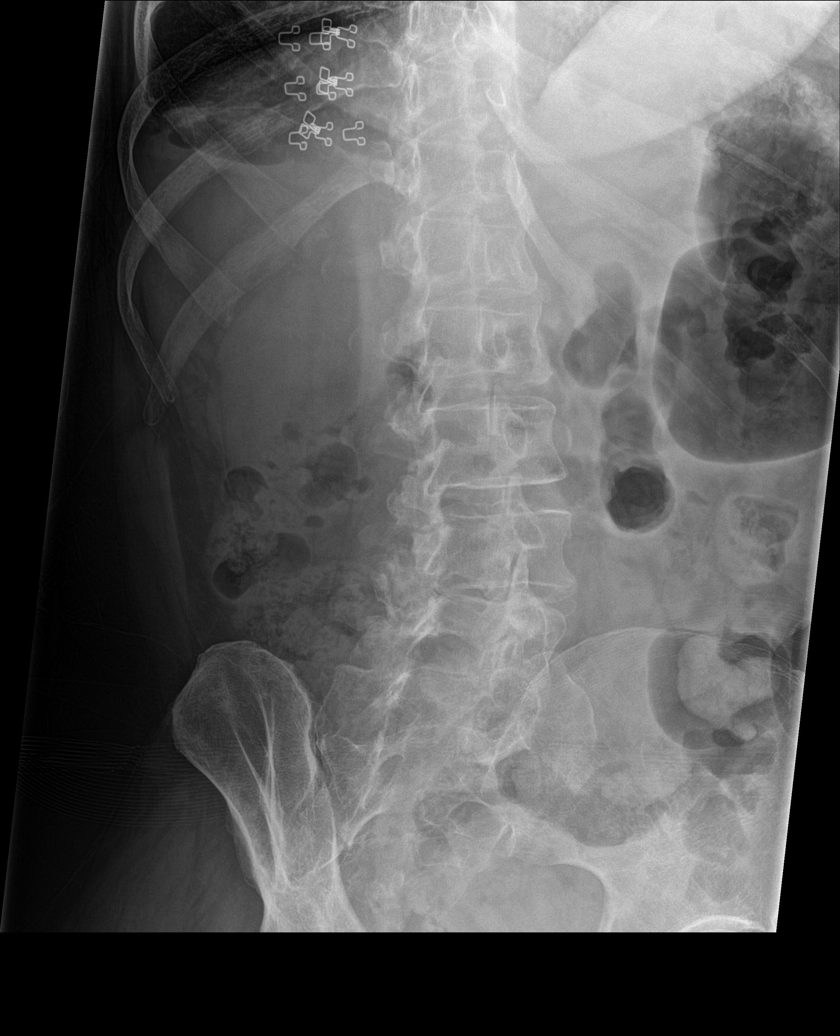

[l-spine lat]
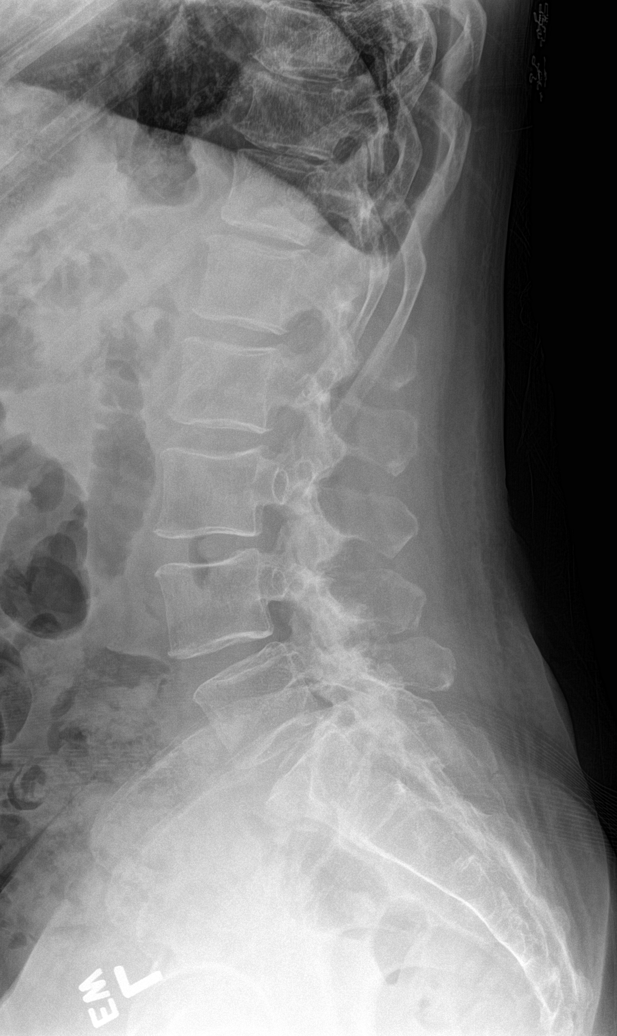

[l-spine spot]
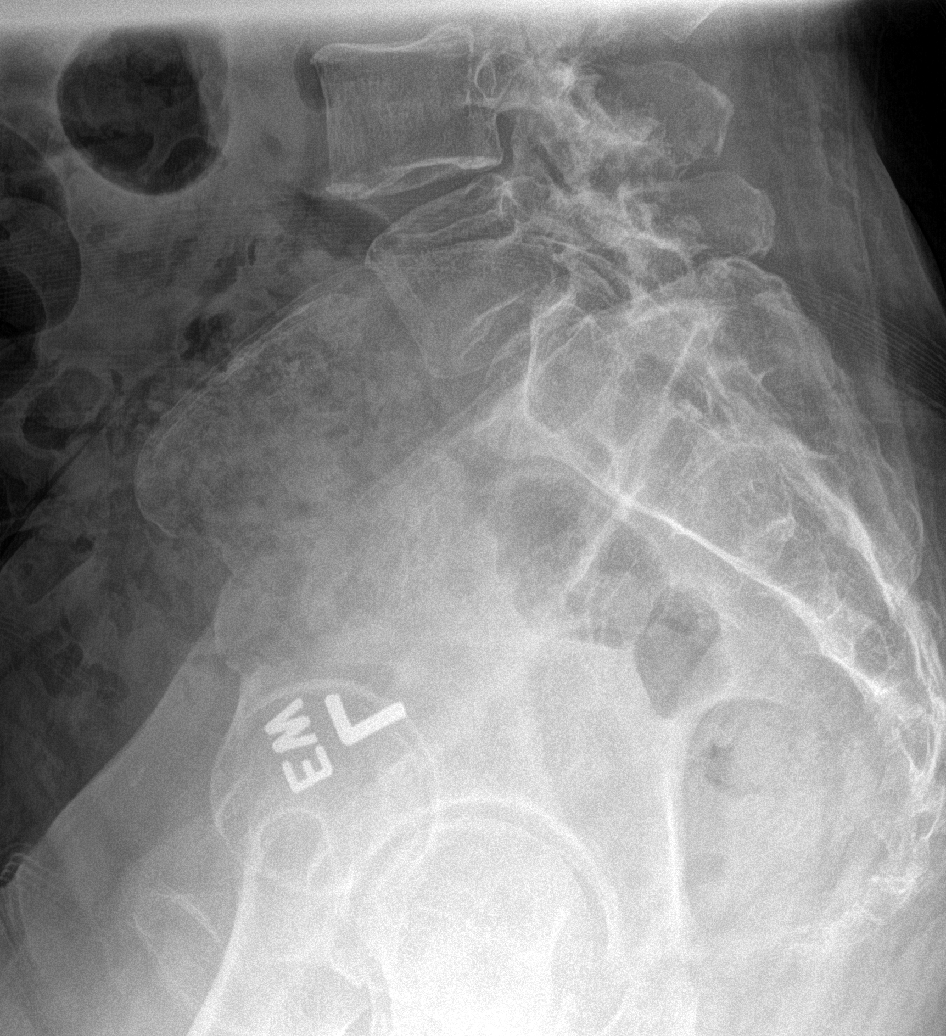

[5 of 5 positions shown; findings below may reference images not displayed]

FINDINGS: There is no fracture or bone destruction. The patient has severe
bilateral facet arthritis at L4-5, right greater than left and
severe left facet arthritis at L5-S1. Previous CT scan demonstrates
multiple blastic metastases in the lumbar spine. Old Schmorl's node
in the superior endplate of S1. No spondylolisthesis or
spondylolysis.
IMPRESSION: No acute abnormality. Severe facet arthritis in the lower lumbar
spine. Known blastic metastases in the lumbar spine.

## 2016-09-05 IMAGING — CR DG CHEST 2V
2 series · 2 of 2 positions shown · non-contrast
Comparison: 10/06/2015

CLINICAL DATA: Shortness of breath. Last radiation treatment for
breast cancer 4 days ago.

EXAM:
CHEST  2 VIEW

[w chest lat]
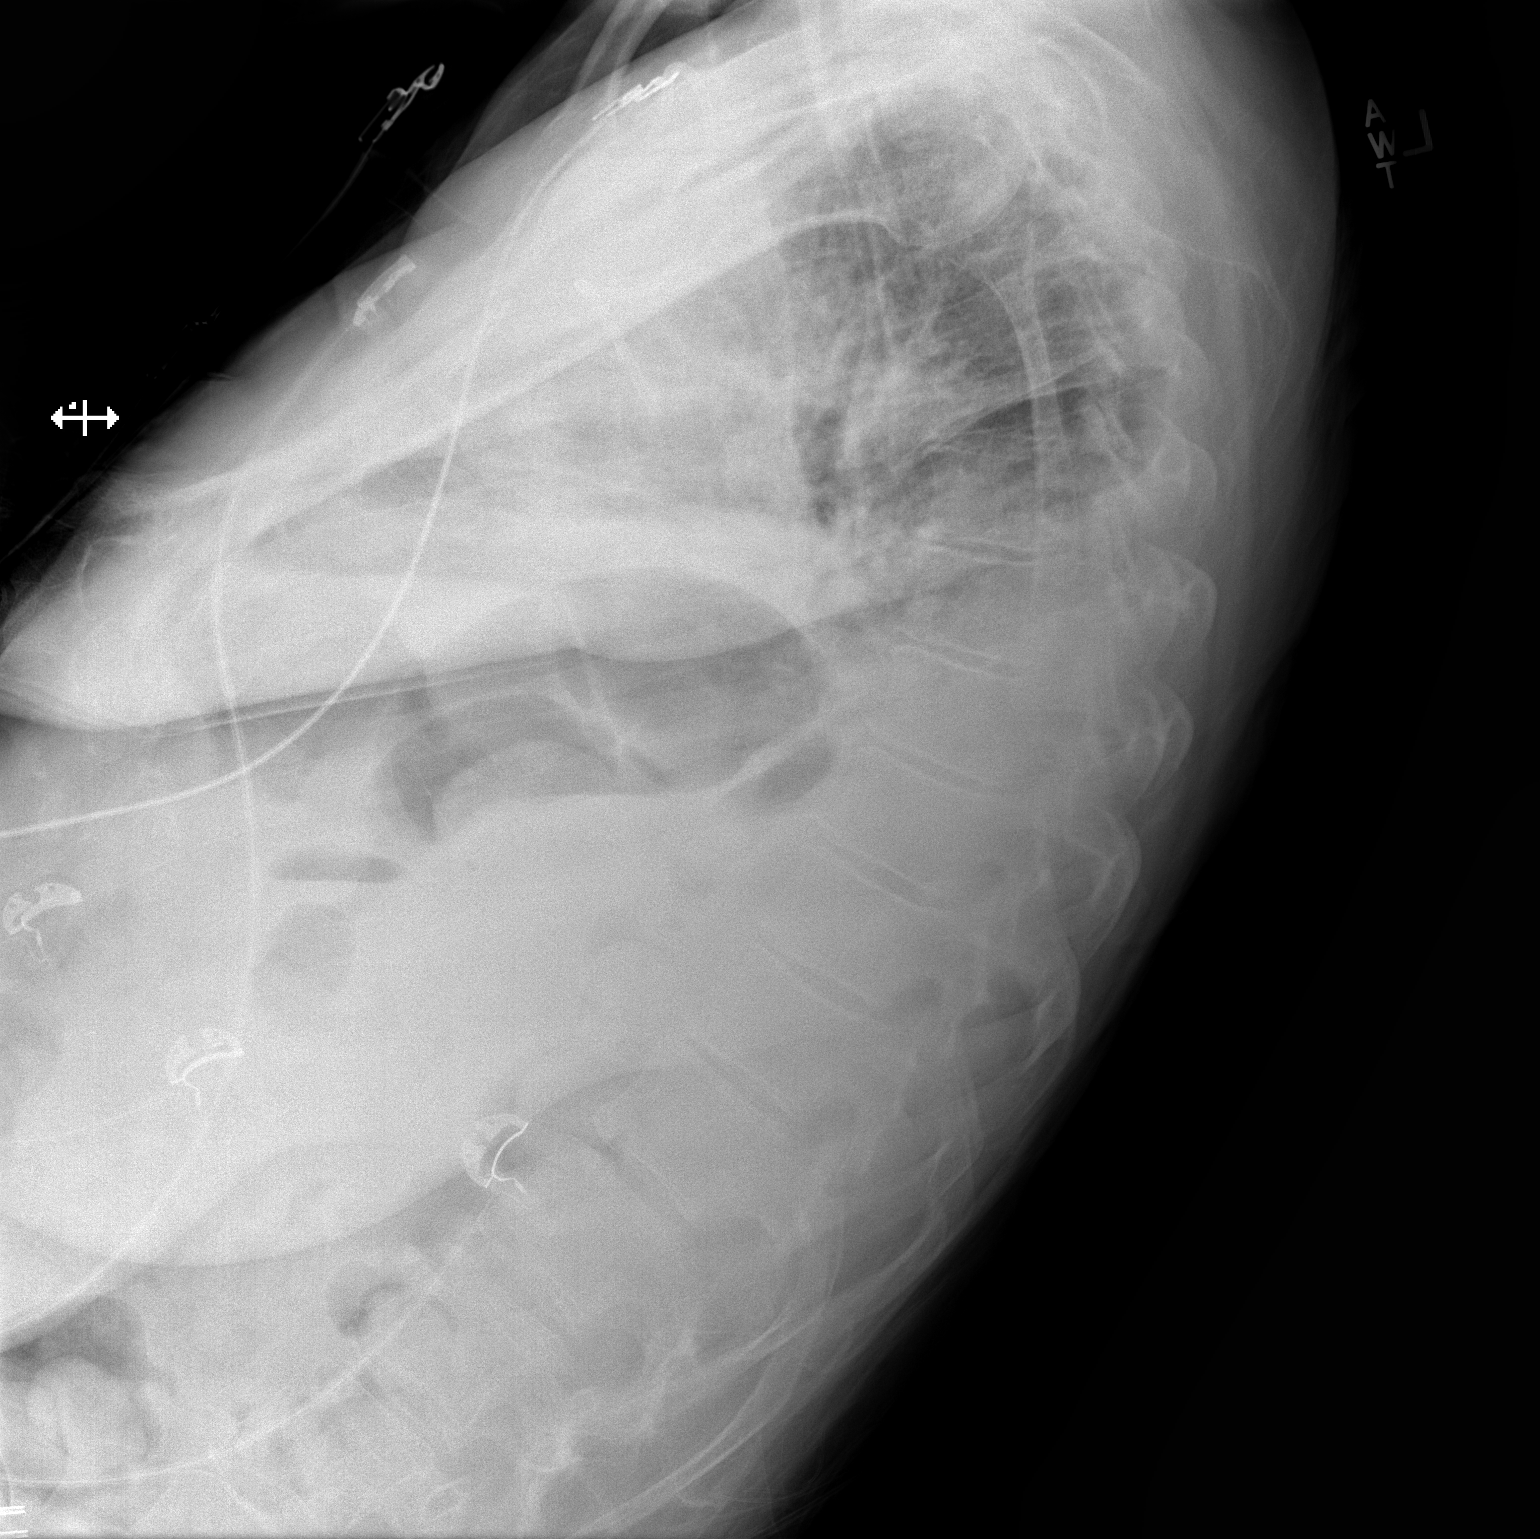

[x chest ap]
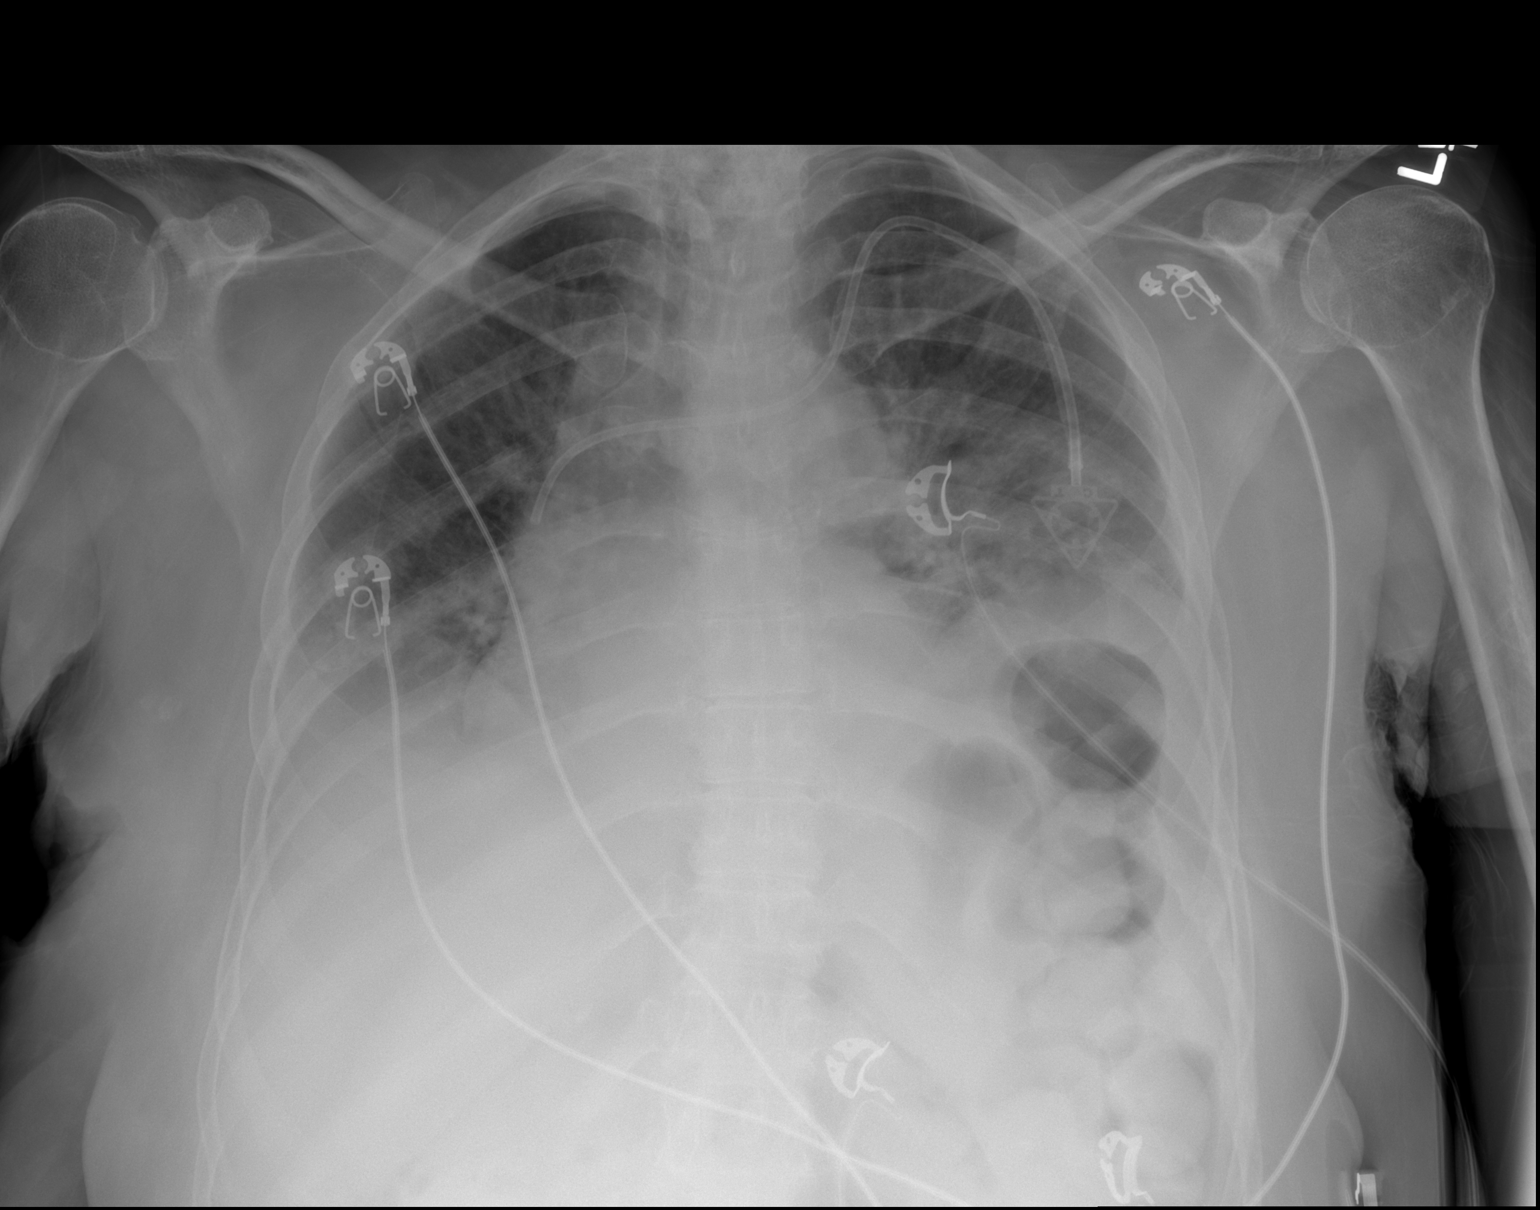

[2 of 2 positions shown; findings below may reference images not displayed]

FINDINGS: Left IJ Port-A-Cath unchanged. Lungs are hypoinflated with mild
elevation of the left hemidiaphragm unchanged. There is there is new
mild perihilar bibasilar airspace opacification
left-greater-than-right which may be due to edema versus infection.
Suggestion of small bilateral pleural effusions which are new.
Cardiomediastinal silhouette and remainder of the exam is unchanged.
IMPRESSION: New hazy airspace process over the perihilar bibasilar regions left
greater than right which may be due to edema versus infection. Small
bilateral effusions.

## 2016-10-14 ENCOUNTER — Other Ambulatory Visit: Payer: Self-pay

## 2016-10-14 ENCOUNTER — Ambulatory Visit: Payer: Self-pay | Admitting: Hematology & Oncology

## 2016-10-31 IMAGING — DX DG CHEST 1V PORT
1 series · 1 of 1 positions shown · non-contrast
Comparison: December 20, 2015

CLINICAL DATA: Altered mental status.  History of breast carcinoma

EXAM:
PORTABLE CHEST 1 VIEW

[chest ap]
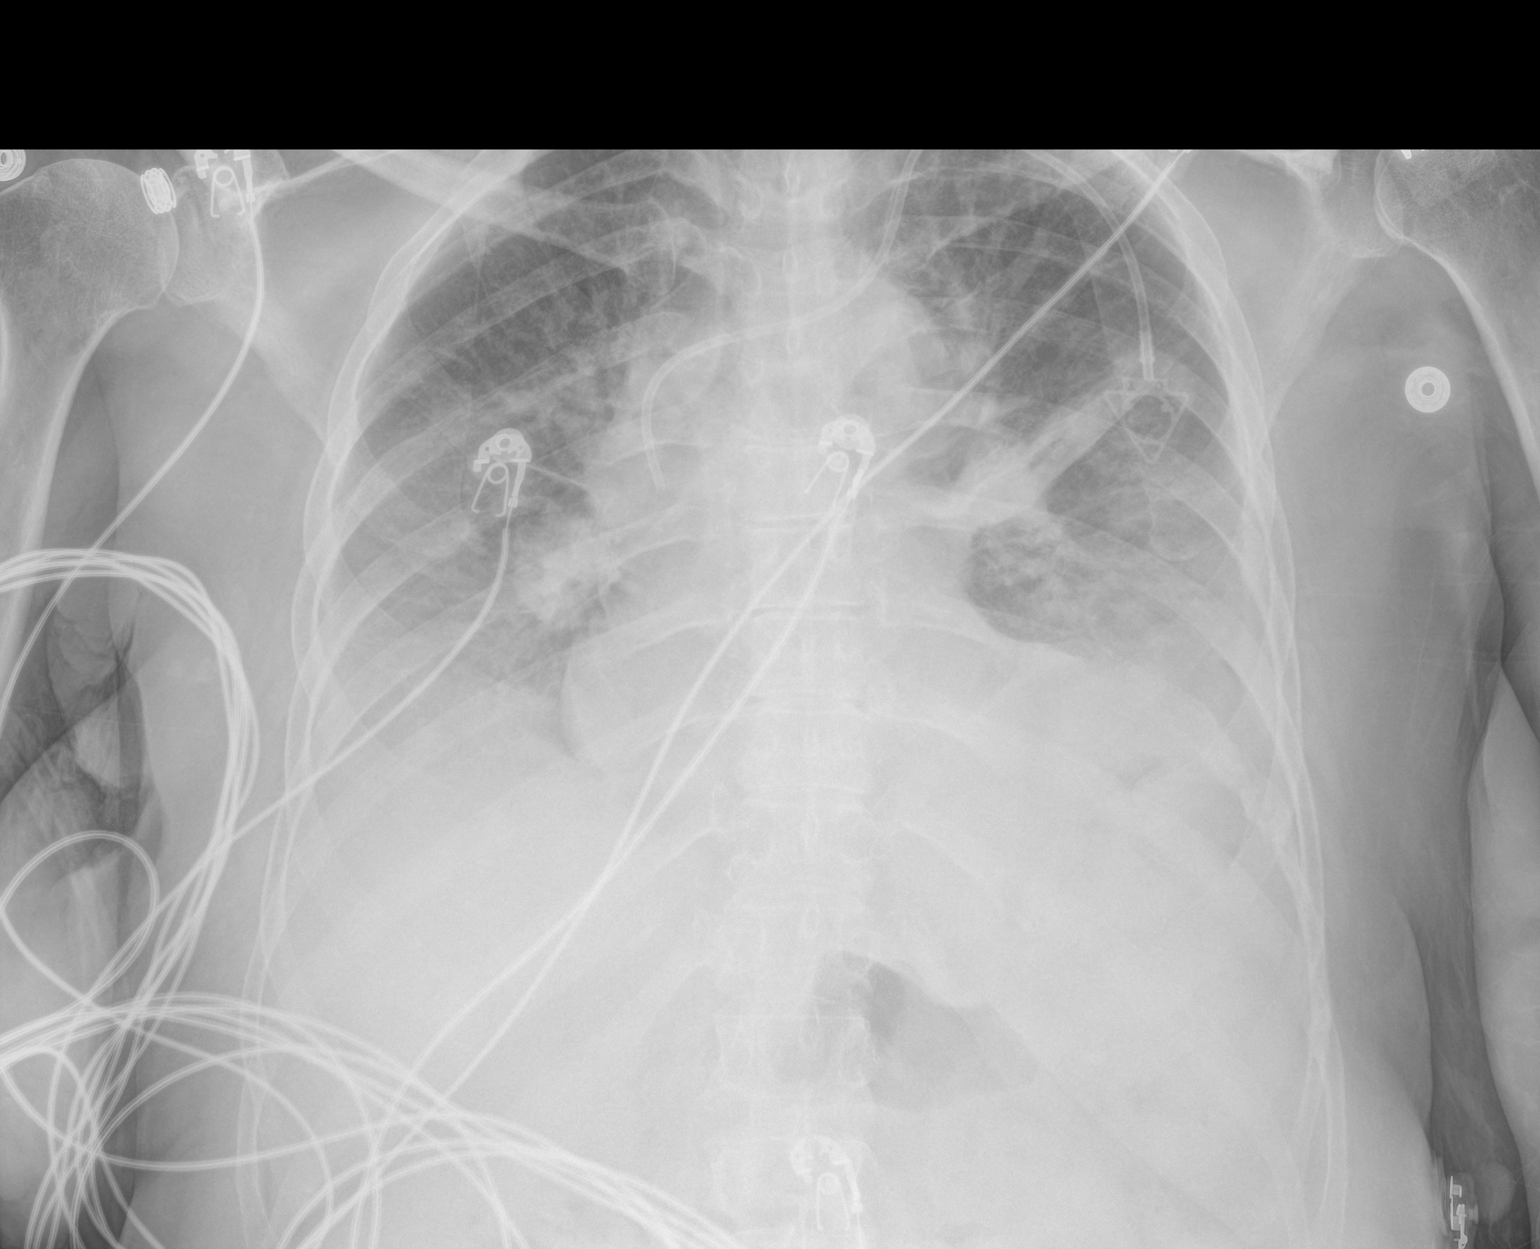

[1 of 1 positions shown; findings below may reference images not displayed]

FINDINGS: There are bilateral pleural effusions. There is left mid lung
consolidation, new. There is a right perihilar nodular lesion,
consistent with a metastatic focus. Heart is upper normal in size.
The pulmonary vascular is within normal limits. Prominence in the
azygos region and superior right hilar region is concerning for
adenopathy. There is a destructive lesion involving portions of the
left ninth rib. Port-A-Cath tip is in the superior vena cava. No
pneumothorax.
IMPRESSION: Areas of metastatic disease. Airspace consolidation left mid lung,
new. Small pleural effusions present. No change in cardiac
silhouette.

## 2023-02-15 ENCOUNTER — Other Ambulatory Visit (HOSPITAL_COMMUNITY): Payer: Self-pay
